# Patient Record
Sex: Female | Born: 1970
Health system: Southern US, Community
[De-identification: ages and names within clinical notes are randomized; demographics above are authoritative.]

## PROBLEM LIST (undated history)

## (undated) DIAGNOSIS — D219 Benign neoplasm of connective and other soft tissue, unspecified: Secondary | ICD-10-CM

## (undated) DIAGNOSIS — Q159 Congenital malformation of eye, unspecified: Secondary | ICD-10-CM

## (undated) DIAGNOSIS — F32A Depression, unspecified: Secondary | ICD-10-CM

## (undated) DIAGNOSIS — D649 Anemia, unspecified: Secondary | ICD-10-CM

## (undated) DIAGNOSIS — I10 Essential (primary) hypertension: Secondary | ICD-10-CM

## (undated) DIAGNOSIS — R519 Headache, unspecified: Secondary | ICD-10-CM

## (undated) DIAGNOSIS — F329 Major depressive disorder, single episode, unspecified: Secondary | ICD-10-CM

## (undated) DIAGNOSIS — R51 Headache: Secondary | ICD-10-CM

## (undated) HISTORY — DX: Headache, unspecified: R51.9

## (undated) HISTORY — DX: Congenital malformation of eye, unspecified: Q15.9

## (undated) HISTORY — DX: Depression, unspecified: F32.A

## (undated) HISTORY — DX: Anemia, unspecified: D64.9

## (undated) HISTORY — DX: Benign neoplasm of connective and other soft tissue, unspecified: D21.9

## (undated) HISTORY — DX: Essential (primary) hypertension: I10

## (undated) HISTORY — DX: Headache: R51

## (undated) HISTORY — PX: KNEE SURGERY: SHX244

## (undated) HISTORY — DX: Major depressive disorder, single episode, unspecified: F32.9

## (undated) HISTORY — PX: BREAST MASS EXCISION: SHX1267

---

## 2012-11-11 ENCOUNTER — Ambulatory Visit (INDEPENDENT_AMBULATORY_CARE_PROVIDER_SITE_OTHER): Payer: 59 | Admitting: Adult Health

## 2012-11-11 ENCOUNTER — Encounter: Payer: Self-pay | Admitting: Adult Health

## 2012-11-11 ENCOUNTER — Telehealth: Payer: Self-pay | Admitting: *Deleted

## 2012-11-11 ENCOUNTER — Other Ambulatory Visit: Payer: Self-pay | Admitting: Adult Health

## 2012-11-11 VITALS — BP 110/58 | HR 64 | Temp 98.1°F | Resp 12 | Ht 61.25 in | Wt 159.0 lb

## 2012-11-11 DIAGNOSIS — R5383 Other fatigue: Secondary | ICD-10-CM

## 2012-11-11 DIAGNOSIS — D649 Anemia, unspecified: Secondary | ICD-10-CM

## 2012-11-11 DIAGNOSIS — Z0000 Encounter for general adult medical examination without abnormal findings: Secondary | ICD-10-CM | POA: Insufficient documentation

## 2012-11-11 DIAGNOSIS — Z23 Encounter for immunization: Secondary | ICD-10-CM

## 2012-11-11 DIAGNOSIS — Z1239 Encounter for other screening for malignant neoplasm of breast: Secondary | ICD-10-CM

## 2012-11-11 DIAGNOSIS — D509 Iron deficiency anemia, unspecified: Secondary | ICD-10-CM

## 2012-11-11 DIAGNOSIS — R1013 Epigastric pain: Secondary | ICD-10-CM

## 2012-11-11 DIAGNOSIS — E611 Iron deficiency: Secondary | ICD-10-CM

## 2012-11-11 DIAGNOSIS — R5381 Other malaise: Secondary | ICD-10-CM

## 2012-11-11 LAB — LIPID PANEL
HDL: 58.3 mg/dL (ref >39.00)
Total CHOL/HDL Ratio: 3
Triglycerides: 71 mg/dL (ref 0.0–149.0)

## 2012-11-11 LAB — CBC WITH DIFFERENTIAL/PLATELET
Hemoglobin: 5.4 g/dL — CL (ref 12.0–15.0)
RBC: 3.29 Mil/uL — ABNORMAL LOW (ref 3.87–5.11)
RDW: 32.2 % — ABNORMAL HIGH (ref 11.5–14.6)
WBC: 3.9 10*3/uL — ABNORMAL LOW (ref 4.5–10.5)

## 2012-11-11 LAB — HEPATIC FUNCTION PANEL
Albumin: 3.5 g/dL (ref 3.5–5.2)
Bilirubin, Direct: 0 mg/dL (ref 0.0–0.3)
Total Protein: 7.3 g/dL (ref 6.0–8.3)

## 2012-11-11 LAB — BASIC METABOLIC PANEL
CO2: 24 mEq/L (ref 19–32)
Calcium: 9.1 mg/dL (ref 8.4–10.5)
Creatinine, Ser: 0.8 mg/dL (ref 0.4–1.2)
Glucose, Bld: 88 mg/dL (ref 70–99)

## 2012-11-11 LAB — TSH: TSH: 1.73 u[IU]/mL (ref 0.35–5.50)

## 2012-11-11 MED ORDER — ESOMEPRAZOLE MAGNESIUM 40 MG PO CPDR: 40.0000 mg | DELAYED_RELEASE_CAPSULE | Freq: Every day | ORAL | Status: DC

## 2012-11-11 MED ORDER — TETANUS-DIPHTH-ACELL PERTUSSIS 5-2.5-18.5 LF-MCG/0.5 IM SUSP: 0.5000 mL | Freq: Once | INTRAMUSCULAR | Status: DC

## 2012-11-11 NOTE — Telephone Encounter (Signed)
Critical Values:  Hemoglobin: 5.4  Hematocrit: 19.5  Platelet: 1264

## 2012-11-11 NOTE — Patient Instructions (Addendum)
   Thank you for choosing [place] at [facility] for your health care needs.  Please have your labs drawn prior to leaving the office.  The results will be available through MyChart for your convenience. Please remember to activate this. The activation code is located at the end of this form.  For your stomach - please start Nexium 40 mg every morning. Take 30 minutes prior to any food or drink.  If symptoms do not improve within 1 month please let me know.

## 2012-11-11 NOTE — Assessment & Plan Note (Signed)
No pain with deep palpation. Negative murphy's sign. Start nexium daily. If no improvement within 1 month will send for ultrasound. May need referral to GI.

## 2012-11-11 NOTE — Addendum Note (Signed)
Addended by: Chandra Batch E on: 11/11/2012 10:43 AM   Modules accepted: Orders

## 2012-11-11 NOTE — Assessment & Plan Note (Addendum)
Normal physical exam excluding breast and pelvic. Patient has appointment with [facility] tomorrow. Check labs: cbc w/diff, tsh, bmet, hepatic panel, lipids, vit d, ferritin. Request medical records from previous pcp.

## 2012-11-11 NOTE — Progress Notes (Signed)
Subjective:    Patient ID: Dana Dunlap, female    DOB: 04/09/1971, 42 y.o.   MRN: [id number]  HPI  Patient is a pleasant 42 y/o female who presents to clinic to establish care. She has recently moved to the [place] area from [place]. Patient reports having epigastric pain. She describes pain as a constant ache. She reports feeling like "the food is stuck". Pain is increased after meals. She reports that she had a barium swallow last year with negative findings. She reports episodes of n/v associated with this. She has not noticed any blood in the vomitus. Her episodes of n/v are occuring around her menstrual cycle. She has a hx of uterine fibroids. Appointment at [facility] scheduled for tomorrow. She reports stools are black. Patient takes iron supplements.    Past Medical History  Diagnosis Date  . Depression     Currently on Cymbalta  . Hypertension   . Frequent headaches     Worse during monthly menstrual cycle  . Fibroids      History reviewed. No pertinent past surgical history.   Family History  Problem Relation Age of Onset  . Hypertension Mother   . Hyperlipidemia Mother   . Cancer Father     Prostate cancer  . Hypertension Father   . Hyperlipidemia Father   . Stroke Maternal Aunt   . Stroke Paternal Aunt   . Diabetes Paternal Aunt      History   Social History  . Marital Status: Single    Spouse Name: N/A    Number of Children: N/A  . Years of Education: 16   Occupational History  . 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 Smoking status: Never Smoker   . Smokeless tobacco: Never Used  . Alcohol Use: Yes     Comment: occasional alcohol use  . Drug Use: No  . Sexually Active: Yes    Birth Control/ Protection: Condom   Other Topics Concern  . Not on file   Social History Narrative   Dana Dunlap is originally from [place]. She attended [employer] in [place], [place] where she obtained her  Scientist, water quality in Biology in 1995. 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 She is in a long term relationship with her boyfried, Dana Dunlap. They have been together for 6 years. Dana Dunlap transferred to [facility] with [employer] in January. She enjoys reading and she enjoys the outdoors. She loves to travel. 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      Review of Systems  Constitutional: Positive for fatigue. Negative for fever.  HENT: Positive for postnasal drip.   Eyes:       Eye dryness. Last eye exam 2 years ago. She is scheduling appointment for this.  Respiratory: Negative.   Cardiovascular: Negative.   Gastrointestinal: Positive for nausea, vomiting, abdominal pain and constipation. Negative for blood in stool.       Epigastric pain. Worse 1-2 hours after meals.  Endocrine: Negative.   Genitourinary: Positive for menstrual problem.       Fibroids - Appointment with [facility] tomorrow. Patient with hx of low iron s/p iron infusions.  Musculoskeletal: Negative.   Skin: Negative.   Neurological: Positive for headaches.  Hematological: Negative.   Psychiatric/Behavioral: Negative for suicidal ideas, behavioral problems, confusion, sleep disturbance, self-injury and agitation.       Takes  cymbalta for depression. Feels depression is well controlled.    BP 110/58  Pulse 64  Temp(Src) 98.1 F (36.7 C) (Oral)  Resp 12  Ht 5' 1.25" (1.556 m)  Wt 159 lb (72.122 kg)  BMI 29.79 kg/m2  SpO2 98%  LMP 11/01/2012    Objective:   Physical Exam  Constitutional: She is oriented to person, place, and time. She appears well-developed and well-nourished. No distress.  HENT:  Head: Normocephalic and atraumatic.  Right Ear: External ear normal.  Left Ear: External ear normal.  Nose: Nose normal.  Mouth/Throat: Oropharynx is clear and moist.  Eyes: Conjunctivae are normal. Pupils are equal, round, and reactive to light.   Neck: Normal range of motion. Neck supple. No tracheal deviation present. No thyromegaly present.  Cardiovascular: Normal rate, regular rhythm, normal heart sounds and intact distal pulses.  Exam reveals no gallop and no friction rub.   No murmur heard. Pulmonary/Chest: Effort normal and breath sounds normal. No respiratory distress. She has no wheezes. She has no rales.  Abdominal: Soft. Bowel sounds are normal. She exhibits no distension. There is no tenderness. There is no rebound and no guarding.  Musculoskeletal: Normal range of motion. She exhibits no edema and no tenderness.  Lymphadenopathy:    She has no cervical adenopathy.  Neurological: She is alert and oriented to person, place, and time. She has normal reflexes. No cranial nerve deficit. Coordination normal.  Skin: Skin is warm and dry.  Psychiatric: She has a normal mood and affect. Her behavior is normal. Judgment and thought content normal.       Assessment & Plan:

## 2012-11-11 NOTE — Telephone Encounter (Signed)
Pt notified of results. States her hemoglobin has been that low in the past, very fatigued. States her last iron infusion was about 3 years ago. Notified of referral to hematologist per Raquel Rey. NP.

## 2012-11-12 ENCOUNTER — Telehealth: Payer: Self-pay | Admitting: *Deleted

## 2012-11-12 ENCOUNTER — Other Ambulatory Visit: Payer: Self-pay | Admitting: Adult Health

## 2012-11-12 DIAGNOSIS — R1013 Epigastric pain: Secondary | ICD-10-CM

## 2012-11-12 LAB — HM PAP SMEAR: HM Pap smear: NEGATIVE

## 2012-11-12 LAB — VITAMIN D 25 HYDROXY (VIT D DEFICIENCY, FRACTURES): Vit D, 25-Hydroxy: 32 ng/mL (ref 30–89)

## 2012-11-12 NOTE — Telephone Encounter (Signed)
Labs faxed to [facility].

## 2012-11-12 NOTE — Telephone Encounter (Signed)
Message copied by Dema Severin on Tue Nov 12, 2012 12:59 PM ------      Message from: Novella Olive      Created: Tue Nov 12, 2012 12:48 PM       [facility] will be calling regarding appt for urgent transfusion for patient. Dr. Rockie Neighbours needs copy of all labs. ------

## 2012-11-14 ENCOUNTER — Ambulatory Visit: Payer: Self-pay | Admitting: Hematology and Oncology

## 2012-11-14 LAB — CBC CANCER CENTER
Basophil #: 0 x10 3/mm (ref 0.0–0.1)
Basophil %: 1.2 %
Eosinophil %: 2.2 %
HCT: 21.8 % — ABNORMAL LOW (ref 35.0–47.0)
HGB: 6 g/dL — ABNORMAL LOW (ref 12.0–16.0)
Lymphocyte #: 1.5 x10 3/mm (ref 1.0–3.6)
Lymphocyte %: 39.9 %
MCV: 58 fL — ABNORMAL LOW (ref 80–100)
Monocyte %: 10.3 %
Neutrophil %: 46.4 %
Platelet: 682 x10 3/mm — ABNORMAL HIGH (ref 150–440)
RBC: 3.78 10*6/uL — ABNORMAL LOW (ref 3.80–5.20)
WBC: 3.8 x10 3/mm (ref 3.6–11.0)

## 2012-11-14 LAB — FERRITIN: Ferritin (ARMC): 14 ng/mL (ref 8–388)

## 2012-11-14 LAB — IRON AND TIBC
Iron Bind.Cap.(Total): 464 ug/dL — ABNORMAL HIGH (ref 250–450)
Unbound Iron-Bind.Cap.: 446 ug/dL

## 2012-11-15 ENCOUNTER — Telehealth: Payer: Self-pay | Admitting: *Deleted

## 2012-11-15 NOTE — Telephone Encounter (Signed)
Patient left a message on Becky voicemail stating she went to the hospital this morning they gave her some IV iron and she is doing better just wanted to let you know this. She has to go back on Monday

## 2012-12-02 ENCOUNTER — Ambulatory Visit: Payer: Self-pay | Admitting: Hematology and Oncology

## 2012-12-10 LAB — CBC CANCER CENTER
Basophil #: 0.1 x10 3/mm (ref 0.0–0.1)
Eosinophil #: 0.1 x10 3/mm (ref 0.0–0.7)
Eosinophil %: 1.5 %
HCT: 32 % — ABNORMAL LOW (ref 35.0–47.0)
HGB: 10 g/dL — ABNORMAL LOW (ref 12.0–16.0)
Lymphocyte #: 1.4 x10 3/mm (ref 1.0–3.6)
MCH: 22.4 pg — ABNORMAL LOW (ref 26.0–34.0)
MCHC: 31.4 g/dL — ABNORMAL LOW (ref 32.0–36.0)
Monocyte %: 7.8 %
Neutrophil %: 49.7 %
Platelet: 628 x10 3/mm — ABNORMAL HIGH (ref 150–440)
WBC: 3.6 x10 3/mm (ref 3.6–11.0)

## 2013-01-01 ENCOUNTER — Ambulatory Visit: Payer: Self-pay | Admitting: Hematology and Oncology

## 2013-03-26 ENCOUNTER — Encounter: Payer: Self-pay | Admitting: Adult Health

## 2013-03-26 ENCOUNTER — Ambulatory Visit (INDEPENDENT_AMBULATORY_CARE_PROVIDER_SITE_OTHER): Payer: 59 | Admitting: Adult Health

## 2013-03-26 VITALS — BP 140/82 | HR 86 | Temp 97.6°F | Resp 14 | Wt 159.0 lb

## 2013-03-26 DIAGNOSIS — R030 Elevated blood-pressure reading, without diagnosis of hypertension: Secondary | ICD-10-CM | POA: Insufficient documentation

## 2013-03-26 DIAGNOSIS — Z79899 Other long term (current) drug therapy: Secondary | ICD-10-CM | POA: Insufficient documentation

## 2013-03-26 DIAGNOSIS — I10 Essential (primary) hypertension: Secondary | ICD-10-CM

## 2013-03-26 DIAGNOSIS — J040 Acute laryngitis: Secondary | ICD-10-CM | POA: Insufficient documentation

## 2013-03-26 MED ORDER — LISINOPRIL 5 MG PO TABS: 5.0000 mg | ORAL_TABLET | Freq: Every day | ORAL | Status: DC

## 2013-03-26 MED ORDER — DULOXETINE HCL 30 MG PO CPEP: 90.0000 mg | ORAL_CAPSULE | Freq: Every day | ORAL | Status: DC

## 2013-03-26 NOTE — Assessment & Plan Note (Signed)
Start Lisinopril 5 mg. Check K and creatinine on Monday or Tuesday. Previous bmet normal

## 2013-03-26 NOTE — Patient Instructions (Signed)
  Medication refill for Cymbalta sent to [facility].  I am starting you on Lisinopril 5 mg daily for your blood pressure. Monitor your blood pressure daily for 1 week and let me know what your readings are. You can send me a message through MyChart. Below is instructions on how to activate your account.  On Monday or Tuesday of next week I want you to have your labs drawn at [facility] to check your potassium and creatinine (kidney function). We do this routinely when starting Lisinopril.  For your voice - you need voice rest, fluids, gargle with salt water solution. You can try tea with honey to soothe your throat.  If no improvement within 1 week please let me know and I will refer you to ENT.

## 2013-03-26 NOTE — Progress Notes (Signed)
Pre visit review using our clinic review tool, if applicable. No additional management support is needed unless otherwise documented below in the visit note. 

## 2013-03-26 NOTE — Assessment & Plan Note (Signed)
Voice rest, fluids, tea with honey. May gargle with salt water solution. If no improvement within 1 week will refer to ENT.

## 2013-03-26 NOTE — Progress Notes (Signed)
   Subjective:    Patient ID: Dana Dunlap, female    DOB: 04/09/1971, 42 y.o.   MRN: [id number]  HPI  Patient is a very pleasant 42 y/o female who presents to clinic with the follow concerns:  1. Laryngitis - She reports having a sore throat last week and not feeling well. These symptoms have all improved; however, she has lost her voice.  2. Elevated B/P - Patient reports that in [place] she was taking b/p medication (bystolic). She stopped medication because she felt she did not need it. She has noticed her b/p slightly elevated.  3. Needs refills on Cymbalta through [facility].   Current Outpatient Prescriptions on File Prior to Visit  Medication Sig Dispense Refill  . esomeprazole (NEXIUM) 40 MG capsule Take 1 capsule (40 mg total) by mouth daily.  30 capsule  3  . Multiple Vitamin (MULTIVITAMIN) tablet Take 1 tablet by mouth daily. Isotonic vitamins       Current Facility-Administered Medications on File Prior to Visit  Medication Dose Route Frequency Provider Last Rate Last Dose  . TDaP (BOOSTRIX) injection 0.5 mL  0.5 mL Intramuscular Once Loriene Taunton, NP        Review of Systems  Constitutional: Negative for fever, chills and fatigue.  HENT: Positive for voice change. Negative for congestion, postnasal drip and rhinorrhea.   Respiratory: Negative.   Cardiovascular: Negative.   Neurological: Negative.   Psychiatric/Behavioral: Negative.        Objective:   Physical Exam  Constitutional: She is oriented to person, place, and time. She appears well-developed and well-nourished. No distress.  HENT:  Head: Normocephalic and atraumatic.  Mouth/Throat: Oropharynx is clear and moist. No oropharyngeal exudate.  Cardiovascular: Normal rate, regular rhythm, normal heart sounds and intact distal pulses.  Exam reveals no gallop and no friction rub.   No murmur heard. Pulmonary/Chest: Effort normal and breath sounds normal. No respiratory distress. She has no wheezes. She has no rales.    Lymphadenopathy:    She has cervical adenopathy.  Neurological: She is alert and oriented to person, place, and time.  Skin: Skin is warm and dry.  Psychiatric: She has a normal mood and affect. Her behavior is normal. Judgment and thought content normal.          Assessment & Plan:

## 2013-03-26 NOTE — Assessment & Plan Note (Signed)
Refill for Cymbalta. Start Lisinopril 5 mg daily. Monitor b/p daily and report readings. Check potassium and creatinine on Monday or Tuesday. Form for [facility] provided.

## 2013-05-19 ENCOUNTER — Telehealth: Payer: Self-pay | Admitting: Emergency Medicine

## 2013-05-19 ENCOUNTER — Other Ambulatory Visit: Payer: Self-pay | Admitting: Adult Health

## 2013-05-19 MED ORDER — NEBIVOLOL HCL 5 MG PO TABS: 5.0000 mg | ORAL_TABLET | Freq: Every day | ORAL | Status: DC

## 2013-05-19 NOTE — Telephone Encounter (Signed)
Patient is wanting a new blood pressure medication. She stated she did not want a "cheap" blood pressure medication. She wants all meds sent to [facility], [facility] location. Pt wants to go back on the bystolic, name brand medication, doesn't care about the costs. If you have any questions, please give her a call. She is out of medication as well.

## 2013-05-19 NOTE — Progress Notes (Signed)
Sent in prescription of bystolic to [facility] with no refills. She will need to be seen prior to refilling medication. Change in medication requiring evaluation for effectiveness.

## 2013-07-24 ENCOUNTER — Ambulatory Visit: Payer: 59 | Admitting: Adult Health

## 2013-07-28 ENCOUNTER — Ambulatory Visit (INDEPENDENT_AMBULATORY_CARE_PROVIDER_SITE_OTHER): Payer: 59 | Admitting: Adult Health

## 2013-07-28 ENCOUNTER — Encounter: Payer: Self-pay | Admitting: Adult Health

## 2013-07-28 ENCOUNTER — Telehealth: Payer: Self-pay | Admitting: *Deleted

## 2013-07-28 VITALS — BP 120/58 | HR 63 | Temp 98.2°F | Resp 14 | Wt 159.0 lb

## 2013-07-28 DIAGNOSIS — D500 Iron deficiency anemia secondary to blood loss (chronic): Secondary | ICD-10-CM | POA: Insufficient documentation

## 2013-07-28 DIAGNOSIS — D259 Leiomyoma of uterus, unspecified: Secondary | ICD-10-CM

## 2013-07-28 DIAGNOSIS — D509 Iron deficiency anemia, unspecified: Secondary | ICD-10-CM

## 2013-07-28 LAB — CBC WITH DIFFERENTIAL/PLATELET
BASOS ABS: 0.1 10*3/uL (ref 0.0–0.1)
Basophils Relative: 0.9 % (ref 0.0–3.0)
EOS PCT: 4.9 % (ref 0.0–5.0)
Eosinophils Absolute: 0.3 10*3/uL (ref 0.0–0.7)
HCT: 19.6 % — CL (ref 36.0–46.0)
Hemoglobin: 5.5 g/dL — CL (ref 12.0–15.0)
Lymphocytes Relative: 26.5 % (ref 12.0–46.0)
Lymphs Abs: 1.4 10*3/uL (ref 0.7–4.0)
MCHC: 28.3 g/dL — ABNORMAL LOW (ref 30.0–36.0)
MONO ABS: 0.2 10*3/uL (ref 0.1–1.0)
MONOS PCT: 3.8 % (ref 3.0–12.0)
NEUTROS PCT: 63.9 % (ref 43.0–77.0)
Neutro Abs: 3.4 10*3/uL (ref 1.4–7.7)
PLATELETS: 739 10*3/uL — AB (ref 150.0–400.0)
RBC: 3.36 Mil/uL — ABNORMAL LOW (ref 3.87–5.11)
RDW: 23.1 % — AB (ref 11.5–14.6)
WBC: 5.4 10*3/uL (ref 4.5–10.5)

## 2013-07-28 LAB — IRON: IRON: 11 ug/dL — AB (ref 42–145)

## 2013-07-28 LAB — FERRITIN: FERRITIN: 3.6 ng/mL — AB (ref 10.0–291.0)

## 2013-07-28 NOTE — Telephone Encounter (Signed)
Agree with POC

## 2013-07-28 NOTE — Progress Notes (Signed)
Patient ID: Dana Dunlap, female   DOB: 04/09/1971, 43 y.o.   MRN: [id number]   Subjective:    Patient ID: Dana Dunlap, female    DOB: 04/09/1971, 43 y.o.   MRN: [id number]  HPI  Pt is a very pleasant 43 y/o female with hx of fibroids who presents to clinic wanting evaluation to have the fibroids removed. She has experienced significant anemia and iron def 2/2 these fibroids. She is s/p iron infusion in the past. She saw an OB/GYN at [facility] but did not appreciate her bedside manner. She does not wish to return to that practice. Pt has seen another OB/GYN in [place], Dr. Linton Rump. She started her on a study but Dovie has been struggling with this for too long and wants to have them removed. She has an appointment with Dr. Linton Rump on Wednesday morning. She wanted me to see her today in the event that Dr. Linton Rump does not want to do the surgery so that I can refer her to another GYN. Her symptoms are worse during her menstrual cycle. She experiences considerable bleeding and nausea. She has also been feeling slightly fatigued lately.   Past Medical History  Diagnosis Date  . Depression     Currently on Cymbalta  . Hypertension   . Frequent headaches     Worse during monthly menstrual cycle  . Fibroids     Current Outpatient Prescriptions on File Prior to Visit  Medication Sig Dispense Refill  . DULoxetine (CYMBALTA) 30 MG capsule Take 3 capsules (90 mg total) by mouth daily.  270 capsule  3  . esomeprazole (NEXIUM) 40 MG capsule Take 1 capsule (40 mg total) by mouth daily.  30 capsule  3  . Multiple Vitamin (MULTIVITAMIN) tablet Take 1 tablet by mouth daily. Isotonic vitamins      . nebivolol (BYSTOLIC) 5 MG tablet Take 1 tablet (5 mg total) by mouth daily.  30 tablet  0   Current Facility-Administered Medications on File Prior to Visit  Medication Dose Route Frequency Provider Last Rate Last Dose  . TDaP (BOOSTRIX) injection 0.5 mL  0.5 mL Intramuscular Once Raquel Rey, NP         Review of  Systems  Constitutional: Positive for fatigue.  HENT: Negative.   Eyes: Negative.   Respiratory: Negative.   Cardiovascular: Negative.   Gastrointestinal: Negative.   Endocrine: Negative.   Genitourinary: Positive for menstrual problem (large fibroids causing menorrhagia).  Musculoskeletal: Negative.   Skin: Negative.   Allergic/Immunologic: Negative.   Neurological: Negative.   Hematological: Negative.   Psychiatric/Behavioral: Negative.        Objective:  BP 120/58  Pulse 63  Temp(Src) 98.2 F (36.8 C) (Oral)  Resp 14  Wt 159 lb (72.122 kg)  SpO2 99%  LMP 07/22/2013   Physical Exam  Constitutional: She is oriented to person, place, and time. No distress.  HENT:  Head: Normocephalic and atraumatic.  Eyes: Conjunctivae and EOM are normal.  Neck: Normal range of motion. Neck supple.  Cardiovascular: Normal rate, regular rhythm, normal heart sounds and intact distal pulses.  Exam reveals no gallop and no friction rub.   No murmur heard. Pulmonary/Chest: Effort normal and breath sounds normal. No respiratory distress. She has no wheezes. She has no rales.  Genitourinary:  Abdomen appears as if she is pregnant. No tenderness to palpate.  Musculoskeletal: Normal range of motion.  Neurological: She is alert and oriented to person, place, and time. She has normal reflexes. Coordination normal.  Skin:  Skin is warm and dry.  Psychiatric: She has a normal mood and affect. Her behavior is normal. Judgment and thought content normal.       Assessment & Plan:   1. Uterine fibroid Appointment with GYN on Wednesday morning. She wants the fibroids removed. Caused significant menstrual bleeding and nausea. She has been dealing with these fibroids for many years. She is ready to have them removed.  2. Anemia, iron deficiency Hx of iron def anemia 2/2 large fibroids. Check labs.  - CBC with Differential - Ferritin - Iron

## 2013-07-28 NOTE — Telephone Encounter (Signed)
  Hemoglobin: 5.5  HCT: 19.6

## 2013-07-28 NOTE — Progress Notes (Signed)
Pre visit review using our clinic review tool, if applicable. No additional management support is needed unless otherwise documented below in the visit note. 

## 2013-07-28 NOTE — Telephone Encounter (Signed)
Notified pt of Hemoglobin level, contacted [facility] and scheduled a blood transfusion for tomorrow morning at 8:15 at the [facility] office.  Notified pt of appt at the [facility], pt verbalized understanding

## 2013-07-29 ENCOUNTER — Ambulatory Visit: Payer: Self-pay | Admitting: Hematology and Oncology

## 2013-07-29 LAB — CBC CANCER CENTER
Basophil #: 0.1 x10 3/mm (ref 0.0–0.1)
Basophil %: 0.9 %
EOS PCT: 5.4 %
Eosinophil #: 0.3 x10 3/mm (ref 0.0–0.7)
HCT: 19.8 % — ABNORMAL LOW (ref 35.0–47.0)
HGB: 5.8 g/dL — ABNORMAL LOW (ref 12.0–16.0)
LYMPHS ABS: 1.4 x10 3/mm (ref 1.0–3.6)
Lymphocyte %: 25.9 %
MCH: 16.5 pg — ABNORMAL LOW (ref 26.0–34.0)
MCHC: 29.5 g/dL — ABNORMAL LOW (ref 32.0–36.0)
MCV: 56 fL — ABNORMAL LOW (ref 80–100)
MONO ABS: 0.2 x10 3/mm (ref 0.2–0.9)
Monocyte %: 4.4 %
NEUTROS PCT: 63.4 %
Neutrophil #: 3.4 x10 3/mm (ref 1.4–6.5)
PLATELETS: 820 x10 3/mm — AB (ref 150–440)
RBC: 3.54 10*6/uL — ABNORMAL LOW (ref 3.80–5.20)
RDW: 22.4 % — ABNORMAL HIGH (ref 11.5–14.5)
WBC: 5.4 x10 3/mm (ref 3.6–11.0)

## 2013-08-01 ENCOUNTER — Ambulatory Visit: Payer: Self-pay | Admitting: Hematology and Oncology

## 2013-08-21 DIAGNOSIS — N920 Excessive and frequent menstruation with regular cycle: Secondary | ICD-10-CM | POA: Insufficient documentation

## 2013-08-22 ENCOUNTER — Telehealth: Payer: Self-pay

## 2013-08-22 ENCOUNTER — Other Ambulatory Visit: Payer: Self-pay | Admitting: Adult Health

## 2013-08-22 DIAGNOSIS — D219 Benign neoplasm of connective and other soft tissue, unspecified: Secondary | ICD-10-CM

## 2013-08-22 NOTE — Telephone Encounter (Signed)
Referring to Dr. Nicholas Lose

## 2013-08-22 NOTE — Telephone Encounter (Signed)
Notified patient a referal has been ordered to see Dr. Maudry Diego. Patient states she is pleased with [place] taking care of her problem in a timely manner.

## 2013-08-22 NOTE — Telephone Encounter (Signed)
Spoke with Patient about a recent office visit she had with a GYN provider. Patient was not happy with her service from Dr. Rosana Berger. We did not refer her to him. Patient would like a 2nd on Fibroid removal with a GYN her PCP recommends. She does not want to be seen by Dr. Rosana Berger again he was very rude to patient at her visit yesterday. Please advise.

## 2013-11-06 ENCOUNTER — Encounter: Payer: Self-pay | Admitting: Adult Health

## 2013-12-15 ENCOUNTER — Ambulatory Visit (INDEPENDENT_AMBULATORY_CARE_PROVIDER_SITE_OTHER): Payer: 59 | Admitting: Adult Health

## 2013-12-15 ENCOUNTER — Encounter: Payer: Self-pay | Admitting: Adult Health

## 2013-12-15 ENCOUNTER — Other Ambulatory Visit: Payer: Self-pay

## 2013-12-15 VITALS — BP 121/82 | HR 83 | Temp 98.4°F | Resp 14 | Ht 61.25 in | Wt 155.8 lb

## 2013-12-15 DIAGNOSIS — J069 Acute upper respiratory infection, unspecified: Secondary | ICD-10-CM

## 2013-12-15 MED ORDER — DULOXETINE HCL 30 MG PO CPEP: 90.0000 mg | ORAL_CAPSULE | Freq: Every day | ORAL | Status: DC

## 2013-12-15 MED ORDER — AMOXICILLIN-POT CLAVULANATE 875-125 MG PO TABS: 1.0000 | ORAL_TABLET | Freq: Two times a day (BID) | ORAL | Status: DC

## 2013-12-15 MED ORDER — GUAIFENESIN-CODEINE 100-10 MG/5ML PO SOLN: 5.0000 mL | Freq: Three times a day (TID) | ORAL | Status: DC | PRN

## 2013-12-15 NOTE — Progress Notes (Signed)
Patient ID: Dana Dunlap, female   DOB: 04/09/1971, 43 y.o.   MRN: [id number]   Subjective:    Patient ID: Dana Dunlap, female    DOB: 04/09/1971, 43 y.o.   MRN: [id number]  HPI  Pt presents to clinic with cough, rhinorrhea, chills, laryngitis. Denies fever. Symptoms started ~ 1 week ago. She has been taking NyQuil without any improvement. Reports symptoms started with sinus drainage, post nasal drip, rhinorrhea. Then progressed to scratchy throat and cough now keeping her up at night.   Past Medical History  Diagnosis Date  . Depression     Currently on Cymbalta  . Hypertension   . Frequent headaches     Worse during monthly menstrual cycle  . Fibroids     Current Outpatient Prescriptions on File Prior to Visit  Medication Sig Dispense Refill  . DULoxetine (CYMBALTA) 30 MG capsule Take 3 capsules (90 mg total) by mouth daily.  270 capsule  3  . esomeprazole (NEXIUM) 40 MG capsule Take 1 capsule (40 mg total) by mouth daily.  30 capsule  3  . Multiple Vitamin (MULTIVITAMIN) tablet Take 1 tablet by mouth daily. Isotonic vitamins      . nebivolol (BYSTOLIC) 5 MG tablet Take 1 tablet (5 mg total) by mouth daily.  30 tablet  0   Current Facility-Administered Medications on File Prior to Visit  Medication Dose Route Frequency Provider Last Rate Last Dose  . TDaP (BOOSTRIX) injection 0.5 mL  0.5 mL Intramuscular Once Raquel Dagoberto Ligas, NP         Review of Systems  Constitutional: Positive for chills. Negative for fever.  HENT: Positive for congestion, postnasal drip, rhinorrhea and voice change. Negative for sore throat.   Respiratory: Positive for cough. Negative for shortness of breath and wheezing.   All other systems reviewed and are negative.      Objective:  BP 121/82  Pulse 83  Temp(Src) 98.4 F (36.9 C) (Oral)  Resp 14  Wt 155 lb 12 oz (70.648 kg)  SpO2 99%   Physical Exam  Constitutional: She is oriented to person, place, and time. She appears well-developed and  well-nourished. No distress.  HENT:  Head: Normocephalic and atraumatic.  Right Ear: External ear normal.  Left Ear: External ear normal.  Mouth/Throat: No oropharyngeal exudate.  Cardiovascular: Normal rate, regular rhythm and normal heart sounds.  Exam reveals no gallop.   No murmur heard. Pulmonary/Chest: Effort normal and breath sounds normal. No respiratory distress. She has no wheezes. She has no rales.  Lymphadenopathy:    She has cervical adenopathy.  Neurological: She is alert and oriented to person, place, and time.  Psychiatric: She has a normal mood and affect. Her behavior is normal. Judgment and thought content normal.      Assessment & Plan:   1. Acute upper respiratory infections of unspecified site Start Augmentin bid x 7 days. Robitussin AC for cough. Letter for work. Out until Thursday. Voice rest. Drink plenty of fluids RTC if no improvement within 4-5 days or sooner if necessary.

## 2013-12-15 NOTE — Patient Instructions (Signed)
  Start Augmentin 1 tablet twice a day for 7 days.  Robitussin AC for cough. This has codeine. Do not drive while taking this medication as it will cause sedation.  Voice rest for 24 hours.  Drink plenty of fluids to stay hydrated.  Call if no improvement within 4-5 days or sooner if necessary.

## 2013-12-15 NOTE — Progress Notes (Signed)
Pre visit review using our clinic review tool, if applicable. No additional management support is needed unless otherwise documented below in the visit note. 

## 2013-12-18 ENCOUNTER — Other Ambulatory Visit: Payer: Self-pay | Admitting: *Deleted

## 2013-12-18 MED ORDER — DULOXETINE HCL 30 MG PO CPEP: 90.0000 mg | ORAL_CAPSULE | Freq: Every day | ORAL | Status: DC

## 2013-12-23 ENCOUNTER — Telehealth: Payer: Self-pay | Admitting: *Deleted

## 2013-12-23 NOTE — Telephone Encounter (Signed)
Pt called in, yelling about her prescription that has not been done to [facility], states she has been out of her medication x 1 month and our office has "dropped the ball" and wanted to talk to Raquel. Unable to calm patient down, continued to yell throughout the whole conversation. Was able to get from her that she is referring to her Cymbalta. In Epic, shows this Rx was sent twice to [facility], confirmed receipt both times. I asked patient if she had contacted [facility], continued to yell that she called them daily in regards to this prescription and they were to contact us and why was our office treating her this way and that she is very upset and wanted to speak to Raquel or another provider. Offered to send the prescription to a local pharmacy until her mail order came in. Unable to communicate with patient with all the yelling. Patient hung up. I then called [facility] to discuss problem. Spoke to pharmacist, states they had the prescription, but her insurance will not cover 3- 30 mg tablets daily as it is written, would need to be changed to 1- 30 mg tablet and 1- 60 mg tablet daily. Notified pharmacist that our office was never contacted, but gave verbal ok to change prescription so that insurance would cover. Pt called back, notified that I spoke with [facility] and that prescription has been fixed, advised of change, pt said ok and hung up.   Sent to Dr. Derrel Nip and office manager as an Juluis Rainier.

## 2014-01-21 ENCOUNTER — Ambulatory Visit (INDEPENDENT_AMBULATORY_CARE_PROVIDER_SITE_OTHER): Payer: 59 | Admitting: Podiatry

## 2014-01-21 ENCOUNTER — Encounter: Payer: Self-pay | Admitting: Podiatry

## 2014-01-21 VITALS — BP 123/69 | HR 71 | Resp 16 | Ht 60.0 in | Wt 155.0 lb

## 2014-01-21 DIAGNOSIS — L603 Nail dystrophy: Secondary | ICD-10-CM

## 2014-01-21 DIAGNOSIS — M722 Plantar fascial fibromatosis: Secondary | ICD-10-CM

## 2014-01-21 DIAGNOSIS — Q6650 Congenital pes planus, unspecified foot: Secondary | ICD-10-CM

## 2014-01-21 NOTE — Progress Notes (Signed)
   Subjective:    Patient ID: Dana Dunlap, female    DOB: 04/09/1971, 43 y.o.   MRN: [id number]  HPI Comments: i have a brown 4th toenail on my left foot. The nail does not hurt. Its been like this for years and its getting worse. i scrub the toe, topical otc stuff that did not work. i have pedicures.     Review of Systems  Skin:       Change in nails  All other systems reviewed and are negative.      Objective:   Physical Exam: I have reviewed her past mental history medications allergies surgeries social history. Pulses are strongly palpable bilateral. Neurologic sensorium is intact per since once the monofilament. Due to reflexes are intact bilateral muscle strength + over 5 dorsiflexors plantar flexors inverters everters all intrinsic musculature is intact. Orthopedic evaluation demonstrates all joints distal to the ankle a full range of motion without crepitation. Mild HAV deformity and hammertoe deformities are bilateral but asymptomatic. Cutaneous evaluation demonstrates is supple well hydrated cutis she does have discoloration of the nail plate fourth digit of the left foot this does not appear to be fungal in nature however this cannot be ruled out. I do believe this is more consistent with Melanonychia moderate to severe pes planus bilateral. Mild tenderness on palpation of the plantar fascia at its calcaneal insertion site.        Assessment & Plan:  Assessment: Nail dystrophy fourth digit left foot. Rule out onychomycosis. Pes planus bilateral. Plantar fasciitis.  Plan: Nail samples taken today and sent for pathologic vacation. She was scanned for orthotics.

## 2014-02-09 ENCOUNTER — Telehealth: Payer: Self-pay | Admitting: *Deleted

## 2014-02-09 NOTE — Telephone Encounter (Signed)
Called to let pt know results are in and negative for fungus. Per dr Milinda Pointer pt will need nuvail. Tried calling pt and could not leave voicemail. Voicemail is full.

## 2014-02-11 ENCOUNTER — Ambulatory Visit: Payer: 59 | Admitting: Podiatry

## 2014-02-16 ENCOUNTER — Encounter: Payer: Self-pay | Admitting: Podiatry

## 2014-03-04 ENCOUNTER — Ambulatory Visit (INDEPENDENT_AMBULATORY_CARE_PROVIDER_SITE_OTHER): Payer: 59 | Admitting: Podiatry

## 2014-03-04 VITALS — BP 99/53 | HR 90 | Resp 16

## 2014-03-04 DIAGNOSIS — L603 Nail dystrophy: Secondary | ICD-10-CM

## 2014-03-04 NOTE — Patient Instructions (Signed)

## 2014-03-05 NOTE — Progress Notes (Signed)
She presents today to pick up orthotics and she would also like the results of her pathology report.  Objective: Vital signs are stable she is alert and oriented 3 pulses remain palpable bilateral. Pathology report was negative for fungus. Nail dystrophy was the diagnosis.  Assessment: Pes planus plantar fasciitis bilateral. Nail dystrophy.  Plan: Dispensed orthotics and will follow up with her in 1 month.

## 2014-04-06 ENCOUNTER — Ambulatory Visit: Payer: 59 | Admitting: Podiatry

## 2014-04-10 ENCOUNTER — Encounter: Payer: Self-pay | Admitting: Nurse Practitioner

## 2014-04-10 ENCOUNTER — Ambulatory Visit (INDEPENDENT_AMBULATORY_CARE_PROVIDER_SITE_OTHER): Payer: 59 | Admitting: Nurse Practitioner

## 2014-04-10 ENCOUNTER — Observation Stay: Payer: Self-pay | Admitting: Obstetrics & Gynecology

## 2014-04-10 ENCOUNTER — Telehealth: Payer: Self-pay | Admitting: *Deleted

## 2014-04-10 VITALS — BP 130/60 | HR 78 | Temp 98.2°F | Resp 12 | Ht 61.0 in | Wt 155.1 lb

## 2014-04-10 DIAGNOSIS — Z87898 Personal history of other specified conditions: Secondary | ICD-10-CM

## 2014-04-10 DIAGNOSIS — D509 Iron deficiency anemia, unspecified: Secondary | ICD-10-CM

## 2014-04-10 DIAGNOSIS — R000 Tachycardia, unspecified: Secondary | ICD-10-CM

## 2014-04-10 DIAGNOSIS — R0600 Dyspnea, unspecified: Secondary | ICD-10-CM

## 2014-04-10 DIAGNOSIS — R0609 Other forms of dyspnea: Secondary | ICD-10-CM

## 2014-04-10 DIAGNOSIS — D259 Leiomyoma of uterus, unspecified: Secondary | ICD-10-CM

## 2014-04-10 LAB — TSH: TSH: 2.46 u[IU]/mL (ref 0.35–4.50)

## 2014-04-10 LAB — CBC WITH DIFFERENTIAL/PLATELET
BASOS ABS: 0 10*3/uL (ref 0.0–0.1)
BASOS ABS: 0 10*3/uL (ref 0.0–0.1)
Basophil %: 1.5 %
Basophils Relative: 0 % (ref 0.0–3.0)
EOS PCT: 3.7 % (ref 0.0–5.0)
Eosinophil #: 0.1 10*3/uL (ref 0.0–0.7)
Eosinophil %: 2.7 %
Eosinophils Absolute: 0.1 10*3/uL (ref 0.0–0.7)
HCT: 18.2 % — CL (ref 36.0–46.0)
HCT: 20.2 % — ABNORMAL LOW (ref 35.0–47.0)
HGB: 5.5 g/dL — ABNORMAL LOW (ref 12.0–16.0)
Hemoglobin: 5.1 g/dL — CL (ref 12.0–15.0)
LYMPHS ABS: 1.1 10*3/uL (ref 0.7–4.0)
LYMPHS PCT: 34 % (ref 12.0–46.0)
LYMPHS PCT: 46.5 %
Lymphocyte #: 1.4 10*3/uL (ref 1.0–3.6)
MCH: 15.8 pg — ABNORMAL LOW (ref 26.0–34.0)
MCHC: 27.2 g/dL — ABNORMAL LOW (ref 32.0–36.0)
MCV: 57.2 fl — ABNORMAL LOW (ref 78.0–100.0)
MCV: 58 fL — ABNORMAL LOW (ref 80–100)
MONOS PCT: 9 %
Monocyte #: 0.3 x10 3/mm (ref 0.2–0.9)
Monocytes Absolute: 0.4 10*3/uL (ref 0.1–1.0)
Monocytes Relative: 12.6 % — ABNORMAL HIGH (ref 3.0–12.0)
NEUTROS ABS: 1.2 10*3/uL — AB (ref 1.4–6.5)
NEUTROS PCT: 40.3 %
Neutro Abs: 1.6 10*3/uL (ref 1.4–7.7)
Neutrophils Relative %: 49.7 % (ref 43.0–77.0)
PLATELETS: 687 10*3/uL — AB (ref 150.0–400.0)
PLATELETS: 569 10*3/uL — AB (ref 150–440)
RBC: 3.19 Mil/uL — ABNORMAL LOW (ref 3.87–5.11)
RBC: 3.48 10*6/uL — ABNORMAL LOW (ref 3.80–5.20)
RDW: 26 % — ABNORMAL HIGH (ref 11.5–15.5)
RDW: 26.8 % — AB (ref 11.5–14.5)
WBC: 3.2 10*3/uL — AB (ref 4.0–10.5)
WBC: 3 10*3/uL — ABNORMAL LOW (ref 3.6–11.0)

## 2014-04-10 LAB — COMPREHENSIVE METABOLIC PANEL
ALK PHOS: 48 U/L
AST: 39 U/L — AB (ref 15–37)
Albumin: 3.9 g/dL (ref 3.4–5.0)
Anion Gap: 9 (ref 7–16)
BILIRUBIN TOTAL: 0.3 mg/dL (ref 0.2–1.0)
BUN: 7 mg/dL (ref 7–18)
CREATININE: 0.87 mg/dL (ref 0.60–1.30)
Calcium, Total: 8.8 mg/dL (ref 8.5–10.1)
Chloride: 108 mmol/L — ABNORMAL HIGH (ref 98–107)
Co2: 22 mmol/L (ref 21–32)
EGFR (African American): >60
EGFR (Non-African Amer.): >60
Glucose: 93 mg/dL (ref 65–99)
Osmolality: 275 (ref 275–301)
POTASSIUM: 3.8 mmol/L (ref 3.5–5.1)
SGPT (ALT): 15 U/L
SODIUM: 139 mmol/L (ref 136–145)
Total Protein: 8.3 g/dL — ABNORMAL HIGH (ref 6.4–8.2)

## 2014-04-10 LAB — EKG 12-LEAD

## 2014-04-10 NOTE — Patient Instructions (Signed)
We will follow up in 4 weeks. Please visit lab before leaving today.  If you sign up for MyChart you will have access to your medical information as well as labs, able to make appointments, and ask questions. This is a great tool to use!    Stress Stress-related medical problems are becoming increasingly common. The body has a built-in physical response to stressful situations. Faced with pressure, challenge or danger, we need to react quickly. Our bodies release hormones such as cortisol and adrenaline to help do this. These hormones are part of the "fight or flight" response and affect the metabolic rate, heart rate and blood pressure, resulting in a heightened, stressed state that prepares the body for optimum performance in dealing with a stressful situation. It is likely that early man required these mechanisms to stay alive, but usually modern stresses do not call for this, and the same hormones released in today's world can damage health and reduce coping ability. CAUSES  Pressure to perform at work, at school or in sports.  Threats of physical violence.  Money worries.  Arguments.  Family conflicts.  Divorce or separation from significant other.  Bereavement.  New job or unemployment.  Changes in location.  Alcohol or drug abuse. SOMETIMES, THERE IS NO PARTICULAR REASON FOR DEVELOPING STRESS. Almost all people are at risk of being stressed at some time in their lives. It is important to know that some stress is temporary and some is long term.  Temporary stress will go away when a situation is resolved. Most people can cope with short periods of stress, and it can often be relieved by relaxing, taking a walk or getting any type of exercise, chatting through issues with friends, or having a good night's sleep.  Chronic (long-term, continuous) stress is much harder to deal with. It can be psychologically and emotionally damaging. It can be harmful both for an individual and  for friends and family. SYMPTOMS Everyone reacts to stress differently. There are some common effects that help [place] recognize it. In times of extreme stress, people may:  Shake uncontrollably.  Breathe faster and deeper than normal (hyperventilate).  Vomit.  For people with asthma, stress can trigger an attack.  For some people, stress may trigger migraine headaches, ulcers, and body pain. PHYSICAL EFFECTS OF STRESS MAY INCLUDE:  Loss of energy.  Skin problems.  Aches and pains resulting from tense muscles, including neck ache, backache and tension headaches.  Increased pain from arthritis and other conditions.  Irregular heart beat (palpitations).  Periods of irritability or anger.  Apathy or depression.  Anxiety (feeling uptight or worrying).  Unusual behavior.  Loss of appetite.  Comfort eating.  Lack of concentration.  Loss of, or decreased, sex-drive.  Increased smoking, drinking, or recreational drug use.  For women, missed periods.  Ulcers, joint pain, and muscle pain. Post-traumatic stress is the stress caused by any serious accident, strong emotional damage, or extremely difficult or violent experience such as rape or war. Post-traumatic stress victims can experience mixtures of emotions such as fear, shame, depression, guilt or anger. It may include recurrent memories or images that may be haunting. These feelings can last for weeks, months or even years after the traumatic event that triggered them. Specialized treatment, possibly with medicines and psychological therapies, is available. If stress is causing physical symptoms, severe distress or making it difficult for you to function as normal, it is worth seeing your caregiver. It is important to remember that although stress is  a usual part of life, extreme or prolonged stress can lead to other illnesses that will need treatment. It is better to visit a doctor sooner rather than later. Stress has been  linked to the development of high blood pressure and heart disease, as well as insomnia and depression. There is no diagnostic test for stress since everyone reacts to it differently. But a caregiver will be able to spot the physical symptoms, such as:  Headaches.  Shingles.  Ulcers. Emotional distress such as intense worry, low mood or irritability should be detected when the doctor asks pertinent questions to identify any underlying problems that might be the cause. In case there are physical reasons for the symptoms, the doctor may also want to do some tests to exclude certain conditions. If you feel that you are suffering from stress, try to identify the aspects of your life that are causing it. Sometimes you may not be able to change or avoid them, but even a small change can have a positive ripple effect. A simple lifestyle change can make all the difference. STRATEGIES THAT CAN HELP DEAL WITH STRESS:  Delegating or sharing responsibilities.  Avoiding confrontations.  Learning to be more assertive.  Regular exercise.  Avoid using alcohol or street drugs to cope.  Eating a healthy, balanced diet, rich in fruit and vegetables and proteins.  Finding humor or absurdity in stressful situations.  Never taking on more than you know you can handle comfortably.  Organizing your time better to get as much done as possible.  Talking to friends or family and sharing your thoughts and fears.  Listening to music or relaxation tapes.  Relaxation techniques like deep breathing, meditation, and yoga.  Tensing and then relaxing your muscles, starting at the toes and working up to the head and neck. If you think that you would benefit from help, either in identifying the things that are causing your stress or in learning techniques to help you relax, see a caregiver who is capable of helping you with this. Rather than relying on medications, it is usually better to try and identify the things  in your life that are causing stress and try to deal with them. There are many techniques of managing stress including counseling, psychotherapy, aromatherapy, yoga, and exercise. Your caregiver can help you determine what is best for you. Document Released: 06/10/2002 Document Revised: 03/25/2013 Document Reviewed: 05/07/2007 [facility] Patient Information 2015 [place], [place]. This information is not intended to replace advice given to you by your health care provider. Make sure you discuss any questions you have with your health care provider.

## 2014-04-10 NOTE — Progress Notes (Signed)
Subjective:    Patient ID: Dana Dunlap, female    DOB: 04/09/1971, 44 y.o.   MRN: [id number]  HPI  Dana Dunlap is a 44 yo female with a CC of stress, DOE, and tachycardia.   1) Tachycardia, feeling of doom, work is stressful, DOE,  Started last year and getting worse  Heart beats fast  A coworker have had sudden death Artist as a Paediatric nurse on the [employer] bench.  Patient has been off work for 2 days and she is now calming down and finding it is normal.   Recommended counselor and they are available through [facility]. She will look into this.   Review of Systems  Constitutional: Negative for fever, chills, diaphoresis, fatigue and unexpected weight change.  HENT: Negative for tinnitus and trouble swallowing.   Eyes: Negative for visual disturbance.  Respiratory: Positive for shortness of breath. Negative for cough, chest tightness and wheezing.   Cardiovascular: Positive for palpitations. Negative for chest pain and leg swelling.  Gastrointestinal: Negative for nausea, vomiting, abdominal pain, diarrhea, constipation and blood in stool.  Musculoskeletal: Negative for myalgias, back pain, arthralgias and gait problem.  Skin: Negative for color change and rash.  Neurological: Negative for dizziness, weakness, numbness and headaches.  Hematological: Does not bruise/bleed easily.  Psychiatric/Behavioral: Negative for suicidal ideas and sleep disturbance. The patient is nervous/anxious.    Past Medical History  Diagnosis Date  . Depression     Currently on Cymbalta  . Hypertension   . Frequent headaches     Worse during monthly menstrual cycle  . Fibroids     History   Social History  . Marital Status: Single    Spouse Name: N/A    Number of Children: N/A  . Years of Education: 16   Occupational History  . 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 Smoking status: Never Smoker   . Smokeless tobacco:  Never Used  . Alcohol Use: Yes     Comment: occasional alcohol use  . Drug Use: No  . Sexual Activity: Yes    Birth Control/ Protection: Condom   Other Topics Concern  . Not on file   Social History Narrative   Dana Dunlap is originally from [place]. She attended [employer] in [place] where she obtained her Therapist, nutritional in Biology in 1995. 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 She is in a long term relationship with her boyfried, [name]. They have been together for 6 years. Dana Dunlap transferred to [employer] with [employer] in January. She enjoys reading and she enjoys the outdoors. She loves to travel. 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    No past surgical history on file.  Family History  Problem Relation Age of Onset  . Hypertension Mother   . Hyperlipidemia Mother   . Cancer Father     Prostate cancer  . Hypertension Father   . Hyperlipidemia Father   . Stroke Maternal Aunt   . Stroke Paternal Aunt   . Diabetes Paternal Aunt     Allergies  Allergen Reactions  . Compazine [Prochlorperazine Edisylate] Other (See Comments)    Seizure     Current Outpatient Prescriptions on File Prior to Visit  Medication Sig Dispense Refill  . DULoxetine (CYMBALTA) 30 MG capsule Take 30 mg by mouth daily. Take with 60 mg for  total 90 mg daily    . DULoxetine (CYMBALTA) 60 MG capsule Take 60 mg by mouth daily. Take with 30 mg for total 90 mg daily    . esomeprazole (NEXIUM) 40 MG capsule Take 1 capsule (40 mg total) by mouth daily. 30 capsule 3  . guaiFENesin-codeine 100-10 MG/5ML syrup Take 5 mLs by mouth 3 (three) times daily as needed. 120 mL 0  . Multiple Vitamin (MULTIVITAMIN) tablet Take 1 tablet by mouth daily. Isotonic vitamins    . nebivolol (BYSTOLIC) 5 MG tablet Take 1 tablet (5 mg total) by mouth daily. 30 tablet 0   Current Facility-Administered Medications on File Prior to Visit   Medication Dose Route Frequency Provider Last Rate Last Dose  . TDaP (BOOSTRIX) injection 0.5 mL  0.5 mL Intramuscular Once Raquel Dagoberto Ligas, NP           Objective:   Physical Exam  Constitutional: She is oriented to person, place, and time. She appears well-developed and well-nourished. No distress.  HENT:  Head: Normocephalic and atraumatic.  Eyes: Conjunctivae and EOM are normal. Pupils are equal, round, and reactive to light. Right eye exhibits no discharge. Left eye exhibits no discharge. No scleral icterus.  Neck: Normal range of motion. Neck supple. No thyromegaly present.  Cardiovascular: Normal rate, regular rhythm, normal heart sounds and intact distal pulses.  Exam reveals no gallop and no friction rub.   No murmur heard. Pulmonary/Chest: Effort normal and breath sounds normal. No respiratory distress. She has no wheezes. She has no rales. She exhibits no tenderness.  Lymphadenopathy:    She has no cervical adenopathy.  Neurological: She is alert and oriented to person, place, and time. No cranial nerve deficit. She exhibits normal muscle tone. Coordination normal.  Skin: Skin is warm and dry. No rash noted. She is not diaphoretic.  Psychiatric: She has a normal mood and affect. Her behavior is normal. Judgment and thought content normal.  Anxious and tearful during interview.    BP 130/60 mmHg  Pulse 78  Temp(Src) 98.2 F (36.8 C) (Oral)  Resp 12  Ht 5\' 1"  (1.549 m)  Wt 155 lb 1.9 oz (70.362 kg)  BMI 29.32 kg/m2  SpO2 99%    Assessment & Plan:

## 2014-04-10 NOTE — Telephone Encounter (Signed)
Please call pt and instruct her that hemoglobin is low at 5.1. This is likely cause of her tachycardia and fatigue. She will need to go to the ED for further evaluation and transfusion. We can call ahead to the ED to let them know she is coming.

## 2014-04-10 NOTE — Telephone Encounter (Signed)
Thank you! Pt will be sent to ER for transfusion.

## 2014-04-10 NOTE — Progress Notes (Signed)
Pre visit review using our clinic review tool, if applicable. No additional management support is needed unless otherwise documented below in the visit note. 

## 2014-04-10 NOTE — Telephone Encounter (Signed)
Critical  Hemoglobin 5.1 Repeated and verified X2.    Hematocrit 18.2 Repeated and verified X2.

## 2014-04-10 NOTE — Telephone Encounter (Signed)
Called patient at 3:23 and notified her to go to the ER as soon as possible due to lab results.  Patient verbalized understanding and agreed to leave immediately.

## 2014-04-10 NOTE — Telephone Encounter (Signed)
Records requested from [facility].

## 2014-04-11 LAB — HEMOGLOBIN: HGB: 8.8 g/dL — AB (ref 12.0–16.0)

## 2014-04-11 LAB — CBC WITH DIFFERENTIAL/PLATELET
BASOS ABS: 0.1 10*3/uL (ref 0.0–0.1)
Basophil %: 1.4 %
EOS ABS: 0.1 10*3/uL (ref 0.0–0.7)
Eosinophil %: 2.7 %
HCT: 25.5 % — ABNORMAL LOW (ref 35.0–47.0)
HGB: 7.8 g/dL — AB (ref 12.0–16.0)
LYMPHS PCT: 36 %
Lymphocyte #: 1.6 10*3/uL (ref 1.0–3.6)
MCH: 19.5 pg — ABNORMAL LOW (ref 26.0–34.0)
MCHC: 30.4 g/dL — ABNORMAL LOW (ref 32.0–36.0)
MCV: 64 fL — ABNORMAL LOW (ref 80–100)
MONO ABS: 0.4 x10 3/mm (ref 0.2–0.9)
Monocyte %: 9.7 %
NEUTROS ABS: 2.3 10*3/uL (ref 1.4–6.5)
NEUTROS PCT: 50.2 %
Platelet: 466 10*3/uL — ABNORMAL HIGH (ref 150–440)
RBC: 3.99 10*6/uL (ref 3.80–5.20)
RDW: 32.7 % — AB (ref 11.5–14.5)
WBC: 4.5 10*3/uL (ref 3.6–11.0)

## 2014-04-11 LAB — D-DIMER, QUANTITATIVE: D-Dimer, Quant: 0.27 ug/mL-FEU (ref 0.00–0.48)

## 2014-04-11 LAB — HEMATOCRIT: HCT: 28.9 % — ABNORMAL LOW (ref 35.0–47.0)

## 2014-04-13 ENCOUNTER — Telehealth: Payer: Self-pay

## 2014-04-13 ENCOUNTER — Ambulatory Visit: Payer: Self-pay | Admitting: Nurse Practitioner

## 2014-04-13 NOTE — Assessment & Plan Note (Addendum)
Worsening. Repeat CBC w/ diff. Also, get d-dimer to rule out blood clots due to symptoms. FU 4 weeks.  Update: Sent to ER by Dr. Gilford Rile who received critical lab. She was notified and went to [facility] for 3 units of blood. She was called 04/13/13 by LPN and stated she felt 90% better.

## 2014-04-14 DIAGNOSIS — R000 Tachycardia, unspecified: Secondary | ICD-10-CM | POA: Insufficient documentation

## 2014-04-14 DIAGNOSIS — R0600 Dyspnea, unspecified: Secondary | ICD-10-CM | POA: Insufficient documentation

## 2014-04-14 DIAGNOSIS — R0609 Other forms of dyspnea: Secondary | ICD-10-CM | POA: Insufficient documentation

## 2014-04-14 NOTE — Assessment & Plan Note (Signed)
Stable. D-dimer was negative for blood clots.

## 2014-04-14 NOTE — Assessment & Plan Note (Signed)
Worsening. Planned for embolization

## 2014-04-14 NOTE — Assessment & Plan Note (Addendum)
EKG and TSH were normal. Stable.

## 2014-04-14 NOTE — Telephone Encounter (Signed)
Called patient to follow up.  Pt says she was feeling 90% better and believes we saved her life!

## 2014-05-25 ENCOUNTER — Telehealth: Payer: Self-pay | Admitting: *Deleted

## 2014-05-25 NOTE — Telephone Encounter (Signed)
Fax from pharmacy requesting Cymbalta 60mg  and Cymbalta 30mg .  Last OV 1.8.16.  Please advise refill

## 2014-05-26 ENCOUNTER — Other Ambulatory Visit: Payer: Self-pay | Admitting: *Deleted

## 2014-05-26 MED ORDER — DULOXETINE HCL 30 MG PO CPEP: 30.0000 mg | ORAL_CAPSULE | Freq: Every day | ORAL | Status: DC

## 2014-05-26 MED ORDER — DULOXETINE HCL 60 MG PO CPEP: 60.0000 mg | ORAL_CAPSULE | Freq: Every day | ORAL | Status: DC

## 2014-05-26 NOTE — Telephone Encounter (Signed)
Okay to refill? 

## 2014-05-26 NOTE — Telephone Encounter (Signed)
Can you clarify how and why she is taking 2 different doses of Cymbalta? Thanks!

## 2014-05-26 NOTE — Telephone Encounter (Signed)
She takes a total of 90 mg.

## 2014-10-22 ENCOUNTER — Other Ambulatory Visit: Payer: Self-pay | Admitting: Nurse Practitioner

## 2014-10-22 NOTE — Telephone Encounter (Signed)
Last OV 1.8.16, last refill 2.23.16.  Please advise refill

## 2015-03-31 ENCOUNTER — Encounter: Payer: Self-pay | Admitting: Nurse Practitioner

## 2015-03-31 ENCOUNTER — Ambulatory Visit (INDEPENDENT_AMBULATORY_CARE_PROVIDER_SITE_OTHER): Payer: 59 | Admitting: Nurse Practitioner

## 2015-03-31 VITALS — BP 122/68 | HR 83 | Temp 98.9°F | Resp 14 | Ht 61.0 in | Wt 152.8 lb

## 2015-03-31 DIAGNOSIS — J0100 Acute maxillary sinusitis, unspecified: Secondary | ICD-10-CM

## 2015-03-31 MED ORDER — FLUTICASONE PROPIONATE 50 MCG/ACT NA SUSP: 2.0000 | Freq: Every day | NASAL | Status: DC

## 2015-03-31 MED ORDER — GUAIFENESIN-CODEINE 100-10 MG/5ML PO SOLN: 5.0000 mL | Freq: Three times a day (TID) | ORAL | Status: DC | PRN

## 2015-03-31 MED ORDER — AMOXICILLIN-POT CLAVULANATE 875-125 MG PO TABS: 1.0000 | ORAL_TABLET | Freq: Two times a day (BID) | ORAL | Status: DC

## 2015-03-31 NOTE — Progress Notes (Signed)
Patient ID: Dana Dunlap, female    DOB: 04/09/1971  Age: 44 y.o. MRN: [id number]  CC: Sinusitis   HPI [name] presents for CC of sinus pressure x 5 days.   1) Sat- Facial pain, throat, nasal drainage  Bumps in left nostril Coughing- mucous yellow or brown  Pressure in head - behind eyes Sneezing  Tmax- 100.0   Treatment to date: Theraflu- helpful  Resting   Sick contacts: Co-workers  History Dana Dunlap has a past medical history of Depression; Hypertension; Frequent headaches; and Fibroids.   She has no past surgical history on file.   Her family history includes Cancer in her father; Diabetes in her paternal aunt; Hyperlipidemia in her father and mother; Hypertension in her father and mother; Stroke in her maternal aunt and paternal aunt.She reports that she has never smoked. She has never used smokeless tobacco. She reports that she drinks alcohol. She reports that she does not use illicit drugs.  Outpatient Prescriptions Prior to Visit  Medication Sig Dispense Refill  . DULoxetine (CYMBALTA) 30 MG capsule Take 1 capsule by mouth  daily , take with 60mg  for  total 90mg  daily 90 capsule 1  . DULoxetine (CYMBALTA) 60 MG capsule Take 1 capsule by mouth  daily , take with 30mg  for  total 90mg  daily 90 capsule 1  . esomeprazole (NEXIUM) 40 MG capsule Take 1 capsule (40 mg total) by mouth daily. 30 capsule 3  . nebivolol (BYSTOLIC) 5 MG tablet Take 1 tablet (5 mg total) by mouth daily. 30 tablet 0  . Multiple Vitamin (MULTIVITAMIN) tablet Take 1 tablet by mouth daily. Reported on 03/31/2015    . guaiFENesin-codeine 100-10 MG/5ML syrup Take 5 mLs by mouth 3 (three) times daily as needed. 120 mL 0   Facility-Administered Medications Prior to Visit  Medication Dose Route Frequency Provider Last Rate Last Dose  . TDaP (BOOSTRIX) injection 0.5 mL  0.5 mL Intramuscular Once Raquel M Rey, NP        ROS Review of Systems  Constitutional: Negative for fever, chills, diaphoresis and  fatigue.  HENT: Positive for congestion, sinus pressure and sneezing.   Respiratory: Positive for cough. Negative for chest tightness, shortness of breath and wheezing.   Cardiovascular: Negative for chest pain, palpitations and leg swelling.  Gastrointestinal: Negative for nausea, vomiting and diarrhea.  Skin: Negative for rash.  Neurological: Negative for dizziness, weakness, numbness and headaches.  Psychiatric/Behavioral: The patient is not nervous/anxious.     Objective:  BP 122/68 mmHg  Pulse 83  Temp(Src) 98.9 F (37.2 C) (Oral)  Resp 14  Ht 5\' 1"  (1.549 m)  Wt 152 lb 12.8 oz (69.31 kg)  BMI 28.89 kg/m2  SpO2 98%  LMP  (Approximate)  Physical Exam  Constitutional: She is oriented to person, place, and time. She appears well-developed and well-nourished. No distress.  HENT:  Head: Normocephalic and atraumatic.  Right Ear: External ear normal.  Left Ear: External ear normal.  Mouth/Throat: No oropharyngeal exudate.  Small flesh colored multiple bumps in left nostril  Eyes: EOM are normal. Pupils are equal, round, and reactive to light. Right eye exhibits no discharge. Left eye exhibits no discharge. No scleral icterus.  Cardiovascular: Normal rate, regular rhythm and normal heart sounds.  Exam reveals no gallop and no friction rub.   No murmur heard. Pulmonary/Chest: Effort normal and breath sounds normal. No respiratory distress. She has no wheezes. She has no rales. She exhibits no tenderness.  Neurological: She is alert and oriented to  person, place, and time. No cranial nerve deficit. She exhibits normal muscle tone. Coordination normal.  Skin: Skin is warm and dry. No rash noted. She is not diaphoretic.  Psychiatric: She has a normal mood and affect. Her behavior is normal. Judgment and thought content normal.    Assessment & Plan:   There are no diagnoses linked to this encounter. I am having Ms. Prichard start on fluticasone and amoxicillin-clavulanate. I am also  having her maintain her multivitamin, esomeprazole, nebivolol, DULoxetine, DULoxetine, and guaiFENesin-codeine. We will continue to administer Tdap.  Meds ordered this encounter  Medications  . guaiFENesin-codeine 100-10 MG/5ML syrup    Sig: Take 5 mLs by mouth 3 (three) times daily as needed.    Dispense:  120 mL    Refill:  0    Order Specific Question:  Supervising Provider    Answer:  Deborra Medina L [[id number]]  . fluticasone (FLONASE) 50 MCG/ACT nasal spray    Sig: Place 2 sprays into both nostrils daily.    Dispense:  16 g    Refill:  6    Order Specific Question:  Supervising Provider    Answer:  Deborra Medina L [[id number]]  . amoxicillin-clavulanate (AUGMENTIN) 875-125 MG tablet    Sig: Take 1 tablet by mouth 2 (two) times daily.    Dispense:  14 tablet    Refill:  0    Order Specific Question:  Supervising Provider    Answer:  Crecencio Mc [[id number]]    Follow-up: Return if symptoms worsen or fail to improve.

## 2015-03-31 NOTE — Progress Notes (Signed)
Pre visit review using our clinic review tool, if applicable. No additional management support is needed unless otherwise documented below in the visit note. 

## 2015-03-31 NOTE — Patient Instructions (Addendum)
Please take the flonase daily.   5 mL (1 teaspoon) of cough syrup. Don't drive or make important decisions while taking this....just sleep and rest.   Fill the antibiotic if you are not improving in 72 hours or fever of 100.5 or greater (This was sent to your pharmacy)

## 2015-04-08 DIAGNOSIS — J019 Acute sinusitis, unspecified: Secondary | ICD-10-CM | POA: Insufficient documentation

## 2015-04-08 NOTE — Assessment & Plan Note (Addendum)
Pt was given Flonase and Robitussin AC (cautions and instructions given verbally and on AVS) to try first, if not helpful start the augmentin. Take a probiotic with this. FU prn worsening/failure to improve.

## 2015-09-02 ENCOUNTER — Other Ambulatory Visit: Payer: Self-pay

## 2015-09-02 NOTE — Telephone Encounter (Signed)
Pt is requesting a refill. Last filled on 10/22/2014 #90 + 1, pt was last on 03/31/2015 for an acute visit. Okay to refill?

## 2015-09-05 MED ORDER — DULOXETINE HCL 30 MG PO CPEP: ORAL_CAPSULE | ORAL | Status: DC

## 2016-03-21 ENCOUNTER — Observation Stay
Admission: EM | Admit: 2016-03-21 | Discharge: 2016-03-22 | Disposition: A | Discharge status: Home / Self Care | Payer: 59 | Attending: Internal Medicine | Admitting: Internal Medicine

## 2016-03-21 ENCOUNTER — Encounter: Payer: Self-pay | Admitting: Family Medicine

## 2016-03-21 ENCOUNTER — Encounter: Payer: Self-pay | Admitting: Emergency Medicine

## 2016-03-21 ENCOUNTER — Ambulatory Visit (INDEPENDENT_AMBULATORY_CARE_PROVIDER_SITE_OTHER): Payer: 59 | Admitting: Family Medicine

## 2016-03-21 VITALS — BP 127/71 | HR 95 | Temp 98.5°F | Resp 12 | Wt 148.1 lb

## 2016-03-21 DIAGNOSIS — Z79899 Other long term (current) drug therapy: Secondary | ICD-10-CM | POA: Insufficient documentation

## 2016-03-21 DIAGNOSIS — F32A Depression, unspecified: Secondary | ICD-10-CM | POA: Diagnosis present

## 2016-03-21 DIAGNOSIS — D500 Iron deficiency anemia secondary to blood loss (chronic): Secondary | ICD-10-CM

## 2016-03-21 DIAGNOSIS — I10 Essential (primary) hypertension: Secondary | ICD-10-CM | POA: Insufficient documentation

## 2016-03-21 DIAGNOSIS — F329 Major depressive disorder, single episode, unspecified: Secondary | ICD-10-CM | POA: Diagnosis not present

## 2016-03-21 DIAGNOSIS — F419 Anxiety disorder, unspecified: Secondary | ICD-10-CM

## 2016-03-21 DIAGNOSIS — D473 Essential (hemorrhagic) thrombocythemia: Secondary | ICD-10-CM | POA: Diagnosis not present

## 2016-03-21 DIAGNOSIS — R739 Hyperglycemia, unspecified: Secondary | ICD-10-CM | POA: Diagnosis not present

## 2016-03-21 DIAGNOSIS — D649 Anemia, unspecified: Secondary | ICD-10-CM | POA: Diagnosis present

## 2016-03-21 DIAGNOSIS — D75839 Thrombocytosis, unspecified: Secondary | ICD-10-CM

## 2016-03-21 DIAGNOSIS — Z13220 Encounter for screening for lipoid disorders: Secondary | ICD-10-CM

## 2016-03-21 DIAGNOSIS — D259 Leiomyoma of uterus, unspecified: Secondary | ICD-10-CM | POA: Diagnosis not present

## 2016-03-21 DIAGNOSIS — R030 Elevated blood-pressure reading, without diagnosis of hypertension: Secondary | ICD-10-CM | POA: Diagnosis present

## 2016-03-21 LAB — BASIC METABOLIC PANEL
ANION GAP: 9 (ref 5–15)
BUN: 8 mg/dL (ref 6–20)
CALCIUM: 9.1 mg/dL (ref 8.9–10.3)
CO2: 22 mmol/L (ref 22–32)
CREATININE: 0.85 mg/dL (ref 0.44–1.00)
Chloride: 107 mmol/L (ref 101–111)
Glucose, Bld: 92 mg/dL (ref 65–99)
Potassium: 4.3 mmol/L (ref 3.5–5.1)
SODIUM: 138 mmol/L (ref 135–145)

## 2016-03-21 LAB — CBC AND DIFFERENTIAL
HCT: 24 % — AB (ref 36–46)
HEMOGLOBIN: 6.2 g/dL — AB (ref 12.0–16.0)
Platelets: 1200 10*3/uL — AB (ref 150–399)
WBC: 4.1 10*3/mL

## 2016-03-21 LAB — CBC
HEMATOCRIT: 23.7 % — AB (ref 35.0–45.0)
HEMOGLOBIN: 6.2 g/dL — AB (ref 11.7–15.5)
MCH: 16.1 pg — ABNORMAL LOW (ref 27.0–33.0)
MCHC: 26.2 g/dL — ABNORMAL LOW (ref 32.0–36.0)
MCV: 61.7 fL — ABNORMAL LOW (ref 80.0–100.0)
MPV: 8.4 fL (ref 7.5–12.5)
Platelets: 1382 10*3/uL — ABNORMAL HIGH (ref 140–400)
RBC: 3.84 MIL/uL (ref 3.80–5.10)
RDW: 27.5 % — ABNORMAL HIGH (ref 11.0–15.0)
WBC: 4.1 10*3/uL (ref 3.8–10.8)

## 2016-03-21 MED ORDER — CLONAZEPAM 0.5 MG PO TABS: 0.5000 mg | ORAL_TABLET | Freq: Two times a day (BID) | ORAL | 0 refills | Status: DC | PRN

## 2016-03-21 MED ORDER — SODIUM CHLORIDE 0.9 % IV SOLN
10.0000 mL/h | Freq: Once | INTRAVENOUS | Status: AC
  Administered 2016-03-22: 10 mL/h via INTRAVENOUS

## 2016-03-21 NOTE — Assessment & Plan Note (Signed)
New problem (to me). Worsening. Treating with Klonopin.

## 2016-03-21 NOTE — Patient Instructions (Signed)
Klonopin twice daily as needed.  Follow up after hospitalization (I suspect this will be the case).  Take care  Dr. Lacinda Axon

## 2016-03-21 NOTE — Progress Notes (Signed)
Subjective:  Patient ID: Dana Dunlap, female    DOB: [birth year]  Age: 45 y.o. MRN: [id number]  CC: Anxiety, palpitations  HPI:  45 year old female with history of profound anemia secondary to numerous fibroids/heavy menstrual bleeding presents with complaints of anxiety. Will discuss anemia as patient has not been seen since 2016 and anemia has not been addressed since that time.  Anxiety  Worsening over the past 6 months.  Patient states that she is "overwhelmed".  There have been some layoffs at work and she is one of a few individuals in her department. She is overwhelmed with the amount of work that needs to be done.  She has brought this up to her superior and she was "yelled at".  She states that as a result she's had worsening anxiety. She's had associated palpitations.  She states that she is also quite anxious and worried about her health given her history of profound anemia.  She is currently taking Cymbalta.  She would like to discuss treatment options regarding anxiety today.  Anemia  Patient has a history of profound anemia secondary to uterine fibroids.  She has not had treatment for this. She has been reluctant to take medication or undergo surgical procedures.  It has been recommended that she have a hysterectomy and she has refused.  She has not been seen regarding her anemia since 2016.  She reports fatigue and shortness of breath with exertion.  She states that she has heavy menstrual bleeding.  She is currently on her cycle.  Social Hx   Social History   Social History  . Marital status: Single    Spouse name: N/A  . Number of children: N/A  . Years of education: 6   Occupational History  . 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 Smoking status: Never Smoker  . Smokeless tobacco: Never Used  . Alcohol use Yes     Comment: occasional alcohol use  . Drug use: No  . Sexual activity: Yes    Birth  control/ protection: Condom   Other Topics Concern  . None   Social History Narrative   [name] is originally from [place]. She attended [employer] in [place] where she obtained her Therapist, nutritional in Biology in 1995. 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 She is in a long term relationship with her boyfried, Dana Dunlap. They have been together for 6 years. [name] transferred to [facility] with [place] in January. She enjoys reading and she enjoys the outdoors. She loves to travel. 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    Review of Systems  Constitutional: Positive for fatigue.  Respiratory: Positive for shortness of breath.   Cardiovascular: Positive for palpitations.  Psychiatric/Behavioral: The patient is nervous/anxious.    Objective:  BP 127/71 (BP Location: Left Arm, Patient Position: Sitting, Cuff Size: Normal)   Pulse 95   Temp 98.5 F (36.9 C) (Oral)   Resp 12   Wt 148 lb 2 oz (67.2 kg)   SpO2 100%   BMI 27.99 kg/m   BP/Weight 03/21/2016 25/36/6440 06/05/7423  Systolic BP 956 387 564  Diastolic BP 71 68 60  Wt. (Lbs) 148.13 152.8 155.12  BMI 27.99 28.89 29.32   Physical Exam  Constitutional: She is oriented to person, place, and time. She appears well-developed.  No apparent distress.  HENT:  Conjunctival  pallor noted (severe).  Cardiovascular: Regular rhythm.   Tachycardic. 2/6 systolic murmur.   Pulmonary/Chest: Effort normal and breath sounds normal.  Neurological: She is alert and oriented to person, place, and time.  Psychiatric:  Anxious.  Vitals reviewed.  Assessment & Plan:   Problem List Items Addressed This Visit    HTN (hypertension)   Relevant Orders   Comp Met (CMET)   Anxiety - Primary    New problem (to me). Worsening. Treating with Klonopin.      Anemia, iron deficiency    Established problem. Discussed need for treatment. Patient wants to  talk to a Gyn she has heard of in [place] (this physician does not recommend hysterectomy per her report). Urged to see hematology and consider Lupron. Patient okay with seeing hematology.  **Labs return after visit and revealed a hemoglobin of 6.2. Made several attempts to call the patient and there was no response. In box was full so could not leave message. I did speak to her emergency contact, her sister and informed her that she needs to go directly to the hospital.      Relevant Orders   CBC (Completed)   Ambulatory referral to Hematology    Other Visit Diagnoses    Screening for hyperlipidemia       Relevant Orders   Lipid panel   Blood glucose elevated       Relevant Orders   Hemoglobin A1c      Meds ordered this encounter  Medications  . clonazePAM (KLONOPIN) 0.5 MG tablet    Sig: Take 1 tablet (0.5 mg total) by mouth 2 (two) times daily as needed for anxiety.    Dispense:  60 tablet    Refill:  0    Follow-up: Following ED visit or likely admission.  [place]

## 2016-03-21 NOTE — ED Notes (Signed)
Upon assessment pt reports having exertional dizziness but denies any cramps.

## 2016-03-21 NOTE — ED Triage Notes (Signed)
Pt arrived to the ED accompanied by her significant other after being referred to the ED by her primary health care provider for a hemoglobin of 6. Pt is AOx4 in no apparent distress.

## 2016-03-21 NOTE — Progress Notes (Signed)
Pre visit review using our clinic review tool, if applicable. No additional management support is needed unless otherwise documented below in the visit note. 

## 2016-03-21 NOTE — ED Notes (Signed)
MD at bedside. 

## 2016-03-21 NOTE — Assessment & Plan Note (Signed)
Established problem. Discussed need for treatment. Patient wants to talk to a Gyn she has heard of in [place] (this physician does not recommend hysterectomy per her report). Urged to see hematology and consider Lupron. Patient okay with seeing hematology.  **Labs return after visit and revealed a hemoglobin of 6.2. Made several attempts to call the patient and there was no response. In box was full so could not leave message. I did speak to her emergency contact, her sister and informed her that she needs to go directly to the hospital.

## 2016-03-22 ENCOUNTER — Telehealth: Payer: Self-pay | Admitting: *Deleted

## 2016-03-22 ENCOUNTER — Telehealth: Payer: Self-pay | Admitting: Internal Medicine

## 2016-03-22 ENCOUNTER — Encounter: Payer: Self-pay | Admitting: Internal Medicine

## 2016-03-22 DIAGNOSIS — D649 Anemia, unspecified: Secondary | ICD-10-CM | POA: Diagnosis present

## 2016-03-22 DIAGNOSIS — D473 Essential (hemorrhagic) thrombocythemia: Secondary | ICD-10-CM

## 2016-03-22 DIAGNOSIS — D75839 Thrombocytosis, unspecified: Secondary | ICD-10-CM

## 2016-03-22 LAB — CBC
HCT: 28.1 % — ABNORMAL LOW (ref 35.0–47.0)
HEMATOCRIT: 23.2 % — AB (ref 35.0–47.0)
HEMOGLOBIN: 6.5 g/dL — AB (ref 12.0–16.0)
HEMOGLOBIN: 8.9 g/dL — AB (ref 12.0–16.0)
MCH: 16.5 pg — AB (ref 26.0–34.0)
MCH: 20 pg — AB (ref 26.0–34.0)
MCHC: 28 g/dL — ABNORMAL LOW (ref 32.0–36.0)
MCHC: 31.5 g/dL — ABNORMAL LOW (ref 32.0–36.0)
MCV: 58.8 fL — ABNORMAL LOW (ref 80.0–100.0)
MCV: 63.7 fL — AB (ref 80.0–100.0)
Platelets: 1248 10*3/uL (ref 150–440)
Platelets: 1200 10*3/uL (ref 150–440)
RBC: 3.94 MIL/uL (ref 3.80–5.20)
RBC: 4.42 MIL/uL (ref 3.80–5.20)
RDW: 29.8 % — ABNORMAL HIGH (ref 11.5–14.5)
RDW: 34.4 % — ABNORMAL HIGH (ref 11.5–14.5)
WBC: 3.8 10*3/uL (ref 3.6–11.0)
WBC: 4.4 10*3/uL (ref 3.6–11.0)

## 2016-03-22 LAB — COMPREHENSIVE METABOLIC PANEL
ALT: 7 U/L (ref 0–35)
AST: 11 U/L (ref 0–37)
Albumin: 4.1 g/dL (ref 3.5–5.2)
Alkaline Phosphatase: 44 U/L (ref 39–117)
BUN: 7 mg/dL (ref 6–23)
CALCIUM: 9.6 mg/dL (ref 8.4–10.5)
CHLORIDE: 107 meq/L (ref 96–112)
CO2: 25 meq/L (ref 19–32)
CREATININE: 0.81 mg/dL (ref 0.40–1.20)
GFR: 97.08 mL/min (ref >60.00)
Glucose, Bld: 96 mg/dL (ref 70–99)
POTASSIUM: 4.3 meq/L (ref 3.5–5.1)
Sodium: 140 mEq/L (ref 135–145)
Total Bilirubin: 0.3 mg/dL (ref 0.2–1.2)
Total Protein: 7.3 g/dL (ref 6.0–8.3)

## 2016-03-22 LAB — LIPID PANEL
CHOL/HDL RATIO: 3
CHOLESTEROL: 171 mg/dL (ref 0–200)
HDL: 57 mg/dL (ref >39.00)
LDL CALC: 103 mg/dL — AB (ref 0–99)
NonHDL: 113.82
TRIGLYCERIDES: 54 mg/dL (ref 0.0–149.0)
VLDL: 10.8 mg/dL (ref 0.0–40.0)

## 2016-03-22 LAB — BASIC METABOLIC PANEL
ANION GAP: 6 (ref 5–15)
BUN: 10 mg/dL (ref 6–20)
CHLORIDE: 111 mmol/L (ref 101–111)
CO2: 24 mmol/L (ref 22–32)
Calcium: 9.1 mg/dL (ref 8.9–10.3)
Creatinine, Ser: 0.8 mg/dL (ref 0.44–1.00)
GFR calc non Af Amer: >60 mL/min (ref >60)
Glucose, Bld: 106 mg/dL — ABNORMAL HIGH (ref 65–99)
POTASSIUM: 3.9 mmol/L (ref 3.5–5.1)
Sodium: 141 mmol/L (ref 135–145)

## 2016-03-22 LAB — APTT: APTT: 30 s (ref 24–36)

## 2016-03-22 LAB — ABO/RH: ABO/RH(D): O POS

## 2016-03-22 LAB — HEMOGLOBIN A1C: Hgb A1c MFr Bld: 5.2 % (ref 4.6–6.5)

## 2016-03-22 LAB — PROTIME-INR
INR: 1.08
Prothrombin Time: 14 seconds (ref 11.4–15.2)

## 2016-03-22 LAB — PREPARE RBC (CROSSMATCH)

## 2016-03-22 LAB — PATHOLOGIST SMEAR REVIEW

## 2016-03-22 MED ORDER — FERROUS SULFATE 325 (65 FE) MG PO TABS: 325.0000 mg | ORAL_TABLET | Freq: Two times a day (BID) | ORAL | 3 refills | Status: DC

## 2016-03-22 MED ORDER — ACETAMINOPHEN 325 MG PO TABS: 650.0000 mg | ORAL_TABLET | Freq: Four times a day (QID) | ORAL | Status: DC | PRN

## 2016-03-22 MED ORDER — TRAMADOL HCL 50 MG PO TABS: 50.0000 mg | ORAL_TABLET | Freq: Once | ORAL | Status: DC

## 2016-03-22 MED ORDER — KETOROLAC TROMETHAMINE 60 MG/2ML IM SOLN: 60.0000 mg | Freq: Once | INTRAMUSCULAR | Status: DC

## 2016-03-22 MED ORDER — ONDANSETRON HCL 4 MG PO TABS: 4.0000 mg | ORAL_TABLET | Freq: Four times a day (QID) | ORAL | Status: DC | PRN

## 2016-03-22 MED ORDER — CLONAZEPAM 0.5 MG PO TABS: 0.5000 mg | ORAL_TABLET | Freq: Two times a day (BID) | ORAL | Status: DC | PRN

## 2016-03-22 MED ORDER — ENOXAPARIN SODIUM 40 MG/0.4ML ~~LOC~~ SOLN
40.0000 mg | Freq: Every day | SUBCUTANEOUS | Status: DC
  Administered 2016-03-22: 40 mg via SUBCUTANEOUS
  Filled 2016-03-22: qty 0.4

## 2016-03-22 MED ORDER — LORAZEPAM 0.5 MG PO TABS
ORAL_TABLET | ORAL | Status: AC
  Administered 2016-03-22: 0.5 mg via ORAL
  Filled 2016-03-22: qty 1

## 2016-03-22 MED ORDER — ACETAMINOPHEN 650 MG RE SUPP
650.0000 mg | Freq: Four times a day (QID) | RECTAL | Status: DC | PRN
Start: 2016-03-22 — End: 2016-03-22

## 2016-03-22 MED ORDER — DULOXETINE HCL 60 MG PO CPEP
90.0000 mg | ORAL_CAPSULE | Freq: Every day | ORAL | Status: DC
  Administered 2016-03-22: 90 mg via ORAL
  Filled 2016-03-22: qty 1

## 2016-03-22 MED ORDER — CLINDAMYCIN HCL 150 MG PO CAPS: 300.0000 mg | ORAL_CAPSULE | Freq: Once | ORAL | Status: DC

## 2016-03-22 MED ORDER — ONDANSETRON HCL 4 MG/2ML IJ SOLN: 4.0000 mg | Freq: Four times a day (QID) | INTRAMUSCULAR | Status: DC | PRN

## 2016-03-22 MED ORDER — LORAZEPAM 0.5 MG PO TABS
0.5000 mg | ORAL_TABLET | Freq: Once | ORAL | Status: AC
  Administered 2016-03-22: 0.5 mg via ORAL

## 2016-03-22 NOTE — Progress Notes (Signed)
   [place], [place] [address]  March 22, 2016  Patient:  Dana Dunlap Date of Birth: 11-24-1970 Date of Visit:  03/21/2016  To Whom it May Concern:  Please excuse Dana Dunlap from work from 03/21/2016 until 03/22/16 as she was admitted to the [facility] for medical treatment and has been receiving appropriate care. She may return to work on 03/23/16, sooner if she feels she is able to return sooner than this date.      Please don't hesitate to contact me with questions or concerns by calling  [phone] and asking them to page me directly.   Regino Schultze, MD

## 2016-03-22 NOTE — ED Notes (Signed)
Admitting doctor at bedside 

## 2016-03-22 NOTE — Telephone Encounter (Signed)
Transition Care Management Follow-up Telephone Call  How have you been since you were released from the hospital? Waiting for a ride.    Do you understand why you were in the hospital? Yes, blood was too low.     Do you understand the discharge instrcutions? Yes.   Items Reviewed:  Medications reviewed: yes, no changes  Allergies reviewed: yes, same no changes  Dietary changes reviewed: yes, no changes  Referrals reviewed: follow up with [facility] at the cancer center January 10th   Functional Questionnaire:   Activities of Daily Living (ADLs):   She states they are independent in the following:  States they require assistance with the following:    Any transportation issues/concerns?: no issues   Any patient concerns? Has a very stressful job, concerned does she need to get FMLA, wants to understand her labs in more detail.     Confirmed importance and date/time of follow-up visits scheduled: 12/27, at 1130am with PCP   Confirmed with patient if condition begins to worsen call PCP or go to the ER.  Patient was given the Call-a-Nurse line [phone]: yes verbalized understanding

## 2016-03-22 NOTE — H&P (Signed)
[place] [facility] [place] NAME: Dana Dunlap    MR#:  [id number]  DATE OF BIRTH:  28-Feb-1971  DATE OF ADMISSION:  03/21/2016  PRIMARY CARE PHYSICIAN: Coral Spikes, DO   REQUESTING/REFERRING PHYSICIAN: Dahlia Client, MD  CHIEF COMPLAINT:   Chief Complaint  Patient presents with  . Abnormal Lab    HISTORY OF PRESENT ILLNESS:  Dana Dunlap  is a 45 y.o. female who presents with symptomatic anemia.  She has a history of significant menstrual bleeding, with a history of uterine fibroids. This is been a long-standing issue for several years. She was seeing a hematologist at some point in the past, but quit seeing her couple years ago when her hematologist moved. Here in the ED her hemoglobin is 6, and 2 units of PRBC were ordered. However, her platelet count is also almost 1400. Chart review reveals that her platelet count has always been significantly elevated. She denies any history of diagnosis of essential thrombocythemia, and does not know if she was being worked up for this by her hematologist in the past or not. She denies any history of vascular thrombosis both personally or within her family that she knows of. She does have 2 cousins that have had aplastic anemia, but she does not know if the cause was ever elicited. Hospitalists were called for observation given her symptomatic anemia and need for transfusion.  PAST MEDICAL HISTORY:   Past Medical History:  Diagnosis Date  . Depression    Currently on Cymbalta  . Fibroids   . Frequent headaches    Worse during monthly menstrual cycle  . Hypertension     PAST SURGICAL HISTORY:   Past Surgical History:  Procedure Laterality Date  . BREAST MASS EXCISION     benign    SOCIAL HISTORY:   Social History  Substance Use Topics  . Smoking status: Never Smoker  . Smokeless tobacco: Never Used  . Alcohol use Yes     Comment: occasional alcohol use    FAMILY HISTORY:   Family  History  Problem Relation Age of Onset  . Hypertension Mother   . Hyperlipidemia Mother   . Cancer Father     Prostate cancer  . Hypertension Father   . Hyperlipidemia Father   . Stroke Maternal Aunt   . Stroke Paternal Aunt   . Diabetes Paternal Aunt     DRUG ALLERGIES:   Allergies  Allergen Reactions  . Compazine [Prochlorperazine Edisylate] Other (See Comments)    Seizure     MEDICATIONS AT HOME:   Prior to Admission medications   Medication Sig Start Date End Date Taking? Authorizing Provider  clonazePAM (KLONOPIN) 0.5 MG tablet Take 1 tablet (0.5 mg total) by mouth 2 (two) times daily as needed for anxiety. 03/21/16   Coral Spikes, DO  DULoxetine (CYMBALTA) 30 MG capsule Take 1 capsule by mouth  daily , take with 60mg  for  total 90mg  daily 09/05/15   Coral Spikes, DO  DULoxetine (CYMBALTA) 60 MG capsule Take 1 capsule by mouth  daily , take with 30mg  for  total 90mg  daily 10/22/14   Rubbie Battiest, NP  Multiple Vitamin (MULTIVITAMIN) tablet Take 1 tablet by mouth daily. Reported on 03/31/2015    Historical Provider, MD    REVIEW OF SYSTEMS:  Review of Systems  Constitutional: Positive for malaise/fatigue. Negative for chills, fever and weight loss.  HENT: Negative for ear pain, hearing loss and tinnitus.  Eyes: Negative for blurred vision, double vision, pain and redness.  Respiratory: Positive for shortness of breath. Negative for cough and hemoptysis.   Cardiovascular: Negative for chest pain, palpitations, orthopnea and leg swelling.       Lightheadedness  Gastrointestinal: Negative for abdominal pain, constipation, diarrhea, nausea and vomiting.  Genitourinary: Negative for dysuria, frequency and hematuria.  Musculoskeletal: Negative for back pain, joint pain and neck pain.  Skin:       No acne, rash, or lesions  Neurological: Negative for dizziness, tremors, focal weakness and weakness.  Endo/Heme/Allergies: Negative for polydipsia. Does not bruise/bleed easily.   Psychiatric/Behavioral: Negative for depression. The patient is not nervous/anxious and does not have insomnia.      VITAL SIGNS:   Vitals:   03/21/16 2234 03/21/16 2307 03/21/16 2351 03/22/16 0016  BP:  122/67 113/60 126/62  Pulse:  84 88 73  Resp:  20 20 19   Temp:    98 F (36.7 C)  TempSrc:    Oral  SpO2: 100% 100% 100% 100%  Weight:      Height:       Wt Readings from Last 3 Encounters:  03/21/16 66.2 kg (146 lb)  03/21/16 67.2 kg (148 lb 2 oz)  03/31/15 69.3 kg (152 lb 12.8 oz)    PHYSICAL EXAMINATION:  Physical Exam  Vitals reviewed. Constitutional: She is oriented to person, place, and time. She appears well-developed and well-nourished. No distress.  HENT:  Head: Normocephalic and atraumatic.  Mouth/Throat: Oropharynx is clear and moist.  Eyes: Conjunctivae and EOM are normal. Pupils are equal, round, and reactive to light. No scleral icterus.  Neck: Normal range of motion. Neck supple. No JVD present. No thyromegaly present.  Cardiovascular: Normal rate, regular rhythm and intact distal pulses.  Exam reveals no gallop and no friction rub.   Murmur (systolic) heard. Respiratory: Effort normal and breath sounds normal. No respiratory distress. She has no wheezes. She has no rales.  GI: Soft. Bowel sounds are normal. She exhibits no distension. There is no tenderness.  Musculoskeletal: Normal range of motion. She exhibits no edema.  No arthritis, no gout  Lymphadenopathy:    She has no cervical adenopathy.  Neurological: She is alert and oriented to person, place, and time. No cranial nerve deficit.  No dysarthria, no aphasia  Skin: Skin is warm and dry. No rash noted. No erythema.  Psychiatric: She has a normal mood and affect. Her behavior is normal. Judgment and thought content normal.    LABORATORY PANEL:   CBC  Recent Labs Lab 03/21/16 2256  WBC 3.8  HGB 6.5*  HCT 23.2*  PLT PENDING    ------------------------------------------------------------------------------------------------------------------  Chemistries   Recent Labs Lab 03/21/16 2256  NA 138  K 4.3  CL 107  CO2 22  GLUCOSE 92  BUN 8  CREATININE 0.85  CALCIUM 9.1   ------------------------------------------------------------------------------------------------------------------  Cardiac Enzymes No results for input(s): TROPONINI in the last 168 hours. ------------------------------------------------------------------------------------------------------------------  RADIOLOGY:  No results found.  EKG:   Orders placed or performed in visit on 04/10/14  . EKG 12-Lead    IMPRESSION AND PLAN:  Principal Problem:   Symptomatic anemia - likely due to significant menstrual bleeding likely caused by her large fibroids. Unclear if there is potentially some relation or risk of increased bleeding from her thrombocytosis. Essential thrombocythemia certainly could put her at high risk of bleeding if she had something like acquired von Willebrand's. We'll transfuse 2 units of blood tonight, and get a hematology consult  Active Problems:   Thrombocytosis ([place]) - we will send some preliminary screening blood work as part of workup for essential thrombocythemia, and we'll get a hematology consult.   Fibroid, uterine - patient has been told that she needs a hysterectomy, but does not want hysterectomy. Fibroids are likely exacerbating her menstrual bleeding. This is possibly in combination with some platelet disorder above.   Anxiety - continue home dose anxiolytics  All the records are reviewed and case discussed with ED provider. Management plans discussed with the patient and/or family.  DVT PROPHYLAXIS: SubQ lovenox  GI PROPHYLAXIS: None  ADMISSION STATUS: Observation  CODE STATUS: Full Code Status History    This patient does not have a recorded code status. Please follow your organizational policy  for patients in this situation.      TOTAL TIME TAKING CARE OF THIS PATIENT: 40 minutes.    Dalyla Chui Marion 03/22/2016, 12:59 AM  [facility]  Office  [phone]  CC: Primary care physician; Coral Spikes, DO

## 2016-03-22 NOTE — Telephone Encounter (Signed)
Pt will discharge from [facility] on 03/22/16. Pt has been scheduled for a HFU

## 2016-03-22 NOTE — Progress Notes (Signed)
Pt discharged home with boyfriend-escorted to car by staff in a wheelchair-no distress noted.

## 2016-03-22 NOTE — ED Provider Notes (Signed)
[facility] Emergency Department Provider Note   ____________________________________________   First MD Initiated Contact with Patient 03/21/16 2332     (approximate)  I have reviewed the triage vital signs and the nursing notes.   HISTORY  Chief Complaint Abnormal Lab    HPI Katelynne Fairbrother is a 45 y.o. female who comes into the hospital today with anemia. The patient reports that she went to her primary care physician's office today. She was told that they were going to check her blood work and if her blood counts below there were going to send her in. The patient has a history of anemia due to fibroids. She reports that her hemoglobin is chronically low. She reports that she has been told that she needs to have a hysterectomy but she does not believe in hysterectomies. The patient reports that she wants to keep her uterus and no one wants to help her do that. She reports that her hemoglobin is usually around 5 and she has a history of aplastic anemia and her family. She has seen a hematologist in the past for this anemia but stopped going about 2 years ago. She reports though that she has been short of breath with exertion, shaky, dizzy with no energy and her heart has been beating fast. The patient reports that when she saw her doctor today he also noticed a murmur. The patient reports that she is currently on her menstrual cycle and she is bleeding. She reports that she was sent in for evaluation and a possible transfusion. The patient reports that her last transfusion was over a year ago. She is here for evaluation.   Past Medical History:  Diagnosis Date  . Depression    Currently on Cymbalta  . Fibroids   . Frequent headaches    Worse during monthly menstrual cycle  . Hypertension     Patient Active Problem List   Diagnosis Date Noted  . Symptomatic anemia 03/22/2016  . Thrombocytosis ([place]) 03/22/2016  . Anxiety 03/21/2016  . Fibroid, uterine  07/28/2013  . Anemia, iron deficiency 07/28/2013  . HTN (hypertension) 03/26/2013    Past Surgical History:  Procedure Laterality Date  . BREAST MASS EXCISION     benign    Prior to Admission medications   Medication Sig Start Date End Date Taking? Authorizing Provider  clonazePAM (KLONOPIN) 0.5 MG tablet Take 1 tablet (0.5 mg total) by mouth 2 (two) times daily as needed for anxiety. 03/21/16  Yes Coral Spikes, DO  DULoxetine (CYMBALTA) 30 MG capsule Take 1 capsule by mouth  daily , take with 60mg  for  total 90mg  daily 09/05/15  Yes Coral Spikes, DO  DULoxetine (CYMBALTA) 60 MG capsule Take 1 capsule by mouth  daily , take with 30mg  for  total 90mg  daily 10/22/14  Yes Rubbie Battiest, NP  Ferrous Fumarate 90 MG TABS Take 90 mg by mouth daily.   Yes Historical Provider, MD    Allergies Compazine [prochlorperazine edisylate]  Family History  Problem Relation Age of Onset  . Hypertension Mother   . Hyperlipidemia Mother   . Cancer Father     Prostate cancer  . Hypertension Father   . Hyperlipidemia Father   . Stroke Maternal Aunt   . Stroke Paternal Aunt   . Diabetes Paternal Aunt     Social History Social History  Substance Use Topics  . Smoking status: Never Smoker  . Smokeless tobacco: Never Used  . Alcohol use Yes  Comment: occasional alcohol use    Review of Systems Constitutional: Fatigue Eyes: No visual changes. ENT: No sore throat. Cardiovascular: Tachycardia Respiratory:  shortness of breath. Gastrointestinal: No abdominal pain.  No nausea, no vomiting.  No diarrhea.  No constipation. Genitourinary: Negative for dysuria. Musculoskeletal: Negative for back pain. Skin: Negative for rash. Neurological: Dizzy and lightheaded  10-point ROS otherwise negative.  ____________________________________________   PHYSICAL EXAM:  VITAL SIGNS: ED Triage Vitals  Enc Vitals Group     BP 03/21/16 2233 137/85     Pulse Rate 03/21/16 2233 86     Resp 03/21/16  2233 20     Temp 03/21/16 2233 98.2 F (36.8 C)     Temp Source 03/21/16 2233 Oral     SpO2 03/21/16 2233 100 %     Weight 03/21/16 2233 146 lb (66.2 kg)     Height 03/21/16 2233 5' (1.524 m)     Head Circumference --      Peak Flow --      Pain Score 03/21/16 2234 8     Pain Loc --      Pain Edu? --      Excl. in [place]? --     Constitutional: Alert and oriented. Well appearing and in Mild distress. Eyes: Conjunctivae are normal. PERRL. EOMI. Head: Atraumatic. Nose: No congestion/rhinnorhea. Mouth/Throat: Mucous membranes are moist.  Oropharynx non-erythematous. Cardiovascular: Normal rate, regular rhythm. Systolic murmur.  Good peripheral circulation. Respiratory: Normal respiratory effort.  No retractions. Lungs CTAB. Gastrointestinal: Soft and nontender. No distention. Positive bowel sounds Musculoskeletal: No lower extremity tenderness nor edema.   Neurologic:  Normal speech and language.  Skin:  Skin is warm, dry and intact.  Psychiatric: Mood and affect are normal.   ____________________________________________   LABS (all labs ordered are listed, but only abnormal results are displayed)  Labs Reviewed  CBC - Abnormal; Notable for the following:       Result Value   Hemoglobin 6.5 (*)    HCT 23.2 (*)    MCV 58.8 (*)    MCH 16.5 (*)    MCHC 28.0 (*)    RDW 29.8 (*)    Platelets 1,248 (*)    All other components within normal limits  BASIC METABOLIC PANEL  VON [place]IL[place]BRAND PANEL  APTT  PROTIME-INR  JAK2 GENOTYPR  TYPE AND SCREEN  PREPARE RBC (CROSSMATCH)  ABO/RH   ____________________________________________  EKG  none ____________________________________________  RADIOLOGY  none ____________________________________________   PROCEDURES  Procedure(s) performed: None  Procedures  Critical Care performed: No  ____________________________________________   INITIAL IMPRESSION / ASSESSMENT AND PLAN / ED COURSE  Pertinent labs & imaging results  that were available during my care of the patient were reviewed by me and considered in my medical decision making (see chart for details).  This is a 45 year old female who comes into the hospital today with some anemia as well as some lightheadedness, dizziness and shortness of breath. It sounds like the patient has some symptomatic anemia. At the doctor's office today the patient's hemoglobin was 6.2. We did repeat it here and it was 6.5 but the patient is still actively bleeding. Given the symptoms I will give the patient a transfusion of one to 2 units. Also given the symptoms I will admit the patient to the hospitalist service. The patient has no other complaints at this time she does feel anxious. I will give her half a milligram of Ativan.  Clinical Course    The patient will be  admitted to the hospitalist service. I discussed this with the patient and Dr. Jannifer Franklin and they both agree with this plan.  ____________________________________________   FINAL CLINICAL IMPRESSION(S) / ED DIAGNOSES  Final diagnoses:  Symptomatic anemia  Thrombocytosis ([place])      NEW MEDICATIONS STARTED DURING THIS VISIT:  New Prescriptions   No medications on file     Note:  This document was prepared using Dragon voice recognition software and may include unintentional dictation errors.    Loney Hering, MD 03/22/16 [phone]

## 2016-03-22 NOTE — Telephone Encounter (Signed)
Pt got discharged even prior to my consultation; reviewed the labs- thrombocytosis likely from [place]; however needs follow up in cancer center with next available provider.   Hassan Rowan- please forward this info to the scheduler. Thanks.

## 2016-03-22 NOTE — Discharge Summary (Addendum)
[facility] [place] NAME: Dana Dunlap    MR#:  [id number]  DATE OF BIRTH:  1970/06/12  DATE OF ADMISSION:  03/21/2016 ADMITTING PHYSICIAN: Lance Coon, MD  DATE OF DISCHARGE: 03/22/16  PRIMARY CARE PHYSICIAN: Coral Spikes, DO    ADMISSION DIAGNOSIS:  Thrombocytosis ([place]) [D47.3] Symptomatic anemia [D64.9]  DISCHARGE DIAGNOSIS:  Principal Problem:   Symptomatic anemia Active Problems:   HTN (hypertension)   Fibroid, uterine   Anxiety   Thrombocytosis ([place])   SECONDARY DIAGNOSIS:   Past Medical History:  Diagnosis Date  . Depression    Currently on Cymbalta  . Fibroids   . Frequent headaches    Worse during monthly menstrual cycle  . Hypertension     HOSPITAL COURSE:  Dana Dunlap  is a 45 y.o. female admitted 03/21/2016 with chief complaint Abnormal Lab . Please see H&P performed by Lance Coon, MD for further information. Patient presented with symptomatic anemia, secondary to fibroids. She has previously been recommended hysterectomy - which she has previously declined. She was transfused 2 unit PRBC and started on iron  DISCHARGE CONDITIONS:   stable  CONSULTS OBTAINED:  Treatment Team:  Cammie Sickle, MD  DRUG ALLERGIES:   Allergies  Allergen Reactions  . Compazine [Prochlorperazine Edisylate] Other (See Comments)    Seizure     DISCHARGE MEDICATIONS:   Current Discharge Medication List    START taking these medications   Details  ferrous sulfate (FEOSOL) 325 (65 FE) MG tablet Take 1 tablet (325 mg total) by mouth 2 (two) times daily with a meal. Qty: 60 tablet, Refills: 3      CONTINUE these medications which have NOT CHANGED   Details  clonazePAM (KLONOPIN) 0.5 MG tablet Take 1 tablet (0.5 mg total) by mouth 2 (two) times daily as needed for anxiety. Qty: 60 tablet, Refills: 0    !! DULoxetine (CYMBALTA) 30 MG capsule Take 1 capsule by mouth  daily , take with 60mg  for  total 90mg   daily Qty: 90 capsule, Refills: 0    !! DULoxetine (CYMBALTA) 60 MG capsule Take 1 capsule by mouth  daily , take with 30mg  for  total 90mg  daily Qty: 90 capsule, Refills: 1     !! - Potential duplicate medications found. Please discuss with provider.    STOP taking these medications     Ferrous Fumarate 90 MG TABS          DISCHARGE INSTRUCTIONS:    DIET:  Regular diet   DISCHARGE CONDITION:  Stable  ACTIVITY:  Activity as tolerated  OXYGEN:  Home Oxygen: No.   Oxygen Delivery: room air  DISCHARGE LOCATION:  home   If you experience worsening of your admission symptoms, develop shortness of breath, life threatening emergency, suicidal or homicidal thoughts you must seek medical attention immediately by calling 911 or calling your MD immediately  if symptoms less severe.  You Must read complete instructions/literature along with all the possible adverse reactions/side effects for all the Medicines you take and that have been prescribed to you. Take any new Medicines after you have completely understood and accpet all the possible adverse reactions/side effects.   Please note  You were cared for by a hospitalist during your hospital stay. If you have any questions about your discharge medications or the care you received while you were in the hospital after you are discharged, you can call the unit and asked to speak with the hospitalist on  call if the hospitalist that took care of you is not available. Once you are discharged, your primary care physician will handle any further medical issues. Please note that NO REFILLS for any discharge medications will be authorized once you are discharged, as it is imperative that you return to your primary care physician (or establish a relationship with a primary care physician if you do not have one) for your aftercare needs so that they can reassess your need for medications and monitor your lab values.    On the day of  Discharge:   VITAL SIGNS:  Blood pressure 135/79, pulse 63, temperature 98.3 F (36.8 C), temperature source Oral, resp. rate 15, height 5' (1.524 m), weight 67.6 kg (149 lb), last menstrual period 03/21/2016, SpO2 100 %.  I/O:   Intake/Output Summary (Last 24 hours) at 03/22/16 1041 Last data filed at 03/22/16 0900  Gross per 24 hour  Intake              840 ml  Output                0 ml  Net              840 ml    PHYSICAL EXAMINATION:  GENERAL:  45 y.o.-year-old patient lying in the bed with no acute distress.  EYES: Pupils equal, round, reactive to light and accommodation. No scleral icterus. Extraocular muscles intact.  HEENT: Head atraumatic, normocephalic. Oropharynx and nasopharynx clear.  NECK:  Supple, no jugular venous distention. No thyroid enlargement, no tenderness.  LUNGS: Normal breath sounds bilaterally, no wheezing, rales,rhonchi or crepitation. No use of accessory muscles of respiration.  CARDIOVASCULAR: S1, S2 normal. No murmurs, rubs, or gallops.  ABDOMEN: Soft, non-tender, non-distended. Bowel sounds present. No organomegaly or mass.  EXTREMITIES: No pedal edema, cyanosis, or clubbing.  NEUROLOGIC: Cranial nerves II through XII are intact. Muscle strength 5/5 in all extremities. Sensation intact. Gait not checked.  PSYCHIATRIC: The patient is alert and oriented x 3.  SKIN: No obvious rash, lesion, or ulcer.   DATA REVIEW:   CBC  Recent Labs Lab 03/21/16 2256  WBC 3.8  HGB 6.5*  HCT 23.2*  PLT 1,248*    Chemistries   Recent Labs Lab 03/21/16 2256  NA 138  K 4.3  CL 107  CO2 22  GLUCOSE 92  BUN 8  CREATININE 0.85  CALCIUM 9.1    Cardiac Enzymes No results for input(s): TROPONINI in the last 168 hours.  Microbiology Results  No results found for this or any previous visit.  RADIOLOGY:  No results found.   Management plans discussed with the patient, family and they are in agreement.  CODE STATUS:     Code Status Orders         Start     Ordered   03/22/16 0209  Full code  Continuous     03/22/16 0208    Code Status History    Date Active Date Inactive Code Status Order ID Comments User Context   This patient has a current code status but no historical code status.      TOTAL TIME TAKING CARE OF THIS PATIENT: 33 minutes.    Hower,  Karenann Cai.D on 03/22/2016 at 10:41 AM  Between 7am to 6pm - Pager - [phone]  After 6pm go to [email or web address] - Proofreader  [facility]  Office  [phone]  CC: Primary care physician; Coral Spikes, DO

## 2016-03-23 LAB — TYPE AND SCREEN
ABO/RH(D): O POS
Antibody Screen: NEGATIVE
Unit division: 0
Unit division: 0

## 2016-03-23 LAB — VON WILLEBRAND PANEL
COAGULATION FACTOR VIII: 176 % — AB (ref 57–163)
RISTOCETIN CO-FACTOR, PLASMA: 59 % (ref 50–200)
Von Willebrand Antigen, Plasma: 97 % (ref 50–200)

## 2016-03-23 LAB — COAG STUDIES INTERP REPORT: PDF IMAGE: 0

## 2016-03-24 ENCOUNTER — Telehealth: Payer: Self-pay | Admitting: Family Medicine

## 2016-03-24 ENCOUNTER — Encounter: Payer: Self-pay | Admitting: Family Medicine

## 2016-03-24 NOTE — Telephone Encounter (Signed)
Patient stated she feels fine , feels strong enough to return to work, patient was afraid because was not made clear to her why the nurses were monitoring her so closely during and after the transfusion , she thought that she was critically ill. Explained to patient that all patient are monitored closely during a transfusion, in case of allergic reaction or rejection , patient also ask why she was given a shot in her abdomen, explained this was to prevent a thrombus (clot ) from forming. Patient was relieved with answer and was advised if she becomes weak or symptomatic again to go directly to ED, by EMS or another responsible driver immediately. FYI

## 2016-03-24 NOTE — Telephone Encounter (Signed)
Pt called and stated that she had a blood transfusion on Tuesday because she was so critical and is scheduled to go back to work today. She wants to know why do they allow you to go back to work so soon after a blood transfusion. She was able to finally sleep last night but she is still feeling weak, and feeling stressed. She is really not sure what is going on. Please advise, thank you  Call pt @ [phone]

## 2016-03-24 NOTE — Telephone Encounter (Signed)
Noted.  Agree with - if any problems to let us know or be reevaluated.

## 2016-03-28 LAB — JAK2 GENOTYPR

## 2016-03-29 ENCOUNTER — Encounter: Payer: Self-pay | Admitting: Family Medicine

## 2016-03-29 ENCOUNTER — Ambulatory Visit (INDEPENDENT_AMBULATORY_CARE_PROVIDER_SITE_OTHER): Payer: 59 | Admitting: Family Medicine

## 2016-03-29 DIAGNOSIS — D259 Leiomyoma of uterus, unspecified: Secondary | ICD-10-CM | POA: Diagnosis not present

## 2016-03-29 DIAGNOSIS — D649 Anemia, unspecified: Secondary | ICD-10-CM | POA: Diagnosis not present

## 2016-03-29 DIAGNOSIS — R5383 Other fatigue: Secondary | ICD-10-CM | POA: Diagnosis not present

## 2016-03-29 MED ORDER — DULOXETINE HCL 30 MG PO CPEP: ORAL_CAPSULE | ORAL | 0 refills | Status: DC

## 2016-03-29 MED ORDER — DULOXETINE HCL 60 MG PO CPEP: ORAL_CAPSULE | ORAL | 1 refills | Status: DC

## 2016-03-29 NOTE — Assessment & Plan Note (Signed)
Improved currently. Hospital course reviewed and summarized as in history of present illness. Meds reconciled today. We had an extensive discussion about treatment options - myomectomy, embolization, hysterectomy, GnRH agonist.  Patient still unsure about how she wants to proceed regarding fibroids. She is still adamant about avoiding hysterectomy. Patient will like to wait until the first of the year to decide. She has an upcoming hematology.

## 2016-03-29 NOTE — Progress Notes (Signed)
Subjective:  Patient ID: Dana Dunlap, female    DOB: 10/20/1970  Age: 45 y.o. MRN: [id number]  CC: Hospital follow up  HPI:  45 year old female with a long-standing history of anemia secondary to fibroids presents for hospital follow-up.  Patient was admitted from 12/19 to 12/20. I saw her on the 19th and she was found to have a hemoglobin of 6.2. Also found to have markedly thrombocytosis with a platelet count of 1382. She was contacted and encouraged to go to the hospital. She was seen and evaluated and was transfused 2 units PRBC's. She had improvement in her symptoms and was discharged in stable condition.  She presents today for follow-up. She states that she's doing much better at this time. Still has fatigue but no palpitations. No current vaginal bleeding. She is back on iron supplementation. She has an upcoming hematology appointment. She is still adamant about not having a hysterectomy. She would like to explore other options. Will discussed today.  Social Hx   Social History   Social History  . Marital status: Single    Spouse name: N/A  . Number of children: N/A  . Years of education: 59   Occupational History  . 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 Smoking status: Never Smoker  . Smokeless tobacco: Never Used  . Alcohol use Yes     Comment: occasional alcohol use  . Drug use: No  . Sexual activity: Yes    Birth control/ protection: Condom   Other Topics Concern  . None   Social History Narrative   Celyna is originally from [place]. She attended [employer] in [place] where she obtained her Therapist, nutritional in Biology in 1995. 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 She is in a long term relationship with her boyfried, [name]. They have been together for 6 years. Dana Dunlap transferred to [facility] with [employer] [facility] in January. She enjoys reading and  she enjoys the outdoors. She loves to travel. 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    Review of Systems  Constitutional: Positive for fatigue.  Cardiovascular: Negative.    Objective:  BP 138/78   Pulse 65   Temp 98.3 F (36.8 C) (Oral)   Resp 14   Wt 154 lb (69.9 kg)   LMP 03/21/2016 (Exact Date) Comment: very heavy  SpO2 97%   BMI 30.08 kg/m   BP/Weight 03/29/2016 03/22/2016 [id number]  Systolic BP 0000000 0000000 -  Diastolic BP 78 74 -  Wt. (Lbs) 154 149 -  BMI 30.08 - 29.1   Physical Exam  Constitutional: She is oriented to person, place, and time. No distress.  Pulmonary/Chest: Effort normal.  Abdominal:  Palpable fibroid uterus extends to the umbilicus.  Neurological: She is alert and oriented to person, place, and time.  Psychiatric: She has a normal mood and affect.  Vitals reviewed.  Lab Results  Component Value Date   WBC 4.4 03/22/2016   HGB 8.9 (L) 03/22/2016   HCT 28.1 (L) 03/22/2016   PLT 1,200 (HH) 03/22/2016   GLUCOSE 106 (H) 03/22/2016   CHOL 171 03/21/2016   TRIG 54.0 03/21/2016   HDL 57.00 03/21/2016   LDLCALC 103 (H) 03/21/2016   ALT 7 03/21/2016   AST 11 03/21/2016   NA 141 03/22/2016   K 3.9 03/22/2016   CL 111 03/22/2016   CREATININE  0.80 03/22/2016   BUN 10 03/22/2016   CO2 24 03/22/2016   TSH 2.46 04/10/2014   INR 1.08 03/22/2016   HGBA1C 5.2 03/21/2016   Assessment & Plan:   Problem List Items Addressed This Visit    Symptomatic anemia    Improved currently. Hospital course reviewed and summarized as in history of present illness. Meds reconciled today. We had an extensive discussion about treatment options - myomectomy, embolization, hysterectomy, GnRH agonist.  Patient still unsure about how she wants to proceed regarding fibroids. She is still adamant about avoiding hysterectomy. Patient will like to wait until the first of the year to decide. She has an upcoming hematology.        Meds ordered this encounter    Medications  . DULoxetine (CYMBALTA) 30 MG capsule    Sig: Take 1 capsule by mouth  daily , take with 60mg  for  total 90mg  daily    Dispense:  90 capsule    Refill:  0  . DULoxetine (CYMBALTA) 60 MG capsule    Sig: Take 1 capsule by mouth  daily , take with 30mg  for  total 90mg  daily    Dispense:  90 capsule    Refill:  1   Follow-up: Return in about 3 months (around 06/27/2016).  [place]

## 2016-03-29 NOTE — Patient Instructions (Signed)
Let me know what you would like to do.  Follow up in 3 months.  Take care   Dr. Lacinda Axon

## 2016-03-29 NOTE — Progress Notes (Signed)
Pre visit review using our clinic review tool, if applicable. No additional management support is needed unless otherwise documented below in the visit note. 

## 2016-03-31 ENCOUNTER — Telehealth: Payer: Self-pay

## 2016-03-31 NOTE — Telephone Encounter (Signed)
Likely secondary to UTI.  Should resolve with treatment.

## 2016-03-31 NOTE — Telephone Encounter (Signed)
[facility] Call 0750 am   Urine , blood in , caller states has blood in urine  , just had blood transufusion on Wednesday.  History of anemia.   Patient went to urgent care today .  She states she is being treated for infection. Placed on antibiotic .   They didn't do hemoglobin.   Patient wants to know why this is happening to her please advise.  I advised patient if any symptoms worsen over the weekend to seek care please advise.

## 2016-04-04 NOTE — Telephone Encounter (Signed)
Patient advised of below and verbalized understanding.  

## 2016-04-10 NOTE — Progress Notes (Signed)
[facility]  Telephone:([phone]) [phone] Fax:(336) [phone]  ID: [name] OB: 03-19-1971  MR#: [id number]  [id number]  Patient Care Team: Coral Spikes, DO as PCP - General (Family Medicine)  CHIEF COMPLAINT: Thrombocytosis, iron deficiency anemia.  INTERVAL HISTORY: Patient is a 46 year old female who was recently admitted to the hospital after severe uterine bleeding secondary to fibroids. She required multiple units of packed red blood cells. Despite recommendations of hysterectomy, patient has adamantly refused. She was also found to have a significantly elevated platelet count. Currently, patient claims of increased cough and congestion but denies fever. She has no neurologic complaints. She has chronic weakness and fatigue. She has no chest pain or shortness of breath. She denies any nausea, vomiting, constipation, or diarrhea. She has no melena or hematochezia. She has no urinary complaints. Patient offers no further specific complaints today.  REVIEW OF SYSTEMS:   Review of Systems  Constitutional: Positive for malaise/fatigue. Negative for fever and weight loss.  HENT: Positive for congestion and sore throat.   Respiratory: Positive for cough. Negative for shortness of breath.   Cardiovascular: Negative.  Negative for chest pain and leg swelling.  Gastrointestinal: Negative.  Negative for abdominal pain and blood in stool.  Genitourinary: Negative.   Musculoskeletal: Negative.   Neurological: Positive for weakness.  Psychiatric/Behavioral: Positive for depression. The patient is nervous/anxious.     As per HPI. Otherwise, a complete review of systems is negative.  PAST MEDICAL HISTORY: Past Medical History:  Diagnosis Date  . Depression    Currently on Cymbalta  . Fibroids   . Frequent headaches    Worse during monthly menstrual cycle  . Hypertension     PAST SURGICAL HISTORY: Past Surgical History:  Procedure Laterality Date  . BREAST MASS  EXCISION     benign    FAMILY HISTORY: Family History  Problem Relation Age of Onset  . Hypertension Mother   . Hyperlipidemia Mother   . Cancer Father     Prostate cancer  . Hypertension Father   . Hyperlipidemia Father   . Stroke Maternal Aunt   . Stroke Paternal Aunt   . Diabetes Paternal Aunt     ADVANCED DIRECTIVES (Y/N):  N  HEALTH MAINTENANCE: Social History  Substance Use Topics  . Smoking status: Never Smoker  . Smokeless tobacco: Never Used  . Alcohol use Yes     Comment: occasional alcohol use     Colonoscopy:  PAP:  Bone density:  Lipid panel:  Allergies  Allergen Reactions  . Compazine [Prochlorperazine Edisylate] Other (See Comments)    Seizure     Current Outpatient Prescriptions  Medication Sig Dispense Refill  . DULoxetine (CYMBALTA) 30 MG capsule Take 1 capsule by mouth  daily , take with 60mg  for  total 90mg  daily 90 capsule 0  . DULoxetine (CYMBALTA) 60 MG capsule Take 1 capsule by mouth  daily , take with 30mg  for  total 90mg  daily 90 capsule 1  . ferrous sulfate (FEOSOL) 325 (65 FE) MG tablet Take 1 tablet (325 mg total) by mouth 2 (two) times daily with a meal. 60 tablet 3  . clonazePAM (KLONOPIN) 0.5 MG tablet Take 1 tablet (0.5 mg total) by mouth 2 (two) times daily as needed for anxiety. (Patient not taking: Reported on 04/12/2016) 60 tablet 0   No current facility-administered medications for this visit.     OBJECTIVE: Vitals:   04/12/16 1531  BP: (!) 148/83  Pulse: 80  Resp: 18  Temp: 99.9  F (37.7 C)     Body mass index is 29.04 kg/m.    ECOG FS:1 - Symptomatic but completely ambulatory  General: Well-developed, well-nourished, no acute distress. Eyes: Pink conjunctiva, anicteric sclera. HEENT: Normocephalic, moist mucous membranes, clear oropharnyx. Lungs: Clear to auscultation bilaterally. Heart: Regular rate and rhythm. No rubs, murmurs, or gallops. Abdomen: Soft, nontender, nondistended. No organomegaly noted,  normoactive bowel sounds. Musculoskeletal: No edema, cyanosis, or clubbing. Neuro: Alert, answering all questions appropriately. Cranial nerves grossly intact. Skin: No rashes or petechiae noted. Psych: Normal affect. Lymphatics: No cervical, calvicular, axillary or inguinal LAD.   LAB RESULTS:  Lab Results  Component Value Date   NA 141 03/22/2016   K 3.9 03/22/2016   CL 111 03/22/2016   CO2 24 03/22/2016   GLUCOSE 106 (H) 03/22/2016   BUN 10 03/22/2016   CREATININE 0.80 03/22/2016   CALCIUM 9.1 03/22/2016   PROT 7.3 03/21/2016   ALBUMIN 4.1 03/21/2016   AST 11 03/21/2016   ALT 7 03/21/2016   ALKPHOS 44 03/21/2016   BILITOT 0.3 03/21/2016   GFRNONAA >60 03/22/2016   GFRAA >60 03/22/2016    Lab Results  Component Value Date   WBC 3.6 04/12/2016   NEUTROABS 2.3 04/11/2014   HGB 8.0 (L) 04/12/2016   HCT 27.0 (L) 04/12/2016   MCV 63.4 (L) 04/12/2016   PLT 490 (H) 04/12/2016   Lab Results  Component Value Date   IRON 10 (L) 04/12/2016   TIBC 408 04/12/2016   IRONPCTSAT 2 (L) 04/12/2016   Lab Results  Component Value Date   FERRITIN 8 (L) 04/12/2016     STUDIES: No results found.  ASSESSMENT: Thrombocytosis, iron deficiency anemia  PLAN:    1. Thrombocytosis: Improved. Likely reactive from active bleeding and severe iron deficiency anemia. JAK-2 mutation was ordered for completeness and is pending at time of dictation. No further intervention is needed.  2. Iron deficiency anemia: Secondary to heavy fibroid bleeding. Despite recommendations, patient has refused hysterectomy. Patient's hemoglobin is significantly improved from admission, but still decreased. Her iron stores are also significantly decreased. The remainder her laboratory work including hemoglobinopathy profile is either negative or within normal limits. Return to clinic in 2 weeks for further evaluation and initiation of Feraheme. 3. Cough/congestion: Encouraged patient to continue OTC  treatments.  Patient expressed understanding and was in agreement with this plan. She also understands that She can call clinic at any time with any questions, concerns, or complaints.    Lloyd Huger, MD   04/14/2016 3:28 PM

## 2016-04-12 ENCOUNTER — Inpatient Hospital Stay: Payer: 59

## 2016-04-12 ENCOUNTER — Inpatient Hospital Stay: Payer: 59 | Attending: Oncology | Admitting: Oncology

## 2016-04-12 VITALS — BP 148/83 | HR 80 | Temp 99.9°F | Resp 18 | Wt 148.7 lb

## 2016-04-12 DIAGNOSIS — R0989 Other specified symptoms and signs involving the circulatory and respiratory systems: Secondary | ICD-10-CM | POA: Diagnosis not present

## 2016-04-12 DIAGNOSIS — R531 Weakness: Secondary | ICD-10-CM | POA: Diagnosis not present

## 2016-04-12 DIAGNOSIS — Z8042 Family history of malignant neoplasm of prostate: Secondary | ICD-10-CM | POA: Insufficient documentation

## 2016-04-12 DIAGNOSIS — F418 Other specified anxiety disorders: Secondary | ICD-10-CM

## 2016-04-12 DIAGNOSIS — R5382 Chronic fatigue, unspecified: Secondary | ICD-10-CM | POA: Insufficient documentation

## 2016-04-12 DIAGNOSIS — R05 Cough: Secondary | ICD-10-CM | POA: Diagnosis not present

## 2016-04-12 DIAGNOSIS — D75839 Thrombocytosis, unspecified: Secondary | ICD-10-CM

## 2016-04-12 DIAGNOSIS — R5381 Other malaise: Secondary | ICD-10-CM | POA: Diagnosis not present

## 2016-04-12 DIAGNOSIS — D473 Essential (hemorrhagic) thrombocythemia: Secondary | ICD-10-CM

## 2016-04-12 DIAGNOSIS — D500 Iron deficiency anemia secondary to blood loss (chronic): Secondary | ICD-10-CM

## 2016-04-12 DIAGNOSIS — D259 Leiomyoma of uterus, unspecified: Secondary | ICD-10-CM | POA: Diagnosis not present

## 2016-04-12 DIAGNOSIS — Z79899 Other long term (current) drug therapy: Secondary | ICD-10-CM | POA: Diagnosis not present

## 2016-04-12 DIAGNOSIS — N938 Other specified abnormal uterine and vaginal bleeding: Secondary | ICD-10-CM | POA: Diagnosis not present

## 2016-04-12 DIAGNOSIS — I10 Essential (primary) hypertension: Secondary | ICD-10-CM | POA: Insufficient documentation

## 2016-04-12 DIAGNOSIS — D509 Iron deficiency anemia, unspecified: Secondary | ICD-10-CM

## 2016-04-12 LAB — CBC
HEMATOCRIT: 27 % — AB (ref 35.0–47.0)
Hemoglobin: 8 g/dL — ABNORMAL LOW (ref 12.0–16.0)
MCH: 18.9 pg — ABNORMAL LOW (ref 26.0–34.0)
MCHC: 29.8 g/dL — ABNORMAL LOW (ref 32.0–36.0)
MCV: 63.4 fL — ABNORMAL LOW (ref 80.0–100.0)
Platelets: 490 10*3/uL — ABNORMAL HIGH (ref 150–440)
RBC: 4.25 MIL/uL (ref 3.80–5.20)
RDW: 32.2 % — ABNORMAL HIGH (ref 11.5–14.5)
WBC: 3.6 10*3/uL (ref 3.6–11.0)

## 2016-04-12 LAB — LACTATE DEHYDROGENASE: LDH: 241 U/L — ABNORMAL HIGH (ref 98–192)

## 2016-04-12 LAB — DAT, POLYSPECIFIC AHG (ARMC ONLY): POLYSPECIFIC AHG TEST: NEGATIVE

## 2016-04-12 LAB — VITAMIN B12: Vitamin B-12: 484 pg/mL (ref 180–914)

## 2016-04-12 LAB — IRON AND TIBC
Iron: 10 ug/dL — ABNORMAL LOW (ref 28–170)
SATURATION RATIOS: 2 % — AB (ref 10.4–31.8)
TIBC: 408 ug/dL (ref 250–450)
UIBC: 398 ug/dL

## 2016-04-12 LAB — FOLATE: FOLATE: 25 ng/mL (ref >5.9)

## 2016-04-12 LAB — RETICULOCYTES
RBC.: 4.25 MIL/uL (ref 3.80–5.20)
RETIC COUNT ABSOLUTE: 63.8 10*3/uL (ref 19.0–183.0)
Retic Ct Pct: 1.5 % (ref 0.4–3.1)

## 2016-04-12 LAB — FERRITIN: FERRITIN: 8 ng/mL — AB (ref 11–307)

## 2016-04-12 NOTE — Progress Notes (Signed)
Patient is here as a new patient  She has FMLA papers to be filled out for her job, she has a very stressful job. She has URI symptoms

## 2016-04-13 LAB — HEMOGLOBINOPATHY EVALUATION
HGB A: 98 % (ref 96.4–98.8)
HGB C: 0 %
HGB VARIANT: 0 %
Hgb A2 Quant: 2 % (ref 1.8–3.2)
Hgb F Quant: 0 % (ref 0.0–2.0)
Hgb S Quant: 0 %

## 2016-04-13 LAB — HAPTOGLOBIN: Haptoglobin: 106 mg/dL (ref 34–200)

## 2016-04-13 LAB — ERYTHROPOIETIN: Erythropoietin: 122.3 m[IU]/mL — ABNORMAL HIGH (ref 2.6–18.5)

## 2016-04-28 LAB — JAK2 EXONS 12-15

## 2016-04-28 LAB — JAK2  V617F QUAL. WITH REFLEX TO EXON 12

## 2016-04-30 NOTE — Progress Notes (Signed)
[place]  [phone] [phone]  ID: Dana Dunlap OB: [birth year]  MR#: [id number]  [id number]  Patient Care Team: Coral Spikes, DO as PCP - General (Family Medicine)  CHIEF COMPLAINT: Thrombocytosis, iron deficiency anemia.  INTERVAL HISTORY: Patient returns to clinic today for repeat laboratory work, further evaluation, and consideration of IV Feraheme. She currently feels well. She has no neurologic complaints. She has chronic weakness and fatigue. She has no chest pain or shortness of breath. She denies any nausea, vomiting, constipation, or diarrhea. She has no melena or hematochezia. She has no urinary complaints. Patient offers no further specific complaints today.  REVIEW OF SYSTEMS:   Review of Systems  Constitutional: Positive for malaise/fatigue. Negative for fever and weight loss.  HENT: Negative.  Negative for congestion and sore throat.   Respiratory: Negative.  Negative for cough and shortness of breath.   Cardiovascular: Negative.  Negative for chest pain and leg swelling.  Gastrointestinal: Negative.  Negative for abdominal pain and blood in stool.  Genitourinary: Negative.   Musculoskeletal: Negative.   Neurological: Positive for weakness.  Psychiatric/Behavioral: Negative for depression. The patient is nervous/anxious.     As per HPI. Otherwise, a complete review of systems is negative.  PAST MEDICAL HISTORY: Past Medical History:  Diagnosis Date  . Depression    Currently on Cymbalta  . Fibroids   . Frequent headaches    Worse during monthly menstrual cycle  . Hypertension     PAST SURGICAL HISTORY: Past Surgical History:  Procedure Laterality Date  . BREAST MASS EXCISION     benign    FAMILY HISTORY: Family History  Problem Relation Age of Onset  . Hypertension Mother   . Hyperlipidemia Mother   . Cancer Father     Prostate cancer  . Hypertension Father   . Hyperlipidemia Father   . Stroke  Maternal Aunt   . Stroke Paternal Aunt   . Diabetes Paternal Aunt     ADVANCED DIRECTIVES (Y/N):  N  HEALTH MAINTENANCE: Social History  Substance Use Topics  . Smoking status: Never Smoker  . Smokeless tobacco: Never Used  . Alcohol use Yes     Comment: occasional alcohol use     Colonoscopy:  PAP:  Bone density:  Lipid panel:  Allergies  Allergen Reactions  . Compazine [Prochlorperazine Edisylate] Other (See Comments)    Seizure     Current Outpatient Prescriptions  Medication Sig Dispense Refill  . clonazePAM (KLONOPIN) 0.5 MG tablet Take 1 tablet (0.5 mg total) by mouth 2 (two) times daily as needed for anxiety. 60 tablet 0  . DULoxetine (CYMBALTA) 30 MG capsule Take 1 capsule by mouth  daily , take with 60mg  for  total 90mg  daily 90 capsule 0  . DULoxetine (CYMBALTA) 60 MG capsule Take 1 capsule by mouth  daily , take with 30mg  for  total 90mg  daily 90 capsule 1  . ferrous sulfate (FEOSOL) 325 (65 FE) MG tablet Take 1 tablet (325 mg total) by mouth 2 (two) times daily with a meal. 60 tablet 3   No current facility-administered medications for this visit.     OBJECTIVE: Vitals:   05/01/16 1344  BP: 132/83  Pulse: 98  Temp: 98 F (36.7 C)     Body mass index is 29.58 kg/m.    ECOG FS:1 - Symptomatic but completely ambulatory  General: Well-developed, well-nourished, no acute distress. Eyes: Pink conjunctiva, anicteric sclera. HEENT: Normocephalic, moist mucous membranes, clear oropharnyx. Lungs: Clear to  auscultation bilaterally. Heart: Regular rate and rhythm. No rubs, murmurs, or gallops. Abdomen: Soft, nontender, nondistended. No organomegaly noted, normoactive bowel sounds. Musculoskeletal: No edema, cyanosis, or clubbing. Neuro: Alert, answering all questions appropriately. Cranial nerves grossly intact. Skin: No rashes or petechiae noted. Psych: Normal affect. Lymphatics: No cervical, calvicular, axillary or inguinal LAD.   LAB RESULTS:  Lab  Results  Component Value Date   NA 141 03/22/2016   K 3.9 03/22/2016   CL 111 03/22/2016   CO2 24 03/22/2016   GLUCOSE 106 (H) 03/22/2016   BUN 10 03/22/2016   CREATININE 0.80 03/22/2016   CALCIUM 9.1 03/22/2016   PROT 7.3 03/21/2016   ALBUMIN 4.1 03/21/2016   AST 11 03/21/2016   ALT 7 03/21/2016   ALKPHOS 44 03/21/2016   BILITOT 0.3 03/21/2016   GFRNONAA >60 03/22/2016   GFRAA >60 03/22/2016    Lab Results  Component Value Date   WBC 3.6 04/12/2016   NEUTROABS 2.3 04/11/2014   HGB 8.0 (L) 04/12/2016   HCT 27.0 (L) 04/12/2016   MCV 63.4 (L) 04/12/2016   PLT 490 (H) 04/12/2016   Lab Results  Component Value Date   IRON 10 (L) 04/12/2016   TIBC 408 04/12/2016   IRONPCTSAT 2 (L) 04/12/2016   Lab Results  Component Value Date   FERRITIN 8 (L) 04/12/2016     STUDIES: No results found.  ASSESSMENT: Thrombocytosis, iron deficiency anemia  PLAN:    1. Thrombocytosis: Improving. Likely reactive from active bleeding and severe iron deficiency anemia. JAK-2 mutation is negative. No further intervention is needed.  2. Iron deficiency anemia: Secondary to heavy fibroid bleeding. Despite recommendations, patient has refused hysterectomy. Patient's hemoglobin is significantly improved from admission, but still decreased. Her iron stores are also significantly decreased. The remainder her laboratory work including hemoglobinopathy profile is either negative or within normal limits. Proceed with 510 mg IV Feraheme today. Return to clinic in 1 week for a second infusion. Patient will then return to clinic in 2 months with repeat laboratory work and further evaluation.  Approximately 30 minutes was spent in discussion of which greater than 50% was consultation.  Patient expressed understanding and was in agreement with this plan. She also understands that She can call clinic at any time with any questions, concerns, or complaints.    Lloyd Huger, MD   05/04/2016 5:54  PM

## 2016-05-01 ENCOUNTER — Inpatient Hospital Stay: Payer: 59

## 2016-05-01 ENCOUNTER — Inpatient Hospital Stay (HOSPITAL_BASED_OUTPATIENT_CLINIC_OR_DEPARTMENT_OTHER): Payer: 59 | Admitting: Oncology

## 2016-05-01 VITALS — BP 132/83 | HR 98 | Temp 98.0°F | Wt 151.5 lb

## 2016-05-01 DIAGNOSIS — D508 Other iron deficiency anemias: Secondary | ICD-10-CM

## 2016-05-01 DIAGNOSIS — D259 Leiomyoma of uterus, unspecified: Secondary | ICD-10-CM

## 2016-05-01 DIAGNOSIS — R5381 Other malaise: Secondary | ICD-10-CM

## 2016-05-01 DIAGNOSIS — D500 Iron deficiency anemia secondary to blood loss (chronic): Secondary | ICD-10-CM | POA: Diagnosis not present

## 2016-05-01 DIAGNOSIS — R531 Weakness: Secondary | ICD-10-CM

## 2016-05-01 DIAGNOSIS — N938 Other specified abnormal uterine and vaginal bleeding: Secondary | ICD-10-CM | POA: Diagnosis not present

## 2016-05-01 DIAGNOSIS — D473 Essential (hemorrhagic) thrombocythemia: Secondary | ICD-10-CM

## 2016-05-01 DIAGNOSIS — Z79899 Other long term (current) drug therapy: Secondary | ICD-10-CM

## 2016-05-01 DIAGNOSIS — D75839 Thrombocytosis, unspecified: Secondary | ICD-10-CM

## 2016-05-01 DIAGNOSIS — F418 Other specified anxiety disorders: Secondary | ICD-10-CM

## 2016-05-01 DIAGNOSIS — R0989 Other specified symptoms and signs involving the circulatory and respiratory systems: Secondary | ICD-10-CM

## 2016-05-01 DIAGNOSIS — I10 Essential (primary) hypertension: Secondary | ICD-10-CM

## 2016-05-01 DIAGNOSIS — R5382 Chronic fatigue, unspecified: Secondary | ICD-10-CM

## 2016-05-01 DIAGNOSIS — Z8042 Family history of malignant neoplasm of prostate: Secondary | ICD-10-CM

## 2016-05-01 DIAGNOSIS — R05 Cough: Secondary | ICD-10-CM

## 2016-05-01 MED ORDER — SODIUM CHLORIDE 0.9 % IV SOLN
Freq: Once | INTRAVENOUS | Status: AC
  Administered 2016-05-01: 15:00:00 via INTRAVENOUS
  Filled 2016-05-01: qty 1000

## 2016-05-01 MED ORDER — FERUMOXYTOL INJECTION 510 MG/17 ML
510.0000 mg | Freq: Once | INTRAVENOUS | Status: AC
  Administered 2016-05-01: 510 mg via INTRAVENOUS
  Filled 2016-05-01: qty 17

## 2016-05-01 NOTE — Progress Notes (Signed)
Patient states she is having a lot of stress at work.  She is currently on intermittent FMLA and states her supervisor keeps telling her she needs to be giving 150% to her job.  She states she is having abdominal cramps today 9/10 due to being on her monthly period.

## 2016-05-08 ENCOUNTER — Inpatient Hospital Stay: Payer: 59 | Attending: Oncology

## 2016-05-08 VITALS — BP 120/82 | HR 60 | Temp 97.8°F | Resp 16

## 2016-05-08 DIAGNOSIS — D500 Iron deficiency anemia secondary to blood loss (chronic): Secondary | ICD-10-CM | POA: Insufficient documentation

## 2016-05-08 DIAGNOSIS — D473 Essential (hemorrhagic) thrombocythemia: Secondary | ICD-10-CM | POA: Insufficient documentation

## 2016-05-08 DIAGNOSIS — D508 Other iron deficiency anemias: Secondary | ICD-10-CM

## 2016-05-08 MED ORDER — SODIUM CHLORIDE 0.9 % IV SOLN
510.0000 mg | Freq: Once | INTRAVENOUS | Status: AC
  Administered 2016-05-08: 510 mg via INTRAVENOUS
  Filled 2016-05-08: qty 17

## 2016-05-08 MED ORDER — SODIUM CHLORIDE 0.9 % IV SOLN
Freq: Once | INTRAVENOUS | Status: AC
  Administered 2016-05-08: 14:00:00 via INTRAVENOUS
  Filled 2016-05-08: qty 1000

## 2016-05-11 ENCOUNTER — Telehealth: Payer: Self-pay | Admitting: Family Medicine

## 2016-05-11 NOTE — Telephone Encounter (Signed)
Pt dropped off FMLA papers to be completed by Dr. Lacinda Axon. Papers are up front in Dr. Jonathon Jordan color folder.

## 2016-05-12 NOTE — Telephone Encounter (Signed)
Placed in Dr.cooks red folder.

## 2016-05-12 NOTE — Telephone Encounter (Signed)
Pt was called and told paperwork was faxed. She is going to pick hard copy up at the front desk.

## 2016-05-12 NOTE — Telephone Encounter (Signed)
Form filled out. Given to [employer] to fax.

## 2016-06-26 ENCOUNTER — Inpatient Hospital Stay: Payer: 59 | Attending: Internal Medicine

## 2016-06-26 DIAGNOSIS — D473 Essential (hemorrhagic) thrombocythemia: Secondary | ICD-10-CM | POA: Diagnosis not present

## 2016-06-26 DIAGNOSIS — F418 Other specified anxiety disorders: Secondary | ICD-10-CM | POA: Insufficient documentation

## 2016-06-26 DIAGNOSIS — D500 Iron deficiency anemia secondary to blood loss (chronic): Secondary | ICD-10-CM | POA: Diagnosis not present

## 2016-06-26 DIAGNOSIS — I10 Essential (primary) hypertension: Secondary | ICD-10-CM | POA: Insufficient documentation

## 2016-06-26 DIAGNOSIS — R5383 Other fatigue: Secondary | ICD-10-CM | POA: Diagnosis not present

## 2016-06-26 DIAGNOSIS — R531 Weakness: Secondary | ICD-10-CM | POA: Insufficient documentation

## 2016-06-26 DIAGNOSIS — Z79899 Other long term (current) drug therapy: Secondary | ICD-10-CM | POA: Diagnosis not present

## 2016-06-26 DIAGNOSIS — D75839 Thrombocytosis, unspecified: Secondary | ICD-10-CM

## 2016-06-26 DIAGNOSIS — R5381 Other malaise: Secondary | ICD-10-CM | POA: Diagnosis not present

## 2016-06-26 DIAGNOSIS — N920 Excessive and frequent menstruation with regular cycle: Secondary | ICD-10-CM | POA: Insufficient documentation

## 2016-06-26 DIAGNOSIS — D259 Leiomyoma of uterus, unspecified: Secondary | ICD-10-CM | POA: Insufficient documentation

## 2016-06-26 DIAGNOSIS — Z8042 Family history of malignant neoplasm of prostate: Secondary | ICD-10-CM | POA: Diagnosis not present

## 2016-06-26 DIAGNOSIS — D508 Other iron deficiency anemias: Secondary | ICD-10-CM

## 2016-06-26 LAB — CBC WITH DIFFERENTIAL/PLATELET
BASOS ABS: 0 10*3/uL (ref 0–0.1)
BASOS PCT: 1 %
EOS ABS: 0.2 10*3/uL (ref 0–0.7)
Eosinophils Relative: 6 %
HEMATOCRIT: 25.2 % — AB (ref 35.0–47.0)
HEMOGLOBIN: 8.1 g/dL — AB (ref 12.0–16.0)
Lymphocytes Relative: 35 %
Lymphs Abs: 1 10*3/uL (ref 1.0–3.6)
MCH: 22.3 pg — ABNORMAL LOW (ref 26.0–34.0)
MCHC: 32.2 g/dL (ref 32.0–36.0)
MCV: 69.2 fL — ABNORMAL LOW (ref 80.0–100.0)
MONO ABS: 0.2 10*3/uL (ref 0.2–0.9)
Monocytes Relative: 9 %
NEUTROS ABS: 1.4 10*3/uL (ref 1.4–6.5)
NEUTROS PCT: 49 %
Platelets: 365 10*3/uL (ref 150–440)
RBC: 3.64 MIL/uL — ABNORMAL LOW (ref 3.80–5.20)
RDW: 24.5 % — AB (ref 11.5–14.5)
WBC: 2.8 10*3/uL — ABNORMAL LOW (ref 3.6–11.0)

## 2016-06-26 LAB — IRON AND TIBC
Iron: 15 ug/dL — ABNORMAL LOW (ref 28–170)
SATURATION RATIOS: 5 % — AB (ref 10.4–31.8)
TIBC: 281 ug/dL (ref 250–450)
UIBC: 266 ug/dL

## 2016-06-26 LAB — FERRITIN: Ferritin: 15 ng/mL (ref 11–307)

## 2016-06-27 ENCOUNTER — Ambulatory Visit (INDEPENDENT_AMBULATORY_CARE_PROVIDER_SITE_OTHER): Payer: 59 | Admitting: Family Medicine

## 2016-06-27 ENCOUNTER — Encounter: Payer: Self-pay | Admitting: Family Medicine

## 2016-06-27 ENCOUNTER — Other Ambulatory Visit: Payer: Self-pay | Admitting: Oncology

## 2016-06-27 DIAGNOSIS — E785 Hyperlipidemia, unspecified: Secondary | ICD-10-CM | POA: Insufficient documentation

## 2016-06-27 DIAGNOSIS — D259 Leiomyoma of uterus, unspecified: Secondary | ICD-10-CM

## 2016-06-27 DIAGNOSIS — D508 Other iron deficiency anemias: Secondary | ICD-10-CM | POA: Diagnosis not present

## 2016-06-27 DIAGNOSIS — F419 Anxiety disorder, unspecified: Secondary | ICD-10-CM

## 2016-06-27 DIAGNOSIS — D473 Essential (hemorrhagic) thrombocythemia: Secondary | ICD-10-CM | POA: Diagnosis not present

## 2016-06-27 DIAGNOSIS — D75839 Thrombocytosis, unspecified: Secondary | ICD-10-CM

## 2016-06-27 NOTE — Progress Notes (Signed)
Subjective:  Patient ID: Dana Dunlap, female    DOB: 1970/06/28  Age: 46 y.o. MRN: [id number]  CC: Follow up  HPI:  46 year old female with hypertension, fibroid/menorrhagia, iron deficiency anemia, anxiety, thrombocytosis presents for follow-up.  Anxiety  Stable at this time on Cymbalta and Klonopin.  Iron deficiency anemia  Secondary to uterine fibroids/menorrhagia.  Hemoglobin currently stable.  She is following closely with hematology for IV iron infusions.  She continues to be adamant against surgery.  We will discuss this today.  Thrombocytosis  Now resolved following improvement in hemoglobin.  Social Hx   Social History   Social History  . Marital status: Single    Spouse name: N/A  . Number of children: N/A  . Years of education: 23   Occupational History  . 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 Smoking status: Never Smoker  . Smokeless tobacco: Never Used  . Alcohol use Yes     Comment: occasional alcohol use  . Drug use: No  . Sexual activity: Yes    Birth control/ protection: Condom   Other Topics Concern  . None   Social History Narrative   Brittnie is originally from [place]. She attended [employer] in [place] where she obtained her Therapist, nutritional in Biology in 1995. 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 She is in a long term relationship with her boyfried, [name]. They have been together for 6 years. [name] transferred to [employer] with [employer] in January. She enjoys reading and she enjoys the outdoors. She loves to travel. 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    Review of Systems  Constitutional: Positive for fatigue.  Respiratory: Positive for shortness of breath.    Objective:  BP 134/78 (BP Location: Left Arm, Patient Position: Sitting, Cuff Size: Normal)   Pulse 62   Temp 98 F (36.7 C)  (Oral)   Resp 16   Wt 156 lb 8 oz (71 kg)   LMP 03/21/2016 (Exact Date) Comment: very heavy  SpO2 99%   BMI 30.56 kg/m   BP/Weight 06/27/2016 05/08/2016 5/80/9983  Systolic BP 382 505 397  Diastolic BP 78 82 83  Wt. (Lbs) 156.5 - 151.46  BMI 30.56 - 29.58   Physical Exam  Constitutional: She is oriented to person, place, and time. She appears well-developed. No distress.  Cardiovascular: Normal rate and regular rhythm.   Murmur heard. Pulmonary/Chest: Effort normal and breath sounds normal.  Neurological: She is alert and oriented to person, place, and time.  Psychiatric:  Flat affect.  Vitals reviewed.   Lab Results  Component Value Date   WBC 2.8 (L) 06/26/2016   HGB 8.1 (L) 06/26/2016   HCT 25.2 (L) 06/26/2016   PLT 365 06/26/2016   GLUCOSE 106 (H) 03/22/2016   CHOL 171 03/21/2016   TRIG 54.0 03/21/2016   HDL 57.00 03/21/2016   LDLCALC 103 (H) 03/21/2016   ALT 7 03/21/2016   AST 11 03/21/2016   NA 141 03/22/2016   K 3.9 03/22/2016   CL 111 03/22/2016   CREATININE 0.80 03/22/2016   BUN 10 03/22/2016   CO2 24 03/22/2016   TSH 2.46 04/10/2014   INR 1.08 03/22/2016   HGBA1C 5.2 03/21/2016    Assessment & Plan:   Problem List Items Addressed This Visit    Fibroid, uterine    This is  the culprit of her problem. I advised her to consider some sort of intervention whether it be medication, embolization, myomectomy, hysterectomy. Patient is still very reluctant to proceed with any intervention especially surgery. We'll refer to GYN for further conversation and hopeful intervention in the near future.      Relevant Orders   Ambulatory referral to Gynecology   Anemia, iron deficiency    Stable at this time. Most recent hemoglobin as of yesterday was 8.1. Continues to have significant iron deficiency. Advised her to continue to follow with hematology for IV iron infusions. Strongly encouraged her to consider surgery. Patient still very reluctant.      Anxiety      Stable. Continue Cymbalta and Klonopin.      Thrombocytosis ([place])    Now resolved following improvement in hemoglobin.         Follow-up: Return in about 6 months (around 12/28/2016).  [place]

## 2016-06-27 NOTE — Progress Notes (Signed)
Pre visit review using our clinic review tool, if applicable. No additional management support is needed unless otherwise documented below in the visit note. 

## 2016-06-27 NOTE — Assessment & Plan Note (Signed)
Now resolved following improvement in hemoglobin.

## 2016-06-27 NOTE — Assessment & Plan Note (Signed)
Stable at this time. Most recent hemoglobin as of yesterday was 8.1. Continues to have significant iron deficiency. Advised her to continue to follow with hematology for IV iron infusions. Strongly encouraged her to consider surgery. Patient still very reluctant.

## 2016-06-27 NOTE — Patient Instructions (Signed)
Continue your meds.  We will call with your referral.  Follow up in 6 months.  Take care  Dr. Lacinda Axon

## 2016-06-27 NOTE — Assessment & Plan Note (Signed)
This is the culprit of her problem. I advised her to consider some sort of intervention whether it be medication, embolization, myomectomy, hysterectomy. Patient is still very reluctant to proceed with any intervention especially surgery. We'll refer to GYN for further conversation and hopeful intervention in the near future.

## 2016-06-27 NOTE — Assessment & Plan Note (Signed)
Stable. Continue Cymbalta and Klonopin.

## 2016-06-27 NOTE — Progress Notes (Signed)
[place]  [phone] [phone]) [phone]  ID: Dana Dunlap OB: 17-Apr-1970  MR#: [id number]  AOZ#:[id number]  Patient Care Team: Coral Spikes, DO as PCP - General (Family Medicine)  CHIEF COMPLAINT: Thrombocytosis, iron deficiency anemia.  INTERVAL HISTORY: Patient returns to clinic today for repeat laboratory work, further evaluation, and consideration of IV Feraheme. She is having increased pain secondary to actively having heavy and sees. She feels increased weakness and fatigue. She has no neurologic complaints. She has no chest pain or shortness of breath. She denies any nausea, vomiting, constipation, or diarrhea. She has no melena or hematochezia. She has no urinary complaints. Patient offers no further specific complaints today.  REVIEW OF SYSTEMS:   Review of Systems  Constitutional: Positive for malaise/fatigue. Negative for fever and weight loss.  HENT: Negative.  Negative for congestion and sore throat.   Respiratory: Negative.  Negative for cough and shortness of breath.   Cardiovascular: Negative.  Negative for chest pain and leg swelling.  Gastrointestinal: Negative.  Negative for abdominal pain and blood in stool.  Genitourinary: Negative.   Musculoskeletal: Negative.   Neurological: Positive for weakness.  Psychiatric/Behavioral: Negative for depression. The patient is nervous/anxious.     As per HPI. Otherwise, a complete review of systems is negative.  PAST MEDICAL HISTORY: Past Medical History:  Diagnosis Date  . Depression    Currently on Cymbalta  . Fibroids   . Frequent headaches    Worse during monthly menstrual cycle  . Hypertension     PAST SURGICAL HISTORY: Past Surgical History:  Procedure Laterality Date  . BREAST MASS EXCISION     benign    FAMILY HISTORY: Family History  Problem Relation Age of Onset  . Hypertension Mother   . Hyperlipidemia Mother   . Cancer Father     Prostate cancer  . Hypertension  Father   . Hyperlipidemia Father   . Stroke Maternal Aunt   . Stroke Paternal Aunt   . Diabetes Paternal Aunt     ADVANCED DIRECTIVES (Y/N):  N  HEALTH MAINTENANCE: Social History  Substance Use Topics  . Smoking status: Never Smoker  . Smokeless tobacco: Never Used  . Alcohol use Yes     Comment: occasional alcohol use     Colonoscopy:  PAP:  Bone density:  Lipid panel:  Allergies  Allergen Reactions  . Compazine [Prochlorperazine Edisylate] Other (See Comments)    Seizure     Current Outpatient Prescriptions  Medication Sig Dispense Refill  . clonazePAM (KLONOPIN) 0.5 MG tablet Take 1 tablet (0.5 mg total) by mouth 2 (two) times daily as needed for anxiety. 60 tablet 0  . DULoxetine (CYMBALTA) 30 MG capsule Take 1 capsule by mouth  daily , take with 60mg  for  total 90mg  daily 90 capsule 0  . DULoxetine (CYMBALTA) 60 MG capsule Take 1 capsule by mouth  daily , take with 30mg  for  total 90mg  daily 90 capsule 1  . ferrous sulfate (FEOSOL) 325 (65 FE) MG tablet Take 1 tablet (325 mg total) by mouth 2 (two) times daily with a meal. 60 tablet 3   No current facility-administered medications for this visit.     OBJECTIVE: Vitals:   06/28/16 1430  BP: (!) 158/78  Pulse: 76  Resp: 18  Temp: 97.6 F (36.4 C)     Body mass index is 29.55 kg/m.    ECOG FS:1 - Symptomatic but completely ambulatory  General: Well-developed, well-nourished, no acute distress. Eyes: Pink conjunctiva,  anicteric sclera. Lungs: Clear to auscultation bilaterally. Heart: Regular rate and rhythm. No rubs, murmurs, or gallops. Abdomen: Soft, nontender, nondistended. No organomegaly noted, normoactive bowel sounds. Musculoskeletal: No edema, cyanosis, or clubbing. Neuro: Alert, answering all questions appropriately. Cranial nerves grossly intact. Skin: No rashes or petechiae noted. Psych: Normal affect.   LAB RESULTS:  Lab Results  Component Value Date   NA 141 03/22/2016   K 3.9  03/22/2016   CL 111 03/22/2016   CO2 24 03/22/2016   GLUCOSE 106 (H) 03/22/2016   BUN 10 03/22/2016   CREATININE 0.80 03/22/2016   CALCIUM 9.1 03/22/2016   PROT 7.3 03/21/2016   ALBUMIN 4.1 03/21/2016   AST 11 03/21/2016   ALT 7 03/21/2016   ALKPHOS 44 03/21/2016   BILITOT 0.3 03/21/2016   GFRNONAA >60 03/22/2016   GFRAA >60 03/22/2016    Lab Results  Component Value Date   WBC 2.8 (L) 06/26/2016   NEUTROABS 1.4 06/26/2016   HGB 8.1 (L) 06/26/2016   HCT 25.2 (L) 06/26/2016   MCV 69.2 (L) 06/26/2016   PLT 365 06/26/2016   Lab Results  Component Value Date   IRON 15 (L) 06/26/2016   TIBC 281 06/26/2016   IRONPCTSAT 5 (L) 06/26/2016   Lab Results  Component Value Date   FERRITIN 15 06/26/2016     STUDIES: No results found.  ASSESSMENT: Thrombocytosis, iron deficiency anemia  PLAN:    1. Thrombocytosis: Resolved. Likely reactive from active bleeding and severe iron deficiency anemia. JAK-2 mutation is negative. No further intervention is needed.  2. Iron deficiency anemia: Secondary to heavy fibroid bleeding. Despite recommendations, patient has refused hysterectomy. Patient's hemoglobin is significantly improved from admission, but still decreased. Her iron stores are also significantly decreased. The remainder her laboratory work including hemoglobinopathy profile is either negative or within normal limits. Proceed with 510 mg IV Feraheme today. Return to clinic in 1 week for a second infusion. Patient will then return to clinic in 6 weeks with repeat laboratory work and further evaluation. 3. Fibroids: Patient declining hysterectomy at this time. Continue follow-up per gynecology.   Patient expressed understanding and was in agreement with this plan. She also understands that She can call clinic at any time with any questions, concerns, or complaints.    Lloyd Huger, MD   07/04/2016 5:27 PM

## 2016-06-28 ENCOUNTER — Inpatient Hospital Stay: Payer: 59

## 2016-06-28 ENCOUNTER — Inpatient Hospital Stay (HOSPITAL_BASED_OUTPATIENT_CLINIC_OR_DEPARTMENT_OTHER): Payer: 59 | Admitting: Oncology

## 2016-06-28 ENCOUNTER — Encounter: Payer: Self-pay | Admitting: Oncology

## 2016-06-28 VITALS — BP 158/78 | HR 76 | Temp 97.6°F | Resp 18 | Ht 61.0 in | Wt 156.4 lb

## 2016-06-28 VITALS — BP 127/81 | HR 59

## 2016-06-28 DIAGNOSIS — D473 Essential (hemorrhagic) thrombocythemia: Secondary | ICD-10-CM

## 2016-06-28 DIAGNOSIS — R5383 Other fatigue: Secondary | ICD-10-CM | POA: Diagnosis not present

## 2016-06-28 DIAGNOSIS — D75839 Thrombocytosis, unspecified: Secondary | ICD-10-CM

## 2016-06-28 DIAGNOSIS — D508 Other iron deficiency anemias: Secondary | ICD-10-CM

## 2016-06-28 DIAGNOSIS — I10 Essential (primary) hypertension: Secondary | ICD-10-CM | POA: Diagnosis not present

## 2016-06-28 DIAGNOSIS — R5381 Other malaise: Secondary | ICD-10-CM

## 2016-06-28 DIAGNOSIS — F418 Other specified anxiety disorders: Secondary | ICD-10-CM

## 2016-06-28 DIAGNOSIS — N920 Excessive and frequent menstruation with regular cycle: Secondary | ICD-10-CM | POA: Diagnosis not present

## 2016-06-28 DIAGNOSIS — Z8042 Family history of malignant neoplasm of prostate: Secondary | ICD-10-CM | POA: Diagnosis not present

## 2016-06-28 DIAGNOSIS — R531 Weakness: Secondary | ICD-10-CM

## 2016-06-28 DIAGNOSIS — Z79899 Other long term (current) drug therapy: Secondary | ICD-10-CM

## 2016-06-28 DIAGNOSIS — D259 Leiomyoma of uterus, unspecified: Secondary | ICD-10-CM | POA: Diagnosis not present

## 2016-06-28 DIAGNOSIS — D500 Iron deficiency anemia secondary to blood loss (chronic): Secondary | ICD-10-CM

## 2016-06-28 MED ORDER — SODIUM CHLORIDE 0.9 % IV SOLN
Freq: Once | INTRAVENOUS | Status: AC
  Administered 2016-06-28: 15:00:00 via INTRAVENOUS
  Filled 2016-06-28: qty 1000

## 2016-06-28 MED ORDER — SODIUM CHLORIDE 0.9 % IV SOLN
510.0000 mg | Freq: Once | INTRAVENOUS | Status: AC
  Administered 2016-06-28: 510 mg via INTRAVENOUS
  Filled 2016-06-28: qty 17

## 2016-06-28 NOTE — Progress Notes (Signed)
She is hurting today because of her heavy period and take ibuprofen for it during the first 3 days of it. More tired because of her menses.

## 2016-06-29 ENCOUNTER — Encounter: Payer: Self-pay | Admitting: Family Medicine

## 2016-07-10 ENCOUNTER — Inpatient Hospital Stay: Payer: 59

## 2016-07-13 ENCOUNTER — Inpatient Hospital Stay: Payer: 59

## 2016-07-31 ENCOUNTER — Inpatient Hospital Stay: Payer: 59 | Attending: Oncology

## 2016-07-31 DIAGNOSIS — D500 Iron deficiency anemia secondary to blood loss (chronic): Secondary | ICD-10-CM | POA: Insufficient documentation

## 2016-07-31 DIAGNOSIS — D508 Other iron deficiency anemias: Secondary | ICD-10-CM

## 2016-07-31 DIAGNOSIS — D473 Essential (hemorrhagic) thrombocythemia: Secondary | ICD-10-CM | POA: Insufficient documentation

## 2016-07-31 DIAGNOSIS — D259 Leiomyoma of uterus, unspecified: Secondary | ICD-10-CM | POA: Diagnosis not present

## 2016-07-31 DIAGNOSIS — N920 Excessive and frequent menstruation with regular cycle: Secondary | ICD-10-CM | POA: Diagnosis not present

## 2016-07-31 MED ORDER — SODIUM CHLORIDE 0.9 % IV SOLN
510.0000 mg | Freq: Once | INTRAVENOUS | Status: AC
  Administered 2016-07-31: 510 mg via INTRAVENOUS
  Filled 2016-07-31: qty 17

## 2016-07-31 MED ORDER — SODIUM CHLORIDE 0.9 % IV SOLN
Freq: Once | INTRAVENOUS | Status: AC
  Administered 2016-07-31: 16:00:00 via INTRAVENOUS
  Filled 2016-07-31: qty 1000

## 2016-08-07 ENCOUNTER — Telehealth: Payer: Self-pay | Admitting: *Deleted

## 2016-08-07 NOTE — Telephone Encounter (Signed)
[facility] requested any imaging in reference to referral  be faxed to office.   Contact [phone] Fax [phone]

## 2016-08-08 NOTE — Telephone Encounter (Signed)
Only imaging was done in 2016, I didn't think they would want that. I will fax it.

## 2016-08-11 ENCOUNTER — Other Ambulatory Visit: Payer: Self-pay

## 2016-08-11 DIAGNOSIS — D508 Other iron deficiency anemias: Secondary | ICD-10-CM

## 2016-08-13 NOTE — Progress Notes (Signed)
[place]  [phone]) [phone] [phone]) [phone]  ID: Dana Dunlap OB: January 31, 1971  MR#: [id number]  JJH#:[id number]  Patient Care Team: Coral Spikes, DO as PCP - General (Family Medicine)  CHIEF COMPLAINT: Thrombocytosis, iron deficiency anemia.  INTERVAL HISTORY: Patient returns to clinic today for repeat laboratory work, further evaluation, and consideration of IV Feraheme. She continues to have heavy menses, but states her weakness and fatigue have improved. She has no neurologic complaints. She has no chest pain or shortness of breath. She denies any nausea, vomiting, constipation, or diarrhea. She has no melena or hematochezia. She has no urinary complaints. Patient offers no further specific complaints today.  REVIEW OF SYSTEMS:   Review of Systems  Constitutional: Positive for malaise/fatigue. Negative for fever and weight loss.  HENT: Negative.  Negative for congestion and sore throat.   Respiratory: Negative.  Negative for cough and shortness of breath.   Cardiovascular: Negative.  Negative for chest pain and leg swelling.  Gastrointestinal: Negative.  Negative for abdominal pain and blood in stool.  Genitourinary: Negative.   Musculoskeletal: Negative.   Skin: Negative.  Negative for rash.  Neurological: Positive for weakness.  Psychiatric/Behavioral: Negative for depression. The patient is not nervous/anxious.     As per HPI. Otherwise, a complete review of systems is negative.  PAST MEDICAL HISTORY: Past Medical History:  Diagnosis Date  . Depression    Currently on Cymbalta  . Fibroids   . Frequent headaches    Worse during monthly menstrual cycle  . Hypertension     PAST SURGICAL HISTORY: Past Surgical History:  Procedure Laterality Date  . BREAST MASS EXCISION     benign    FAMILY HISTORY: Family History  Problem Relation Age of Onset  . Hypertension Mother   . Hyperlipidemia Mother   . Cancer Father        Prostate cancer   . Hypertension Father   . Hyperlipidemia Father   . Stroke Maternal Aunt   . Stroke Paternal Aunt   . Diabetes Paternal Aunt     ADVANCED DIRECTIVES (Y/N):  N  HEALTH MAINTENANCE: Social History  Substance Use Topics  . Smoking status: Never Smoker  . Smokeless tobacco: Never Used  . Alcohol use Yes     Comment: occasional alcohol use     Colonoscopy:  PAP:  Bone density:  Lipid panel:  Allergies  Allergen Reactions  . Compazine [Prochlorperazine Edisylate] Other (See Comments)    Seizure     Current Outpatient Prescriptions  Medication Sig Dispense Refill  . clonazePAM (KLONOPIN) 0.5 MG tablet Take 1 tablet (0.5 mg total) by mouth 2 (two) times daily as needed for anxiety. 60 tablet 0  . DULoxetine (CYMBALTA) 30 MG capsule Take 1 capsule by mouth  daily , take with 60mg  for  total 90mg  daily 90 capsule 0  . DULoxetine (CYMBALTA) 60 MG capsule Take 1 capsule by mouth  daily , take with 30mg  for  total 90mg  daily 90 capsule 1  . ferrous sulfate (FEOSOL) 325 (65 FE) MG tablet Take 1 tablet (325 mg total) by mouth 2 (two) times daily with a meal. 60 tablet 3   No current facility-administered medications for this visit.     OBJECTIVE: Vitals:   08/14/16 1358  BP: 134/84  Pulse: 69  Resp: 18  Temp: 98 F (36.7 C)     Body mass index is 28.78 kg/m.    ECOG FS:0 - Asymptomatic  General: Well-developed, well-nourished, no acute distress.  Eyes: Pink conjunctiva, anicteric sclera. Lungs: Clear to auscultation bilaterally. Heart: Regular rate and rhythm. No rubs, murmurs, or gallops. Abdomen: Soft, nontender, nondistended. No organomegaly noted, normoactive bowel sounds. Musculoskeletal: No edema, cyanosis, or clubbing. Neuro: Alert, answering all questions appropriately. Cranial nerves grossly intact. Skin: No rashes or petechiae noted. Psych: Normal affect.   LAB RESULTS:  Lab Results  Component Value Date   NA 141 03/22/2016   K 3.9 03/22/2016   CL 111  03/22/2016   CO2 24 03/22/2016   GLUCOSE 106 (H) 03/22/2016   BUN 10 03/22/2016   CREATININE 0.80 03/22/2016   CALCIUM 9.1 03/22/2016   PROT 7.3 03/21/2016   ALBUMIN 4.1 03/21/2016   AST 11 03/21/2016   ALT 7 03/21/2016   ALKPHOS 44 03/21/2016   BILITOT 0.3 03/21/2016   GFRNONAA >60 03/22/2016   GFRAA >60 03/22/2016    Lab Results  Component Value Date   WBC 2.5 (L) 08/14/2016   NEUTROABS 1.3 (L) 08/14/2016   HGB 10.5 (L) 08/14/2016   HCT 32.8 (L) 08/14/2016   MCV 72.1 (L) 08/14/2016   PLT 318 08/14/2016   Lab Results  Component Value Date   IRON 15 (L) 06/26/2016   TIBC 281 06/26/2016   IRONPCTSAT 5 (L) 06/26/2016   Lab Results  Component Value Date   FERRITIN 15 06/26/2016     STUDIES: No results found.  ASSESSMENT: Thrombocytosis, iron deficiency anemia  PLAN:    1. Thrombocytosis: Resolved. Likely reactive from active bleeding and severe iron deficiency anemia. JAK-2 mutation is negative. No further intervention is needed.  2. Iron deficiency anemia: Secondary to heavy fibroid bleeding. Despite recommendations, patient has refused hysterectomy. She reports she is seeing her OB/GYN in [place]. Possibly may be able to do a procedure to remove the fibroid without a total hysterectomy. Patient's hemoglobin is significantly improved from previous, but still decreased. Her iron stores are pending at time of dictation. The remainder her laboratory work including hemoglobinopathy profile is either negative or within normal limits. Because patient is still symptomatic and still actively bleeding, will proceed with 510 mg IV Feraheme today. Patient will then return to clinic in 2 months with repeat laboratory work and further evaluation. 3. Fibroids: Continue follow-up per gynecology as above.  Patient expressed understanding and was in agreement with this plan. She also understands that She can call clinic at any time with any questions, concerns, or complaints.     Lloyd Huger, MD   08/14/2016 2:14 PM

## 2016-08-14 ENCOUNTER — Inpatient Hospital Stay: Payer: 59

## 2016-08-14 ENCOUNTER — Inpatient Hospital Stay: Payer: 59 | Attending: Oncology

## 2016-08-14 ENCOUNTER — Inpatient Hospital Stay (HOSPITAL_BASED_OUTPATIENT_CLINIC_OR_DEPARTMENT_OTHER): Payer: 59 | Admitting: Oncology

## 2016-08-14 VITALS — BP 134/84 | HR 69 | Temp 98.0°F | Resp 18 | Wt 152.3 lb

## 2016-08-14 VITALS — BP 125/70 | HR 72 | Resp 20

## 2016-08-14 DIAGNOSIS — R5381 Other malaise: Secondary | ICD-10-CM | POA: Insufficient documentation

## 2016-08-14 DIAGNOSIS — Z79899 Other long term (current) drug therapy: Secondary | ICD-10-CM | POA: Diagnosis not present

## 2016-08-14 DIAGNOSIS — D500 Iron deficiency anemia secondary to blood loss (chronic): Secondary | ICD-10-CM | POA: Diagnosis not present

## 2016-08-14 DIAGNOSIS — N920 Excessive and frequent menstruation with regular cycle: Secondary | ICD-10-CM

## 2016-08-14 DIAGNOSIS — R531 Weakness: Secondary | ICD-10-CM | POA: Diagnosis not present

## 2016-08-14 DIAGNOSIS — Z8042 Family history of malignant neoplasm of prostate: Secondary | ICD-10-CM | POA: Insufficient documentation

## 2016-08-14 DIAGNOSIS — D473 Essential (hemorrhagic) thrombocythemia: Secondary | ICD-10-CM

## 2016-08-14 DIAGNOSIS — R5383 Other fatigue: Secondary | ICD-10-CM | POA: Diagnosis not present

## 2016-08-14 DIAGNOSIS — D508 Other iron deficiency anemias: Secondary | ICD-10-CM

## 2016-08-14 DIAGNOSIS — D259 Leiomyoma of uterus, unspecified: Secondary | ICD-10-CM | POA: Diagnosis not present

## 2016-08-14 DIAGNOSIS — I10 Essential (primary) hypertension: Secondary | ICD-10-CM | POA: Diagnosis not present

## 2016-08-14 LAB — CBC WITH DIFFERENTIAL/PLATELET
BASOS ABS: 0 10*3/uL (ref 0–0.1)
BASOS PCT: 2 %
Eosinophils Absolute: 0.1 10*3/uL (ref 0–0.7)
Eosinophils Relative: 3 %
HEMATOCRIT: 32.8 % — AB (ref 35.0–47.0)
HEMOGLOBIN: 10.5 g/dL — AB (ref 12.0–16.0)
LYMPHS PCT: 38 %
Lymphs Abs: 0.9 10*3/uL — ABNORMAL LOW (ref 1.0–3.6)
MCH: 23.1 pg — ABNORMAL LOW (ref 26.0–34.0)
MCHC: 32 g/dL (ref 32.0–36.0)
MCV: 72.1 fL — AB (ref 80.0–100.0)
Monocytes Absolute: 0.2 10*3/uL (ref 0.2–0.9)
Monocytes Relative: 7 %
NEUTROS ABS: 1.3 10*3/uL — AB (ref 1.4–6.5)
NEUTROS PCT: 50 %
Platelets: 318 10*3/uL (ref 150–440)
RBC: 4.55 MIL/uL (ref 3.80–5.20)
RDW: 23.7 % — ABNORMAL HIGH (ref 11.5–14.5)
WBC: 2.5 10*3/uL — ABNORMAL LOW (ref 3.6–11.0)

## 2016-08-14 LAB — IRON AND TIBC
IRON: 25 ug/dL — AB (ref 28–170)
SATURATION RATIOS: 8 % — AB (ref 10.4–31.8)
TIBC: 299 ug/dL (ref 250–450)
UIBC: 274 ug/dL

## 2016-08-14 LAB — FERRITIN: Ferritin: 97 ng/mL (ref 11–307)

## 2016-08-14 MED ORDER — SODIUM CHLORIDE 0.9 % IV SOLN
INTRAVENOUS | Status: DC
  Administered 2016-08-14: 15:00:00 via INTRAVENOUS
  Filled 2016-08-14: qty 1000

## 2016-08-14 MED ORDER — SODIUM CHLORIDE 0.9 % IV SOLN
510.0000 mg | Freq: Once | INTRAVENOUS | Status: AC
  Administered 2016-08-14: 510 mg via INTRAVENOUS
  Filled 2016-08-14: qty 17

## 2016-08-14 NOTE — Progress Notes (Signed)
Offers no complaints  

## 2016-10-15 NOTE — Progress Notes (Signed)
[place]  [phone] [phone]) [phone]  ID: Dana Dunlap OB: 11/23/1970  MR#: [id number]  KZS#:[id number]  Patient Care Team: Coral Spikes, DO as PCP - General (Family Medicine)  CHIEF COMPLAINT: Thrombocytosis, iron deficiency anemia.  INTERVAL HISTORY: Patient returns to clinic today for repeat laboratory work, further evaluation, and consideration of IV Feraheme. She continues to have heavy menses, but states her weakness and fatigue have improved. She has a endometrial ablation scheduled in the near future. She has no neurologic complaints. She has no chest pain or shortness of breath. She denies any nausea, vomiting, constipation, or diarrhea. She has no melena or hematochezia. She has no urinary complaints. Patient offers no further specific complaints today.  REVIEW OF SYSTEMS:   Review of Systems  Constitutional: Positive for malaise/fatigue. Negative for fever and weight loss.  HENT: Negative.  Negative for congestion and sore throat.   Respiratory: Negative.  Negative for cough and shortness of breath.   Cardiovascular: Negative.  Negative for chest pain and leg swelling.  Gastrointestinal: Negative.  Negative for abdominal pain and blood in stool.  Genitourinary: Negative.   Musculoskeletal: Negative.   Skin: Negative.  Negative for rash.  Neurological: Positive for weakness.  Psychiatric/Behavioral: Negative for depression. The patient is not nervous/anxious.     As per HPI. Otherwise, a complete review of systems is negative.  PAST MEDICAL HISTORY: Past Medical History:  Diagnosis Date  . Depression    Currently on Cymbalta  . Fibroids   . Frequent headaches    Worse during monthly menstrual cycle  . Hypertension     PAST SURGICAL HISTORY: Past Surgical History:  Procedure Laterality Date  . BREAST MASS EXCISION     benign    FAMILY HISTORY: Family History  Problem Relation Age of Onset  . Hypertension Mother   .  Hyperlipidemia Mother   . Cancer Father        Prostate cancer  . Hypertension Father   . Hyperlipidemia Father   . Stroke Maternal Aunt   . Stroke Paternal Aunt   . Diabetes Paternal Aunt     ADVANCED DIRECTIVES (Y/N):  N  HEALTH MAINTENANCE: Social History  Substance Use Topics  . Smoking status: Never Smoker  . Smokeless tobacco: Never Used  . Alcohol use Yes     Comment: occasional alcohol use     Colonoscopy:  PAP:  Bone density:  Lipid panel:  Allergies  Allergen Reactions  . Compazine [Prochlorperazine Edisylate] Other (See Comments)    Seizure     Current Outpatient Prescriptions  Medication Sig Dispense Refill  . clonazePAM (KLONOPIN) 0.5 MG tablet Take 1 tablet (0.5 mg total) by mouth 2 (two) times daily as needed for anxiety. 60 tablet 0  . DULoxetine (CYMBALTA) 30 MG capsule Take 1 capsule by mouth  daily , take with 60mg  for  total 90mg  daily 90 capsule 0  . DULoxetine (CYMBALTA) 60 MG capsule Take 1 capsule by mouth  daily , take with 30mg  for  total 90mg  daily 90 capsule 1  . ferrous sulfate (FEOSOL) 325 (65 FE) MG tablet Take 1 tablet (325 mg total) by mouth 2 (two) times daily with a meal. 60 tablet 3   No current facility-administered medications for this visit.     OBJECTIVE: Vitals:   10/16/16 1122  BP: 129/80  Pulse: 67  Resp: 18  Temp: (!) 97.5 F (36.4 C)     Body mass index is 28.49 kg/m.  ECOG FS:0 - Asymptomatic  General: Well-developed, well-nourished, no acute distress. Eyes: Pink conjunctiva, anicteric sclera. Lungs: Clear to auscultation bilaterally. Heart: Regular rate and rhythm. No rubs, murmurs, or gallops. Abdomen: Soft, nontender, nondistended. No organomegaly noted, normoactive bowel sounds. Musculoskeletal: No edema, cyanosis, or clubbing. Neuro: Alert, answering all questions appropriately. Cranial nerves grossly intact. Skin: No rashes or petechiae noted. Psych: Normal affect.   LAB RESULTS:  Lab Results    Component Value Date   NA 141 03/22/2016   K 3.9 03/22/2016   CL 111 03/22/2016   CO2 24 03/22/2016   GLUCOSE 106 (H) 03/22/2016   BUN 10 03/22/2016   CREATININE 0.80 03/22/2016   CALCIUM 9.1 03/22/2016   PROT 7.3 03/21/2016   ALBUMIN 4.1 03/21/2016   AST 11 03/21/2016   ALT 7 03/21/2016   ALKPHOS 44 03/21/2016   BILITOT 0.3 03/21/2016   GFRNONAA >60 03/22/2016   GFRAA >60 03/22/2016    Lab Results  Component Value Date   WBC 2.9 (L) 10/16/2016   NEUTROABS 1.7 10/16/2016   HGB 10.0 (L) 10/16/2016   HCT 31.1 (L) 10/16/2016   MCV 71.4 (L) 10/16/2016   PLT 374 10/16/2016   Lab Results  Component Value Date   IRON 17 (L) 10/16/2016   TIBC 297 10/16/2016   IRONPCTSAT 6 (L) 10/16/2016   Lab Results  Component Value Date   FERRITIN 20 10/16/2016     STUDIES: No results found.  ASSESSMENT: Thrombocytosis, iron deficiency anemia  PLAN:    1. Thrombocytosis: Resolved. Likely reactive from active bleeding and severe iron deficiency anemia. JAK-2 mutation is negative. No further intervention is needed.  2. Iron deficiency anemia: Secondary to heavy fibroid bleeding. Despite recommendations, patient has refused hysterectomy, but now appears to be scheduled for an endometrial ablation. She reports she is seeing her OB/GYN in [place]. Patient's hemoglobin is significantly improved from previous, but still decreased. Her iron stores also continue to be decreased. The remainder her laboratory work including hemoglobinopathy profile is either negative or within normal limits. Because patient is still symptomatic and still actively bleeding, will proceed with 510 mg IV Feraheme today. Patient will then return to clinic in 2 months with repeat laboratory work and further evaluation. 3. Fibroids: Continue follow-up per gynecology as above.  Patient expressed understanding and was in agreement with this plan. She also understands that She can call clinic at any time with any  questions, concerns, or complaints.    Lloyd Huger, MD   10/20/2016 8:42 AM

## 2016-10-16 ENCOUNTER — Inpatient Hospital Stay: Payer: 59

## 2016-10-16 ENCOUNTER — Inpatient Hospital Stay: Payer: 59 | Attending: Oncology | Admitting: Oncology

## 2016-10-16 VITALS — BP 129/80 | HR 67 | Temp 97.5°F | Resp 18 | Wt 150.8 lb

## 2016-10-16 DIAGNOSIS — Z79899 Other long term (current) drug therapy: Secondary | ICD-10-CM | POA: Diagnosis not present

## 2016-10-16 DIAGNOSIS — D500 Iron deficiency anemia secondary to blood loss (chronic): Secondary | ICD-10-CM | POA: Diagnosis not present

## 2016-10-16 DIAGNOSIS — D508 Other iron deficiency anemias: Secondary | ICD-10-CM

## 2016-10-16 DIAGNOSIS — R5383 Other fatigue: Secondary | ICD-10-CM | POA: Insufficient documentation

## 2016-10-16 DIAGNOSIS — I10 Essential (primary) hypertension: Secondary | ICD-10-CM | POA: Insufficient documentation

## 2016-10-16 DIAGNOSIS — R5381 Other malaise: Secondary | ICD-10-CM | POA: Insufficient documentation

## 2016-10-16 DIAGNOSIS — D473 Essential (hemorrhagic) thrombocythemia: Secondary | ICD-10-CM | POA: Insufficient documentation

## 2016-10-16 DIAGNOSIS — N920 Excessive and frequent menstruation with regular cycle: Secondary | ICD-10-CM | POA: Insufficient documentation

## 2016-10-16 DIAGNOSIS — D259 Leiomyoma of uterus, unspecified: Secondary | ICD-10-CM | POA: Insufficient documentation

## 2016-10-16 DIAGNOSIS — F329 Major depressive disorder, single episode, unspecified: Secondary | ICD-10-CM | POA: Diagnosis not present

## 2016-10-16 DIAGNOSIS — R531 Weakness: Secondary | ICD-10-CM | POA: Insufficient documentation

## 2016-10-16 LAB — CBC WITH DIFFERENTIAL/PLATELET
Basophils Absolute: 0 10*3/uL (ref 0–0.1)
Basophils Relative: 1 %
EOS ABS: 0.1 10*3/uL (ref 0–0.7)
EOS PCT: 3 %
HCT: 31.1 % — ABNORMAL LOW (ref 35.0–47.0)
Hemoglobin: 10 g/dL — ABNORMAL LOW (ref 12.0–16.0)
Lymphocytes Relative: 29 %
Lymphs Abs: 0.8 10*3/uL — ABNORMAL LOW (ref 1.0–3.6)
MCH: 22.9 pg — AB (ref 26.0–34.0)
MCHC: 32.1 g/dL (ref 32.0–36.0)
MCV: 71.4 fL — ABNORMAL LOW (ref 80.0–100.0)
MONO ABS: 0.2 10*3/uL (ref 0.2–0.9)
Monocytes Relative: 8 %
Neutro Abs: 1.7 10*3/uL (ref 1.4–6.5)
Neutrophils Relative %: 59 %
PLATELETS: 374 10*3/uL (ref 150–440)
RBC: 4.35 MIL/uL (ref 3.80–5.20)
RDW: 20 % — AB (ref 11.5–14.5)
WBC: 2.9 10*3/uL — AB (ref 3.6–11.0)

## 2016-10-16 LAB — IRON AND TIBC
IRON: 17 ug/dL — AB (ref 28–170)
Saturation Ratios: 6 % — ABNORMAL LOW (ref 10.4–31.8)
TIBC: 297 ug/dL (ref 250–450)
UIBC: 280 ug/dL

## 2016-10-16 LAB — FERRITIN: Ferritin: 20 ng/mL (ref 11–307)

## 2016-10-16 MED ORDER — CYANOCOBALAMIN 1000 MCG/ML IJ SOLN
INTRAMUSCULAR | Status: AC
Start: 2016-10-16 — End: 2016-10-16
  Filled 2016-10-16: qty 1

## 2016-10-16 MED ORDER — SODIUM CHLORIDE 0.9 % IV SOLN
510.0000 mg | Freq: Once | INTRAVENOUS | Status: AC
  Administered 2016-10-16: 510 mg via INTRAVENOUS
  Filled 2016-10-16: qty 17

## 2016-10-16 MED ORDER — SODIUM CHLORIDE 0.9 % IV SOLN
INTRAVENOUS | Status: DC
  Administered 2016-10-16: 12:00:00 via INTRAVENOUS
  Filled 2016-10-16: qty 1000

## 2016-10-16 NOTE — Progress Notes (Signed)
Patient is here for follow up, she is doing ok. Dr. Lynnette Caffey is prepping patient for surgery. Have you been in touch with this provider.

## 2016-12-16 NOTE — Progress Notes (Signed)
[place]  [phone] Fax:(336) [phone]  ID: Dana Dunlap OB: Jan 07, 1971  MR#: [id number]  ATF#:[id number]  Patient Care Team: Leone Haven, MD as PCP - General (Family Medicine)  CHIEF COMPLAINT: Thrombocytosis, iron deficiency anemia.  INTERVAL HISTORY: Patient returns to clinic today for repeat laboratory work, further evaluation, and consideration of IV Feraheme. She continues to have heavy menses, but states her weakness and fatigue have improved. She was scheduled to have an endometrial ablation, but this procedure was canceled for unknown reasons. She has no neurologic complaints. She has no chest pain or shortness of breath. She denies any nausea, vomiting, constipation, or diarrhea. She has no melena or hematochezia. She has no urinary complaints. Patient offers no further specific complaints today.  REVIEW OF SYSTEMS:   Review of Systems  Constitutional: Positive for malaise/fatigue. Negative for fever and weight loss.  HENT: Negative.  Negative for congestion and sore throat.   Respiratory: Negative.  Negative for cough and shortness of breath.   Cardiovascular: Negative.  Negative for chest pain and leg swelling.  Gastrointestinal: Negative.  Negative for abdominal pain and blood in stool.  Genitourinary: Negative.   Musculoskeletal: Negative.   Skin: Negative.  Negative for rash.  Neurological: Positive for weakness.  Psychiatric/Behavioral: Negative for depression. The patient is not nervous/anxious.     As per HPI. Otherwise, a complete review of systems is negative.  PAST MEDICAL HISTORY: Past Medical History:  Diagnosis Date  . Depression    Currently on Cymbalta  . Fibroids   . Frequent headaches    Worse during monthly menstrual cycle  . Hypertension     PAST SURGICAL HISTORY: Past Surgical History:  Procedure Laterality Date  . BREAST MASS EXCISION     benign    FAMILY HISTORY: Family History  Problem  Relation Age of Onset  . Hypertension Mother   . Hyperlipidemia Mother   . Cancer Father        Prostate cancer  . Hypertension Father   . Hyperlipidemia Father   . Stroke Maternal Aunt   . Stroke Paternal Aunt   . Diabetes Paternal Aunt     ADVANCED DIRECTIVES (Y/N):  N  HEALTH MAINTENANCE: Social History  Substance Use Topics  . Smoking status: Never Smoker  . Smokeless tobacco: Never Used  . Alcohol use Yes     Comment: occasional alcohol use     Colonoscopy:  PAP:  Bone density:  Lipid panel:  Allergies  Allergen Reactions  . Compazine [Prochlorperazine Edisylate] Other (See Comments)    Seizure     Current Outpatient Prescriptions  Medication Sig Dispense Refill  . clonazePAM (KLONOPIN) 0.5 MG tablet Take 1 tablet (0.5 mg total) by mouth 2 (two) times daily as needed for anxiety. 60 tablet 0  . DULoxetine (CYMBALTA) 30 MG capsule Take 1 capsule by mouth  daily , take with 60mg  for  total 90mg  daily 90 capsule 0  . DULoxetine (CYMBALTA) 60 MG capsule Take 1 capsule by mouth  daily , take with 30mg  for  total 90mg  daily 90 capsule 1  . ferrous sulfate (FEOSOL) 325 (65 FE) MG tablet Take 1 tablet (325 mg total) by mouth 2 (two) times daily with a meal. 60 tablet 3   No current facility-administered medications for this visit.     OBJECTIVE: Vitals:   12/18/16 1354  BP: 133/82  Pulse: 67  Resp: 20  Temp: (!) 96.2 F (35.7 C)     Body mass index  is 29.17 kg/m.    ECOG FS:0 - Asymptomatic  General: Well-developed, well-nourished, no acute distress. Eyes: Pink conjunctiva, anicteric sclera. Lungs: Clear to auscultation bilaterally. Heart: Regular rate and rhythm. No rubs, murmurs, or gallops. Abdomen: Soft, nontender, nondistended. No organomegaly noted, normoactive bowel sounds. Musculoskeletal: No edema, cyanosis, or clubbing. Neuro: Alert, answering all questions appropriately. Cranial nerves grossly intact. Skin: No rashes or petechiae noted. Psych:  Normal affect.   LAB RESULTS:  Lab Results  Component Value Date   NA 141 03/22/2016   K 3.9 03/22/2016   CL 111 03/22/2016   CO2 24 03/22/2016   GLUCOSE 106 (H) 03/22/2016   BUN 10 03/22/2016   CREATININE 0.80 03/22/2016   CALCIUM 9.1 03/22/2016   PROT 7.3 03/21/2016   ALBUMIN 4.1 03/21/2016   AST 11 03/21/2016   ALT 7 03/21/2016   ALKPHOS 44 03/21/2016   BILITOT 0.3 03/21/2016   GFRNONAA >60 03/22/2016   GFRAA >60 03/22/2016    Lab Results  Component Value Date   WBC 3.6 12/18/2016   NEUTROABS 1.9 12/18/2016   HGB 8.4 (L) 12/18/2016   HCT 25.6 (L) 12/18/2016   MCV 70.0 (L) 12/18/2016   PLT 499 (H) 12/18/2016   Lab Results  Component Value Date   IRON 11 (L) 12/18/2016   TIBC 298 12/18/2016   IRONPCTSAT 4 (L) 12/18/2016   Lab Results  Component Value Date   FERRITIN 9 (L) 12/18/2016     STUDIES: No results found.  ASSESSMENT: Thrombocytosis, iron deficiency anemia  PLAN:    1. Thrombocytosis: Mild. Likely reactive from active bleeding and severe iron deficiency anemia. JAK-2 mutation is negative. No further intervention is needed.  2. Iron deficiency anemia: Secondary to heavy fibroid bleeding. Despite recommendations, patient has refused hysterectomy. She was scheduled to have an endometrial ablation, but this procedure was canceled for unknown reasons. She continues to follow-up with her OB/GYN in [place]. Patient's hemoglobin and iron stores have trended down, therefore will proceed with 510 mg IV Feraheme today. Return to clinic in 1 week for a second infusion. Previously, the remainder her laboratory work including hemoglobinopathy profile was either negative or within normal limits. Patient will then return to clinic in 2 months with repeat laboratory work and further evaluation. 3. Fibroids: Continue follow-up per gynecology as above.  Patient expressed understanding and was in agreement with this plan. She also understands that She can call clinic  at any time with any questions, concerns, or complaints.    Lloyd Huger, MD   12/18/2016 3:55 PM

## 2016-12-18 ENCOUNTER — Inpatient Hospital Stay: Payer: 59

## 2016-12-18 ENCOUNTER — Inpatient Hospital Stay: Payer: 59 | Attending: Oncology | Admitting: Oncology

## 2016-12-18 VITALS — BP 133/82 | HR 67 | Temp 96.2°F | Resp 20 | Wt 154.4 lb

## 2016-12-18 DIAGNOSIS — D500 Iron deficiency anemia secondary to blood loss (chronic): Secondary | ICD-10-CM | POA: Insufficient documentation

## 2016-12-18 DIAGNOSIS — D259 Leiomyoma of uterus, unspecified: Secondary | ICD-10-CM | POA: Insufficient documentation

## 2016-12-18 DIAGNOSIS — Z79899 Other long term (current) drug therapy: Secondary | ICD-10-CM | POA: Diagnosis not present

## 2016-12-18 DIAGNOSIS — R5383 Other fatigue: Secondary | ICD-10-CM | POA: Insufficient documentation

## 2016-12-18 DIAGNOSIS — D473 Essential (hemorrhagic) thrombocythemia: Secondary | ICD-10-CM | POA: Insufficient documentation

## 2016-12-18 DIAGNOSIS — Z8042 Family history of malignant neoplasm of prostate: Secondary | ICD-10-CM | POA: Insufficient documentation

## 2016-12-18 DIAGNOSIS — D508 Other iron deficiency anemias: Secondary | ICD-10-CM

## 2016-12-18 DIAGNOSIS — R531 Weakness: Secondary | ICD-10-CM | POA: Insufficient documentation

## 2016-12-18 DIAGNOSIS — R5381 Other malaise: Secondary | ICD-10-CM | POA: Diagnosis not present

## 2016-12-18 DIAGNOSIS — N920 Excessive and frequent menstruation with regular cycle: Secondary | ICD-10-CM | POA: Insufficient documentation

## 2016-12-18 DIAGNOSIS — I10 Essential (primary) hypertension: Secondary | ICD-10-CM | POA: Diagnosis not present

## 2016-12-18 LAB — CBC WITH DIFFERENTIAL/PLATELET
BASOS ABS: 0 10*3/uL (ref 0–0.1)
Basophils Relative: 1 %
Eosinophils Absolute: 0.1 10*3/uL (ref 0–0.7)
Eosinophils Relative: 2 %
HEMATOCRIT: 25.6 % — AB (ref 35.0–47.0)
Hemoglobin: 8.4 g/dL — ABNORMAL LOW (ref 12.0–16.0)
LYMPHS ABS: 1.2 10*3/uL (ref 1.0–3.6)
LYMPHS PCT: 33 %
MCH: 22.8 pg — ABNORMAL LOW (ref 26.0–34.0)
MCHC: 32.6 g/dL (ref 32.0–36.0)
MCV: 70 fL — AB (ref 80.0–100.0)
MONO ABS: 0.4 10*3/uL (ref 0.2–0.9)
Monocytes Relative: 10 %
NEUTROS ABS: 1.9 10*3/uL (ref 1.4–6.5)
Neutrophils Relative %: 54 %
Platelets: 499 10*3/uL — ABNORMAL HIGH (ref 150–440)
RBC: 3.66 MIL/uL — AB (ref 3.80–5.20)
RDW: 18.7 % — ABNORMAL HIGH (ref 11.5–14.5)
WBC: 3.6 10*3/uL (ref 3.6–11.0)

## 2016-12-18 LAB — FERRITIN: Ferritin: 9 ng/mL — ABNORMAL LOW (ref 11–307)

## 2016-12-18 LAB — IRON AND TIBC
Iron: 11 ug/dL — ABNORMAL LOW (ref 28–170)
SATURATION RATIOS: 4 % — AB (ref 10.4–31.8)
TIBC: 298 ug/dL (ref 250–450)
UIBC: 287 ug/dL

## 2016-12-18 MED ORDER — SODIUM CHLORIDE 0.9 % IV SOLN
510.0000 mg | Freq: Once | INTRAVENOUS | Status: AC
  Administered 2016-12-18: 510 mg via INTRAVENOUS
  Filled 2016-12-18: qty 17

## 2016-12-18 MED ORDER — SODIUM CHLORIDE 0.9 % IV SOLN
Freq: Once | INTRAVENOUS | Status: AC
  Administered 2016-12-18: 15:00:00 via INTRAVENOUS
  Filled 2016-12-18: qty 1000

## 2016-12-18 NOTE — Progress Notes (Signed)
Patient here today for follow up regarding anemia. Patient reports she continues to have heavy bleeding, LMP 9/11. Patient reports her surgery has been cancelled at this time, is awaiting communication from OB/GYN office regarding plan for surgery.

## 2016-12-25 ENCOUNTER — Inpatient Hospital Stay: Payer: 59

## 2016-12-25 VITALS — BP 126/79 | HR 56 | Temp 97.7°F | Resp 16

## 2016-12-25 DIAGNOSIS — D508 Other iron deficiency anemias: Secondary | ICD-10-CM

## 2016-12-25 DIAGNOSIS — D500 Iron deficiency anemia secondary to blood loss (chronic): Secondary | ICD-10-CM | POA: Diagnosis not present

## 2016-12-25 MED ORDER — SODIUM CHLORIDE 0.9 % IV SOLN
510.0000 mg | Freq: Once | INTRAVENOUS | Status: AC
  Administered 2016-12-25: 510 mg via INTRAVENOUS
  Filled 2016-12-25: qty 17

## 2016-12-25 MED ORDER — SODIUM CHLORIDE 0.9 % IV SOLN
Freq: Once | INTRAVENOUS | Status: AC
  Administered 2016-12-25: 14:00:00 via INTRAVENOUS
  Filled 2016-12-25: qty 1000

## 2017-01-01 ENCOUNTER — Ambulatory Visit: Payer: 59 | Admitting: Family Medicine

## 2017-01-08 ENCOUNTER — Ambulatory Visit (INDEPENDENT_AMBULATORY_CARE_PROVIDER_SITE_OTHER): Payer: 59 | Admitting: Family Medicine

## 2017-01-08 ENCOUNTER — Ambulatory Visit (INDEPENDENT_AMBULATORY_CARE_PROVIDER_SITE_OTHER): Payer: 59

## 2017-01-08 ENCOUNTER — Encounter: Payer: Self-pay | Admitting: Family Medicine

## 2017-01-08 VITALS — BP 140/76 | HR 67 | Temp 98.1°F | Wt 153.6 lb

## 2017-01-08 DIAGNOSIS — D259 Leiomyoma of uterus, unspecified: Secondary | ICD-10-CM | POA: Diagnosis not present

## 2017-01-08 DIAGNOSIS — D500 Iron deficiency anemia secondary to blood loss (chronic): Secondary | ICD-10-CM

## 2017-01-08 DIAGNOSIS — M25561 Pain in right knee: Secondary | ICD-10-CM

## 2017-01-08 DIAGNOSIS — F419 Anxiety disorder, unspecified: Secondary | ICD-10-CM

## 2017-01-08 DIAGNOSIS — G8929 Other chronic pain: Secondary | ICD-10-CM

## 2017-01-08 MED ORDER — DULOXETINE HCL 60 MG PO CPEP: ORAL_CAPSULE | ORAL | 1 refills | Status: DC

## 2017-01-08 MED ORDER — CLONAZEPAM 0.5 MG PO TABS: 0.5000 mg | ORAL_TABLET | Freq: Two times a day (BID) | ORAL | 0 refills | Status: DC | PRN

## 2017-01-08 MED ORDER — DULOXETINE HCL 30 MG PO CPEP: ORAL_CAPSULE | ORAL | 1 refills | Status: DC

## 2017-01-08 NOTE — Progress Notes (Signed)
Tommi Rumps, MD Phone: [phone]  Dana Dunlap is a 46 y.o. female who presents today for follow-up.  Anemia: Related to heavy menstrual cycles due to fibroids. She has been recommended to have a hysterectomy multiple times by multiple gynecologists though she declines. She feels uncomfortable having organs removed from her body. They tried to refer her to a center in [place] for myomectomy though they reported she's not a candidate. She is very frustrated as no one seems to be listening to her. She notes she's been getting iron infusions which have been helpful. Notes her fatigue and dyspnea have improved since her most recent set of iron infusions. No chest pain. She has one menstrual cycle monthly last about 10 days.  Anxiety: This is related mostly to the bleeding. She does have some depression and hopelessness. She's been crying lots. She wonders what's wrong with her and her family as her siblings have similar issues. She wonders if they are dying as a group. She is on Cymbalta and Klonopin. No SI or HI.  Right knee pain: This is been going on for several weeks though longer chronically. Hurts with walking. No injury. She's not done anything for this.  PMH: nonsmoker.   ROS see history of present illness  Objective  Physical Exam Vitals:   01/08/17 1024  BP: 140/76  Pulse: 67  Temp: 98.1 F (36.7 C)  SpO2: 100%    BP Readings from Last 3 Encounters:  01/08/17 140/76  12/25/16 126/79  12/18/16 133/82   Wt Readings from Last 3 Encounters:  01/08/17 153 lb 9.6 oz (69.7 kg)  12/18/16 154 lb 6.4 oz (70 kg)  10/16/16 150 lb 12.8 oz (68.4 kg)    Physical Exam  Constitutional: No distress.  Cardiovascular: Normal rate, regular rhythm and normal heart sounds.   Pulmonary/Chest: Effort normal and breath sounds normal.  Musculoskeletal: She exhibits no edema.  Bilateral knees with no warmth, swelling, erythema, or tenderness, no ligamentous laxity, negative  McMurray's  Neurological: She is alert. Gait normal.  Skin: Skin is warm and dry. She is not diaphoretic.  Psychiatric:  Mood anxious and depressed, affect flat, intermittently tearful     Assessment/Plan: Please see individual problem list.  Fibroid, uterine Discussed with the fibroids are the cause of her issue. Discussed that she needs some type of intervention for this. She has not been happy with the answer she has gotten so far. We will refer to gynecology at [facility] for a second opinion.  Anemia, iron deficiency Currently undergoing iron infusions. Feels quite a bit better since getting her most recent round of infusions. She'll follow with hematology for this and for lab work. Discussed return precautions.  Anxiety Patient with significant anxiety now with some depression. Currently on Cymbalta and Klonopin. Try to provide reassurance. Discussed seeing a psychologist.  Right knee pain Benign exam. X-ray today. Consider referral to orthopedics or sports medicine.   Orders Placed This Encounter  Procedures  . DG Knee Complete 4 Views Right    Standing Status:   Future    Number of Occurrences:   1    Standing Expiration Date:   03/10/2018    Order Specific Question:   Reason for Exam (SYMPTOM  OR DIAGNOSIS REQUIRED)    Answer:   right knee pain    Order Specific Question:   Is patient pregnant?    Answer:   No    Order Specific Question:   Preferred imaging location?    Answer:  [facility] Specific Question:   Radiology Contrast Protocol - do NOT remove file path    Answer:   [email or web address]  . Ambulatory referral to Gynecology    Referral Priority:   Routine    Referral Type:   Consultation    Referral Reason:   Specialty Services Required    Requested Specialty:   Gynecology    Number of Visits Requested:   1    Meds ordered this encounter  Medications  . clonazePAM (KLONOPIN) 0.5 MG tablet     Sig: Take 1 tablet (0.5 mg total) by mouth 2 (two) times daily as needed for anxiety.    Dispense:  60 tablet    Refill:  0  . DULoxetine (CYMBALTA) 30 MG capsule    Sig: Take 1 capsule by mouth  daily , take with 60mg  for  total 90mg  daily    Dispense:  90 capsule    Refill:  1  . DULoxetine (CYMBALTA) 60 MG capsule    Sig: Take 1 capsule by mouth  daily , take with 30mg  for  total 90mg  daily    Dispense:  90 capsule    Refill:  [place], MD [place]

## 2017-01-08 NOTE — Patient Instructions (Signed)
Nice to see you. We'll try to get you set up with a new gynecologist. Please contact one of the therapists as listed. If you develop worsening fatigue, shortness of breath, or you about chest pain, or excessive bleeding please be evaluated immediately.

## 2017-01-09 ENCOUNTER — Other Ambulatory Visit: Payer: Self-pay | Admitting: Family Medicine

## 2017-01-09 DIAGNOSIS — M25561 Pain in right knee: Secondary | ICD-10-CM

## 2017-01-09 NOTE — Assessment & Plan Note (Signed)
Currently undergoing iron infusions. Feels quite a bit better since getting her most recent round of infusions. She'll follow with hematology for this and for lab work. Discussed return precautions.

## 2017-01-09 NOTE — Assessment & Plan Note (Signed)
Benign exam. X-ray today. Consider referral to orthopedics or sports medicine.

## 2017-01-09 NOTE — Assessment & Plan Note (Signed)
Discussed with the fibroids are the cause of her issue. Discussed that she needs some type of intervention for this. She has not been happy with the answer she has gotten so far. We will refer to gynecology at [facility] for a second opinion.

## 2017-01-09 NOTE — Assessment & Plan Note (Signed)
Patient with significant anxiety now with some depression. Currently on Cymbalta and Klonopin. Try to provide reassurance. Discussed seeing a psychologist.

## 2017-01-10 NOTE — Progress Notes (Signed)
Tried calling, no vm 

## 2017-01-11 NOTE — Progress Notes (Signed)
Mailed list to patient per patient request

## 2017-01-30 DIAGNOSIS — M222X1 Patellofemoral disorders, right knee: Secondary | ICD-10-CM | POA: Diagnosis not present

## 2017-02-08 DIAGNOSIS — M222X1 Patellofemoral disorders, right knee: Secondary | ICD-10-CM | POA: Diagnosis not present

## 2017-02-12 ENCOUNTER — Ambulatory Visit: Payer: Self-pay | Admitting: *Deleted

## 2017-02-12 NOTE — Telephone Encounter (Signed)
Yes, Only her PCP or her therapist can  Do the FMLA  . Since Dr [name] is out of town for 2 .5 weeks, she will have to ask her therapist to do it

## 2017-02-12 NOTE — Telephone Encounter (Signed)
Patient called explaining numerous medical conditions that she needs additional time off from work to deal with. She currently has FMLA with 1 yr of Health Accommodations and FMLA intermittent.  She is asking for a 2-3 week of continueous time of FMLA  note from Dr. [name] in order to make this happen.

## 2017-02-12 NOTE — Telephone Encounter (Signed)
Called and spoke with patient she is having swelling and pain still in right knee, but she really feels she is greatly overwhelmed with her sister in the hospital and her appointments to ortho and GYN patient breaking down on phone crying, Patient stating I am just so Overwhelmed by it all I cannot function. Patient was referred to Dr. Nelva Bush from Dr. [name] for therapy and some one to help with this issue, since patient is wanting FMLA for emotional status and anxiety and PCP out of office recommended patient should call her therapist and advised her how she is feeling, patient agreed and stated she would contact therapist and if she couldn't reach therapist would return call  to office. Her therapist should be the one to write FMLA for emotional and anxiety issues?

## 2017-02-16 ENCOUNTER — Other Ambulatory Visit: Payer: Self-pay | Admitting: *Deleted

## 2017-02-16 DIAGNOSIS — D509 Iron deficiency anemia, unspecified: Secondary | ICD-10-CM

## 2017-02-16 DIAGNOSIS — M25561 Pain in right knee: Secondary | ICD-10-CM | POA: Diagnosis not present

## 2017-02-16 DIAGNOSIS — M222X1 Patellofemoral disorders, right knee: Secondary | ICD-10-CM | POA: Diagnosis not present

## 2017-02-16 DIAGNOSIS — G8929 Other chronic pain: Secondary | ICD-10-CM | POA: Diagnosis not present

## 2017-02-17 NOTE — Progress Notes (Signed)
[place]  [phone] [phone]  ID: Dana Dunlap OB: [birth year]  MR#: [id number]  IRW#:[id number]  Patient Care Team: Leone Haven, MD as PCP - General (Family Medicine)  CHIEF COMPLAINT: Thrombocytosis, iron deficiency anemia.  INTERVAL HISTORY: Patient returns to clinic today for repeat laboratory work, further evaluation, and consideration of IV Feraheme. She continues to have heavy menses and now states she has an appointment at [facility] for evaluation.  She continues to have persistent weakness and fatigue.  She now has persistent right knee pain that is being evaluated by orthopedics. She has no neurologic complaints. She has no chest pain or shortness of breath. She denies any nausea, vomiting, constipation, or diarrhea. She has no melena or hematochezia. She has no urinary complaints. Patient offers no further specific complaints today.  REVIEW OF SYSTEMS:   Review of Systems  Constitutional: Positive for malaise/fatigue. Negative for fever and weight loss.  HENT: Negative.  Negative for congestion and sore throat.   Respiratory: Negative.  Negative for cough and shortness of breath.   Cardiovascular: Negative.  Negative for chest pain and leg swelling.  Gastrointestinal: Negative.  Negative for abdominal pain and blood in stool.  Genitourinary: Negative.   Musculoskeletal: Positive for joint pain.  Skin: Negative.  Negative for rash.  Neurological: Positive for weakness.  Psychiatric/Behavioral: Negative for depression. The patient is not nervous/anxious.     As per HPI. Otherwise, a complete review of systems is negative.  PAST MEDICAL HISTORY: Past Medical History:  Diagnosis Date  . Depression    Currently on Cymbalta  . Fibroids   . Frequent headaches    Worse during monthly menstrual cycle  . Hypertension     PAST SURGICAL HISTORY: Past Surgical History:  Procedure Laterality Date  . BREAST MASS EXCISION      benign    FAMILY HISTORY: Family History  Problem Relation Age of Onset  . Hypertension Mother   . Hyperlipidemia Mother   . Cancer Father        Prostate cancer  . Hypertension Father   . Hyperlipidemia Father   . Stroke Maternal Aunt   . Stroke Paternal Aunt   . Diabetes Paternal Aunt     ADVANCED DIRECTIVES (Y/N):  N  HEALTH MAINTENANCE: Social History   Tobacco Use  . Smoking status: Never Smoker  . Smokeless tobacco: Never Used  Substance Use Topics  . Alcohol use: Yes    Comment: occasional alcohol use  . Drug use: No     Colonoscopy:  PAP:  Bone density:  Lipid panel:  Allergies  Allergen Reactions  . Compazine [Prochlorperazine Edisylate] Other (See Comments)    Seizure     Current Outpatient Medications  Medication Sig Dispense Refill  . clonazePAM (KLONOPIN) 0.5 MG tablet Take 1 tablet (0.5 mg total) by mouth 2 (two) times daily as needed for anxiety. 60 tablet 0  . DULoxetine (CYMBALTA) 30 MG capsule Take 1 capsule by mouth  daily , take with 60mg  for  total 90mg  daily 90 capsule 1  . DULoxetine (CYMBALTA) 60 MG capsule Take 1 capsule by mouth  daily , take with 30mg  for  total 90mg  daily 90 capsule 1  . ferrous sulfate (FEOSOL) 325 (65 FE) MG tablet Take 1 tablet (325 mg total) by mouth 2 (two) times daily with a meal. 60 tablet 3   No current facility-administered medications for this visit.    Facility-Administered Medications Ordered in Other Visits  Medication Dose  Route Frequency Provider Last Rate Last Dose  . 0.9 %  sodium chloride infusion   Intravenous Continuous Lloyd Huger, MD   Stopped at 02/19/17 1555    OBJECTIVE: Vitals:   02/19/17 1403  BP: (!) 168/83  Pulse: 76  Temp: 98.2 F (36.8 C)     Body mass index is 29.15 kg/m.    ECOG FS:0 - Asymptomatic  General: Well-developed, well-nourished, no acute distress. Eyes: Pink conjunctiva, anicteric sclera. Lungs: Clear to auscultation bilaterally. Heart: Regular  rate and rhythm. No rubs, murmurs, or gallops. Abdomen: Soft, nontender, nondistended. No organomegaly noted, normoactive bowel sounds. Musculoskeletal: No edema, cyanosis, or clubbing. Neuro: Alert, answering all questions appropriately. Cranial nerves grossly intact. Skin: No rashes or petechiae noted. Psych: Normal affect.   LAB RESULTS:  Lab Results  Component Value Date   NA 141 03/22/2016   K 3.9 03/22/2016   CL 111 03/22/2016   CO2 24 03/22/2016   GLUCOSE 106 (H) 03/22/2016   BUN 10 03/22/2016   CREATININE 0.80 03/22/2016   CALCIUM 9.1 03/22/2016   PROT 7.3 03/21/2016   ALBUMIN 4.1 03/21/2016   AST 11 03/21/2016   ALT 7 03/21/2016   ALKPHOS 44 03/21/2016   BILITOT 0.3 03/21/2016   GFRNONAA >60 03/22/2016   GFRAA >60 03/22/2016    Lab Results  Component Value Date   WBC 2.9 (L) 02/19/2017   NEUTROABS 1.7 02/19/2017   HGB 8.6 (L) 02/19/2017   HCT 27.8 (L) 02/19/2017   MCV 73.5 (L) 02/19/2017   PLT 541 (H) 02/19/2017   Lab Results  Component Value Date   IRON 11 (L) 12/18/2016   TIBC 298 12/18/2016   IRONPCTSAT 4 (L) 12/18/2016   Lab Results  Component Value Date   FERRITIN 9 (L) 12/18/2016     STUDIES: No results found.  ASSESSMENT: Thrombocytosis, iron deficiency anemia  PLAN:    1. Thrombocytosis: Mild. Likely reactive from active bleeding and severe iron deficiency anemia. JAK-2 mutation is negative. No further intervention is needed.  2. Iron deficiency anemia: Secondary to heavy fibroid bleeding. Despite recommendations, patient has refused hysterectomy. She was scheduled to have an endometrial ablation, but this procedure was canceled for unknown reasons.  She now states she has follow-up with OB/GYN at [facility]. Patient's hemoglobin has trended down.  Iron stores are pending at time of dictation.  Previously, the remainder her laboratory work including hemoglobinopathy profile was either negative or within normal limits. Proceed with 510  mg IV Feraheme today. Return to clinic in 2 months with repeat laboratory work and further evaluation. 3. Fibroids: Continue follow-up per gynecology as above. 4.  Leukopenia: Mild, monitor.  Patient expressed understanding and was in agreement with this plan. She also understands that She can call clinic at any time with any questions, concerns, or complaints.    Lloyd Huger, MD   02/19/2017 4:24 PM

## 2017-02-19 ENCOUNTER — Inpatient Hospital Stay (HOSPITAL_BASED_OUTPATIENT_CLINIC_OR_DEPARTMENT_OTHER): Payer: 59 | Admitting: Oncology

## 2017-02-19 ENCOUNTER — Inpatient Hospital Stay: Payer: 59 | Attending: Oncology

## 2017-02-19 ENCOUNTER — Other Ambulatory Visit: Payer: Self-pay | Admitting: Sports Medicine

## 2017-02-19 ENCOUNTER — Inpatient Hospital Stay: Payer: 59

## 2017-02-19 VITALS — BP 168/83 | HR 76 | Temp 98.2°F | Wt 154.3 lb

## 2017-02-19 VITALS — BP 138/79 | HR 61 | Temp 97.5°F | Resp 18

## 2017-02-19 DIAGNOSIS — R531 Weakness: Secondary | ICD-10-CM

## 2017-02-19 DIAGNOSIS — Z8042 Family history of malignant neoplasm of prostate: Secondary | ICD-10-CM | POA: Insufficient documentation

## 2017-02-19 DIAGNOSIS — R5383 Other fatigue: Secondary | ICD-10-CM | POA: Insufficient documentation

## 2017-02-19 DIAGNOSIS — D473 Essential (hemorrhagic) thrombocythemia: Secondary | ICD-10-CM | POA: Insufficient documentation

## 2017-02-19 DIAGNOSIS — Z79899 Other long term (current) drug therapy: Secondary | ICD-10-CM

## 2017-02-19 DIAGNOSIS — G8929 Other chronic pain: Secondary | ICD-10-CM

## 2017-02-19 DIAGNOSIS — M25561 Pain in right knee: Secondary | ICD-10-CM | POA: Diagnosis not present

## 2017-02-19 DIAGNOSIS — I10 Essential (primary) hypertension: Secondary | ICD-10-CM | POA: Insufficient documentation

## 2017-02-19 DIAGNOSIS — R5381 Other malaise: Secondary | ICD-10-CM | POA: Diagnosis not present

## 2017-02-19 DIAGNOSIS — D509 Iron deficiency anemia, unspecified: Secondary | ICD-10-CM | POA: Diagnosis not present

## 2017-02-19 DIAGNOSIS — D72819 Decreased white blood cell count, unspecified: Secondary | ICD-10-CM | POA: Diagnosis not present

## 2017-02-19 DIAGNOSIS — D508 Other iron deficiency anemias: Secondary | ICD-10-CM

## 2017-02-19 DIAGNOSIS — M222X1 Patellofemoral disorders, right knee: Secondary | ICD-10-CM

## 2017-02-19 DIAGNOSIS — D500 Iron deficiency anemia secondary to blood loss (chronic): Secondary | ICD-10-CM

## 2017-02-19 LAB — CBC WITH DIFFERENTIAL/PLATELET
BASOS ABS: 0 10*3/uL (ref 0–0.1)
Basophils Relative: 1 %
Eosinophils Absolute: 0 10*3/uL (ref 0–0.7)
Eosinophils Relative: 2 %
HEMATOCRIT: 27.8 % — AB (ref 35.0–47.0)
Hemoglobin: 8.6 g/dL — ABNORMAL LOW (ref 12.0–16.0)
LYMPHS ABS: 0.8 10*3/uL — AB (ref 1.0–3.6)
LYMPHS PCT: 28 %
MCH: 22.8 pg — AB (ref 26.0–34.0)
MCHC: 31 g/dL — ABNORMAL LOW (ref 32.0–36.0)
MCV: 73.5 fL — AB (ref 80.0–100.0)
Monocytes Absolute: 0.3 10*3/uL (ref 0.2–0.9)
Monocytes Relative: 10 %
NEUTROS ABS: 1.7 10*3/uL (ref 1.4–6.5)
Neutrophils Relative %: 59 %
PLATELETS: 541 10*3/uL — AB (ref 150–440)
RBC: 3.78 MIL/uL — AB (ref 3.80–5.20)
RDW: 20.4 % — ABNORMAL HIGH (ref 11.5–14.5)
WBC: 2.9 10*3/uL — AB (ref 3.6–11.0)

## 2017-02-19 LAB — FERRITIN: Ferritin: 14 ng/mL (ref 11–307)

## 2017-02-19 LAB — IRON AND TIBC
Iron: 14 ug/dL — ABNORMAL LOW (ref 28–170)
SATURATION RATIOS: 5 % — AB (ref 10.4–31.8)
TIBC: 291 ug/dL (ref 250–450)
UIBC: 277 ug/dL

## 2017-02-19 MED ORDER — SODIUM CHLORIDE 0.9 % IV SOLN
510.0000 mg | Freq: Once | INTRAVENOUS | Status: AC
  Administered 2017-02-19: 510 mg via INTRAVENOUS
  Filled 2017-02-19: qty 17

## 2017-02-19 MED ORDER — SODIUM CHLORIDE 0.9 % IV SOLN
INTRAVENOUS | Status: DC
  Administered 2017-02-19: 15:00:00 via INTRAVENOUS
  Filled 2017-02-19: qty 1000

## 2017-02-20 ENCOUNTER — Other Ambulatory Visit: Payer: Self-pay | Admitting: Family Medicine

## 2017-02-20 NOTE — Telephone Encounter (Signed)
MyChart message sent as requested.

## 2017-02-20 NOTE — Telephone Encounter (Signed)
Patient needs help with medication order

## 2017-02-20 NOTE — Telephone Encounter (Signed)
Copied from [place] [id number]. Topic: Inquiry >> Feb 20, 2017  3:01 PM Neva Seat wrote: Cymbalta has to be sent through [facility] - out of medication- having dizziness from not taking the medication  Pt going to  work, will not be able to answer phone tonight - please leave message in MyChart  Pt wants to know how to get the ball rolling for medication.

## 2017-02-26 ENCOUNTER — Other Ambulatory Visit: Payer: Self-pay

## 2017-02-26 MED ORDER — DULOXETINE HCL 30 MG PO CPEP: ORAL_CAPSULE | ORAL | 0 refills | Status: DC

## 2017-02-26 MED ORDER — DULOXETINE HCL 60 MG PO CPEP: ORAL_CAPSULE | ORAL | 0 refills | Status: DC

## 2017-02-26 NOTE — Telephone Encounter (Signed)
See attached

## 2017-02-26 NOTE — Telephone Encounter (Signed)
Copied from [place] [id number]. Topic: Inquiry >> Feb 26, 2017  2:38 PM Anjannette Gauger, [employer], [place] wrote: Reason for BWI:OMBTDHR still needs her refill on her Cymbalta and would like it be sent to her through the [employer]. If someone could please give her a call back. Patient said that she is getting dizzy do to not being on her medication.

## 2017-02-26 NOTE — Telephone Encounter (Signed)
Seen in October. Refilled until next visit.

## 2017-02-27 NOTE — Telephone Encounter (Signed)
Noted  

## 2017-03-05 ENCOUNTER — Ambulatory Visit
Admission: RE | Admit: 2017-03-05 | Discharge: 2017-03-05 | Disposition: A | Discharge status: Home / Self Care | Payer: 59 | Source: Ambulatory Visit | Attending: Sports Medicine | Admitting: Sports Medicine

## 2017-03-05 DIAGNOSIS — M25561 Pain in right knee: Secondary | ICD-10-CM | POA: Insufficient documentation

## 2017-03-05 DIAGNOSIS — D473 Essential (hemorrhagic) thrombocythemia: Secondary | ICD-10-CM | POA: Diagnosis not present

## 2017-03-05 DIAGNOSIS — M222X1 Patellofemoral disorders, right knee: Secondary | ICD-10-CM

## 2017-03-05 DIAGNOSIS — M1711 Unilateral primary osteoarthritis, right knee: Secondary | ICD-10-CM | POA: Insufficient documentation

## 2017-03-05 DIAGNOSIS — M67461 Ganglion, right knee: Secondary | ICD-10-CM | POA: Diagnosis not present

## 2017-03-05 DIAGNOSIS — D649 Anemia, unspecified: Secondary | ICD-10-CM | POA: Diagnosis not present

## 2017-03-05 DIAGNOSIS — G8929 Other chronic pain: Secondary | ICD-10-CM

## 2017-03-08 DIAGNOSIS — M1711 Unilateral primary osteoarthritis, right knee: Secondary | ICD-10-CM | POA: Diagnosis not present

## 2017-03-16 ENCOUNTER — Ambulatory Visit: Payer: 59 | Admitting: Family Medicine

## 2017-03-16 ENCOUNTER — Encounter: Payer: Self-pay | Admitting: Family Medicine

## 2017-03-16 ENCOUNTER — Other Ambulatory Visit: Payer: Self-pay

## 2017-03-16 VITALS — BP 130/70 | HR 75 | Temp 97.5°F | Wt 154.6 lb

## 2017-03-16 DIAGNOSIS — D259 Leiomyoma of uterus, unspecified: Secondary | ICD-10-CM | POA: Diagnosis not present

## 2017-03-16 DIAGNOSIS — F419 Anxiety disorder, unspecified: Secondary | ICD-10-CM | POA: Diagnosis not present

## 2017-03-16 DIAGNOSIS — D500 Iron deficiency anemia secondary to blood loss (chronic): Secondary | ICD-10-CM | POA: Diagnosis not present

## 2017-03-16 DIAGNOSIS — F32A Depression, unspecified: Secondary | ICD-10-CM

## 2017-03-16 DIAGNOSIS — F329 Major depressive disorder, single episode, unspecified: Secondary | ICD-10-CM | POA: Diagnosis not present

## 2017-03-16 MED ORDER — DULOXETINE HCL 30 MG PO CPEP: ORAL_CAPSULE | ORAL | 1 refills | Status: DC

## 2017-03-16 MED ORDER — DULOXETINE HCL 60 MG PO CPEP: ORAL_CAPSULE | ORAL | 1 refills | Status: DC

## 2017-03-16 NOTE — Progress Notes (Signed)
Tommi Rumps, MD Phone: [phone]  Dana Dunlap is a 46 y.o. female who presents today for follow-up.  Anxiety/depression: Patient notes this has gotten worse.  She did not have her Cymbalta for several weeks and it worsened at that point as well.  She has also had significant stress at work where they asked her to work overtime even though she is on medical limits.  She has been having panic attacks.  She has been seeing a therapist which has been helpful.  She just feels hopeless with her job.  She thinks about death though has no thoughts of killing herself.  She wonders when she is going to die at times.  Has never been on anything other than Cymbalta.  Notes that the symptoms have improved somewhat since going back on the Cymbalta.  Patient has been anemic related to heavy fibroid bleeding.  She has been following with hematology for iron transfusions.  She notes she feels about the same though her numbers have been stable.  She has been unwilling to undergo hysterectomy.  She has been in contact with gynecology at [facility] though has not set up an appointment.  I encouraged her to do this today.  Continues to have heavy fibroid bleeding.  Social History   Tobacco Use  Smoking Status Never Smoker  Smokeless Tobacco Never Used     ROS see history of present illness  Objective  Physical Exam Vitals:   03/16/17 1505  BP: 130/70  Pulse: 75  Temp: (!) 97.5 F (36.4 C)  SpO2: 98%    BP Readings from Last 3 Encounters:  03/16/17 130/70  02/19/17 138/79  02/19/17 (!) 168/83   Wt Readings from Last 3 Encounters:  03/16/17 154 lb 9.6 oz (70.1 kg)  02/19/17 154 lb 4.8 oz (70 kg)  01/08/17 153 lb 9.6 oz (69.7 kg)    Physical Exam  Constitutional: No distress.  Cardiovascular: Normal rate, regular rhythm and normal heart sounds.  Pulmonary/Chest: Effort normal and breath sounds normal.  Musculoskeletal: She exhibits no edema.  Neurological: She is alert. Gait normal.    Skin: Skin is warm and dry. She is not diaphoretic.  Psychiatric:  Mood depressed, affect flat     Assessment/Plan: Please see individual problem list.  Fibroid, uterine This is the cause of her bleeding.  She needs some type of intervention for this.  I advised her that she needs to call gynecology at [facility] to get an appointment set up as it may take some time to get into see them.  Anemia, iron deficiency Patient with anemia related to uterine fibroid bleeding.  Hemoglobin has been relatively stable and she is following with hematology.  Patient does report some concern regarding abnormal marrow signal seen on recent MRI of her knee.  We will send a message to the patient's hematologist regarding this.  Anxiety and depression Patient with fairly significant anxiety and depression.  Has been having panic attacks.  The Klonopin has been beneficial.  She feels a little bit better since going back on the Cymbalta.  She will continue to follow with the therapist.  We will refer to psychiatry.  She is asking for a leave of absence from work and I think this would be reasonable.  We will fill out the paperwork when we receive it.   Dana Dunlap was seen today for follow-up.  Diagnoses and all orders for this visit:  Anxiety and depression -     Ambulatory referral to Psychiatry  Uterine leiomyoma,  unspecified location  Iron deficiency anemia due to chronic blood loss  Other orders -     DULoxetine (CYMBALTA) 30 MG capsule; Take 1 capsule by mouth  daily , take with 60mg  for  total 90mg  daily -     DULoxetine (CYMBALTA) 60 MG capsule; Take 1 capsule by mouth  daily , take with 30mg  for  total 90mg  daily    Orders Placed This Encounter  Procedures  . Ambulatory referral to Psychiatry    Referral Priority:   Routine    Referral Type:   Psychiatric    Referral Reason:   Specialty Services Required    Requested Specialty:   Psychiatry    Number of Visits Requested:   1    Meds ordered  this encounter  Medications  . DULoxetine (CYMBALTA) 30 MG capsule    Sig: Take 1 capsule by mouth  daily , take with 60mg  for  total 90mg  daily    Dispense:  90 capsule    Refill:  1  . DULoxetine (CYMBALTA) 60 MG capsule    Sig: Take 1 capsule by mouth  daily , take with 30mg  for  total 90mg  daily    Dispense:  90 capsule    Refill:  [place], MD [place]

## 2017-03-16 NOTE — Assessment & Plan Note (Addendum)
Patient with anemia related to uterine fibroid bleeding.  Hemoglobin has been relatively stable and she is following with hematology.  Patient does report some concern regarding abnormal marrow signal seen on recent MRI of her knee.  We will send a message to the patient's hematologist regarding this.

## 2017-03-16 NOTE — Assessment & Plan Note (Signed)
This is the cause of her bleeding.  She needs some type of intervention for this.  I advised her that she needs to call gynecology at [facility] to get an appointment set up as it may take some time to get into see them.

## 2017-03-16 NOTE — Assessment & Plan Note (Signed)
Patient with fairly significant anxiety and depression.  Has been having panic attacks.  The Klonopin has been beneficial.  She feels a little bit better since going back on the Cymbalta.  She will continue to follow with the therapist.  We will refer to psychiatry.  She is asking for a leave of absence from work and I think this would be reasonable.  We will fill out the paperwork when we receive it.

## 2017-03-16 NOTE — Patient Instructions (Addendum)
Nice to see you. Please continue to see the therapist.  We will get you to see psychiatry.  Please continue your Cymbalta. Please get into see gynecology at [facility] as soon as you are able to. If you develop thoughts of harming your self please be evaluated immediately.

## 2017-03-18 ENCOUNTER — Telehealth: Payer: Self-pay | Admitting: Family Medicine

## 2017-03-18 NOTE — Telephone Encounter (Signed)
-----   Message from Lloyd Huger, MD sent at 03/17/2017  7:23 AM EST ----- Thanks Randall Hiss.  My guess is you are correct and she has a hyperactive marrow secondary to her persistent anemia.  I do not think I've ever discussed a BMBx with her, but it would be reasonable to have that conversation.  I can do that at her next clinic visit.  Thanks!  -Tim   ----- Message ----- From: Leone Haven, MD Sent: 03/16/2017   5:09 PM To: Lloyd Huger, MD  Hi Dr Grayland Ormond,   I saw Mrs Kellett in follow up today and she reported concern regarding a finding on a recent MRI of her right knee. I have included the part of the report she found concerning.   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 I interpreted this as being due to her chronic anemia from her uterine bleeding leading to her bone marrow being hyperactive. Please let me know what you think. Thanks for your help.  Randall Hiss

## 2017-03-18 NOTE — Telephone Encounter (Signed)
Please let the patient know that I heard back from her hematologist. He agreed that the MRI finding in her bone marrow is likely related to her anemia causing a hyperactive marrow response. He advised that he would discuss further with her at her next visit. Thanks.

## 2017-03-19 ENCOUNTER — Telehealth: Payer: Self-pay | Admitting: Family Medicine

## 2017-03-19 NOTE — Telephone Encounter (Unsigned)
Copied from [place] [phone]. Topic: Inquiry >> Mar 19, 2017 10:19 AM Neva Seat wrote: [facility] - [phone]  ext. [phone]  Faxed an Attending Providers Statement on Fri 14.   For Dr. [name].  She needs to know if the office received the form.  Please call her back at the above number.

## 2017-03-19 NOTE — Telephone Encounter (Signed)
Attempted to call patient- no answer- voice mail full

## 2017-03-19 NOTE — Telephone Encounter (Signed)
Left message to return call, ok for pec to speak with patient 

## 2017-03-19 NOTE — Telephone Encounter (Signed)
Please advise 

## 2017-03-19 NOTE — Telephone Encounter (Signed)
Received place on Dr.Sonnenbergs desk

## 2017-03-19 NOTE — Telephone Encounter (Signed)
Pt. Given results and instructions. Verbalizes understanding. 

## 2017-03-20 NOTE — Telephone Encounter (Signed)
Dana Dunlap  Nurse with [facility] called back and aware fax is on the dr's desk.

## 2017-04-06 ENCOUNTER — Telehealth: Payer: Self-pay | Admitting: *Deleted

## 2017-04-06 NOTE — Telephone Encounter (Signed)
Placed in your folder.

## 2017-04-06 NOTE — Telephone Encounter (Signed)
Copied from [place] [phone]. Topic: Inquiry >> Apr 05, 2017  5:58 PM Oliver Pila B wrote: Reason for CRM: Gerald Stabs from the [facility] called to state, that they havent received the objective for the medical report for how her condition keeps her from working, its due by Monday the 7th of January, fax to [phone] and call  [phone]

## 2017-04-11 ENCOUNTER — Encounter: Payer: Self-pay | Admitting: Family Medicine

## 2017-04-11 ENCOUNTER — Telehealth: Payer: Self-pay | Admitting: Family Medicine

## 2017-04-11 NOTE — Telephone Encounter (Signed)
This encounter was created in error - please disregard.

## 2017-04-11 NOTE — Telephone Encounter (Signed)
Please fax form. It has been completed. Please print note and fax. Thanks.

## 2017-04-11 NOTE — Telephone Encounter (Signed)
Copied from [place] [phone]. Topic: Quick Communication - See Telephone Encounter >> Apr 11, 2017 12:23 PM Robina Ade, Helene Kelp D wrote: CRM for notification. See Telephone encounter for: 04/11/17. Patient called and she would like to talk to Dr. [name] CMA about her fmla paperwork. Patient is very upset because she feels like she is getting a run around. Please call patient back, thanks.

## 2017-04-11 NOTE — Telephone Encounter (Signed)
Form has been faxed, ok for pec to inform patient that the form has been faxed

## 2017-04-11 NOTE — Telephone Encounter (Signed)
Pt called and made aware forms have been faxed.

## 2017-04-11 NOTE — Telephone Encounter (Signed)
Called patient, no vm.will try again at later time

## 2017-04-12 NOTE — Telephone Encounter (Signed)
Patient states she just needs a note to verify that it is ok for her to return to work.Patient states its stressful to go back but she does feel better since she was able to rest.

## 2017-04-12 NOTE — Telephone Encounter (Signed)
Letter created.  Please print for her to pick up.

## 2017-04-12 NOTE — Telephone Encounter (Signed)
Copied from [place] [phone]. Topic: General - Other >> Apr 12, 2017 10:55 AM Valla Leaver wrote: Reason for CRM: Patient needs a Fit for Duty completed by tomorrow 04/13/2017 at 2pm. Please let her know when this can be completed. She has to  have it before you can return to work.

## 2017-04-13 NOTE — Telephone Encounter (Signed)
Patient notified letter is at front desk ready to be picked up

## 2017-04-16 ENCOUNTER — Ambulatory Visit: Payer: 59 | Admitting: Family Medicine

## 2017-04-24 ENCOUNTER — Ambulatory Visit: Payer: 59 | Admitting: Oncology

## 2017-04-24 ENCOUNTER — Telehealth: Payer: Self-pay | Admitting: Family Medicine

## 2017-04-24 ENCOUNTER — Other Ambulatory Visit: Payer: 59

## 2017-04-24 ENCOUNTER — Ambulatory Visit: Payer: 59

## 2017-04-24 NOTE — Telephone Encounter (Signed)
Please advise 

## 2017-04-24 NOTE — Telephone Encounter (Signed)
Left message to return call, I have called [facility], they were unable to confirm if they have received the fax until tomorrow. They stated as soon as they receive the fax they will work on the getting everything corrected.they informed me that it should show in the system tomorrow and patient can call for update tomorrow. [place] for pec to speak to patient to notify

## 2017-04-24 NOTE — Telephone Encounter (Signed)
Copied from [place]. Topic: Quick Communication - See Telephone Encounter >> Apr 24, 2017  2:04 PM Burnis Medin, NT wrote: CRM for notification. See Telephone encounter for: Patient is calling to see if the nurse could call her back regarding her medical information not being faxed to the [place].Patient would like a call back.   04/24/17.

## 2017-04-25 ENCOUNTER — Telehealth: Payer: Self-pay

## 2017-04-25 NOTE — Telephone Encounter (Signed)
Copied from [place] [phone]. Topic: Compliment - Staff >> Apr 25, 2017  8:38 AM Aurelio Brash B wrote: Date of encounter:1/22/ Details of compliment: Pt has been frustrated over the [facility] process  when relaying the crm message to her from [place], [place]-  she wanted to thank Janett Billow for working on this for her.  Who would the patient like to thank/see rewarded?  On a scale of 1-10, how was your experience?   Route to Engineer, building services.

## 2017-04-25 NOTE — Telephone Encounter (Signed)
Message given by pec

## 2017-04-30 ENCOUNTER — Ambulatory Visit: Payer: 59

## 2017-04-30 ENCOUNTER — Other Ambulatory Visit: Payer: 59

## 2017-04-30 ENCOUNTER — Ambulatory Visit: Payer: 59 | Admitting: Oncology

## 2017-05-04 ENCOUNTER — Other Ambulatory Visit: Payer: Self-pay | Admitting: *Deleted

## 2017-05-04 DIAGNOSIS — D649 Anemia, unspecified: Secondary | ICD-10-CM

## 2017-05-05 NOTE — Progress Notes (Signed)
[place]  [phone] [phone]  ID: Dana Dunlap OB: 11-Feb-1971  MR#: [id number]  FYB#:[id number]  Patient Care Team: Leone Haven, MD as PCP - General (Family Medicine)  CHIEF COMPLAINT: Thrombocytosis, iron deficiency anemia.  INTERVAL HISTORY: Patient returns to clinic today for repeat laboratory work, further evaluation, and consideration of IV Feraheme. She continues to have heavy menses, but was unable to make her gynecology appointment secondary to personal issues. She continues to have persistent weakness and fatigue. She has no neurologic complaints. She has no chest pain or shortness of breath. She denies any nausea, vomiting, constipation, or diarrhea. She has no melena or hematochezia. She has no urinary complaints. Patient offers no further specific complaints today.  REVIEW OF SYSTEMS:   Review of Systems  Constitutional: Positive for malaise/fatigue. Negative for fever and weight loss.  HENT: Negative.  Negative for congestion and sore throat.   Respiratory: Negative.  Negative for cough and shortness of breath.   Cardiovascular: Negative.  Negative for chest pain and leg swelling.  Gastrointestinal: Negative.  Negative for abdominal pain and blood in stool.  Genitourinary: Negative.   Musculoskeletal: Positive for joint pain.  Skin: Negative.  Negative for rash.  Neurological: Positive for weakness.  Psychiatric/Behavioral: Negative for depression. The patient is not nervous/anxious.     As per HPI. Otherwise, a complete review of systems is negative.  PAST MEDICAL HISTORY: Past Medical History:  Diagnosis Date  . Depression    Currently on Cymbalta  . Fibroids   . Frequent headaches    Worse during monthly menstrual cycle  . Hypertension     PAST SURGICAL HISTORY: Past Surgical History:  Procedure Laterality Date  . BREAST MASS EXCISION     benign    FAMILY HISTORY: Family History  Problem Relation Age  of Onset  . Hypertension Mother   . Hyperlipidemia Mother   . Cancer Father        Prostate cancer  . Hypertension Father   . Hyperlipidemia Father   . Stroke Maternal Aunt   . Stroke Paternal Aunt   . Diabetes Paternal Aunt     ADVANCED DIRECTIVES (Y/N):  N  HEALTH MAINTENANCE: Social History   Tobacco Use  . Smoking status: Never Smoker  . Smokeless tobacco: Never Used  Substance Use Topics  . Alcohol use: Yes    Comment: occasional alcohol use  . Drug use: No     Colonoscopy:  PAP:  Bone density:  Lipid panel:  Allergies  Allergen Reactions  . Compazine [Prochlorperazine Edisylate] Other (See Comments)    Seizure     Current Outpatient Medications  Medication Sig Dispense Refill  . clonazePAM (KLONOPIN) 0.5 MG tablet Take 1 tablet (0.5 mg total) by mouth 2 (two) times daily as needed for anxiety. 60 tablet 0  . DULoxetine (CYMBALTA) 30 MG capsule Take 1 capsule by mouth  daily , take with 60mg  for  total 90mg  daily 90 capsule 1  . DULoxetine (CYMBALTA) 60 MG capsule Take 1 capsule by mouth  daily , take with 30mg  for  total 90mg  daily 90 capsule 1  . ferrous sulfate (FEOSOL) 325 (65 FE) MG tablet Take 1 tablet (325 mg total) by mouth 2 (two) times daily with a meal. 60 tablet 3   No current facility-administered medications for this visit.     OBJECTIVE: Vitals:   05/08/17 1030  BP: (!) 147/64  Pulse: 74  Temp: 97.8 F (36.6 C)  Body mass index is 28.7 kg/m.    ECOG FS:0 - Asymptomatic  General: Well-developed, well-nourished, no acute distress. Eyes: Pink conjunctiva, anicteric sclera. Lungs: Clear to auscultation bilaterally. Heart: Regular rate and rhythm. No rubs, murmurs, or gallops. Abdomen: Soft, nontender, nondistended. No organomegaly noted, normoactive bowel sounds. Musculoskeletal: No edema, cyanosis, or clubbing. Neuro: Alert, answering all questions appropriately. Cranial nerves grossly intact. Skin: No rashes or petechiae  noted. Psych: Normal affect.   LAB RESULTS:  Lab Results  Component Value Date   NA 141 03/22/2016   K 3.9 03/22/2016   CL 111 03/22/2016   CO2 24 03/22/2016   GLUCOSE 106 (H) 03/22/2016   BUN 10 03/22/2016   CREATININE 0.80 03/22/2016   CALCIUM 9.1 03/22/2016   PROT 7.3 03/21/2016   ALBUMIN 4.1 03/21/2016   AST 11 03/21/2016   ALT 7 03/21/2016   ALKPHOS 44 03/21/2016   BILITOT 0.3 03/21/2016   GFRNONAA >60 03/22/2016   GFRAA >60 03/22/2016    Lab Results  Component Value Date   WBC 3.0 (L) 05/08/2017   NEUTROABS 1.7 05/08/2017   HGB 9.2 (L) 05/08/2017   HCT 30.1 (L) 05/08/2017   MCV 66.5 (L) 05/08/2017   PLT 470 (H) 05/08/2017   Lab Results  Component Value Date   IRON 14 (L) 05/08/2017   TIBC 322 05/08/2017   IRONPCTSAT 4 (L) 05/08/2017   Lab Results  Component Value Date   FERRITIN 10 (L) 05/08/2017     STUDIES: No results found.  ASSESSMENT: Thrombocytosis, iron deficiency anemia  PLAN:    1. Thrombocytosis: Mild. Likely reactive from active bleeding and severe iron deficiency anemia. JAK-2 mutation is negative. No further intervention is needed.  2. Iron deficiency anemia: Secondary to heavy fibroid bleeding. Despite recommendations, patient has refused hysterectomy. She has had multiple appointments with gynecology for evaluation, but has missed or canceled much of these appointments.  Patient has agreed to reschedule in the near future.  Patient's hemoglobin and iron stores continue to be decreased. Previously, the remainder her laboratory work including hemoglobinopathy profile was either negative or within normal limits. Proceed with 510 mg IV Feraheme today. Return to clinic in 1 week for second infusion and then in 3 months with repeat laboratory work and further evaluation. 3. Fibroids: Continue follow-up per gynecology as above. 4.  Leukopenia: Mild, monitor.  Approximately 30 minutes was spent in discussion of which greater than 50% was  consultation.  Patient expressed understanding and was in agreement with this plan. She also understands that She can call clinic at any time with any questions, concerns, or complaints.    Lloyd Huger, MD   05/11/2017 3:56 PM

## 2017-05-08 ENCOUNTER — Encounter: Payer: Self-pay | Admitting: Oncology

## 2017-05-08 ENCOUNTER — Inpatient Hospital Stay: Payer: 59 | Attending: Oncology

## 2017-05-08 ENCOUNTER — Inpatient Hospital Stay: Payer: 59

## 2017-05-08 ENCOUNTER — Inpatient Hospital Stay (HOSPITAL_BASED_OUTPATIENT_CLINIC_OR_DEPARTMENT_OTHER): Payer: 59 | Admitting: Oncology

## 2017-05-08 ENCOUNTER — Other Ambulatory Visit: Payer: Self-pay

## 2017-05-08 VITALS — BP 147/64 | HR 74 | Temp 97.8°F | Wt 151.9 lb

## 2017-05-08 DIAGNOSIS — D259 Leiomyoma of uterus, unspecified: Secondary | ICD-10-CM | POA: Insufficient documentation

## 2017-05-08 DIAGNOSIS — D75839 Thrombocytosis, unspecified: Secondary | ICD-10-CM

## 2017-05-08 DIAGNOSIS — D500 Iron deficiency anemia secondary to blood loss (chronic): Secondary | ICD-10-CM

## 2017-05-08 DIAGNOSIS — D473 Essential (hemorrhagic) thrombocythemia: Secondary | ICD-10-CM | POA: Diagnosis not present

## 2017-05-08 DIAGNOSIS — D649 Anemia, unspecified: Secondary | ICD-10-CM

## 2017-05-08 DIAGNOSIS — D72819 Decreased white blood cell count, unspecified: Secondary | ICD-10-CM

## 2017-05-08 DIAGNOSIS — D508 Other iron deficiency anemias: Secondary | ICD-10-CM

## 2017-05-08 LAB — CBC WITH DIFFERENTIAL/PLATELET
BASOS PCT: 1 %
Basophils Absolute: 0 10*3/uL (ref 0–0.1)
Eosinophils Absolute: 0.1 10*3/uL (ref 0–0.7)
Eosinophils Relative: 3 %
HEMATOCRIT: 30.1 % — AB (ref 35.0–47.0)
Hemoglobin: 9.2 g/dL — ABNORMAL LOW (ref 12.0–16.0)
Lymphocytes Relative: 29 %
Lymphs Abs: 0.9 10*3/uL — ABNORMAL LOW (ref 1.0–3.6)
MCH: 20.4 pg — AB (ref 26.0–34.0)
MCHC: 30.7 g/dL — AB (ref 32.0–36.0)
MCV: 66.5 fL — ABNORMAL LOW (ref 80.0–100.0)
MONO ABS: 0.3 10*3/uL (ref 0.2–0.9)
MONOS PCT: 10 %
Neutro Abs: 1.7 10*3/uL (ref 1.4–6.5)
Neutrophils Relative %: 57 %
Platelets: 470 10*3/uL — ABNORMAL HIGH (ref 150–440)
RBC: 4.54 MIL/uL (ref 3.80–5.20)
RDW: 18.9 % — ABNORMAL HIGH (ref 11.5–14.5)
WBC: 3 10*3/uL — ABNORMAL LOW (ref 3.6–11.0)

## 2017-05-08 LAB — IRON AND TIBC
Iron: 14 ug/dL — ABNORMAL LOW (ref 28–170)
SATURATION RATIOS: 4 % — AB (ref 10.4–31.8)
TIBC: 322 ug/dL (ref 250–450)
UIBC: 308 ug/dL

## 2017-05-08 LAB — FERRITIN: Ferritin: 10 ng/mL — ABNORMAL LOW (ref 11–307)

## 2017-05-08 MED ORDER — SODIUM CHLORIDE 0.9 % IV SOLN
Freq: Once | INTRAVENOUS | Status: AC
  Administered 2017-05-08: 11:00:00 via INTRAVENOUS
  Filled 2017-05-08: qty 1000

## 2017-05-08 MED ORDER — FERUMOXYTOL INJECTION 510 MG/17 ML
510.0000 mg | Freq: Once | INTRAVENOUS | Status: AC
  Administered 2017-05-08: 510 mg via INTRAVENOUS
  Filled 2017-05-08 (×3): qty 17

## 2017-05-14 ENCOUNTER — Ambulatory Visit: Payer: 59

## 2017-05-21 ENCOUNTER — Inpatient Hospital Stay: Payer: 59

## 2017-05-21 VITALS — BP 140/64 | HR 72 | Temp 98.1°F | Resp 18

## 2017-05-21 DIAGNOSIS — D500 Iron deficiency anemia secondary to blood loss (chronic): Secondary | ICD-10-CM | POA: Diagnosis not present

## 2017-05-21 DIAGNOSIS — D508 Other iron deficiency anemias: Secondary | ICD-10-CM

## 2017-05-21 MED ORDER — SODIUM CHLORIDE 0.9 % IV SOLN
510.0000 mg | Freq: Once | INTRAVENOUS | Status: AC
  Administered 2017-05-21: 510 mg via INTRAVENOUS
  Filled 2017-05-21: qty 17

## 2017-05-21 MED ORDER — SODIUM CHLORIDE 0.9 % IV SOLN
INTRAVENOUS | Status: DC
  Administered 2017-05-21: 14:00:00 via INTRAVENOUS
  Filled 2017-05-21: qty 1000

## 2017-05-28 ENCOUNTER — Ambulatory Visit: Payer: 59 | Admitting: Psychiatry

## 2017-05-28 ENCOUNTER — Other Ambulatory Visit: Payer: Self-pay

## 2017-05-28 ENCOUNTER — Encounter: Payer: Self-pay | Admitting: Psychiatry

## 2017-05-28 VITALS — BP 134/80 | HR 66 | Temp 98.9°F | Wt 152.6 lb

## 2017-05-28 DIAGNOSIS — F331 Major depressive disorder, recurrent, moderate: Secondary | ICD-10-CM | POA: Diagnosis not present

## 2017-05-28 DIAGNOSIS — F411 Generalized anxiety disorder: Secondary | ICD-10-CM | POA: Diagnosis not present

## 2017-05-28 MED ORDER — HYDROXYZINE PAMOATE 25 MG PO CAPS: 25.0000 mg | ORAL_CAPSULE | Freq: Three times a day (TID) | ORAL | 1 refills | Status: DC | PRN

## 2017-05-28 MED ORDER — BUPROPION HCL 75 MG PO TABS: 75.0000 mg | ORAL_TABLET | Freq: Every morning | ORAL | 1 refills | Status: DC

## 2017-05-28 MED ORDER — DULOXETINE HCL 60 MG PO CPEP: 60.0000 mg | ORAL_CAPSULE | Freq: Every day | ORAL | 1 refills | Status: DC

## 2017-05-28 NOTE — Patient Instructions (Signed)
       Bupropion tablets (Depression/Mood Disorders) What is this medicine? BUPROPION (byoo PROE pee on) is used to treat depression. This medicine may be used for other purposes; ask your health care provider or pharmacist if you have questions. COMMON BRAND NAME(S): Wellbutrin What should I tell my health care provider before I take this medicine? They need to know if you have any of these conditions: -an eating disorder, such as anorexia or bulimia -bipolar disorder or psychosis -diabetes or high blood sugar, treated with medication -glaucoma -heart disease, previous heart attack, or irregular heart beat -head injury or brain tumor -high blood pressure -kidney or liver disease -seizures -suicidal thoughts or a previous suicide attempt -Tourette's syndrome -weight loss -an unusual or allergic reaction to bupropion, other medicines, foods, dyes, or preservatives -breast-feeding -pregnant or trying to become pregnant How should I use this medicine? Take this medicine by mouth with a glass of water. Follow the directions on the prescription label. You can take it with or without food. If it upsets your stomach, take it with food. Take your medicine at regular intervals. Do not take your medicine more often than directed. Do not stop taking this medicine suddenly except upon the advice of your doctor. Stopping this medicine too quickly may cause serious side effects or your condition may worsen. A special MedGuide will be given to you by the pharmacist with each prescription and refill. Be sure to read this information carefully each time. Talk to your pediatrician regarding the use of this medicine in children. Special care may be needed. Overdosage: If you think you have taken too much of this medicine contact a poison control center or emergency room at once. NOTE: This medicine is only for you. Do not share this medicine with others. What if I miss a dose? If you miss a dose,  take it as soon as you can. If it is less than four hours to your next dose, take only that dose and skip the missed dose. Do not take double or extra doses. What may interact with this medicine? Do not take this medicine with any of the following medications: -linezolid -MAOIs like Azilect, Carbex, Eldepryl, Marplan, Nardil, and Parnate -methylene blue (injected into a vein) -other medicines that contain bupropion like Zyban This medicine may also interact with the following medications: -alcohol -certain medicines for anxiety or sleep -certain medicines for blood pressure like metoprolol, propranolol -certain medicines for depression or psychotic disturbances -certain medicines for HIV or AIDS like efavirenz, lopinavir, nelfinavir, ritonavir -certain medicines for irregular heart beat like propafenone, flecainide -certain medicines for Parkinson's disease like amantadine, levodopa -certain medicines for seizures like carbamazepine, phenytoin, phenobarbital -cimetidine -clopidogrel -cyclophosphamide -digoxin -furazolidone -isoniazid -nicotine -orphenadrine -procarbazine -steroid medicines like prednisone or cortisone -stimulant medicines for attention disorders, weight loss, or to stay awake -tamoxifen -theophylline -thiotepa -ticlopidine -tramadol -warfarin This list may not describe all possible interactions. Give your health care provider a list of all the medicines, herbs, non-prescription drugs, or dietary supplements you use. Also tell them if you smoke, drink alcohol, or use illegal drugs. Some items may interact with your medicine. What should I watch for while using this medicine? Tell your doctor if your symptoms do not get better or if they get worse. Visit your doctor or health care professional for regular checks on your progress. Because it may take several weeks to see the full effects of this medicine, it is important to continue your treatment as prescribed by    your doctor. Patients and their families should watch out for new or worsening thoughts of suicide or depression. Also watch out for sudden changes in feelings such as feeling anxious, agitated, panicky, irritable, hostile, aggressive, impulsive, severely restless, overly excited and hyperactive, or not being able to sleep. If this happens, especially at the beginning of treatment or after a change in dose, call your health care professional. Avoid alcoholic drinks while taking this medicine. Drinking excessive alcoholic beverages, using sleeping or anxiety medicines, or quickly stopping the use of these agents while taking this medicine may increase your risk for a seizure. Do not drive or use heavy machinery until you know how this medicine affects you. This medicine can impair your ability to perform these tasks. Do not take this medicine close to bedtime. It may prevent you from sleeping. Your mouth may get dry. Chewing sugarless gum or sucking hard candy, and drinking plenty of water may help. Contact your doctor if the problem does not go away or is severe. What side effects may I notice from receiving this medicine? Side effects that you should report to your doctor or health care professional as soon as possible: -allergic reactions like skin rash, itching or hives, swelling of the face, lips, or tongue -breathing problems -changes in vision -confusion -elevated mood, decreased need for sleep, racing thoughts, impulsive behavior -fast or irregular heartbeat -hallucinations, loss of contact with reality -increased blood pressure -redness, blistering, peeling or loosening of the skin, including inside the mouth -seizures -suicidal thoughts or other mood changes -unusually weak or tired -vomiting Side effects that usually do not require medical attention (report to your doctor or health care professional if they continue or are bothersome): -constipation -headache -loss of  appetite -nausea -tremors -weight loss This list may not describe all possible side effects. Call your doctor for medical advice about side effects. You may report side effects to FDA at 1-800-FDA-1088. Where should I keep my medicine? Keep out of the reach of children. Store at room temperature between 20 and 25 degrees C (68 and 77 degrees F), away from direct sunlight and moisture. Keep tightly closed. Throw away any unused medicine after the expiration date. NOTE: This sheet is a summary. It may not cover all possible information. If you have questions about this medicine, talk to your doctor, pharmacist, or health care provider.  2018 [employer] Standard (2015-09-10 13:44:21)  
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
## 2017-06-11 ENCOUNTER — Encounter: Payer: Self-pay | Admitting: Family Medicine

## 2017-06-11 ENCOUNTER — Other Ambulatory Visit: Payer: Self-pay

## 2017-06-11 ENCOUNTER — Ambulatory Visit: Payer: 59 | Admitting: Family Medicine

## 2017-06-11 VITALS — BP 130/78 | HR 70 | Temp 97.8°F | Ht 61.0 in | Wt 152.8 lb

## 2017-06-11 DIAGNOSIS — Z1231 Encounter for screening mammogram for malignant neoplasm of breast: Secondary | ICD-10-CM | POA: Diagnosis not present

## 2017-06-11 DIAGNOSIS — D500 Iron deficiency anemia secondary to blood loss (chronic): Secondary | ICD-10-CM | POA: Diagnosis not present

## 2017-06-11 DIAGNOSIS — F329 Major depressive disorder, single episode, unspecified: Secondary | ICD-10-CM | POA: Diagnosis not present

## 2017-06-11 DIAGNOSIS — Z1239 Encounter for other screening for malignant neoplasm of breast: Secondary | ICD-10-CM

## 2017-06-11 DIAGNOSIS — N920 Excessive and frequent menstruation with regular cycle: Secondary | ICD-10-CM | POA: Diagnosis not present

## 2017-06-11 DIAGNOSIS — F419 Anxiety disorder, unspecified: Secondary | ICD-10-CM | POA: Diagnosis not present

## 2017-06-11 DIAGNOSIS — F32A Depression, unspecified: Secondary | ICD-10-CM

## 2017-06-11 LAB — COMPREHENSIVE METABOLIC PANEL
ALBUMIN: 3.7 g/dL (ref 3.5–5.2)
ALT: 9 U/L (ref 0–35)
AST: 13 U/L (ref 0–37)
Alkaline Phosphatase: 36 U/L — ABNORMAL LOW (ref 39–117)
BUN: 6 mg/dL (ref 6–23)
CALCIUM: 9.2 mg/dL (ref 8.4–10.5)
CHLORIDE: 107 meq/L (ref 96–112)
CO2: 26 mEq/L (ref 19–32)
CREATININE: 0.85 mg/dL (ref 0.40–1.20)
GFR: 91.35 mL/min (ref >60.00)
Glucose, Bld: 88 mg/dL (ref 70–99)
Potassium: 3.8 mEq/L (ref 3.5–5.1)
Sodium: 138 mEq/L (ref 135–145)
Total Bilirubin: 0.3 mg/dL (ref 0.2–1.2)
Total Protein: 6.8 g/dL (ref 6.0–8.3)

## 2017-06-11 NOTE — Progress Notes (Signed)
Tommi Rumps, MD Phone: [phone]  Dana Dunlap is a 47 y.o. female who presents today for f/u.  Anxiety/depression: Patient notes this is somewhat better.  She saw psychiatry and they placed her on Wellbutrin.  They decreased her Cymbalta dose.  They also started her on hydroxyzine which does make her groggy.  She is getting set up with therapy.  She notes no SI.  She does appear to be improved as she was not tearful today in the office and was smiling.  She continues to have heavy vaginal bleeding 10 days a month.  This has been going on for a long time.  She has yet to see gynecology at [facility] and she just needs to call them to set up the appointment as she got a letter asking her to do this.  She has been getting infusions through hematology for her anemia.  She notes no chest pain, shortness of breath, or lightheadedness.  She reports Pap smear about 2 years ago. We will request records.  Her psychiatrist recommended having a TSH drawn.  She does have some tiredness and fatigue though that is likely related to anxiety/depression or her anemia.  She notes some dry skin.  No hair loss.  Social History   Tobacco Use  Smoking Status Never Smoker  Smokeless Tobacco Never Used     ROS see history of present illness  Objective  Physical Exam Vitals:   06/11/17 1034  BP: 130/78  Pulse: 70  Temp: 97.8 F (36.6 C)  SpO2: 100%    BP Readings from Last 3 Encounters:  06/11/17 130/78  05/28/17 134/80  05/21/17 140/64   Wt Readings from Last 3 Encounters:  06/11/17 152 lb 12.8 oz (69.3 kg)  05/28/17 152 lb 9.6 oz (69.2 kg)  05/08/17 151 lb 14.4 oz (68.9 kg)    Physical Exam  Constitutional: No distress.  Cardiovascular: Normal rate, regular rhythm and normal heart sounds.  Pulmonary/Chest: Effort normal and breath sounds normal.  Musculoskeletal: She exhibits no edema.  Neurological: She is alert. Gait normal.  Skin: Skin is warm and dry. She is not diaphoretic.      Assessment/Plan: Please see individual problem list.  Excessive and frequent menstruation Related to uterine fibroids.  She has been advised to have a hysterectomy on several occasions though continues to not want to do this.  She has been referred to gynecology at [facility] to consider alternative treatments.  She needs to contact them to set up the appointment.  We will check a TSH to ensure this is not contributing to frequent menstruation as well as her tiredness.  CMP to be checked as well.  Anxiety and depression Seems to have improved.  She will continue to follow with psychiatry.  She will contact them regarding her hydroxyzine.  Continue other medications.  Anemia, iron deficiency Hemoglobin slightly improved on last check.  She will continue to follow with hematology.  She will be evaluated if she develops symptoms.   Orders Placed This Encounter  Procedures  . MM SCREENING BREAST TOMO BILATERAL    Standing Status:   Future    Standing Expiration Date:   08/12/2018    Order Specific Question:   Reason for Exam (SYMPTOM  OR DIAGNOSIS REQUIRED)    Answer:   breast cancer screening    Order Specific Question:   Is the patient pregnant?    Answer:   No    Order Specific Question:   Preferred imaging location?    Answer:  [facility]  . TSH  . Comp Met (CMET)    No orders of the defined types were placed in this encounter.    Tommi Rumps, MD [place]

## 2017-06-11 NOTE — Patient Instructions (Signed)
Nice to see you. Please contact the psychiatrists office and let them know about the hydroxyzine making you drowsy. Please try to see gynecology for your uterine bleeding. We will check a TSH today.

## 2017-06-11 NOTE — Assessment & Plan Note (Signed)
Seems to have improved.  She will continue to follow with psychiatry.  She will contact them regarding her hydroxyzine.  Continue other medications.

## 2017-06-11 NOTE — Assessment & Plan Note (Signed)
Hemoglobin slightly improved on last check.  She will continue to follow with hematology.  She will be evaluated if she develops symptoms.

## 2017-06-11 NOTE — Assessment & Plan Note (Addendum)
Related to uterine fibroids.  She has been advised to have a hysterectomy on several occasions though continues to not want to do this.  She has been referred to gynecology at [facility] to consider alternative treatments.  She needs to contact them to set up the appointment.  We will check a TSH to ensure this is not contributing to frequent menstruation as well as her tiredness.  CMP to be checked as well.

## 2017-06-12 LAB — TSH: TSH: 3.4 u[IU]/mL (ref 0.35–4.50)

## 2017-06-14 ENCOUNTER — Encounter: Payer: Self-pay | Admitting: Family Medicine

## 2017-06-25 ENCOUNTER — Ambulatory Visit: Payer: 59 | Admitting: Psychiatry

## 2017-07-18 ENCOUNTER — Telehealth: Payer: Self-pay | Admitting: Family Medicine

## 2017-07-18 NOTE — Telephone Encounter (Signed)
Pt dropped off disability papers that need to be completed by Dr. [name]. Papers are up front Dr. Ellen Henri folder. Please call pt when ready to pick up, DO not mail or fax.

## 2017-07-23 NOTE — Telephone Encounter (Signed)
I did not see these papers. Please see if they are in your pile today.

## 2017-07-23 NOTE — Telephone Encounter (Signed)
In red folder. 

## 2017-07-29 NOTE — Telephone Encounter (Signed)
I have completed the additional form.  Please make this available for the patient to pick up.  We need to include her office visit note from 03/16/2017.  Thanks.

## 2017-07-30 NOTE — Telephone Encounter (Signed)
Tried calling, no voicemail. [place] for pec to inform patient forms are ready and placed at front desk

## 2017-08-10 ENCOUNTER — Other Ambulatory Visit: Payer: Self-pay

## 2017-08-10 DIAGNOSIS — D500 Iron deficiency anemia secondary to blood loss (chronic): Secondary | ICD-10-CM

## 2017-08-12 NOTE — Progress Notes (Deleted)
Dana Dunlap  [phone] [phone]  ID: Dana Dunlap OB: April 05, 1970  MR#: [id number]  AOZ#:[id number]  Patient Care Team: Leone Haven, MD as PCP - General (Family Medicine)  CHIEF COMPLAINT: Thrombocytosis, iron deficiency anemia.  INTERVAL HISTORY: Patient returns to clinic today for repeat laboratory work, further evaluation, and consideration of IV Feraheme. She continues to have heavy menses, but was unable to make her gynecology appointment secondary to personal issues. She continues to have persistent weakness and fatigue. She has no neurologic complaints. She has no chest pain or shortness of breath. She denies any nausea, vomiting, constipation, or diarrhea. She has no melena or hematochezia. She has no urinary complaints. Patient offers no further specific complaints today.  REVIEW OF SYSTEMS:   Review of Systems  Constitutional: Positive for malaise/fatigue. Negative for fever and weight loss.  HENT: Negative.  Negative for congestion and sore throat.   Respiratory: Negative.  Negative for cough and shortness of breath.   Cardiovascular: Negative.  Negative for chest pain and leg swelling.  Gastrointestinal: Negative.  Negative for abdominal pain and blood in stool.  Genitourinary: Negative.   Musculoskeletal: Positive for joint pain.  Skin: Negative.  Negative for rash.  Neurological: Positive for weakness.  Psychiatric/Behavioral: Negative for depression. The patient is not nervous/anxious.     As per HPI. Otherwise, a complete review of systems is negative.  PAST MEDICAL HISTORY: Past Medical History:  Diagnosis Date  . Depression    Currently on Cymbalta  . Fibroids   . Frequent headaches    Worse during monthly menstrual cycle  . Hypertension     PAST SURGICAL HISTORY: Past Surgical History:  Procedure Laterality Date  . BREAST MASS EXCISION     benign    FAMILY HISTORY: Family History  Problem Relation Age  of Onset  . Hypertension Mother   . Hyperlipidemia Mother   . Cancer Father        Prostate cancer  . Hypertension Father   . Hyperlipidemia Father   . Stroke Maternal Aunt   . Stroke Paternal Aunt   . Diabetes Paternal Aunt     ADVANCED DIRECTIVES (Y/N):  N  HEALTH MAINTENANCE: Social History   Tobacco Use  . Smoking status: Never Smoker  . Smokeless tobacco: Never Used  Substance Use Topics  . Alcohol use: Yes    Alcohol/week: 0.6 oz    Types: 1 Glasses of wine per week    Comment: occasional alcohol use  . Drug use: No     Colonoscopy:  PAP:  Bone density:  Lipid panel:  Allergies  Allergen Reactions  . Compazine [Prochlorperazine Edisylate] Other (See Comments)    Seizure     Current Outpatient Medications  Medication Sig Dispense Refill  . buPROPion (WELLBUTRIN) 75 MG tablet Take 1 tablet (75 mg total) by mouth every morning. 30 tablet 1  . clonazePAM (KLONOPIN) 0.5 MG tablet Take 1 tablet (0.5 mg total) by mouth 2 (two) times daily as needed for anxiety. 60 tablet 0  . DULoxetine (CYMBALTA) 60 MG capsule Take 1 capsule (60 mg total) by mouth daily. 90 capsule 1  . ferrous sulfate (FEOSOL) 325 (65 FE) MG tablet Take 1 tablet (325 mg total) by mouth 2 (two) times daily with a meal. 60 tablet 3  . hydrOXYzine (VISTARIL) 25 MG capsule Take 1 capsule (25 mg total) by mouth 3 (three) times daily as needed for anxiety (and for sleep at bedtime). 90 capsule 1  No current facility-administered medications for this visit.     OBJECTIVE: There were no vitals filed for this visit.   There is no height or weight on file to calculate BMI.    ECOG FS:0 - Asymptomatic  General: Well-developed, well-nourished, no acute distress. Eyes: Pink conjunctiva, anicteric sclera. Lungs: Clear to auscultation bilaterally. Heart: Regular rate and rhythm. No rubs, murmurs, or gallops. Abdomen: Soft, nontender, nondistended. No organomegaly noted, normoactive bowel  sounds. Musculoskeletal: No edema, cyanosis, or clubbing. Neuro: Alert, answering all questions appropriately. Cranial nerves grossly intact. Skin: No rashes or petechiae noted. Psych: Normal affect.   LAB RESULTS:  Lab Results  Component Value Date   NA 138 06/11/2017   K 3.8 06/11/2017   CL 107 06/11/2017   CO2 26 06/11/2017   GLUCOSE 88 06/11/2017   BUN 6 06/11/2017   CREATININE 0.85 06/11/2017   CALCIUM 9.2 06/11/2017   PROT 6.8 06/11/2017   ALBUMIN 3.7 06/11/2017   AST 13 06/11/2017   ALT 9 06/11/2017   ALKPHOS 36 (L) 06/11/2017   BILITOT 0.3 06/11/2017   GFRNONAA >60 03/22/2016   GFRAA >60 03/22/2016    Lab Results  Component Value Date   WBC 3.0 (L) 05/08/2017   NEUTROABS 1.7 05/08/2017   HGB 9.2 (L) 05/08/2017   HCT 30.1 (L) 05/08/2017   MCV 66.5 (L) 05/08/2017   PLT 470 (H) 05/08/2017   Lab Results  Component Value Date   IRON 14 (L) 05/08/2017   TIBC 322 05/08/2017   IRONPCTSAT 4 (L) 05/08/2017   Lab Results  Component Value Date   FERRITIN 10 (L) 05/08/2017     STUDIES: No results found.  ASSESSMENT: Thrombocytosis, iron deficiency anemia  PLAN:    1. Thrombocytosis: Mild. Likely reactive from active bleeding and severe iron deficiency anemia. JAK-2 mutation is negative. No further intervention is needed.  2. Iron deficiency anemia: Secondary to heavy fibroid bleeding. Despite recommendations, patient has refused hysterectomy. She has had multiple appointments with gynecology for evaluation, but has missed or canceled much of these appointments.  Patient has agreed to reschedule in the near future.  Patient's hemoglobin and iron stores continue to be decreased. Previously, the remainder her laboratory work including hemoglobinopathy profile was either negative or within normal limits. Proceed with 510 mg IV Feraheme today. Return to clinic in 1 week for second infusion and then in 3 months with repeat laboratory work and further evaluation. 3.  Fibroids: Continue follow-up per gynecology as above. 4.  Leukopenia: Mild, monitor.  Approximately 30 minutes was spent in discussion of which greater than 50% was consultation.  Patient expressed understanding and was in agreement with this plan. She also understands that She can call clinic at any time with any questions, concerns, or complaints.    Lloyd Huger, MD   08/12/2017 10:31 PM

## 2017-08-13 ENCOUNTER — Inpatient Hospital Stay: Payer: 59

## 2017-08-13 ENCOUNTER — Inpatient Hospital Stay: Payer: 59 | Admitting: Oncology

## 2017-08-29 ENCOUNTER — Other Ambulatory Visit: Payer: Self-pay | Admitting: Psychiatry

## 2017-08-29 DIAGNOSIS — F331 Major depressive disorder, recurrent, moderate: Secondary | ICD-10-CM

## 2017-09-24 ENCOUNTER — Other Ambulatory Visit: Payer: Self-pay | Admitting: Psychiatry

## 2017-09-24 DIAGNOSIS — F411 Generalized anxiety disorder: Secondary | ICD-10-CM

## 2017-09-24 DIAGNOSIS — F331 Major depressive disorder, recurrent, moderate: Secondary | ICD-10-CM

## 2017-11-04 ENCOUNTER — Other Ambulatory Visit: Payer: Self-pay | Admitting: Psychiatry

## 2017-11-04 DIAGNOSIS — F331 Major depressive disorder, recurrent, moderate: Secondary | ICD-10-CM

## 2018-01-14 ENCOUNTER — Other Ambulatory Visit: Payer: Self-pay | Admitting: Psychiatry

## 2018-01-14 ENCOUNTER — Other Ambulatory Visit: Payer: Self-pay | Admitting: Family Medicine

## 2018-01-14 DIAGNOSIS — F331 Major depressive disorder, recurrent, moderate: Secondary | ICD-10-CM

## 2018-01-16 NOTE — Telephone Encounter (Signed)
Last OV 06/11/2017   Last refilled 05/28/2017 disp 90 with 1 refill   Sent to PCP for approval

## 2018-01-17 NOTE — Telephone Encounter (Signed)
Please contact the patient and confirm her Cymbalta dose.  It appears she is supposed to be on 60 mg though they requested 30 mg.  She also needs to schedule follow-up.  Thanks.

## 2018-01-22 ENCOUNTER — Encounter: Payer: Self-pay | Admitting: Family Medicine

## 2018-01-22 ENCOUNTER — Ambulatory Visit: Payer: 59 | Admitting: Family Medicine

## 2018-01-22 VITALS — BP 132/74 | HR 69 | Temp 98.7°F | Ht 60.0 in | Wt 159.2 lb

## 2018-01-22 DIAGNOSIS — F331 Major depressive disorder, recurrent, moderate: Secondary | ICD-10-CM | POA: Diagnosis not present

## 2018-01-22 DIAGNOSIS — J069 Acute upper respiratory infection, unspecified: Secondary | ICD-10-CM | POA: Diagnosis not present

## 2018-01-22 DIAGNOSIS — R05 Cough: Secondary | ICD-10-CM

## 2018-01-22 DIAGNOSIS — D500 Iron deficiency anemia secondary to blood loss (chronic): Secondary | ICD-10-CM | POA: Diagnosis not present

## 2018-01-22 DIAGNOSIS — R059 Cough, unspecified: Secondary | ICD-10-CM

## 2018-01-22 MED ORDER — BENZONATATE 100 MG PO CAPS: 100.0000 mg | ORAL_CAPSULE | Freq: Three times a day (TID) | ORAL | 0 refills | Status: DC | PRN

## 2018-01-22 MED ORDER — FERROUS SULFATE 325 (65 FE) MG PO TABS: 325.0000 mg | ORAL_TABLET | Freq: Two times a day (BID) | ORAL | 3 refills | Status: DC

## 2018-01-22 MED ORDER — ALBUTEROL SULFATE HFA 108 (90 BASE) MCG/ACT IN AERS: 2.0000 | INHALATION_SPRAY | Freq: Four times a day (QID) | RESPIRATORY_TRACT | 2 refills | Status: DC | PRN

## 2018-01-22 MED ORDER — DULOXETINE HCL 60 MG PO CPEP: 60.0000 mg | ORAL_CAPSULE | Freq: Every day | ORAL | 1 refills | Status: DC

## 2018-01-22 NOTE — Progress Notes (Signed)
Subjective:    Patient ID: Dana Dunlap, female    DOB: 03/12/1971, 47 y.o.   MRN: [id number]  HPI   Patient presents to clinic from reasons.  First reason is she has had a cough, congestion and sore throat off and on for the last week.  States today she is finally starting to feel better.  Cough is slowly improving, she was coughing up a little bit of yellow phlegm at first, but now it is a dry cough.  The beginning of the illness, she did have some hot and cold sweats, but no longer has been having any fever or chills.  Denies feeling short of breath, does note some wheezing at times especially when in bed.  Patient also requesting refill of her Cymbalta, patient states her mood feels very good on this medication and it needs refill sent back to her mail order service.  Patient denies any suicidal or homicidal ideation.  Patient also has long-standing history of iron deficiency anemia related to bleeding uterine fibroids.  Patient had been getting iron infusions via hematology back in spring 2019, but has stopped going for her infusions and has stopped seeing hematology over the past 5 months.  Patient has not had lab work checked since May 2019 so she is unsure what her levels are.  Patient states she hates going to the hospital, hates going to get infusions and would prefer not to do this.  Patient states even if her hemoglobin is at 5, she will not go to the hospital but she would agree to see a hematologist again.  Patient Active Problem List   Diagnosis Date Noted  . Right knee pain 01/09/2017  . Hyperlipidemia 06/27/2016  . Thrombocytosis ([place]) 03/22/2016  . Anxiety and depression 03/21/2016  . Excessive and frequent menstruation 08/21/2013  . Fibroid, uterine 07/28/2013  . Anemia, iron deficiency 07/28/2013  . HTN (hypertension) 03/26/2013   Social History   Tobacco Use  . Smoking status: Never Smoker  . Smokeless tobacco: Never Used  Substance Use Topics  . Alcohol use: Yes      Alcohol/week: 1.0 standard drinks    Types: 1 Glasses of wine per week    Comment: occasional alcohol use   Review of Systems   Constitutional: +fever/chills (seem better), +fatigue HENT: Negative for congestion, ear pain, sinus pain and sore throat.   Eyes: Negative.   Respiratory: +cough. Negative for shortness of breath and wheezing.   Cardiovascular: Negative for chest pain, palpitations and leg swelling.  Gastrointestinal: Negative for abdominal pain, diarrhea, nausea and vomiting.  Genitourinary: Negative for dysuria, frequency and urgency.  Musculoskeletal: Negative for arthralgias and myalgias.  Skin: Negative for color change, pallor and rash.  Neurological: Negative for syncope, light-headedness and headaches.  Psychiatric/Behavioral: The patient is not nervous/anxious.       Objective:   Physical Exam  Constitutional: She is oriented to person, place, and time. No distress.  HENT:  Head: Normocephalic and atraumatic.  Eyes: Conjunctivae and EOM are normal. No scleral icterus.  Neck: Neck supple. No tracheal deviation present.  Cardiovascular: Normal rate, regular rhythm and normal heart sounds.  Pulmonary/Chest: Effort normal and breath sounds normal. No stridor. No respiratory distress. She has no wheezes. She has no rales.  Abdominal: Soft. Bowel sounds are normal. There is no tenderness.  Musculoskeletal: Normal range of motion. She exhibits no edema.  Neurological: She is alert and oriented to person, place, and time.  Skin: Skin is warm and dry. She  is not diaphoretic. No pallor.  Psychiatric: She has a normal mood and affect. Her behavior is normal.  Nursing note and vitals reviewed.     Vitals:   01/22/18 1633  BP: 132/74  Pulse: 69  Temp: 98.7 F (37.1 C)  SpO2: 100%   Assessment & Plan:   Iron deficiency anemia due to chronic blood loss - we will get new CBC, and iron studies.  I made patient aware that if her hemoglobin is less than 8 then I we  will recommend she go to emergency room for transfusion, patient states she understands that I have to recommend this, but she will not go.  Patient refuses to have to go to hospital for infusions, states she is sick of doing this.  Patient states she would be agreeable to see a hematologist again through [facility] and also would like a new referral to see a hematologist through [facility] for second opinion.  I discussed with patient that not getting a blood transfusion and iron transfusions as needed could result in severe illness, multiple organ failure and even death, patient states she understands these risks. Iron tablets refilled.  Major depressive disorder - Cymbalta refilled for patient.  Patient feels stable as medication.  She also follows with psychiatry.  Viral upper respiratory infection/cough - patient's cough, and cold symptoms have started to slowly resolve on their own.  Patient given Ladona Ridgel to use as needed for cough and also inhaler to use as needed for any feelings of shortness of breath or wheezing.  Results of patient's lab work will determine next step in our plan of care.  Patient given new referral for hematologist at [facility], also encouraged her to reconsider her stance on getting blood transfusion and iron infusions.

## 2018-01-23 ENCOUNTER — Other Ambulatory Visit: Payer: Self-pay

## 2018-01-23 ENCOUNTER — Emergency Department: Payer: 59

## 2018-01-23 ENCOUNTER — Observation Stay
Admission: EM | Admit: 2018-01-23 | Discharge: 2018-01-24 | Disposition: A | Discharge status: Home / Self Care | Payer: 59 | Attending: Obstetrics & Gynecology | Admitting: Obstetrics & Gynecology

## 2018-01-23 ENCOUNTER — Encounter: Payer: Self-pay | Admitting: Emergency Medicine

## 2018-01-23 ENCOUNTER — Observation Stay: Payer: 59

## 2018-01-23 ENCOUNTER — Encounter: Payer: Self-pay | Admitting: Family Medicine

## 2018-01-23 ENCOUNTER — Telehealth: Payer: Self-pay | Admitting: Internal Medicine

## 2018-01-23 DIAGNOSIS — I10 Essential (primary) hypertension: Secondary | ICD-10-CM | POA: Insufficient documentation

## 2018-01-23 DIAGNOSIS — D259 Leiomyoma of uterus, unspecified: Secondary | ICD-10-CM | POA: Diagnosis present

## 2018-01-23 DIAGNOSIS — F329 Major depressive disorder, single episode, unspecified: Secondary | ICD-10-CM | POA: Diagnosis not present

## 2018-01-23 DIAGNOSIS — N920 Excessive and frequent menstruation with regular cycle: Secondary | ICD-10-CM | POA: Diagnosis not present

## 2018-01-23 DIAGNOSIS — Z79899 Other long term (current) drug therapy: Secondary | ICD-10-CM | POA: Diagnosis not present

## 2018-01-23 DIAGNOSIS — D500 Iron deficiency anemia secondary to blood loss (chronic): Principal | ICD-10-CM | POA: Insufficient documentation

## 2018-01-23 DIAGNOSIS — D509 Iron deficiency anemia, unspecified: Secondary | ICD-10-CM

## 2018-01-23 DIAGNOSIS — D649 Anemia, unspecified: Secondary | ICD-10-CM | POA: Diagnosis present

## 2018-01-23 DIAGNOSIS — R42 Dizziness and giddiness: Secondary | ICD-10-CM | POA: Diagnosis not present

## 2018-01-23 DIAGNOSIS — N939 Abnormal uterine and vaginal bleeding, unspecified: Secondary | ICD-10-CM | POA: Diagnosis not present

## 2018-01-23 LAB — CBC WITH DIFFERENTIAL/PLATELET
Abs Immature Granulocytes: 0.01 10*3/uL (ref 0.00–0.07)
BASOS ABS: 0 10*3/uL (ref 0.0–0.2)
BASOS: 1 %
Basophils Absolute: 0 10*3/uL (ref 0.0–0.1)
Basophils Relative: 1 %
EOS (ABSOLUTE): 0.1 10*3/uL (ref 0.0–0.4)
Eos: 2 %
Eosinophils Absolute: 0.1 10*3/uL (ref 0.0–0.5)
Eosinophils Relative: 3 %
HCT: 25.1 % — ABNORMAL LOW (ref 36.0–46.0)
HEMATOCRIT: 23.7 % — AB (ref 34.0–46.6)
HEMOGLOBIN: 6.3 g/dL — AB (ref 12.0–15.0)
Hemoglobin: 6.1 g/dL — CL (ref 11.1–15.9)
IMMATURE GRANS (ABS): 0 10*3/uL (ref 0.0–0.1)
IMMATURE GRANULOCYTES: 0 %
Immature Granulocytes: 0 %
LYMPHS ABS: 0.9 10*3/uL (ref 0.7–4.0)
LYMPHS: 25 %
Lymphocytes Absolute: 1.2 10*3/uL (ref 0.7–3.1)
Lymphocytes Relative: 25 %
MCH: 16.3 pg — ABNORMAL LOW (ref 26.6–33.0)
MCH: 16.4 pg — AB (ref 26.0–34.0)
MCHC: 25.7 g/dL — AB (ref 31.5–35.7)
MCHC: 25.1 g/dL — ABNORMAL LOW (ref 30.0–36.0)
MCV: 63 fL — ABNORMAL LOW (ref 79–97)
MCV: 65.2 fL — AB (ref 80.0–100.0)
MONOS ABS: 0.6 10*3/uL (ref 0.1–0.9)
Monocytes Absolute: 0.4 10*3/uL (ref 0.1–1.0)
Monocytes Relative: 12 %
Monocytes: 13 %
NEUTROS PCT: 59 %
NRBC: 0 % (ref 0.0–0.2)
Neutro Abs: 2.1 10*3/uL (ref 1.7–7.7)
Neutrophils Absolute: 2.9 10*3/uL (ref 1.4–7.0)
Neutrophils Relative %: 59 %
PLATELETS: 883 10*3/uL — AB (ref 150–450)
PLATELETS: 848 10*3/uL — AB (ref 150–400)
RBC: 3.75 x10E6/uL — AB (ref 3.77–5.28)
RBC: 3.85 MIL/uL — AB (ref 3.87–5.11)
RDW: 27.5 % — AB (ref 12.3–15.4)
RDW: 28.8 % — ABNORMAL HIGH (ref 11.5–15.5)
Smear Review: NORMAL
WBC: 4.9 10*3/uL (ref 3.4–10.8)
WBC: 3.5 10*3/uL — AB (ref 4.0–10.5)

## 2018-01-23 LAB — INFLUENZA PANEL BY PCR (TYPE A & B)
Influenza A By PCR: NEGATIVE
Influenza B By PCR: NEGATIVE

## 2018-01-23 LAB — COMPREHENSIVE METABOLIC PANEL
ALK PHOS: 44 U/L (ref 38–126)
ALT: 13 U/L (ref 0–44)
AST: 16 U/L (ref 15–41)
Albumin: 3.8 g/dL (ref 3.5–5.0)
Anion gap: 8 (ref 5–15)
BUN: 10 mg/dL (ref 6–20)
CO2: 21 mmol/L — ABNORMAL LOW (ref 22–32)
CREATININE: 0.87 mg/dL (ref 0.44–1.00)
Calcium: 8.9 mg/dL (ref 8.9–10.3)
Chloride: 111 mmol/L (ref 98–111)
Glucose, Bld: 97 mg/dL (ref 70–99)
Potassium: 3.8 mmol/L (ref 3.5–5.1)
Sodium: 140 mmol/L (ref 135–145)
Total Bilirubin: 0.5 mg/dL (ref 0.3–1.2)
Total Protein: 7.4 g/dL (ref 6.5–8.1)

## 2018-01-23 LAB — HEMOGLOBIN AND HEMATOCRIT, BLOOD
HCT: 33.1 % — ABNORMAL LOW (ref 36.0–46.0)
Hemoglobin: 9.3 g/dL — ABNORMAL LOW (ref 12.0–15.0)

## 2018-01-23 LAB — IRON,TIBC AND FERRITIN PANEL
Ferritin: 9 ng/mL — ABNORMAL LOW (ref 15–150)
IRON: 7 ug/dL — AB (ref 27–159)
Iron Saturation: 2 % — CL (ref 15–55)
TIBC: 365 ug/dL (ref 250–450)
UIBC: 358 ug/dL (ref 131–425)

## 2018-01-23 LAB — PREGNANCY, URINE: Preg Test, Ur: NEGATIVE

## 2018-01-23 LAB — PREPARE RBC (CROSSMATCH)

## 2018-01-23 MED ORDER — DULOXETINE HCL 60 MG PO CPEP
60.0000 mg | ORAL_CAPSULE | Freq: Every day | ORAL | Status: DC
  Administered 2018-01-23: 60 mg via ORAL
  Filled 2018-01-23 (×2): qty 1

## 2018-01-23 MED ORDER — SENNOSIDES-DOCUSATE SODIUM 8.6-50 MG PO TABS: 1.0000 | ORAL_TABLET | Freq: Every evening | ORAL | Status: DC | PRN

## 2018-01-23 MED ORDER — BUPROPION HCL 75 MG PO TABS
75.0000 mg | ORAL_TABLET | Freq: Every morning | ORAL | Status: DC
Start: 2018-01-23 — End: 2018-01-24
  Administered 2018-01-23: 75 mg via ORAL
  Filled 2018-01-23 (×2): qty 1

## 2018-01-23 MED ORDER — FERROUS SULFATE 325 (65 FE) MG PO TABS
325.0000 mg | ORAL_TABLET | Freq: Two times a day (BID) | ORAL | Status: DC
  Administered 2018-01-23 – 2018-01-24 (×2): 325 mg via ORAL
  Filled 2018-01-23 (×2): qty 1

## 2018-01-23 MED ORDER — CLONAZEPAM 0.5 MG PO TABS: 0.5000 mg | ORAL_TABLET | Freq: Two times a day (BID) | ORAL | Status: DC | PRN

## 2018-01-23 MED ORDER — FUROSEMIDE 10 MG/ML IJ SOLN
20.0000 mg | Freq: Once | INTRAMUSCULAR | Status: AC
  Administered 2018-01-23: 20 mg via INTRAVENOUS
  Filled 2018-01-23: qty 2

## 2018-01-23 MED ORDER — HYDROXYZINE HCL 25 MG PO TABS
25.0000 mg | ORAL_TABLET | Freq: Every evening | ORAL | Status: DC | PRN
  Administered 2018-01-23: 25 mg via ORAL
  Filled 2018-01-23: qty 1

## 2018-01-23 MED ORDER — ACETAMINOPHEN 325 MG PO TABS: 650.0000 mg | ORAL_TABLET | ORAL | Status: DC | PRN

## 2018-01-23 MED ORDER — LACTATED RINGERS IV SOLN
INTRAVENOUS | Status: DC
  Administered 2018-01-23: 22:00:00 via INTRAVENOUS

## 2018-01-23 MED ORDER — SODIUM CHLORIDE 0.9% IV SOLUTION
Freq: Once | INTRAVENOUS | Status: AC
  Administered 2018-01-23: 15:00:00 via INTRAVENOUS
  Filled 2018-01-23: qty 250

## 2018-01-23 MED ORDER — ACETAMINOPHEN 325 MG PO TABS
650.0000 mg | ORAL_TABLET | Freq: Once | ORAL | Status: AC
  Administered 2018-01-23: 650 mg via ORAL
  Filled 2018-01-23: qty 2

## 2018-01-23 MED ORDER — ONDANSETRON HCL 4 MG/2ML IJ SOLN: 4.0000 mg | Freq: Four times a day (QID) | INTRAMUSCULAR | Status: DC | PRN

## 2018-01-23 MED ORDER — DIPHENHYDRAMINE HCL 25 MG PO CAPS
25.0000 mg | ORAL_CAPSULE | Freq: Once | ORAL | Status: AC
  Administered 2018-01-23: 25 mg via ORAL
  Filled 2018-01-23: qty 1

## 2018-01-23 MED ORDER — SIMETHICONE 80 MG PO CHEW: 80.0000 mg | CHEWABLE_TABLET | Freq: Four times a day (QID) | ORAL | Status: DC | PRN

## 2018-01-23 MED ORDER — ALBUTEROL SULFATE (2.5 MG/3ML) 0.083% IN NEBU
2.5000 mg | INHALATION_SOLUTION | Freq: Four times a day (QID) | RESPIRATORY_TRACT | Status: DC | PRN
  Administered 2018-01-23: 2.5 mg via RESPIRATORY_TRACT
  Filled 2018-01-23: qty 3

## 2018-01-23 MED ORDER — ONDANSETRON HCL 4 MG PO TABS: 4.0000 mg | ORAL_TABLET | Freq: Four times a day (QID) | ORAL | Status: DC | PRN

## 2018-01-23 MED ORDER — BENZONATATE 100 MG PO CAPS
100.0000 mg | ORAL_CAPSULE | Freq: Three times a day (TID) | ORAL | Status: DC | PRN
  Administered 2018-01-23: 100 mg via ORAL
  Filled 2018-01-23 (×2): qty 1

## 2018-01-23 NOTE — ED Provider Notes (Addendum)
[facility] Emergency Department Provider Note   ____________________________________________   First MD Initiated Contact with Patient 01/23/18 1122     (approximate)  I have reviewed the triage vital signs and the nursing notes.   HISTORY  Chief Complaint Anemia    HPI Dana Dunlap is a 47 y.o. female who reports she has been having heavy menstrual periods and has thrombocytopenia.  She went to see her doctor yesterday because she had a sore throat last week and that developed into a cough productive of small amounts of yellow phlegm aching all over and some fever.  Dr. did a blood test which showed she was anemic.  Her platelet count is higher than usual at 883,000 yesterday.  Patient had been to see a gynecologist in [place] and wanted a myomectomy and had an appointment to follow-up with a surgical center in [place] to get a robotic myomectomy but that fell through some how.  Patient reports because of this she has no faith in medicine.   Past Medical History:  Diagnosis Date  . Depression    Currently on Cymbalta  . Fibroids   . Frequent headaches    Worse during monthly menstrual cycle  . Hypertension     Patient Active Problem List   Diagnosis Date Noted  . Right knee pain 01/09/2017  . Hyperlipidemia 06/27/2016  . Thrombocytosis ([place]) 03/22/2016  . Anxiety and depression 03/21/2016  . Excessive and frequent menstruation 08/21/2013  . Fibroid, uterine 07/28/2013  . Anemia, iron deficiency 07/28/2013  . HTN (hypertension) 03/26/2013    Past Surgical History:  Procedure Laterality Date  . BREAST MASS EXCISION     benign    Prior to Admission medications   Medication Sig Start Date End Date Taking? Authorizing Provider  albuterol (PROVENTIL HFA;VENTOLIN HFA) 108 (90 Base) MCG/ACT inhaler Inhale 2 puffs into the lungs every 6 (six) hours as needed for wheezing or shortness of breath. 01/22/18  Yes Guse, Jacquelynn Cree, FNP  benzonatate  (TESSALON) 100 MG capsule Take 1 capsule (100 mg total) by mouth 3 (three) times daily as needed. 01/22/18  Yes Guse, Jacquelynn Cree, FNP  buPROPion (WELLBUTRIN) 75 MG tablet TAKE ONE TABLET BY MOUTH EVERY MORNING 01/14/18  Yes Eappen, Ria Clock, MD  clonazePAM (KLONOPIN) 0.5 MG tablet Take 1 tablet (0.5 mg total) by mouth 2 (two) times daily as needed for anxiety. 01/08/17  Yes Leone Haven, MD  DULoxetine (CYMBALTA) 60 MG capsule Take 1 capsule (60 mg total) by mouth daily. 01/22/18  Yes Guse, Jacquelynn Cree, FNP  ferrous sulfate (FEOSOL) 325 (65 FE) MG tablet Take 1 tablet (325 mg total) by mouth 2 (two) times daily with a meal. 01/22/18  Yes Guse, Jacquelynn Cree, FNP  hydrOXYzine (VISTARIL) 25 MG capsule Take 25 mg by mouth at bedtime as needed (sleep).   Yes [provider]    Allergies Compazine [prochlorperazine edisylate]  Family History  Problem Relation Age of Onset  . Hypertension Mother   . Hyperlipidemia Mother   . Cancer Father        Prostate cancer  . Hypertension Father   . Hyperlipidemia Father   . Stroke Maternal Aunt   . Stroke Paternal Aunt   . Diabetes Paternal Aunt     Social History Social History   Tobacco Use  . Smoking status: Never Smoker  . Smokeless tobacco: Never Used  Substance Use Topics  . Alcohol use: Yes    Alcohol/week: 1.0 standard drinks  Types: 1 Glasses of wine per week    Comment: occasional alcohol use  . Drug use: No    Review of Systems  Constitutional: No fever/chills at present Eyes: No visual changes. ENT: No sore throat. Cardiovascular: Denies chest pain. Respiratory: Denies shortness of breath. Gastrointestinal: No abdominal pain.  No nausea, no vomiting.  No diarrhea.  No constipation. Genitourinary: Negative for dysuria. Musculoskeletal: Negative for back pain. Skin: Negative for rash. Neurological: Negative for headaches, focal weakness or   ____________________________________________   PHYSICAL EXAM:  VITAL  SIGNS: ED Triage Vitals [01/23/18 1123]  Enc Vitals Group     BP (!) 113/93     Pulse Rate 81     Resp 16     Temp 98.4 F (36.9 C)     Temp Source Oral     SpO2 100 %     Weight      Height      Head Circumference      Peak Flow      Pain Score 0     Pain Loc      Pain Edu?      Excl. in [place]?     Constitutional: Alert and oriented. Well appearing and in no acute distress. Eyes: Conjunctivae are normal somewhat pale. PERRL. EOMI. Head: Atraumatic. Nose: No congestion/rhinnorhea. Mouth/Throat: Mucous membranes are moist.  Oropharynx non-erythematous. Neck: No stridor.   Cardiovascular: Normal rate, regular rhythm. Grossly normal heart sounds.  Good peripheral circulation. Respiratory: Normal respiratory effort.  No retractions. Lungs CTAB. Gastrointestinal: Soft and nontender. No distention. No abdominal bruits. No CVA tenderness. Musculoskeletal: No lower extremity tenderness nor edema.   Neurologic:  Normal speech and language. No gross focal neurologic deficits are appreciated. . Skin:  Skin is warm, dry and intact. No rash noted. Psychiatric: Mood and affect are normal. Speech and behavior are normal.  ____________________________________________   LABS (all labs ordered are listed, but only abnormal results are displayed)  Labs Reviewed  COMPREHENSIVE METABOLIC PANEL - Abnormal; Notable for the following components:      Result Value   CO2 21 (*)    All other components within normal limits  INFLUENZA PANEL BY PCR (TYPE A & B)  CBC WITH DIFFERENTIAL/PLATELET  TYPE AND SCREEN   ____________________________________________  EKG  EKG read interpreted by me shows normal sinus rhythm rate of 70 normal axis essentially normal EKG ____________________________________________  RADIOLOGY  ED MD interpretation: X-ray read by radiology reviewed by me is normal  Official radiology report(s): Dg Chest 2 View  Result Date: 01/23/2018 CLINICAL DATA:  Anemia with  dizziness EXAM: CHEST - 2 VIEW COMPARISON:  None. FINDINGS: Lungs are clear. Heart size and pulmonary vascularity are normal. No adenopathy. There is lower thoracic dextroscoliosis. IMPRESSION: No edema or consolidation.  Heart size within normal limits. Electronically Signed   By: Lowella Grip III M.D.   On: 01/23/2018 12:30    ____________________________________________   PROCEDURES  Procedure(s) performed:   Procedures  Critical Care performed: Medical care time 45 minutes this includes discussing the myomectomy that was to have been done in [place] with the patient and options for decreasing the patient's menstrual bleeding and then discussing the transfusion with the patient and getting consent and discussing the patient with the hospitalist and then the gynecologist.  I also discussed the patient with Dr. Grayland Ormond in the cancer center.  He did not think that the transfusing the patient would cause any problems. ____________________________________________   INITIAL IMPRESSION / ASSESSMENT  AND PLAN / ED COURSE  Hospitalist declined admission.  They want me to call OB/GYN to admit the patient.  Discussed the patient with Dr. Kenton Kingfisher he will admit.  Old records seem to indicate that the thrombocytosis is due to iron deficiency anemia and not a problem but I will confirm this with Dr. Grayland Ormond.         ____________________________________________   FINAL CLINICAL IMPRESSION(S) / ED DIAGNOSES  Final diagnoses:  Symptomatic anemia     ED Discharge Orders    None       Note:  This document was prepared using Dragon voice recognition software and may include unintentional dictation errors.    Nena Polio, MD 01/23/18 1236    Nena Polio, MD 01/23/18 1255    Nena Polio, MD 01/29/18 1640

## 2018-01-23 NOTE — ED Notes (Signed)
Patient transported to X-ray 

## 2018-01-23 NOTE — ED Triage Notes (Signed)
Pt to ED via PCP with low hg of 6. Pt hx of anemia. PT states increased weakness and dizziness. Pt A&OX4, VSS

## 2018-01-23 NOTE — H&P (Signed)
Obstetrics & Gynecology History and Physical Note  Date of Consultation: 01/23/2018   Requesting Provider: [facility] ER  Primary OBGYN: None Primary Care Provider: Leone Haven  Reason for Consultation: Anemia, symptomatic, Known fibroids  History of Present Illness: Dana Dunlap is a 47 y.o. G1P0 (Patient's last menstrual period was 01/10/2018 (approximate).), with the above CC. She has reg cycles monthly with 7 days heavy and painful periods.  Oftne has low blood because of it with dizziness and weakness and fatigue.  Seen recently at PCP and again today with these symptoms andd noted anemia.  Has h/o anemia and thrombocytosis, sees Dr Sande Rives.  Has known fibroids for years, no treatment.  Pt does not want hysterectomy, nor [place] (sister had complications), and has consider myomectomy but has not had done as of yet.  ROS: A review of systems was performed and was complete and comprehensive, except as stated in the above HPI.  OBGYN History: As per HPI. OB History    Gravida  1   Para      Term      Preterm      AB      Living        SAB      TAB      Ectopic      Multiple      Live Births               Past Medical History: Past Medical History:  Diagnosis Date  . Depression    Currently on Cymbalta  . Fibroids   . Frequent headaches    Worse during monthly menstrual cycle  . Hypertension     Past Surgical History: Past Surgical History:  Procedure Laterality Date  . BREAST MASS EXCISION     benign    Family History:  Family History  Problem Relation Age of Onset  . Hypertension Mother   . Hyperlipidemia Mother   . Cancer Father        Prostate cancer  . Hypertension Father   . Hyperlipidemia Father   . Stroke Maternal Aunt   . Stroke Paternal Aunt   . Diabetes Paternal Aunt    She denies any female cancers, bleeding or blood clotting disorders. POS FIBROIDS  Social History:  Social History   Socioeconomic History  . Marital status:  Single    Spouse name: Not on file  . Number of children: 0  . Years of education: 16  . Highest education level: Bachelor's degree (e.g., BA, AB, BS)  Occupational History  . Occupation: Psychologist, clinical: [employer]    Comment: Engineer, building services  Social Needs  . Financial resource strain: Not hard at all  . Food insecurity:    Worry: Never true    Inability: Never true  . Transportation needs:    Medical: No    Non-medical: No  Tobacco Use  . Smoking status: Never Smoker  . Smokeless tobacco: Never Used  Substance and Sexual Activity  . Alcohol use: Yes    Alcohol/week: 1.0 standard drinks    Types: 1 Glasses of wine per week    Comment: occasional alcohol use  . Drug use: No  . Sexual activity: Yes    Birth control/protection: Condom  Lifestyle  . Physical activity:    Days per week: 0 days    Minutes per session: 0 min  . Stress: Very much  Relationships  . Social connections:    Talks on phone: Once  a week    Gets together: Never    Attends religious service: Never    Active member of club or organization: No    Attends meetings of clubs or organizations: Never    Relationship status: Never married  . Intimate partner violence:    Fear of current or ex partner: No    Emotionally abused: No    Physically abused: No    Forced sexual activity: No  Other Topics Concern  . Not on file  Social History Narrative   Eron is originally from [place]. She attended [employer] in [place] where she obtained her Therapist, nutritional in Biology in 1995. 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 She is in a long term relationship with her boyfried, [name]. They have been together for 6 years. [name] transferred to [facility] with [place] in January. She enjoys reading and she enjoys the outdoors. She loves to travel. 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    Allergy: Allergies   Allergen Reactions  . Compazine [Prochlorperazine Edisylate] Other (See Comments)    Seizure     Current Outpatient Medications:  (Not in a hospital admission)  Hospital Medications: Current Facility-Administered Medications  Medication Dose Route Frequency Provider Last Rate Last Dose  . 0.9 %  sodium chloride infusion (Manually program via Guardrails IV Fluids)   Intravenous Once Gae Dry, MD      . acetaminophen (TYLENOL) tablet 650 mg  650 mg Oral Q4H PRN Gae Dry, MD      . acetaminophen (TYLENOL) tablet 650 mg  650 mg Oral Once Gae Dry, MD      . albuterol (PROVENTIL HFA;VENTOLIN HFA) 108 (90 Base) MCG/ACT inhaler 2 puff  2 puff Inhalation Q6H PRN Gae Dry, MD      . benzonatate (TESSALON) capsule 100 mg  100 mg Oral TID PRN Gae Dry, MD      . buPROPion [facility]) tablet 75 mg  75 mg Oral q morning - 10a Gae Dry, MD      . clonazePAM [facility]) tablet 0.5 mg  0.5 mg Oral BID PRN Gae Dry, MD      . diphenhydrAMINE (BENADRYL) capsule 25 mg  25 mg Oral Once Gae Dry, MD      . DULoxetine (CYMBALTA) DR capsule 60 mg  60 mg Oral Daily Gae Dry, MD      . ferrous sulfate tablet 325 mg  325 mg Oral BID WC Gae Dry, MD      . furosemide (LASIX) injection 20 mg  20 mg Intravenous Once Gae Dry, MD      . hydrOXYzine (VISTARIL) capsule 25 mg  25 mg Oral QHS PRN Gae Dry, MD      . lactated ringers infusion   Intravenous Continuous Gae Dry, MD      . ondansetron [facility]) tablet 4 mg  4 mg Oral Q6H PRN Gae Dry, MD       Or  . ondansetron [facility]) injection 4 mg  4 mg Intravenous Q6H PRN Gae Dry, MD      . senna-docusate (Senokot-S) tablet 1 tablet  1 tablet Oral QHS PRN Gae Dry, MD      . simethicone [facility]) chewable tablet 80 mg  80 mg Oral QID PRN Gae Dry, MD  Current Outpatient Medications  Medication Sig Dispense Refill  .  albuterol (PROVENTIL HFA;VENTOLIN HFA) 108 (90 Base) MCG/ACT inhaler Inhale 2 puffs into the lungs every 6 (six) hours as needed for wheezing or shortness of breath. 1 Inhaler 2  . benzonatate (TESSALON) 100 MG capsule Take 1 capsule (100 mg total) by mouth 3 (three) times daily as needed. 30 capsule 0  . buPROPion (WELLBUTRIN) 75 MG tablet TAKE ONE TABLET BY MOUTH EVERY MORNING 30 tablet 0  . clonazePAM (KLONOPIN) 0.5 MG tablet Take 1 tablet (0.5 mg total) by mouth 2 (two) times daily as needed for anxiety. 60 tablet 0  . DULoxetine (CYMBALTA) 60 MG capsule Take 1 capsule (60 mg total) by mouth daily. 90 capsule 1  . ferrous sulfate (FEOSOL) 325 (65 FE) MG tablet Take 1 tablet (325 mg total) by mouth 2 (two) times daily with a meal. 180 tablet 3  . hydrOXYzine (VISTARIL) 25 MG capsule Take 25 mg by mouth at bedtime as needed (sleep).      Physical Exam: Vitals:   01/23/18 1123  BP: (!) 113/93  Pulse: 81  Resp: 16  Temp: 98.4 F (36.9 C)  TempSrc: Oral  SpO2: 100%    Temp:  [98.4 F (36.9 C)-98.7 F (37.1 C)] 98.4 F (36.9 C) (10/23 1123) Pulse Rate:  [69-81] 81 (10/23 1123) Resp:  [16] 16 (10/23 1123) BP: (113-132)/(74-93) 113/93 (10/23 1123) SpO2:  [100 %] 100 % (10/23 1123) Weight:  [72.2 kg] 72.2 kg (10/22 1633) No intake/output data recorded. No intake/output data recorded. No intake or output data in the 24 hours ending 01/23/18 1342  There is no height or weight on file to calculate BMI. Constitutional: Well nourished, well developed female in no acute distress.  HEENT: normal Neck:  Supple, normal appearance, and no thyromegaly  Cardiovascular:Regular rate and rhythm.  No murmurs, rubs or gallops. Respiratory:  Clear to auscultation bilateral. Normal respiratory effort Abdomen: positive bowel sounds and no masses, hernias; diffusely non tender to palpation, non distended; FIBROIDS MASSES palpated to level of umbilicus Neuro: grossly intact Psych:  Normal mood and  affect.  Skin:  Warm and dry.  MS: normal gait and normal bilateral lower extremity strength/ROM/symmetry Lymphatic:  No inguinal lymphadenopathy.   Recent Labs  Lab 01/22/18 1702 01/23/18 1136  WBC 4.9 3.5*  HGB 6.1* 6.3*  HCT 23.7* 25.1*  PLT 883* 848*   Recent Labs  Lab 01/23/18 1136  NA 140  K 3.8  CL 111  CO2 21*  BUN 10  CREATININE 0.87  CALCIUM 8.9  PROT 7.4  BILITOT 0.5  ALKPHOS 44  ALT 13  AST 16  GLUCOSE 97   No results for input(s): APTT, INR, PTT in the last 168 hours.  Invalid input(s): DRHAPTT Recent Labs  Lab 01/23/18 1136  [place] POS   Assessment: Ms. Groner is a 47 y.o. G1P0 (Patient's last menstrual period was 01/10/2018 (approximate).) who presented to the ED with complaints of symptomatic anemia; findings are consistent with fibroid uterus that may contribute to her anemia. 1. Symptomatic anemia from blood loss 2. Fibroid Uterus  Plan: Transfusion today, relieve anemia Consult Hematology for thrombocytosis Maintain meds, diet Follow up after discharge for long term planning of treatment of fibroids    Fibroid treatment such as [place], Lupron, Myomectomy, and Hysterectomy discussed in detail, with the pros and cons of each choice counseled.  No treatment as an option also discussed, as well as control of symptoms alone with hormone therapy.  Information provided to the patient.  Pt desires referral to [facility] or [facility] for myomectomy options.   Barnett Applebaum, MD, [facility], [facility] 01/23/2018  1:42 PM Pager [phone]

## 2018-01-23 NOTE — ED Notes (Signed)
Blood consent signed electronically. ?

## 2018-01-23 NOTE — ED Notes (Signed)
First Nurse Note:  Patient sent from [facility] for hemoglobin of 6.1 and hematocrit of 7.3.  Patient is tearful due to not wanting to come to the hospital.

## 2018-01-23 NOTE — Telephone Encounter (Signed)
Reviewed the chart; patient's anemia likely from heavy menstrual periods/iron deficiency.  Agree with blood transfusion.  We will plan IV iron tomorrow.  Thrombocytosis likely from reactive/iron deficiency.

## 2018-01-24 ENCOUNTER — Telehealth: Payer: Self-pay | Admitting: Internal Medicine

## 2018-01-24 DIAGNOSIS — D500 Iron deficiency anemia secondary to blood loss (chronic): Secondary | ICD-10-CM

## 2018-01-24 DIAGNOSIS — N920 Excessive and frequent menstruation with regular cycle: Secondary | ICD-10-CM | POA: Diagnosis not present

## 2018-01-24 DIAGNOSIS — D259 Leiomyoma of uterus, unspecified: Secondary | ICD-10-CM | POA: Diagnosis not present

## 2018-01-24 DIAGNOSIS — D696 Thrombocytopenia, unspecified: Secondary | ICD-10-CM

## 2018-01-24 DIAGNOSIS — D509 Iron deficiency anemia, unspecified: Secondary | ICD-10-CM | POA: Diagnosis not present

## 2018-01-24 LAB — TYPE AND SCREEN
ABO/RH(D): O POS
Antibody Screen: NEGATIVE
UNIT DIVISION: 0
Unit division: 0
Unit division: 0

## 2018-01-24 LAB — BPAM RBC
BLOOD PRODUCT EXPIRATION DATE: 201911182359
BLOOD PRODUCT EXPIRATION DATE: 201911182359
Blood Product Expiration Date: 201910242359
ISSUE DATE / TIME: 201910231511
ISSUE DATE / TIME: 201910231516
ISSUE DATE / TIME: 201910231818
UNIT TYPE AND RH: 9500
Unit Type and Rh: 5100
Unit Type and Rh: 5100

## 2018-01-24 NOTE — Discharge Summary (Signed)
Physician Discharge Summary  Patient ID: Dana Dunlap MRN: [id number] DOB/AGE: 07/23/1970 47 y.o.  Admit date: 01/23/2018 Discharge date: 01/24/2018  Admission Diagnoses: Fibroid, Menorrhagia, Anemia  Discharge Diagnoses:  Active Problems:   Fibroid, uterine   Anemia, iron deficiency   Excessive and frequent menstruation   Symptomatic anemia  Discharged Condition: good  Hospital Course: Pt seen with low anemia and symptoms related to this. She has known h/o fibroids and is planning intervention at some point in the future for these; desires [facility] referral.  She was admitted for ultrasound and transfusion of 2 U PRBCs, tolerated well with improvement in Hemoglobin.  Pt feels well on morning of discharge.  Consults: hematology/oncology  Significant Diagnostic Studies: labs:  Results for orders placed or performed during the hospital encounter of 01/23/18  Influenza panel by PCR (type A & B)  Result Value Ref Range   Influenza A By PCR NEGATIVE NEGATIVE   Influenza B By PCR NEGATIVE NEGATIVE  Comprehensive metabolic panel  Result Value Ref Range   Sodium 140 135 - 145 mmol/L   Potassium 3.8 3.5 - 5.1 mmol/L   Chloride 111 98 - 111 mmol/L   CO2 21 (L) 22 - 32 mmol/L   Glucose, Bld 97 70 - 99 mg/dL   BUN 10 6 - 20 mg/dL   Creatinine, Ser 0.87 0.44 - 1.00 mg/dL   Calcium 8.9 8.9 - 10.3 mg/dL   Total Protein 7.4 6.5 - 8.1 g/dL   Albumin 3.8 3.5 - 5.0 g/dL   AST 16 15 - 41 U/L   ALT 13 0 - 44 U/L   Alkaline Phosphatase 44 38 - 126 U/L   Total Bilirubin 0.5 0.3 - 1.2 mg/dL   GFR calc non Af Amer >60 >60 mL/min   GFR calc Af Amer >60 >60 mL/min   Anion gap 8 5 - 15  CBC with Differential  Result Value Ref Range   WBC 3.5 (L) 4.0 - 10.5 K/uL   RBC 3.85 (L) 3.87 - 5.11 MIL/uL   Hemoglobin 6.3 (L) 12.0 - 15.0 g/dL   HCT 25.1 (L) 36.0 - 46.0 %   MCV 65.2 (L) 80.0 - 100.0 fL   MCH 16.4 (L) 26.0 - 34.0 pg   MCHC 25.1 (L) 30.0 - 36.0 g/dL   RDW 28.8 (H) 11.5 - 15.5 %    Platelets 848 (H) 150 - 400 K/uL   nRBC 0.0 0.0 - 0.2 %   Neutrophils Relative % 59 %   Neutro Abs 2.1 1.7 - 7.7 K/uL   Lymphocytes Relative 25 %   Lymphs Abs 0.9 0.7 - 4.0 K/uL   Monocytes Relative 12 %   Monocytes Absolute 0.4 0.1 - 1.0 K/uL   Eosinophils Relative 3 %   Eosinophils Absolute 0.1 0.0 - 0.5 K/uL   Basophils Relative 1 %   Basophils Absolute 0.0 0.0 - 0.1 K/uL   RBC Morphology MICROCYTOSIS NOTED ON SMEAR    Smear Review Normal platelet morphology    Immature Granulocytes 0 %   Abs Immature Granulocytes 0.01 0.00 - 0.07 K/uL   Polychromasia PRESENT   Pregnancy, urine  Result Value Ref Range   Preg Test, Ur NEGATIVE NEGATIVE  Hemoglobin and hematocrit, blood  Result Value Ref Range   Hemoglobin 9.3 (L) 12.0 - 15.0 g/dL   HCT 33.1 (L) 36.0 - 46.0 %  Type and screen [facility]  Result Value Ref Range   ABO/RH(D) O POS    Antibody  Screen NEG    Sample Expiration 01/26/2018    Unit Number [id number]    Blood Component Type RED CELLS,LR    Unit division 00    Status of Unit ISSUED    Transfusion Status OK TO TRANSFUSE    Crossmatch Result      Compatible Performed at [facility], [address], [place], [place] [address]    Unit Number [id number]    Blood Component Type RBC, LR IRR    Unit division 00    Status of Unit REL FROM [facility]    Transfusion Status OK TO TRANSFUSE    Crossmatch Result Compatible    Unit Number [id number]    Blood Component Type RED CELLS,LR    Unit division 00    Status of Unit ISSUED    Transfusion Status OK TO TRANSFUSE    Crossmatch Result Compatible   Prepare RBC  Result Value Ref Range   Order Confirmation      ORDER PROCESSED BY BLOOD BANK Performed at [facility], [address] [place], [place] [address]   BPAM [facility]  Result Value Ref Range   ISSUE DATE / TIME [id number]    Blood Product Unit Number [id number]    PRODUCT CODE Y1017P10    Unit Type and Rh 5100     Blood Product Expiration Date 258527782423    ISSUE DATE / TIME [id number]    Blood Product Unit Number [id number]    PRODUCT CODE [id number]    Unit Type and Rh 9500    Blood Product Expiration Date 833825053976    ISSUE DATE / TIME [id number]    Blood Product Unit Number [id number]    PRODUCT CODE [id number]    Unit Type and Rh 5100    Blood Product Expiration Date 979892119417     and radiology: Ultrasound:  multiple fibroids EXAM: TRANSABDOMINAL AND TRANSVAGINAL ULTRASOUND OF PELVIS  TECHNIQUE: Both transabdominal and transvaginal ultrasound examinations of the pelvis were performed. Transabdominal technique was performed for global imaging of the pelvis including uterus, ovaries, adnexal regions, and pelvic cul-de-sac. It was necessary to proceed with endovaginal exam following the transabdominal exam to visualize the endometrium and ovaries. Endovaginal imaging limited by enlarged multinodular uterus.  COMPARISON:  None  FINDINGS: Uterus  Measurements: 17.2 x 10.1 x 13.5 cm. Significantly enlarged. Multinodular appearance compatible with multiple uterine leiomyomata. Measured lesions include a 5.9 x 7.5 x 6.6 cm diameter fundal leiomyoma, a 2.9 x 3.5 x 4.0 cm posterior leiomyoma, and a 3.9 x 3.0 x 3.2 cm anterior leiomyoma. Multiple nodular areas abut and distort the endometrial complex, which is poorly visualized.  Endometrium  Thickness: Inadequately visualized due to distortion by multiple uterine leiomyomata. No obvious endometrial fluid.  Right ovary  Measurements: 3.4 x 2.1 x 1.9 cm.  Normal morphology without mass.  Left ovary  Measurements: 3.5 x 1.8 x 2.0 cm.  Normal morphology without mass  Other findings  No abnormal free fluid.  IMPRESSION: Significantly enlarged multinodular uterus compatible with multiple uterine leiomyomata.  Nonvisualization of endometrial complex, though numerous uterine leiomyomata likely abut  and obscure the endometrial complex.   Electronically Signed   By: Lavonia Dana M.D.   On: 01/23/2018 16:55  Treatments: transfusion  Discharge Exam: Blood pressure 116/68, pulse 64, temperature 98.2 F (36.8 C), temperature source Oral, resp. rate 16, height 5' (1.524 m), weight 72.1 kg, last menstrual period 01/10/2018, SpO2 100 %. General appearance: alert, cooperative and no distress GI: normal findings: palpable fibroids  to umbilicus Skin: Skin color, texture, turgor normal. No rashes or lesions Neurologic: Grossly normal  Disposition: Discharge disposition: 01-Home or Self Care       Allergies as of 01/24/2018      Reactions   Compazine [prochlorperazine Edisylate] Other (See Comments)   Seizure      Medication List    TAKE these medications   albuterol 108 (90 Base) MCG/ACT inhaler Commonly known as:  PROVENTIL HFA;VENTOLIN HFA Inhale 2 puffs into the lungs every 6 (six) hours as needed for wheezing or shortness of breath.   benzonatate 100 MG capsule Commonly known as:  TESSALON Take 1 capsule (100 mg total) by mouth 3 (three) times daily as needed.   buPROPion 75 MG tablet Commonly known as:  WELLBUTRIN TAKE ONE TABLET BY MOUTH EVERY MORNING   clonazePAM 0.5 MG tablet Commonly known as:  KLONOPIN Take 1 tablet (0.5 mg total) by mouth 2 (two) times daily as needed for anxiety.   DULoxetine 60 MG capsule Commonly known as:  CYMBALTA Take 1 capsule (60 mg total) by mouth daily.   ferrous sulfate 325 (65 FE) MG tablet Take 1 tablet (325 mg total) by mouth 2 (two) times daily with a meal.   hydrOXYzine 25 MG capsule Commonly known as:  VISTARIL Take 25 mg by mouth at bedtime as needed (sleep).      Follow-up Information    Gae Dry, MD. Go in 1 week(s).   Specialty:  Obstetrics and Gynecology Contact information: [address] [place] [address] [phone]           Signed: Hoyt Koch 01/24/2018, 7:41 AM

## 2018-01-24 NOTE — Discharge Instructions (Signed)
Anemia Anemia is a condition in which you do not have enough red blood cells or hemoglobin. Hemoglobin is a substance in red blood cells that carries oxygen. When you do not have enough red blood cells or hemoglobin (are anemic), your body cannot get enough oxygen and your organs may not work properly. As a result, you may feel very tired or have other problems. What are the causes? Common causes of anemia include:  Excessive bleeding. Anemia can be caused by excessive bleeding inside or outside the body, including bleeding from the intestine or from periods in women.  Poor nutrition.  Long-lasting (chronic) kidney, thyroid, and liver disease.  Bone marrow disorders.  Cancer and treatments for cancer.  HIV (human immunodeficiency virus) and AIDS (acquired immunodeficiency syndrome).  Treatments for HIV and AIDS.  Spleen problems.  Blood disorders.  Infections, medicines, and autoimmune disorders that destroy red blood cells.  What are the signs or symptoms? Symptoms of this condition include:  Minor weakness.  Dizziness.  Headache.  Feeling heartbeats that are irregular or faster than normal (palpitations).  Shortness of breath, especially with exercise.  Paleness.  Cold sensitivity.  Indigestion.  Nausea.  Difficulty sleeping.  Difficulty concentrating.  Symptoms may occur suddenly or develop slowly. If your anemia is mild, you may not have symptoms. How is this diagnosed? This condition is diagnosed based on:  Blood tests.  Your medical history.  A physical exam.  Bone marrow biopsy.  Your health care provider may also check your stool (feces) for blood and may do additional testing to look for the cause of your bleeding. You may also have other tests, including:  Imaging tests, such as a CT scan or MRI.  Endoscopy.  Colonoscopy.  How is this treated? Treatment for this condition depends on the cause. If you continue to lose a lot of blood,  you may need to be treated at a hospital. Treatment may include:  Taking supplements of iron, vitamin T02, or folic acid.  Taking a hormone medicine (erythropoietin) that can help to stimulate red blood cell growth.  Having a blood transfusion. This may be needed if you lose a lot of blood.  Making changes to your diet.  Having surgery to remove your spleen.  Follow these instructions at home:  Take over-the-counter and prescription medicines only as told by your health care provider.  Take supplements only as told by your health care provider.  Follow any diet instructions that you were given.  Keep all follow-up visits as told by your health care provider. This is important. Contact a health care provider if:  You develop new bleeding anywhere in the body. Get help right away if:  You are very weak.  You are short of breath.  You have pain in your abdomen or chest.  You are dizzy or feel faint.  You have trouble concentrating.  You have bloody or black, tarry stools.  You vomit repeatedly or you vomit up blood. Summary  Anemia is a condition in which you do not have enough red blood cells or enough of a substance in your red blood cells that carries oxygen (hemoglobin).  Symptoms may occur suddenly or develop slowly.  If your anemia is mild, you may not have symptoms.  This condition is diagnosed with blood tests as well as a medical history and physical exam. Other tests may be needed.  Treatment for this condition depends on the cause of the anemia. This information is not intended to replace advice  given to you by your health care provider. Make sure you discuss any questions you have with your health care provider. Document Released: 04/27/2004 Document Revised: 04/21/2016 Document Reviewed: 04/21/2016 Elsevier Interactive Patient Education  2018 [employer].   Uterine Fibroids Uterine fibroids are tissue masses (tumors) that can develop in the womb  (uterus). They are also called leiomyomas. This type of tumor is not cancerous (benign) and does not spread to other parts of the body outside of the pelvic area, which is between the hip bones. Occasionally, fibroids may develop in the fallopian tubes, in the cervix, or on the support structures (ligaments) that surround the uterus. You can have one or many fibroids. Fibroids can vary in size, weight, and where they grow in the uterus. Some can become quite large. Most fibroids do not require medical treatment. What are the causes? A fibroid can develop when a single uterine cell keeps growing (replicating). Most cells in the human body have a control mechanism that keeps them from replicating without control. What are the signs or symptoms? Symptoms may include:  Heavy bleeding during your period.  Bleeding or spotting between periods.  Pelvic pain and pressure.  Bladder problems, such as needing to urinate more often (urinary frequency) or urgently.  Inability to reproduce offspring (infertility).  Miscarriages.  How is this diagnosed? Uterine fibroids are diagnosed through a physical exam. Your health care provider may feel the lumpy tumors during a pelvic exam. Ultrasonography and an MRI may be done to determine the size, location, and number of fibroids. How is this treated? Treatment may include:  Watchful waiting. This involves getting the fibroid checked by your health care provider to see if it grows or shrinks. Follow your health care provider's recommendations for how often to have this checked.  Hormone medicines. These can be taken by mouth or given through an intrauterine device (IUD).  Surgery. ? Removing the fibroids (myomectomy) or the uterus (hysterectomy). ? Removing blood supply to the fibroids (uterine artery embolization).  If fibroids interfere with your fertility and you want to become pregnant, your health care provider may recommend having the fibroids  removed. Follow these instructions at home:  Keep all follow-up visits as directed by your health care provider. This is important.  Take over-the-counter and prescription medicines only as told by your health care provider. ? If you were prescribed a hormone treatment, take the hormone medicines exactly as directed.  Ask your health care provider about taking iron pills and increasing the amount of dark green, leafy vegetables in your diet. These actions can help to boost your blood iron levels, which may be affected by heavy menstrual bleeding.  Pay close attention to your period and tell your health care provider about any changes, such as: ? Increased blood flow that requires you to use more pads or tampons than usual per month. ? A change in the number of days that your period lasts per month. ? A change in symptoms that are associated with your period, such as abdominal cramping or back pain. Contact a health care provider if:  You have pelvic pain, back pain, or abdominal cramps that cannot be controlled with medicines.  You have an increase in bleeding between and during periods.  You soak tampons or pads in a half hour or less.  You feel lightheaded, extra tired, or weak. Get help right away if:  You faint.  You have a sudden increase in pelvic pain. This information is not intended to  replace advice given to you by your health care provider. Make sure you discuss any questions you have with your health care provider. Document Released: 03/17/2000 Document Revised: 11/18/2015 Document Reviewed: 09/16/2013 Elsevier Interactive Patient Education  [employer].

## 2018-01-24 NOTE — Telephone Encounter (Signed)
Do you need to see patient or just get her scheduled for iron?

## 2018-01-24 NOTE — Telephone Encounter (Signed)
Admitted for severe IDA sec to menorraghia; Pt needs to be set up for IV iron infusions- starting next week.  Thanks GB

## 2018-01-24 NOTE — Consult Note (Signed)
[facility] CONSULT NOTE  Patient Care Team: Leone Haven, MD as PCP - General (Family Medicine)  CHIEF COMPLAINTS/PURPOSE OF CONSULTATION:    HISTORY OF PRESENTING ILLNESS:  Dana Dunlap 47 y.o.  female with long-standing history of severe anemia secondary menorrhagia/uterine fibroids is currently admitted to hospital for worsening shortness of breath severe anemia hemoglobin of 6.3 at admission.  Patient is currently status post 1 unit of PRBC-heme globin this morning is 9.3.  Patient feels symptomatically improved.  Patient had received IV iron infusions intermittently for the last many years.  However she has not had infusion the last 8 to 10 months. Patient states that she is recommended to have a hysterectomy however she wants holistic approach.  She is considering myomectomy at this time.   Patient has poor tolerance to p.o. Iron.  Otherwise no blood in stools black or stools.  No nausea vomiting.  Complains of pica.  ROS: A complete 10 point review of system is done which is negative except mentioned above in history of present illness  MEDICAL HISTORY:  Past Medical History:  Diagnosis Date  . Depression    Currently on Cymbalta  . Fibroids   . Frequent headaches    Worse during monthly menstrual cycle  . Hypertension     SURGICAL HISTORY: Past Surgical History:  Procedure Laterality Date  . BREAST MASS EXCISION     benign    SOCIAL HISTORY: Social History   Socioeconomic History  . Marital status: Single    Spouse name: Not on file  . Number of children: 0  . Years of education: 16  . Highest education level: Bachelor's degree (e.g., BA, AB, BS)  Occupational History  . Occupation: Psychologist, clinical: [employer]    Comment: Engineer, building services  Social Needs  . Financial resource strain: Not hard at all  . Food insecurity:    Worry: Never true    Inability: Never true  . Transportation needs:    Medical: No    Non-medical: No   Tobacco Use  . Smoking status: Never Smoker  . Smokeless tobacco: Never Used  Substance and Sexual Activity  . Alcohol use: Yes    Alcohol/week: 1.0 standard drinks    Types: 1 Glasses of wine per week    Comment: occasional alcohol use  . Drug use: No  . Sexual activity: Yes    Birth control/protection: Condom  Lifestyle  . Physical activity:    Days per week: 0 days    Minutes per session: 0 min  . Stress: Very much  Relationships  . Social connections:    Talks on phone: Once a week    Gets together: Never    Attends religious service: Never    Active member of club or organization: No    Attends meetings of clubs or organizations: Never    Relationship status: Never married  . Intimate partner violence:    Fear of current or ex partner: No    Emotionally abused: No    Physically abused: No    Forced sexual activity: No  Other Topics Concern  . Not on file  Social History Narrative   Leili is originally from [place]. She attended [employer] in [place] where she obtained her Therapist, nutritional in Biology in 1995. 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 She is in a long term relationship with her boyfried, Renelda Loma  Cunningham. They have been together for 6 years. [name] transferred to [facility] with [place] in January. She enjoys reading and she enjoys the outdoors. She loves to travel. 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    FAMILY HISTORY: Family History  Problem Relation Age of Onset  . Hypertension Mother   . Hyperlipidemia Mother   . Cancer Father        Prostate cancer  . Hypertension Father   . Hyperlipidemia Father   . Stroke Maternal Aunt   . Stroke Paternal Aunt   . Diabetes Paternal Aunt     ALLERGIES:  is allergic to compazine [prochlorperazine edisylate].  MEDICATIONS:  No current facility-administered medications for this encounter.    Current Outpatient  Medications  Medication Sig Dispense Refill  . albuterol (PROVENTIL HFA;VENTOLIN HFA) 108 (90 Base) MCG/ACT inhaler Inhale 2 puffs into the lungs every 6 (six) hours as needed for wheezing or shortness of breath. 1 Inhaler 2  . benzonatate (TESSALON) 100 MG capsule Take 1 capsule (100 mg total) by mouth 3 (three) times daily as needed. 30 capsule 0  . buPROPion (WELLBUTRIN) 75 MG tablet TAKE ONE TABLET BY MOUTH EVERY MORNING 30 tablet 0  . clonazePAM (KLONOPIN) 0.5 MG tablet Take 1 tablet (0.5 mg total) by mouth 2 (two) times daily as needed for anxiety. 60 tablet 0  . DULoxetine (CYMBALTA) 60 MG capsule Take 1 capsule (60 mg total) by mouth daily. 90 capsule 1  . ferrous sulfate (FEOSOL) 325 (65 FE) MG tablet Take 1 tablet (325 mg total) by mouth 2 (two) times daily with a meal. 180 tablet 3  . hydrOXYzine (VISTARIL) 25 MG capsule Take 25 mg by mouth at bedtime as needed (sleep).        .  PHYSICAL EXAMINATION:  Vitals:   01/24/18 0514 01/24/18 0821  BP: 116/68 128/80  Pulse: 64 68  Resp: 16 18  Temp: 98.2 F (36.8 C) 98.1 F (36.7 C)  SpO2: 100% 99%   Filed Weights   01/23/18 1508  Weight: 159 lb (72.1 kg)    GENERAL: Well-nourished well-developed; Alert, no distress and comfortable.   Alone. EYES: Positive for pallor OROPHARYNX: no thrush or ulceration. NECK: supple, no masses felt LYMPH:  no palpable lymphadenopathy in the cervical, axillary or inguinal regions LUNGS: decreased breath sounds to auscultation at bases and  No wheeze or crackles HEART/CVS: regular rate & rhythm and no murmurs; No lower extremity edema ABDOMEN: abdomen soft, non-tender and normal bowel sounds Musculoskeletal:no cyanosis of digits and no clubbing  PSYCH: alert & oriented x 3 with fluent speech NEURO: no focal motor/sensory deficits SKIN:  no rashes or significant lesions  LABORATORY DATA:  I have reviewed the data as listed Lab Results  Component Value Date   WBC 3.5 (L) 01/23/2018    HGB 9.3 (L) 01/23/2018   HCT 33.1 (L) 01/23/2018   MCV 65.2 (L) 01/23/2018   PLT 848 (H) 01/23/2018   Recent Labs    06/11/17 1055 01/23/18 1136  NA 138 140  K 3.8 3.8  CL 107 111  CO2 26 21*  GLUCOSE 88 97  BUN 6 10  CREATININE 0.85 0.87  CALCIUM 9.2 8.9  GFRNONAA  --  >60  GFRAA  --  >60  PROT 6.8 7.4  ALBUMIN 3.7 3.8  AST 13 16  ALT 9 13  ALKPHOS 36* 44  BILITOT 0.3 0.5    RADIOGRAPHIC STUDIES: I have personally reviewed the radiological images  as listed and agreed with the findings in the report. Dg Chest 2 View  Result Date: 01/23/2018 CLINICAL DATA:  Anemia with dizziness EXAM: CHEST - 2 VIEW COMPARISON:  None. FINDINGS: Lungs are clear. Heart size and pulmonary vascularity are normal. No adenopathy. There is lower thoracic dextroscoliosis. IMPRESSION: No edema or consolidation.  Heart size within normal limits. Electronically Signed   By: Lowella Grip III M.D.   On: 01/23/2018 12:30   US Pelvic Complete With Transvaginal  Result Date: 01/23/2018 CLINICAL DATA:  Anemia, heavy vaginal bleeding EXAM: TRANSABDOMINAL AND TRANSVAGINAL ULTRASOUND OF PELVIS TECHNIQUE: Both transabdominal and transvaginal ultrasound examinations of the pelvis were performed. Transabdominal technique was performed for global imaging of the pelvis including uterus, ovaries, adnexal regions, and pelvic cul-de-sac. It was necessary to proceed with endovaginal exam following the transabdominal exam to visualize the endometrium and ovaries. Endovaginal imaging limited by enlarged multinodular uterus. COMPARISON:  None FINDINGS: Uterus Measurements: 17.2 x 10.1 x 13.5 cm. Significantly enlarged. Multinodular appearance compatible with multiple uterine leiomyomata. Measured lesions include a 5.9 x 7.5 x 6.6 cm diameter fundal leiomyoma, a 2.9 x 3.5 x 4.0 cm posterior leiomyoma, and a 3.9 x 3.0 x 3.2 cm anterior leiomyoma. Multiple nodular areas abut and distort the endometrial complex, which is poorly  visualized. Endometrium Thickness: Inadequately visualized due to distortion by multiple uterine leiomyomata. No obvious endometrial fluid. Right ovary Measurements: 3.4 x 2.1 x 1.9 cm.  Normal morphology without mass. Left ovary Measurements: 3.5 x 1.8 x 2.0 cm.  Normal morphology without mass Other findings No abnormal free fluid. IMPRESSION: Significantly enlarged multinodular uterus compatible with multiple uterine leiomyomata. Nonvisualization of endometrial complex, though numerous uterine leiomyomata likely abut and obscure the endometrial complex. Electronically Signed   By: Lavonia Dana M.D.   On: 01/23/2018 16:55    ASSESSMENT & PLAN:   #47 year old female patient long-standing history of menorrhagia/fibroid uterus with severe iron deficiency anemia is currently admitted to hospital for worsening vaginal bleeding/severe anemia  #Severe anemia with severe iron deficiency-secondary to menorrhagia fibroid uterus.  Patient hemoglobin status post transfusion is around 9.  Clinically stable.  Recommend restarting IV iron outpatient.  #Thrombocytosis-platelet count on 800,000; reactive secondary to iron deficiency/previously Jak 2- negative.  Reassured the patient that thrombocytosis is likely secondary.  This should not be a contraindication to her having any kind of surgical procedures/and should not put patient at high risk  #Menorrhagia/fibroid uterus-patient reluctant to proceed with total hysterectomy.  She is interested in myomectomy/consideration of the uterus.  Defer to gynecology.  Thank you Dr. Kenton Kingfisher for allowing me to participate in the care of your pleasant patient. Please do not hesitate to contact me with questions or concerns in the interim.  Discussed with nursing staff.  Cancer center will contact the patient regarding outpatient IV on infusions.  All questions were answered. The patient knows to call the clinic with any problems, questions or concerns.    Cammie Sickle,  MD 01/24/2018 9:10 PM

## 2018-01-24 NOTE — Progress Notes (Signed)
[facility]   [id number] [place] [address] Phone: [phone] Fax: [phone]  January 24, 2018  Patient: Dana Dunlap  Date of Birth: 27-Jun-1970  Date of Visit: 01/23/2018    To Whom It May Concern:  [place] Doig was seen and treated in our Hospital overnight on 01/23/2018 to 01/24/2018. Corina Stacy  may return to work on 01/25/18.  Sincerely,  Barnett Applebaum, MD [facility]

## 2018-01-24 NOTE — Plan of Care (Signed)
Vs stable; has cough and can have neb treatments q4hrs as needed; has tesslon capsule also for cough; received 2 units packed red blood cells on 01-23-18; tolerating regular diet and PO fluids; will have IV converted to SL by end of shift

## 2018-01-24 NOTE — Progress Notes (Signed)
Provided and reviewed discharge paperwork. Verified understanding by use of teach back method. Pt verbalized understanding as well. Follow up appointment with [facility] provided. Cancer center to call patient to set up iron infusion follow up. Discharged via wheelchair by hospital volunteer. Significant other to transport home.

## 2018-01-25 ENCOUNTER — Other Ambulatory Visit: Payer: Self-pay

## 2018-01-25 ENCOUNTER — Telehealth: Payer: Self-pay | Admitting: Family Medicine

## 2018-01-25 DIAGNOSIS — F331 Major depressive disorder, recurrent, moderate: Secondary | ICD-10-CM

## 2018-01-25 MED ORDER — DULOXETINE HCL 60 MG PO CPEP: 60.0000 mg | ORAL_CAPSULE | Freq: Every day | ORAL | 1 refills | Status: DC

## 2018-01-25 NOTE — Telephone Encounter (Signed)
Tried to call patient for TCM patient voicemail is full could not leave message. Please try on Monday.

## 2018-01-25 NOTE — Telephone Encounter (Signed)
Called and spoke with pt. Pt stated that she is on the 60 MG dose and would like Rx sent to [facility] and NOT [facility].   Rx has been sent to [facility].

## 2018-01-28 ENCOUNTER — Ambulatory Visit: Payer: Self-pay | Admitting: Obstetrics & Gynecology

## 2018-01-28 NOTE — Telephone Encounter (Signed)
Attempt to call for transitional care management.  Unable to reach. Not able to leave a voice mail.  She does have an appointment scheduled today with OB/GYN as directed post discharge.  Will follow as appropriate.

## 2018-01-29 ENCOUNTER — Telehealth: Payer: Self-pay | Admitting: Family Medicine

## 2018-01-29 ENCOUNTER — Telehealth: Payer: Self-pay

## 2018-01-29 NOTE — Telephone Encounter (Signed)
Copied from [place] [id number]. Topic: Quick Communication - See Telephone Encounter >> Jan 29, 2018 11:35 AM Ahmed Prima L wrote: CRM for notification. See Telephone encounter for: 01/29/18.  [facility] states that albuterol (PROVENTIL HFA;VENTOLIN HFA) 108 (90 Base) MCG/ACT inhaler is not covered but Pro Air HFA & Ventolin HFA are covered. Please advise. Call back @ [phone] reference [id number]

## 2018-01-29 NOTE — Telephone Encounter (Signed)
Please contact the patient and let her know I received a disability benefits form from [employer] for an absence starting on 01/23/2018.  Please find out if she has returned to work.  Please find out if this was just related to her recent hospitalization or is she having trouble completing work and if so for what reason.  I need to have these answers prior to filling out her form.  Thanks.

## 2018-01-29 NOTE — Telephone Encounter (Signed)
Unable to reach patient for transitional care management. Will follow as appropriate.  

## 2018-01-30 MED ORDER — ALBUTEROL SULFATE HFA 108 (90 BASE) MCG/ACT IN AERS: 1.0000 | INHALATION_SPRAY | Freq: Four times a day (QID) | RESPIRATORY_TRACT | 0 refills | Status: DC | PRN

## 2018-01-30 NOTE — Telephone Encounter (Signed)
Replacement sent to pharmacy.

## 2018-01-30 NOTE — Telephone Encounter (Signed)
Sent to PCP for approval. Please advise.   Thanks

## 2018-01-30 NOTE — Telephone Encounter (Signed)
Called pt and was unable to leave a VM due to mailbox being full at this time.  

## 2018-01-30 NOTE — Telephone Encounter (Signed)
Unable to reach for TCM.

## 2018-01-31 ENCOUNTER — Telehealth: Payer: Self-pay | Admitting: Family Medicine

## 2018-01-31 NOTE — Telephone Encounter (Signed)
Opened in error

## 2018-01-31 NOTE — Telephone Encounter (Signed)
Called and spoke with pt. Pt stated Dana Dunlap does NOT need to fill out this paper work as it is not needed. Pt stated that this was something that her job asked her to do incase she did have to miss a lot of work but, she returned back to work after her blood transfusion as the doctor told her she was fine to do so.   Pt stated that she does plan to f/u with the hematologist doctor.  Pt stated that she is still not feeling that well unsure if she was given a blood thinner to take before or after her blood infusion. Pt stated that she knows she was given a fluid pill to take and benadryl and was hooked up to a saline  bag after the infusion. Pt does NOT know if they gave her a blood thinner as well. However, she has noticed that her urine is very dark and she is concern of hematuria. Pt stated that this has happened before when she told a blood thinner she had blood in her urine. Pt stated that she knows that she needs to schedule an appt with you soon.   Pt does plan to go to urgent care her urine color doesn't get any better.

## 2018-01-31 NOTE — Telephone Encounter (Signed)
Noted. I agree that the patient should be evaluated given her urine color. Even if it resolves she should be seen in clinic or urgent care for evaluation.

## 2018-02-04 ENCOUNTER — Other Ambulatory Visit: Payer: Self-pay | Admitting: *Deleted

## 2018-02-04 DIAGNOSIS — D649 Anemia, unspecified: Secondary | ICD-10-CM

## 2018-02-05 ENCOUNTER — Inpatient Hospital Stay: Payer: 59 | Attending: Oncology | Admitting: Oncology

## 2018-02-05 ENCOUNTER — Inpatient Hospital Stay: Payer: 59

## 2018-02-05 ENCOUNTER — Other Ambulatory Visit: Payer: Self-pay

## 2018-02-05 VITALS — BP 143/89 | HR 94 | Temp 96.8°F | Resp 18 | Wt 157.0 lb

## 2018-02-05 DIAGNOSIS — D500 Iron deficiency anemia secondary to blood loss (chronic): Secondary | ICD-10-CM | POA: Diagnosis not present

## 2018-02-05 DIAGNOSIS — D473 Essential (hemorrhagic) thrombocythemia: Secondary | ICD-10-CM

## 2018-02-05 DIAGNOSIS — D259 Leiomyoma of uterus, unspecified: Secondary | ICD-10-CM | POA: Insufficient documentation

## 2018-02-05 DIAGNOSIS — D649 Anemia, unspecified: Secondary | ICD-10-CM

## 2018-02-05 DIAGNOSIS — D508 Other iron deficiency anemias: Secondary | ICD-10-CM

## 2018-02-05 LAB — CBC WITH DIFFERENTIAL/PLATELET
Abs Immature Granulocytes: 0.01 10*3/uL (ref 0.00–0.07)
Basophils Absolute: 0 10*3/uL (ref 0.0–0.1)
Basophils Relative: 1 %
Eosinophils Absolute: 0 10*3/uL (ref 0.0–0.5)
Eosinophils Relative: 1 %
HEMATOCRIT: 29.4 % — AB (ref 36.0–46.0)
Hemoglobin: 7.7 g/dL — ABNORMAL LOW (ref 12.0–15.0)
Immature Granulocytes: 0 %
LYMPHS ABS: 1.4 10*3/uL (ref 0.7–4.0)
LYMPHS PCT: 26 %
MCH: 17.5 pg — ABNORMAL LOW (ref 26.0–34.0)
MCHC: 26.2 g/dL — ABNORMAL LOW (ref 30.0–36.0)
MCV: 66.8 fL — ABNORMAL LOW (ref 80.0–100.0)
MONO ABS: 0.6 10*3/uL (ref 0.1–1.0)
MONOS PCT: 12 %
NEUTROS PCT: 60 %
Neutro Abs: 3.2 10*3/uL (ref 1.7–7.7)
Platelets: 319 10*3/uL (ref 150–400)
RBC: 4.4 MIL/uL (ref 3.87–5.11)
RDW: 29.2 % — AB (ref 11.5–15.5)
SMEAR REVIEW: ADEQUATE
WBC: 5.3 10*3/uL (ref 4.0–10.5)
nRBC: 0 % (ref 0.0–0.2)

## 2018-02-05 LAB — IRON AND TIBC
IRON: 10 ug/dL — AB (ref 28–170)
Saturation Ratios: 3 % — ABNORMAL LOW (ref 10.4–31.8)
TIBC: 332 ug/dL (ref 250–450)
UIBC: 322 ug/dL

## 2018-02-05 LAB — SAMPLE TO BLOOD BANK

## 2018-02-05 LAB — FERRITIN: FERRITIN: 51 ng/mL (ref 11–307)

## 2018-02-05 MED ORDER — SODIUM CHLORIDE 0.9 % IV SOLN
Freq: Once | INTRAVENOUS | Status: AC
  Administered 2018-02-05: 13:00:00 via INTRAVENOUS
  Filled 2018-02-05: qty 250

## 2018-02-05 MED ORDER — SODIUM CHLORIDE 0.9 % IV SOLN
510.0000 mg | Freq: Once | INTRAVENOUS | Status: AC
  Administered 2018-02-05: 510 mg via INTRAVENOUS
  Filled 2018-02-05: qty 17

## 2018-02-05 NOTE — Progress Notes (Signed)
Here for follow up. Per pt "   I wonder if they put something in my iv bag when I got my transfusion 01/22/18  Had blood in urine 2 days later -lasted 2 days. None now she stated. " per pt energy level low feeling weak "

## 2018-02-05 NOTE — Progress Notes (Signed)
[facility]  [phone] [phone]  ID: Dana Dunlap OB: [birth year]  MR#: [id number]  TGG#:[id number]  Patient Care Team: Leone Haven, MD as PCP - General (Family Medicine)  CHIEF COMPLAINT: Thrombocytosis, iron deficiency anemia.  INTERVAL HISTORY: Patient returns to clinic today for repeat laboratory work and hospital follow-up.  She was recently admitted to the hospital with significant anemia down to 6.3 requiring 2 units of packed red blood cells.  She continues to have heavy menses and now states she is pursuing treatment options at [facility]. She continues to have persistent weakness and fatigue. She has no neurologic complaints. She has no chest pain or shortness of breath. She denies any nausea, vomiting, constipation, or diarrhea. She has no melena or hematochezia.  Patient states she has some hematuria, but then agreed it may be contaminant from heavy menses.  She offers no further specific complaints.  REVIEW OF SYSTEMS:   Review of Systems  Constitutional: Positive for malaise/fatigue. Negative for fever and weight loss.  HENT: Negative.  Negative for congestion and sore throat.   Respiratory: Negative.  Negative for cough and shortness of breath.   Cardiovascular: Negative.  Negative for chest pain and leg swelling.  Gastrointestinal: Negative.  Negative for abdominal pain and blood in stool.  Genitourinary: Positive for hematuria.  Musculoskeletal: Negative.  Negative for joint pain.  Skin: Negative.  Negative for rash.  Neurological: Positive for weakness.  Psychiatric/Behavioral: Negative for depression. The patient is not nervous/anxious.     As per HPI. Otherwise, a complete review of systems is negative.  PAST MEDICAL HISTORY: Past Medical History:  Diagnosis Date  . Depression    Currently on Cymbalta  . Fibroids   . Frequent headaches    Worse during monthly menstrual cycle  . Hypertension     PAST SURGICAL  HISTORY: Past Surgical History:  Procedure Laterality Date  . BREAST MASS EXCISION     benign    FAMILY HISTORY: Family History  Problem Relation Age of Onset  . Hypertension Mother   . Hyperlipidemia Mother   . Cancer Father        Prostate cancer  . Hypertension Father   . Hyperlipidemia Father   . Stroke Maternal Aunt   . Stroke Paternal Aunt   . Diabetes Paternal Aunt     ADVANCED DIRECTIVES (Y/N):  N  HEALTH MAINTENANCE: Social History   Tobacco Use  . Smoking status: Never Smoker  . Smokeless tobacco: Never Used  Substance Use Topics  . Alcohol use: Yes    Alcohol/week: 1.0 standard drinks    Types: 1 Glasses of wine per week    Comment: occasional alcohol use  . Drug use: No     Colonoscopy:  PAP:  Bone density:  Lipid panel:  Allergies  Allergen Reactions  . Compazine [Prochlorperazine Edisylate] Other (See Comments)    Seizure     Current Outpatient Medications  Medication Sig Dispense Refill  . buPROPion (WELLBUTRIN) 75 MG tablet TAKE ONE TABLET BY MOUTH EVERY MORNING 30 tablet 0  . DULoxetine (CYMBALTA) 60 MG capsule Take 1 capsule (60 mg total) by mouth daily. 90 capsule 1  . ferrous sulfate (FEOSOL) 325 (65 FE) MG tablet Take 1 tablet (325 mg total) by mouth 2 (two) times daily with a meal. 180 tablet 3  . hydrOXYzine (VISTARIL) 25 MG capsule Take 25 mg by mouth at bedtime as needed (sleep).    Marland Kitchen albuterol (VENTOLIN HFA) 108 (90 Base) MCG/ACT inhaler  Inhale 1-2 puffs into the lungs every 6 (six) hours as needed for wheezing or shortness of breath. (Patient not taking: Reported on 02/05/2018) 1 Inhaler 0   No current facility-administered medications for this visit.     OBJECTIVE: Vitals:   02/05/18 1135  BP: (!) 143/89  Pulse: 94  Resp: 18  Temp: (!) 96.8 F (36 C)     Body mass index is 30.66 kg/m.    ECOG FS:0 - Asymptomatic  General: Well-developed, well-nourished, no acute distress. Eyes: Pink conjunctiva, anicteric  sclera. HEENT: Normocephalic, moist mucous membranes. Lungs: Clear to auscultation bilaterally. Heart: Regular rate and rhythm. No rubs, murmurs, or gallops. Abdomen: Soft, nontender, nondistended. No organomegaly noted, normoactive bowel sounds. Musculoskeletal: No edema, cyanosis, or clubbing. Neuro: Alert, answering all questions appropriately. Cranial nerves grossly intact. Skin: No rashes or petechiae noted. Psych: Normal affect.  LAB RESULTS:  Lab Results  Component Value Date   NA 140 01/23/2018   K 3.8 01/23/2018   CL 111 01/23/2018   CO2 21 (L) 01/23/2018   GLUCOSE 97 01/23/2018   BUN 10 01/23/2018   CREATININE 0.87 01/23/2018   CALCIUM 8.9 01/23/2018   PROT 7.4 01/23/2018   ALBUMIN 3.8 01/23/2018   AST 16 01/23/2018   ALT 13 01/23/2018   ALKPHOS 44 01/23/2018   BILITOT 0.5 01/23/2018   GFRNONAA >60 01/23/2018   GFRAA >60 01/23/2018    Lab Results  Component Value Date   WBC 5.3 02/05/2018   NEUTROABS 3.2 02/05/2018   HGB 7.7 (L) 02/05/2018   HCT 29.4 (L) 02/05/2018   MCV 66.8 (L) 02/05/2018   PLT 319 02/05/2018   Lab Results  Component Value Date   IRON 7 (LL) 01/22/2018   TIBC 365 01/22/2018   IRONPCTSAT 2 (LL) 01/22/2018   Lab Results  Component Value Date   FERRITIN 9 (L) 01/22/2018     STUDIES: Dg Chest 2 View  Result Date: 01/23/2018 CLINICAL DATA:  Anemia with dizziness EXAM: CHEST - 2 VIEW COMPARISON:  None. FINDINGS: Lungs are clear. Heart size and pulmonary vascularity are normal. No adenopathy. There is lower thoracic dextroscoliosis. IMPRESSION: No edema or consolidation.  Heart size within normal limits. Electronically Signed   By: Lowella Grip III M.D.   On: 01/23/2018 12:30   US Pelvic Complete With Transvaginal  Result Date: 01/23/2018 CLINICAL DATA:  Anemia, heavy vaginal bleeding EXAM: TRANSABDOMINAL AND TRANSVAGINAL ULTRASOUND OF PELVIS TECHNIQUE: Both transabdominal and transvaginal ultrasound examinations of the pelvis  were performed. Transabdominal technique was performed for global imaging of the pelvis including uterus, ovaries, adnexal regions, and pelvic cul-de-sac. It was necessary to proceed with endovaginal exam following the transabdominal exam to visualize the endometrium and ovaries. Endovaginal imaging limited by enlarged multinodular uterus. COMPARISON:  None FINDINGS: Uterus Measurements: 17.2 x 10.1 x 13.5 cm. Significantly enlarged. Multinodular appearance compatible with multiple uterine leiomyomata. Measured lesions include a 5.9 x 7.5 x 6.6 cm diameter fundal leiomyoma, a 2.9 x 3.5 x 4.0 cm posterior leiomyoma, and a 3.9 x 3.0 x 3.2 cm anterior leiomyoma. Multiple nodular areas abut and distort the endometrial complex, which is poorly visualized. Endometrium Thickness: Inadequately visualized due to distortion by multiple uterine leiomyomata. No obvious endometrial fluid. Right ovary Measurements: 3.4 x 2.1 x 1.9 cm.  Normal morphology without mass. Left ovary Measurements: 3.5 x 1.8 x 2.0 cm.  Normal morphology without mass Other findings No abnormal free fluid. IMPRESSION: Significantly enlarged multinodular uterus compatible with multiple uterine leiomyomata. Nonvisualization of endometrial  complex, though numerous uterine leiomyomata likely abut and obscure the endometrial complex. Electronically Signed   By: Lavonia Dana M.D.   On: 01/23/2018 16:55    ASSESSMENT: Thrombocytosis, iron deficiency anemia  PLAN:    1. Thrombocytosis: Patient's platelet count is within normal limits today.  Previous elevation would likely reactive secondary to severe iron deficiency anemia.  JAK-2 mutation is negative. No further intervention is needed.  2. Iron deficiency anemia: Secondary to heavy fibroid bleeding. Despite recommendations, patient has refused hysterectomy. She has had multiple appointments with gynecology for evaluation, but has missed or canceled much of these appointments.  Patient now states she is  going to [facility] for further evaluation.  Patient's hemoglobin continues to be significantly decreased, although improved from her hospital admission last.  She does not require transfusion today, but will receive 510 mg IV Feraheme.  Return to clinic in 1 week for second infusion.  Patient will then return to clinic in 6 weeks for repeat laboratory work and further evaluation.   3. Fibroids: Chronic.  Patient states she is now going to [facility] for further evaluation.   4.  Leukopenia: Resolved.  Patient expressed understanding and was in agreement with this plan. She also understands that She can call clinic at any time with any questions, concerns, or complaints.    Lloyd Huger, MD   02/05/2018 1:00 PM

## 2018-02-12 ENCOUNTER — Inpatient Hospital Stay: Payer: 59

## 2018-02-12 ENCOUNTER — Other Ambulatory Visit: Payer: Self-pay | Admitting: Oncology

## 2018-02-12 VITALS — BP 127/74 | HR 81 | Temp 97.3°F | Resp 19

## 2018-02-12 DIAGNOSIS — D500 Iron deficiency anemia secondary to blood loss (chronic): Secondary | ICD-10-CM | POA: Diagnosis not present

## 2018-02-12 DIAGNOSIS — D509 Iron deficiency anemia, unspecified: Secondary | ICD-10-CM

## 2018-02-12 DIAGNOSIS — D508 Other iron deficiency anemias: Secondary | ICD-10-CM

## 2018-02-12 MED ORDER — SODIUM CHLORIDE 0.9 % IV SOLN
INTRAVENOUS | Status: DC
  Administered 2018-02-12: 14:00:00 via INTRAVENOUS
  Filled 2018-02-12: qty 250

## 2018-02-12 MED ORDER — SODIUM CHLORIDE 0.9 % IV SOLN
510.0000 mg | Freq: Once | INTRAVENOUS | Status: AC
  Administered 2018-02-12: 510 mg via INTRAVENOUS
  Filled 2018-02-12: qty 17

## 2018-02-21 ENCOUNTER — Telehealth: Payer: Self-pay

## 2018-02-21 NOTE — Telephone Encounter (Signed)
Noted  

## 2018-02-21 NOTE — Telephone Encounter (Signed)
Sent  To PCP as an FYI    Copied from [place] [id number]. Topic: General - Other >> Feb 15, 2018 12:09 PM Leward Quan A wrote: Reason for CRM: Patient called to inform Dr [name] that she have found a doctor here in [place] that she will allow to perform her procedure concerning her fibroids. Patient will be coming in to office to give consent for records to be sent to Dr Danne Harbor [facility] GYN Surgeon. Dr Landry Mellow is the one that patient will work with to have procedure performed. Any questions please call patient at, Ph# [phone]

## 2018-02-21 NOTE — Telephone Encounter (Signed)
Error

## 2018-03-17 NOTE — Progress Notes (Signed)
[place]  [phone] [phone]) [phone]  ID: Dana Dunlap OB: 28-May-1970  MR#: [id number]  EZM#:[id number]  Patient Care Team: Leone Haven, MD as PCP - General (Family Medicine)  CHIEF COMPLAINT: Thrombocytosis, iron deficiency anemia.  INTERVAL HISTORY: Patient returns to clinic today for repeat laboratory work, further evaluation, and consideration of additional IV Feraheme.  She continues to have chronic weakness and fatigue. She continues to have heavy menses and continues to pursue treatment options at [facility]. She has no neurologic complaints. She has no chest pain or shortness of breath. She denies any nausea, vomiting, constipation, or diarrhea. She has no melena or hematochezia.  She has no urinary complaints.  Patient offers no further specific complaints today.  REVIEW OF SYSTEMS:   Review of Systems  Constitutional: Positive for malaise/fatigue. Negative for fever and weight loss.  HENT: Negative.  Negative for congestion and sore throat.   Respiratory: Negative.  Negative for cough and shortness of breath.   Cardiovascular: Negative.  Negative for chest pain and leg swelling.  Gastrointestinal: Negative.  Negative for abdominal pain and blood in stool.  Genitourinary: Negative.  Negative for hematuria.  Musculoskeletal: Negative.  Negative for joint pain.  Skin: Negative.  Negative for rash.  Neurological: Positive for weakness. Negative for focal weakness and headaches.  Psychiatric/Behavioral: Negative for depression. The patient is not nervous/anxious.     As per HPI. Otherwise, a complete review of systems is negative.  PAST MEDICAL HISTORY: Past Medical History:  Diagnosis Date  . Depression    Currently on Cymbalta  . Fibroids   . Frequent headaches    Worse during monthly menstrual cycle  . Hypertension     PAST SURGICAL HISTORY: Past Surgical History:  Procedure Laterality Date  . BREAST MASS EXCISION     benign      FAMILY HISTORY: Family History  Problem Relation Age of Onset  . Hypertension Mother   . Hyperlipidemia Mother   . Cancer Father        Prostate cancer  . Hypertension Father   . Hyperlipidemia Father   . Stroke Maternal Aunt   . Stroke Paternal Aunt   . Diabetes Paternal Aunt     ADVANCED DIRECTIVES (Y/N):  N  HEALTH MAINTENANCE: Social History   Tobacco Use  . Smoking status: Never Smoker  . Smokeless tobacco: Never Used  Substance Use Topics  . Alcohol use: Yes    Alcohol/week: 1.0 standard drinks    Types: 1 Glasses of wine per week    Comment: occasional alcohol use  . Drug use: No     Colonoscopy:  PAP:  Bone density:  Lipid panel:  Allergies  Allergen Reactions  . Compazine [Prochlorperazine Edisylate] Other (See Comments)    Seizure     Current Outpatient Medications  Medication Sig Dispense Refill  . albuterol (VENTOLIN HFA) 108 (90 Base) MCG/ACT inhaler Inhale 1-2 puffs into the lungs every 6 (six) hours as needed for wheezing or shortness of breath. 1 Inhaler 0  . buPROPion (WELLBUTRIN) 75 MG tablet TAKE ONE TABLET BY MOUTH EVERY MORNING 30 tablet 0  . DULoxetine (CYMBALTA) 60 MG capsule Take 1 capsule (60 mg total) by mouth daily. 90 capsule 1  . ferrous sulfate (FEOSOL) 325 (65 FE) MG tablet Take 1 tablet (325 mg total) by mouth 2 (two) times daily with a meal. 180 tablet 3  . hydrOXYzine (VISTARIL) 25 MG capsule Take 25 mg by mouth at bedtime as needed (sleep).  No current facility-administered medications for this visit.     OBJECTIVE: Vitals:   03/19/18 1334  BP: 130/89  Pulse: 80  Temp: (!) 97.3 F (36.3 C)     Body mass index is 31.62 kg/m.    ECOG FS:0 - Asymptomatic  General: Well-developed, well-nourished, no acute distress. Eyes: Pink conjunctiva, anicteric sclera. HEENT: Normocephalic, moist mucous membranes. Lungs: Clear to auscultation bilaterally. Heart: Regular rate and rhythm. No rubs, murmurs, or  gallops. Abdomen: Soft, nontender, nondistended. No organomegaly noted, normoactive bowel sounds. Musculoskeletal: No edema, cyanosis, or clubbing. Neuro: Alert, answering all questions appropriately. Cranial nerves grossly intact. Skin: No rashes or petechiae noted. Psych: Normal affect.  LAB RESULTS:  Lab Results  Component Value Date   NA 140 01/23/2018   K 3.8 01/23/2018   CL 111 01/23/2018   CO2 21 (L) 01/23/2018   GLUCOSE 97 01/23/2018   BUN 10 01/23/2018   CREATININE 0.87 01/23/2018   CALCIUM 8.9 01/23/2018   PROT 7.4 01/23/2018   ALBUMIN 3.8 01/23/2018   AST 16 01/23/2018   ALT 13 01/23/2018   ALKPHOS 44 01/23/2018   BILITOT 0.5 01/23/2018   GFRNONAA >60 01/23/2018   GFRAA >60 01/23/2018    Lab Results  Component Value Date   WBC 2.5 (L) 03/19/2018   NEUTROABS 1.3 (L) 03/19/2018   HGB 9.7 (L) 03/19/2018   HCT 33.5 (L) 03/19/2018   MCV 76.1 (L) 03/19/2018   PLT 362 03/19/2018   Lab Results  Component Value Date   IRON 20 (L) 03/19/2018   TIBC 278 03/19/2018   IRONPCTSAT 7 (L) 03/19/2018   Lab Results  Component Value Date   FERRITIN 59 03/19/2018     STUDIES: No results found.  ASSESSMENT: Thrombocytosis, iron deficiency anemia  PLAN:    1. Thrombocytosis: Platelet count continues to be within normal limits.  JAK-2 mutation is negative. No further intervention is needed.  2. Iron deficiency anemia: Secondary to heavy fibroid bleeding. Despite recommendations, patient has refused hysterectomy. She has had multiple appointments with gynecology for evaluation, but has missed or canceled much of these appointments.  Patient now states she is going to [facility] for further evaluation.  Patient's hemoglobin continues to be decreased, although it has improved.  Her iron stores continue to be decreased, therefore proceed with 510 mg IV Feraheme today.  Return to clinic in 2 months with repeat laboratory work and further evaluation.  3. Fibroids:  Chronic.  Patient states she is now going to [facility] for further evaluation.   4.  Leukopenia: Mild, monitor.  Patient expressed understanding and was in agreement with this plan. She also understands that She can call clinic at any time with any questions, concerns, or complaints.    Lloyd Huger, MD   03/21/2018 2:13 PM

## 2018-03-19 ENCOUNTER — Inpatient Hospital Stay: Payer: 59

## 2018-03-19 ENCOUNTER — Inpatient Hospital Stay (HOSPITAL_BASED_OUTPATIENT_CLINIC_OR_DEPARTMENT_OTHER): Payer: 59 | Admitting: Oncology

## 2018-03-19 ENCOUNTER — Other Ambulatory Visit: Payer: Self-pay

## 2018-03-19 ENCOUNTER — Inpatient Hospital Stay: Payer: 59 | Attending: Oncology

## 2018-03-19 VITALS — BP 130/89 | HR 80 | Temp 97.3°F | Ht 60.0 in | Wt 161.9 lb

## 2018-03-19 DIAGNOSIS — D259 Leiomyoma of uterus, unspecified: Secondary | ICD-10-CM | POA: Diagnosis not present

## 2018-03-19 DIAGNOSIS — D649 Anemia, unspecified: Secondary | ICD-10-CM

## 2018-03-19 DIAGNOSIS — D72819 Decreased white blood cell count, unspecified: Secondary | ICD-10-CM

## 2018-03-19 DIAGNOSIS — N939 Abnormal uterine and vaginal bleeding, unspecified: Secondary | ICD-10-CM | POA: Diagnosis not present

## 2018-03-19 DIAGNOSIS — D508 Other iron deficiency anemias: Secondary | ICD-10-CM | POA: Insufficient documentation

## 2018-03-19 DIAGNOSIS — R7989 Other specified abnormal findings of blood chemistry: Secondary | ICD-10-CM

## 2018-03-19 DIAGNOSIS — D500 Iron deficiency anemia secondary to blood loss (chronic): Secondary | ICD-10-CM

## 2018-03-19 LAB — CBC WITH DIFFERENTIAL/PLATELET
Abs Immature Granulocytes: 0 10*3/uL (ref 0.00–0.07)
Basophils Absolute: 0 10*3/uL (ref 0.0–0.1)
Basophils Relative: 1 %
Eosinophils Absolute: 0 10*3/uL (ref 0.0–0.5)
Eosinophils Relative: 2 %
HCT: 33.5 % — ABNORMAL LOW (ref 36.0–46.0)
Hemoglobin: 9.7 g/dL — ABNORMAL LOW (ref 12.0–15.0)
Immature Granulocytes: 0 %
Lymphocytes Relative: 37 %
Lymphs Abs: 0.9 10*3/uL (ref 0.7–4.0)
MCH: 22 pg — ABNORMAL LOW (ref 26.0–34.0)
MCHC: 29 g/dL — AB (ref 30.0–36.0)
MCV: 76.1 fL — ABNORMAL LOW (ref 80.0–100.0)
Monocytes Absolute: 0.2 10*3/uL (ref 0.1–1.0)
Monocytes Relative: 9 %
NRBC: 0 % (ref 0.0–0.2)
Neutro Abs: 1.3 10*3/uL — ABNORMAL LOW (ref 1.7–7.7)
Neutrophils Relative %: 51 %
Platelets: 362 10*3/uL (ref 150–400)
RBC: 4.4 MIL/uL (ref 3.87–5.11)
RDW: 22.8 % — ABNORMAL HIGH (ref 11.5–15.5)
WBC: 2.5 10*3/uL — ABNORMAL LOW (ref 4.0–10.5)

## 2018-03-19 LAB — IRON AND TIBC
Iron: 20 ug/dL — ABNORMAL LOW (ref 28–170)
Saturation Ratios: 7 % — ABNORMAL LOW (ref 10.4–31.8)
TIBC: 278 ug/dL (ref 250–450)
UIBC: 258 ug/dL

## 2018-03-19 LAB — SAMPLE TO BLOOD BANK

## 2018-03-19 LAB — FERRITIN: Ferritin: 59 ng/mL (ref 11–307)

## 2018-03-19 MED ORDER — SODIUM CHLORIDE 0.9 % IV SOLN
Freq: Once | INTRAVENOUS | Status: AC
  Administered 2018-03-19: 14:00:00 via INTRAVENOUS
  Filled 2018-03-19: qty 250

## 2018-03-19 MED ORDER — SODIUM CHLORIDE 0.9 % IV SOLN
510.0000 mg | Freq: Once | INTRAVENOUS | Status: AC
  Administered 2018-03-19: 510 mg via INTRAVENOUS
  Filled 2018-03-19: qty 17

## 2018-03-19 NOTE — Progress Notes (Signed)
Patient is here today to follow up on her iron deficiency, anemia. Patient stated that she stays tired and sleeping all the times. Patient denied fever, chills, nausea, vomiting, constipation, diarrhea and SOB. Patient also wanted to sign a release of information so we could transfer her medical records to Dr. Jene Every ([facility]) for cervical fibroids.

## 2018-03-29 DIAGNOSIS — M3501 Sicca syndrome with keratoconjunctivitis: Secondary | ICD-10-CM | POA: Diagnosis not present

## 2018-05-01 ENCOUNTER — Ambulatory Visit: Payer: Self-pay

## 2018-05-01 NOTE — Telephone Encounter (Signed)
Pt called very concerned about pain to her left knee.  She states she had this pain a year ago and Dr [name] injected it. This pain and swelling started on Thursday last week and she has treated with heat and rest.  She has taken tylenol for the pain. She rates the pain at 8. She can walk but it is very painful.  She has a job which requires her to be on her feet all day. Appointment scheduled per protocol. Care advice read to patient. Patient verbalized understanding of all instructions.  Reason for Disposition . [1] Swollen joint AND [2] no fever or redness  Answer Assessment - Initial Assessment Questions 1. LOCATION and RADIATION: "Where is the pain located?"      Left knee 2. QUALITY: "What does the pain feel like?"  (e.g., sharp, dull, aching, burning)     sharp 3. SEVERITY: "How bad is the pain?" "What does it keep you from doing?"   (Scale 1-10; or mild, moderate, severe)   -  MILD (1-3): doesn't interfere with normal activities    -  MODERATE (4-7): interferes with normal activities (e.g., work or school) or awakens from sleep, limping    -  SEVERE (8-10): excruciating pain, unable to do any normal activities, unable to walk     8 4. ONSET: "When did the pain start?" "Does it come and go, or is it there all the time?"     thursday 5. RECURRENT: "Have you had this pain before?" If so, ask: "When, and what happened then?"     Yes last year 6. SETTING: "Has there been any recent work, exercise or other activity that involved that part of the body?"      Always on her feet 7. AGGRAVATING FACTORS: "What makes the knee pain worse?" (e.g., walking, climbing stairs, running)     Walking and climbing stair is worse 8. ASSOCIATED SYMPTOMS: "Is there any swelling or redness of the knee?"   Swelling 9. OTHER SYMPTOMS: "Do you have any other symptoms?" (e.g., chest pain, difficulty breathing, fever, calf pain)     no 10. PREGNANCY: "Is there any chance you are pregnant?" "When was your  last menstrual period?"       No last week  Protocols used: KNEE PAIN-A-AH

## 2018-05-03 ENCOUNTER — Ambulatory Visit: Payer: 59 | Admitting: Family Medicine

## 2018-05-03 ENCOUNTER — Encounter: Payer: Self-pay | Admitting: Family Medicine

## 2018-05-03 ENCOUNTER — Ambulatory Visit (INDEPENDENT_AMBULATORY_CARE_PROVIDER_SITE_OTHER): Payer: 59

## 2018-05-03 VITALS — BP 116/70 | HR 70 | Temp 97.8°F | Ht 60.0 in | Wt 164.8 lb

## 2018-05-03 DIAGNOSIS — M1712 Unilateral primary osteoarthritis, left knee: Secondary | ICD-10-CM | POA: Diagnosis not present

## 2018-05-03 DIAGNOSIS — M25562 Pain in left knee: Secondary | ICD-10-CM

## 2018-05-03 MED ORDER — METHYLPREDNISOLONE 4 MG PO TBPK: ORAL_TABLET | ORAL | 0 refills | Status: DC

## 2018-05-03 NOTE — Progress Notes (Signed)
Subjective:    Patient ID: Dana Dunlap, female    DOB: 1970-08-28, 48 y.o.   MRN: [id number]  HPI   Presents to clinic c/o left knee pain for past week.  Denies any known injury to the left knee.  States she does have a history of osteoarthritis in her right knee, states the pain in her left groin similar to when her arthritis in the right knee acted up.  Patient states she did end up getting joint injection in the right knee last year, this was quite helpful to her.  Denies fever or chills.  Denies redness or swelling in left knee.  Patient Active Problem List   Diagnosis Date Noted  . Symptomatic anemia 01/23/2018  . Right knee pain 01/09/2017  . Hyperlipidemia 06/27/2016  . Thrombocytosis ([place]) 03/22/2016  . Anxiety and depression 03/21/2016  . Excessive and frequent menstruation 08/21/2013  . Fibroid, uterine 07/28/2013  . Anemia, iron deficiency 07/28/2013  . HTN (hypertension) 03/26/2013   Social History   Tobacco Use  . Smoking status: Never Smoker  . Smokeless tobacco: Never Used  Substance Use Topics  . Alcohol use: Yes    Alcohol/week: 1.0 standard drinks    Types: 1 Glasses of wine per week    Comment: occasional alcohol use   Review of Systems  Constitutional: Negative for chills, fatigue and fever.  HENT: Negative for congestion, ear pain, sinus pain and sore throat.   Eyes: Negative.   Respiratory: Negative for cough, shortness of breath and wheezing.   Cardiovascular: Negative for chest pain, palpitations and leg swelling.  Gastrointestinal: Negative for abdominal pain, diarrhea, nausea and vomiting.  Genitourinary: Negative for dysuria, frequency and urgency.  Musculoskeletal: +left knee pain  Skin: Negative for color change, pallor and rash.  Neurological: Negative for syncope, light-headedness and headaches.  Psychiatric/Behavioral: The patient is not nervous/anxious.       Objective:   Physical Exam Vitals signs and nursing note reviewed.    Constitutional:      General: She is not in acute distress.    Appearance: She is not toxic-appearing.  HENT:     Head: Normocephalic and atraumatic.  Eyes:     General: No scleral icterus. Cardiovascular:     Rate and Rhythm: Normal rate and regular rhythm.     Pulses: Normal pulses.  Pulmonary:     Effort: Pulmonary effort is normal. No respiratory distress.     Breath sounds: Normal breath sounds.  Musculoskeletal:     Left knee: She exhibits normal range of motion, no swelling, no effusion, no deformity and no erythema. Tenderness found.     Right lower leg: No edema.     Left lower leg: No edema.       Legs:     Comments: Location of tenderness indication by red line on diagram. Pain in knee with full extension and full flexion.  Negative anterior posterior drawer test.  Skin:    General: Skin is warm and dry.     Coloration: Skin is not pale.  Neurological:     Mental Status: She is alert and oriented to person, place, and time.  Psychiatric:        Mood and Affect: Mood normal.        Behavior: Behavior normal.    Vitals:   05/03/18 0912  BP: 116/70  Pulse: 70  Temp: 97.8 F (36.6 C)  SpO2: 100%      Assessment & Plan:    Acute  pain of left knee - suspect this is related to osteoarthritis as she has had that similar symptoms occur in her right knee.  We will get x-ray of left knee to further investigate.  Depending on the knee x-ray results, we discussed options of referral back to orthopedic for evaluation and possible joint injection.  Patient will take low-dose steroid taper to help reduce inflammation and pain.  Advised while taking steroid taper she may use Tylenol as needed.  Also suggested she do topical rub such as a Biofreeze or BenGay to see if this helps knee pain in addition to wearing a soft knee brace for added joint support.  Once we have knee x-ray results, we will determine next step in plan of care.  Patient aware she will be contacted with x-ray  results when available.

## 2018-05-06 ENCOUNTER — Encounter: Payer: Self-pay | Admitting: Family Medicine

## 2018-05-06 DIAGNOSIS — M25562 Pain in left knee: Secondary | ICD-10-CM

## 2018-05-14 ENCOUNTER — Other Ambulatory Visit: Payer: Self-pay | Admitting: *Deleted

## 2018-05-14 DIAGNOSIS — F331 Major depressive disorder, recurrent, moderate: Secondary | ICD-10-CM

## 2018-05-14 MED ORDER — DULOXETINE HCL 60 MG PO CPEP: 60.0000 mg | ORAL_CAPSULE | Freq: Every day | ORAL | 1 refills | Status: DC

## 2018-05-14 NOTE — Telephone Encounter (Signed)
Received a refill request from [facility] for Cymbalta 30mg . This dose is not listed as a current dose in chart. Please advise if okay to refill.

## 2018-05-14 NOTE — Telephone Encounter (Signed)
patient is only supposed to be taking 60 mg of Cymbalta daily.  I have sent this refill to her pharmacy.

## 2018-05-16 ENCOUNTER — Telehealth: Payer: Self-pay

## 2018-05-16 DIAGNOSIS — M25569 Pain in unspecified knee: Secondary | ICD-10-CM

## 2018-05-16 NOTE — Telephone Encounter (Signed)
Copied from [place] [id number]. Topic: Referral - Request for Referral >> May 16, 2018  4:15 PM Virl Axe D wrote: Has patient seen PCP for this complaint? Yes *If NO, is insurance requiring patient see PCP for this issue before PCP can refer them? Referral for which specialty: Rheumatology Preferred provider/office: N/A Reason for referral: Knee pain  Pt is very upset that no one is not really looking at the problem. She stated she is severe pain and there is no follow up with what is happening to her.

## 2018-05-16 NOTE — Telephone Encounter (Signed)
Sent to PCP to place referral  

## 2018-05-16 NOTE — Telephone Encounter (Signed)
It would be better for her to see an orthopedist to determine the next step in management. I can place a referral for orthopedics if she wants. It does look like Lauren sent her a mychart message on 05/06/18 regarding placing a referral.

## 2018-05-17 NOTE — Telephone Encounter (Signed)
calle dpt and was unable to leave a VM due to mail box being full.

## 2018-05-19 NOTE — Progress Notes (Signed)
Dana Dunlap  Telephone:(336) [phone] Fax:(336) [phone]  ID: Dana Dunlap OB: [birth year]  MR#: [id number]  WCH#:[id number]  Patient Care Team: Leone Haven, MD as PCP - General (Family Medicine)  CHIEF COMPLAINT: Thrombocytosis, iron deficiency anemia.  INTERVAL HISTORY: Patient returns to clinic today for repeat laboratory work, further evaluation, and consideration of continuation of Feraheme.  She has significant left knee pain, but otherwise feels well.  She continues to have chronic weakness and fatigue. She continues to have heavy menses, but reports her only option at this point is a total hysterectomy which she is refusing. She has no neurologic complaints. She has no chest pain or shortness of breath. She denies any nausea, vomiting, constipation, or diarrhea. She has no melena or hematochezia.  She has no urinary complaints.  Patient offers no further specific complaints today.  REVIEW OF SYSTEMS:   Review of Systems  Constitutional: Positive for malaise/fatigue. Negative for fever and weight loss.  HENT: Negative.  Negative for congestion and sore throat.   Respiratory: Negative.  Negative for cough and shortness of breath.   Cardiovascular: Negative.  Negative for chest pain and leg swelling.  Gastrointestinal: Negative.  Negative for abdominal pain and blood in stool.  Genitourinary: Negative.  Negative for hematuria.  Musculoskeletal: Positive for joint pain.  Skin: Negative.  Negative for rash.  Neurological: Positive for weakness. Negative for focal weakness and headaches.  Psychiatric/Behavioral: Negative for depression. The patient is not nervous/anxious.     As per HPI. Otherwise, a complete review of systems is negative.  PAST MEDICAL HISTORY: Past Medical History:  Diagnosis Date  . Depression    Currently on Cymbalta  . Fibroids   . Frequent headaches    Worse during monthly menstrual cycle  . Hypertension     PAST SURGICAL  HISTORY: Past Surgical History:  Procedure Laterality Date  . BREAST MASS EXCISION     benign    FAMILY HISTORY: Family History  Problem Relation Age of Onset  . Hypertension Mother   . Hyperlipidemia Mother   . Cancer Father        Prostate cancer  . Hypertension Father   . Hyperlipidemia Father   . Stroke Maternal Aunt   . Stroke Paternal Aunt   . Diabetes Paternal Aunt     ADVANCED DIRECTIVES (Y/N):  N  HEALTH MAINTENANCE: Social History   Tobacco Use  . Smoking status: Never Smoker  . Smokeless tobacco: Never Used  Substance Use Topics  . Alcohol use: Yes    Alcohol/week: 1.0 standard drinks    Types: 1 Glasses of wine per week    Comment: occasional alcohol use  . Drug use: No     Colonoscopy:  PAP:  Bone density:  Lipid panel:  Allergies  Allergen Reactions  . Compazine [Prochlorperazine Edisylate] Other (See Comments)    Seizure     Current Outpatient Medications  Medication Sig Dispense Refill  . albuterol (VENTOLIN HFA) 108 (90 Base) MCG/ACT inhaler Inhale 1-2 puffs into the lungs every 6 (six) hours as needed for wheezing or shortness of breath. 1 Inhaler 0  . buPROPion (WELLBUTRIN) 75 MG tablet TAKE ONE TABLET BY MOUTH EVERY MORNING 30 tablet 0  . DULoxetine (CYMBALTA) 60 MG capsule Take 1 capsule (60 mg total) by mouth daily. 90 capsule 1  . ferrous sulfate (FEOSOL) 325 (65 FE) MG tablet Take 1 tablet (325 mg total) by mouth 2 (two) times daily with a meal. 180 tablet 3  .  hydrOXYzine (VISTARIL) 25 MG capsule Take 25 mg by mouth at bedtime as needed (sleep).    . methylPREDNISolone (MEDROL DOSEPAK) 4 MG TBPK tablet Take according to pack instructions 21 tablet 0   No current facility-administered medications for this visit.     OBJECTIVE: Vitals:   05/21/18 1336  BP: (!) 141/76  Pulse: 76  Temp: (!) 97.1 F (36.2 C)     Body mass index is 31.25 kg/m.    ECOG FS:0 - Asymptomatic  General: Well-developed, well-nourished, no acute  distress. Eyes: Pink conjunctiva, anicteric sclera. HEENT: Normocephalic, moist mucous membranes. Lungs: Clear to auscultation bilaterally. Heart: Regular rate and rhythm. No rubs, murmurs, or gallops. Abdomen: Soft, nontender, nondistended. No organomegaly noted, normoactive bowel sounds. Musculoskeletal: No edema, cyanosis, or clubbing. Neuro: Alert, answering all questions appropriately. Cranial nerves grossly intact. Skin: No rashes or petechiae noted. Psych: Normal affect.  LAB RESULTS:  Lab Results  Component Value Date   NA 140 01/23/2018   K 3.8 01/23/2018   CL 111 01/23/2018   CO2 21 (L) 01/23/2018   GLUCOSE 97 01/23/2018   BUN 10 01/23/2018   CREATININE 0.87 01/23/2018   CALCIUM 8.9 01/23/2018   PROT 7.4 01/23/2018   ALBUMIN 3.8 01/23/2018   AST 16 01/23/2018   ALT 13 01/23/2018   ALKPHOS 44 01/23/2018   BILITOT 0.5 01/23/2018   GFRNONAA >60 01/23/2018   GFRAA >60 01/23/2018    Lab Results  Component Value Date   WBC 2.9 (L) 05/21/2018   NEUTROABS 1.3 (L) 05/21/2018   HGB 7.8 (L) 05/21/2018   HCT 26.0 (L) 05/21/2018   MCV 72.0 (L) 05/21/2018   PLT 522 (H) 05/21/2018   Lab Results  Component Value Date   IRON 11 (L) 05/21/2018   TIBC 277 05/21/2018   IRONPCTSAT 4 (L) 05/21/2018   Lab Results  Component Value Date   FERRITIN 10 (L) 05/21/2018     STUDIES: Dg Knee Complete 4 Views Left  Result Date: 05/03/2018 CLINICAL DATA:  Acute on chronic left knee pain without known injury. EXAM: LEFT KNEE - COMPLETE 4+ VIEW COMPARISON:  None. FINDINGS: No evidence of fracture, dislocation, or joint effusion. Moderate narrowing of medial joint space is noted with osteophyte formation. Soft tissues are unremarkable. IMPRESSION: Moderate degenerative joint disease is noted medially. No acute abnormality seen in the left knee. Electronically Signed   By: Marijo Conception, M.D.   On: 05/03/2018 14:34    ASSESSMENT: Thrombocytosis, iron deficiency anemia  PLAN:     1. Thrombocytosis: Patient's platelet count is mildly elevated today.  Likely secondary to iron deficiency.  JAK-2 mutation is negative.  Proceed with IV Feraheme. 2. Iron deficiency anemia: Secondary to heavy fibroid bleeding.  She reports that her only option is a total hysterectomy which she continues to refuse.  Patient's hemoglobin and iron stores continue to be significantly decreased, therefore proceed with 510 mg IV Feraheme today.  Return to clinic in 1 week for second infusion.  Patient will then return to clinic in 2 months with repeat laboratory work and further evaluation.   3. Fibroids: Chronic.  Patient states she is now going to [facility] for further evaluation.   4.  Leukopenia: Chronic and unchanged.  Monitor.  Patient expressed understanding and was in agreement with this plan. She also understands that She can call clinic at any time with any questions, concerns, or complaints.    Lloyd Huger, MD   05/22/2018 6:57 AM

## 2018-05-20 NOTE — Telephone Encounter (Signed)
It is often hard to get into see rheumatology in general.  We can do some other lab work to see if anything for gout or other rheumatological disorders would warrant referral  I can try to refer now, but I am not sure if they will accept referral  I will put in labs for uric acid, ANA, RF

## 2018-05-21 ENCOUNTER — Inpatient Hospital Stay: Payer: 59

## 2018-05-21 ENCOUNTER — Inpatient Hospital Stay (HOSPITAL_BASED_OUTPATIENT_CLINIC_OR_DEPARTMENT_OTHER): Payer: 59 | Admitting: Oncology

## 2018-05-21 ENCOUNTER — Encounter: Payer: Self-pay | Admitting: Oncology

## 2018-05-21 ENCOUNTER — Inpatient Hospital Stay: Payer: 59 | Attending: Oncology

## 2018-05-21 ENCOUNTER — Other Ambulatory Visit: Payer: Self-pay

## 2018-05-21 VITALS — BP 141/76 | HR 76 | Temp 97.1°F | Wt 160.0 lb

## 2018-05-21 DIAGNOSIS — D72819 Decreased white blood cell count, unspecified: Secondary | ICD-10-CM | POA: Diagnosis not present

## 2018-05-21 DIAGNOSIS — M25562 Pain in left knee: Secondary | ICD-10-CM

## 2018-05-21 DIAGNOSIS — D473 Essential (hemorrhagic) thrombocythemia: Secondary | ICD-10-CM | POA: Insufficient documentation

## 2018-05-21 DIAGNOSIS — D508 Other iron deficiency anemias: Secondary | ICD-10-CM

## 2018-05-21 DIAGNOSIS — D500 Iron deficiency anemia secondary to blood loss (chronic): Secondary | ICD-10-CM

## 2018-05-21 DIAGNOSIS — D259 Leiomyoma of uterus, unspecified: Secondary | ICD-10-CM | POA: Insufficient documentation

## 2018-05-21 DIAGNOSIS — D649 Anemia, unspecified: Secondary | ICD-10-CM

## 2018-05-21 LAB — CBC WITH DIFFERENTIAL/PLATELET
Abs Immature Granulocytes: 0 10*3/uL (ref 0.00–0.07)
Basophils Absolute: 0 10*3/uL (ref 0.0–0.1)
Basophils Relative: 1 %
EOS PCT: 5 %
Eosinophils Absolute: 0.2 10*3/uL (ref 0.0–0.5)
HCT: 26 % — ABNORMAL LOW (ref 36.0–46.0)
Hemoglobin: 7.8 g/dL — ABNORMAL LOW (ref 12.0–15.0)
Immature Granulocytes: 0 %
Lymphocytes Relative: 41 %
Lymphs Abs: 1.2 10*3/uL (ref 0.7–4.0)
MCH: 21.6 pg — ABNORMAL LOW (ref 26.0–34.0)
MCHC: 30 g/dL (ref 30.0–36.0)
MCV: 72 fL — ABNORMAL LOW (ref 80.0–100.0)
Monocytes Absolute: 0.3 10*3/uL (ref 0.1–1.0)
Monocytes Relative: 9 %
Neutro Abs: 1.3 10*3/uL — ABNORMAL LOW (ref 1.7–7.7)
Neutrophils Relative %: 44 %
Platelets: 522 10*3/uL — ABNORMAL HIGH (ref 150–400)
RBC: 3.61 MIL/uL — ABNORMAL LOW (ref 3.87–5.11)
RDW: 17.8 % — ABNORMAL HIGH (ref 11.5–15.5)
WBC: 2.9 10*3/uL — ABNORMAL LOW (ref 4.0–10.5)
nRBC: 0 % (ref 0.0–0.2)

## 2018-05-21 LAB — IRON AND TIBC
IRON: 11 ug/dL — AB (ref 28–170)
SATURATION RATIOS: 4 % — AB (ref 10.4–31.8)
TIBC: 277 ug/dL (ref 250–450)
UIBC: 266 ug/dL

## 2018-05-21 LAB — SAMPLE TO BLOOD BANK

## 2018-05-21 LAB — FERRITIN: Ferritin: 10 ng/mL — ABNORMAL LOW (ref 11–307)

## 2018-05-21 MED ORDER — SODIUM CHLORIDE 0.9 % IV SOLN
510.0000 mg | Freq: Once | INTRAVENOUS | Status: AC
  Administered 2018-05-21: 510 mg via INTRAVENOUS
  Filled 2018-05-21: qty 17

## 2018-05-21 MED ORDER — SODIUM CHLORIDE 0.9 % IV SOLN
Freq: Once | INTRAVENOUS | Status: AC
  Administered 2018-05-21: 15:00:00 via INTRAVENOUS
  Filled 2018-05-21: qty 250

## 2018-05-21 NOTE — Progress Notes (Signed)
Patient here today for follow up regarding anemia. Patient reports she continues to have pain in her left knee.

## 2018-05-21 NOTE — Telephone Encounter (Signed)
Called and spoke with patient. Pt would like referral placed still in a lot of pain and having to hobble around.   Place referral for orthopedist

## 2018-05-21 NOTE — Addendum Note (Signed)
Addended by: Leone Haven on: 05/21/2018 02:33 PM   Modules accepted: Orders

## 2018-05-21 NOTE — Telephone Encounter (Signed)
Referral placed.

## 2018-05-27 ENCOUNTER — Inpatient Hospital Stay: Payer: 59

## 2018-05-27 VITALS — BP 130/69 | HR 61 | Temp 97.6°F | Resp 18

## 2018-05-27 DIAGNOSIS — D500 Iron deficiency anemia secondary to blood loss (chronic): Secondary | ICD-10-CM | POA: Diagnosis not present

## 2018-05-27 DIAGNOSIS — D508 Other iron deficiency anemias: Secondary | ICD-10-CM

## 2018-05-27 MED ORDER — SODIUM CHLORIDE 0.9 % IV SOLN
Freq: Once | INTRAVENOUS | Status: AC
  Administered 2018-05-27: 14:00:00 via INTRAVENOUS
  Filled 2018-05-27: qty 250

## 2018-05-27 MED ORDER — SODIUM CHLORIDE 0.9 % IV SOLN
510.0000 mg | Freq: Once | INTRAVENOUS | Status: AC
  Administered 2018-05-27: 510 mg via INTRAVENOUS
  Filled 2018-05-27: qty 17

## 2018-06-16 IMAGING — DX DG KNEE COMPLETE 4+V*R*
5 series · 5 of 5 positions shown · non-contrast
Comparison: None.

CLINICAL DATA: Chronic right knee pain.

EXAM:
RIGHT KNEE - COMPLETE 4+ VIEW

[knee standing ap]
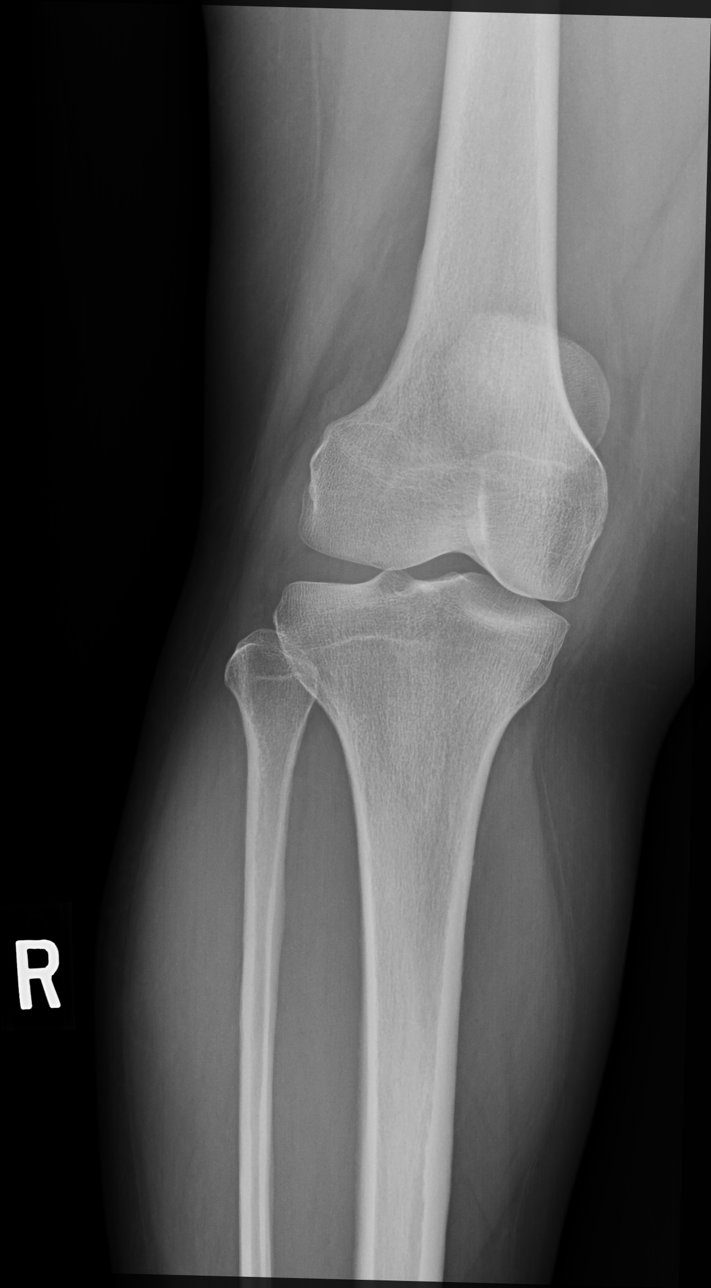

[knee standing external ap]
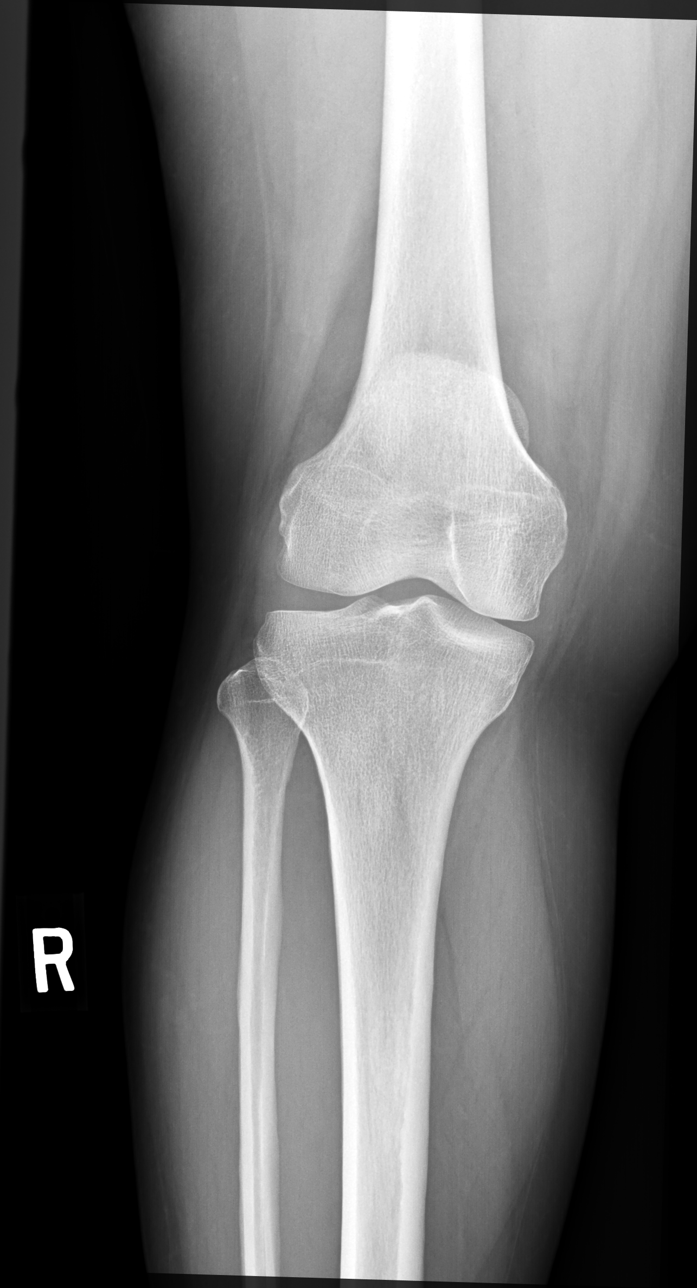

[knee standing internal ap]
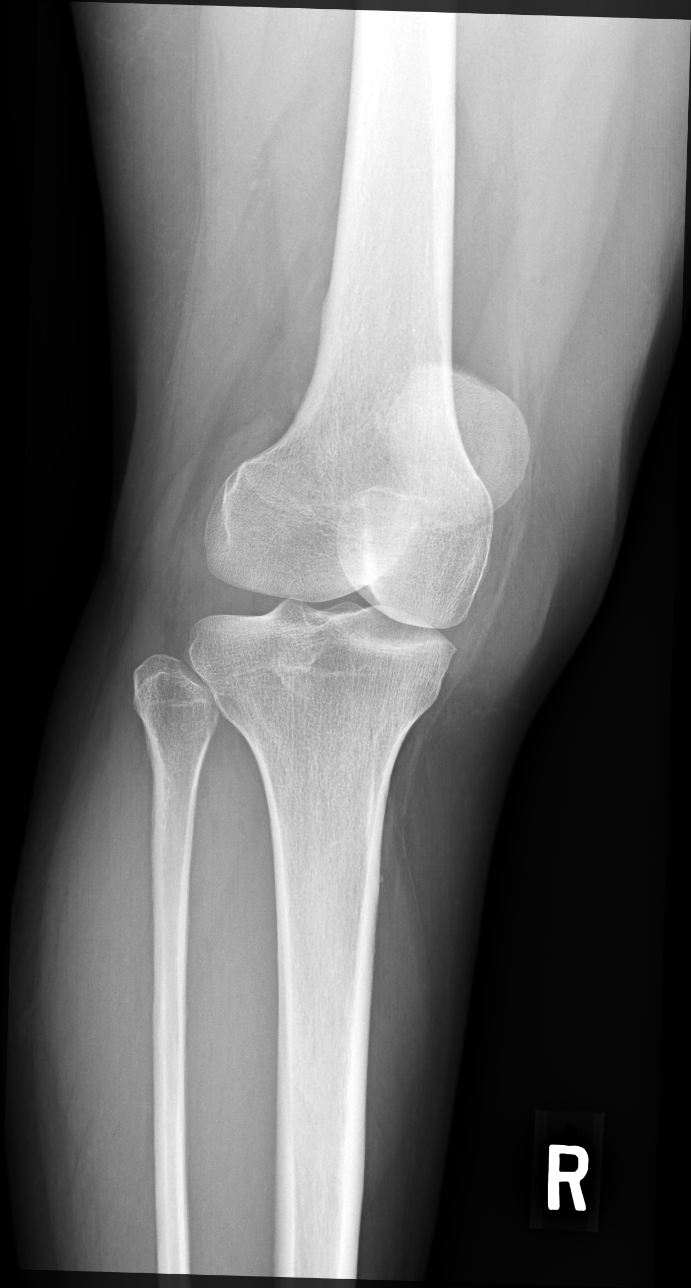

[knee standing lat]
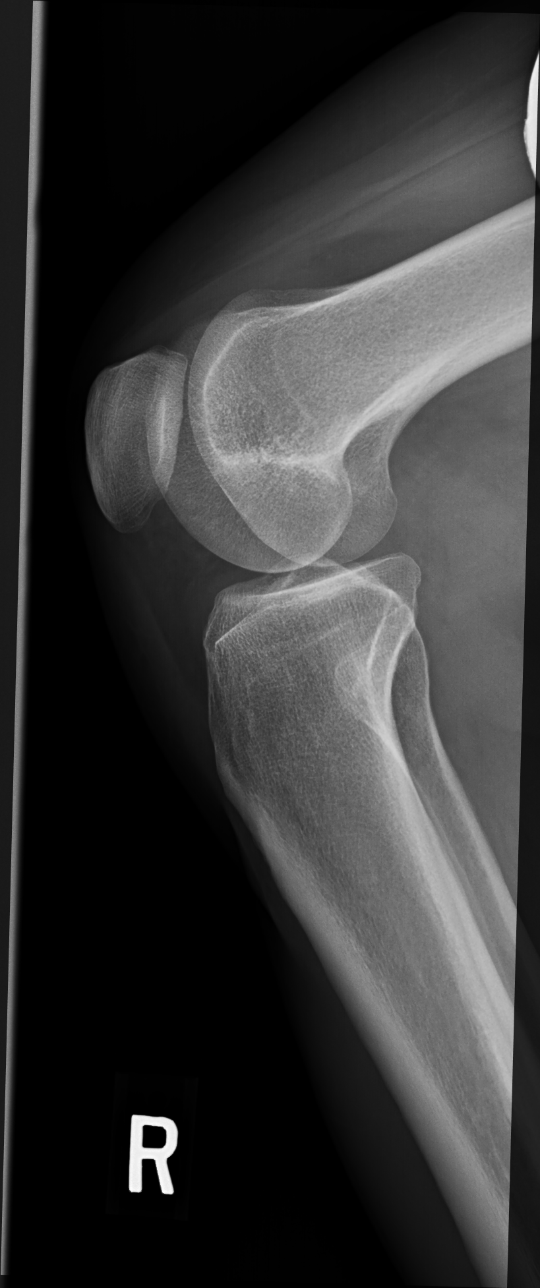

[knee [person_name] view pa]
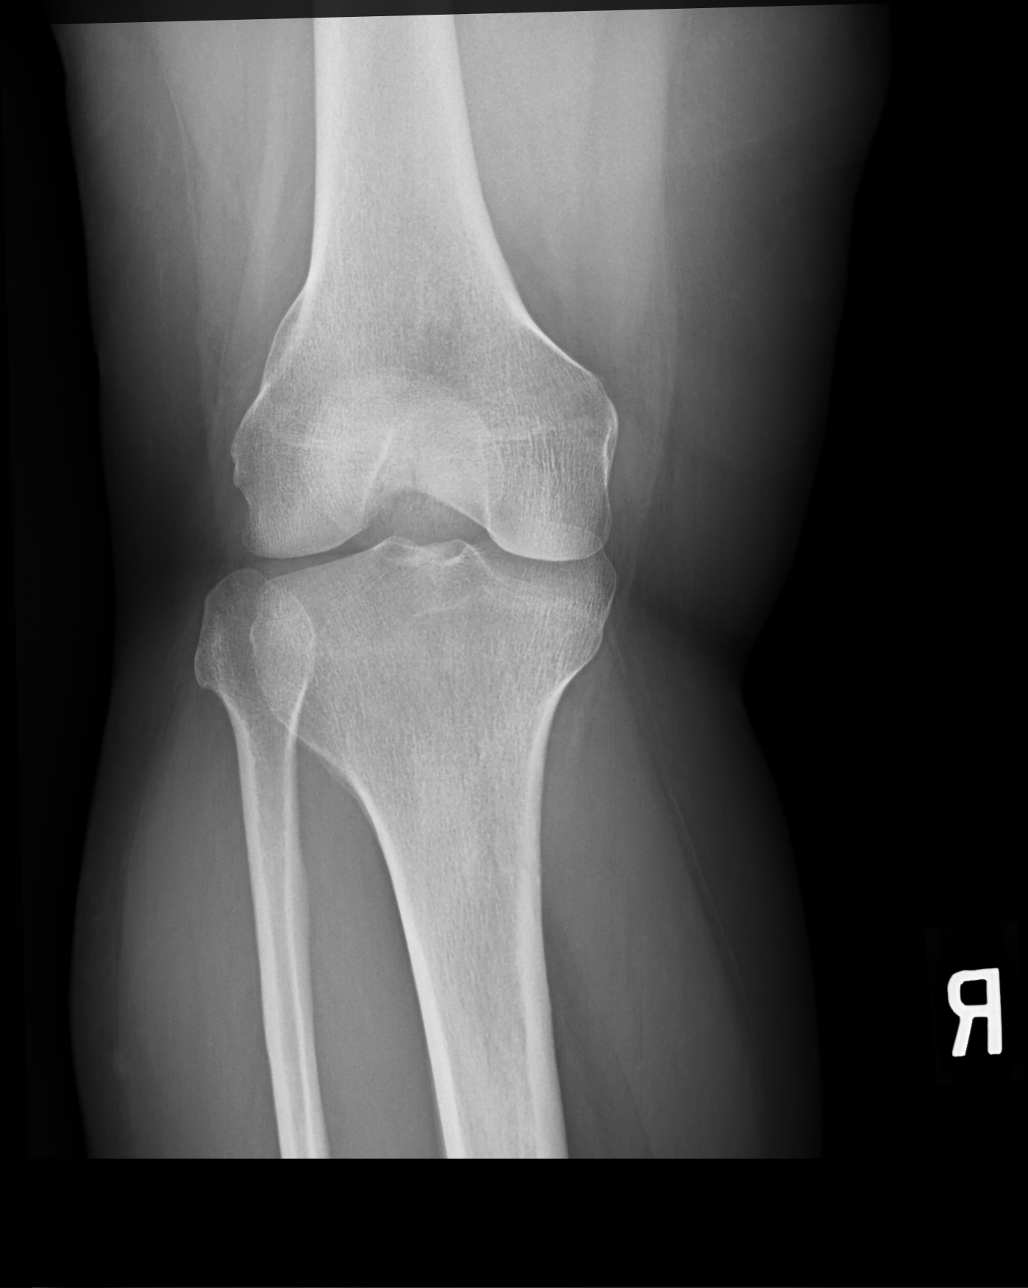

[5 of 5 positions shown; findings below may reference images not displayed]

FINDINGS: No evidence of fracture, dislocation, or joint effusion. No evidence
of arthropathy or other focal bone abnormality. Soft tissues are
unremarkable.
IMPRESSION: Normal right knee.

## 2018-06-26 ENCOUNTER — Telehealth: Payer: Self-pay | Admitting: Family Medicine

## 2018-06-26 NOTE — Telephone Encounter (Signed)
Called and spoke with pt. Pt stated that she does NOT have any symptoms but she did developed a headache and took tylenol OTC thinking it is allergy related. I did advise pt that if she is feeling well she should be fine to go back to work due to not having any symptoms exspecially since she was in [place] for 5 weeks. Symptoms would have shown within the first 2-14 days  Sent to PCP for opinion as well.Marland Kitchen

## 2018-06-26 NOTE — Telephone Encounter (Signed)
Copied from [place] [id number]. Topic: Quick Communication - See Telephone Encounter >> Jun 26, 2018 12:32 PM Vernona Rieger wrote: CRM for notification. See Telephone encounter for: 06/26/18.  Patient states she has been in [place] for the last 5 weeks with her mom at the hospital. She just returned yesterday, 3/24. She is suppose to return to work on April 1st. She called her employer which is [employer] & they advised her to return to work as normal since she has no symptoms. 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 Please Advise.

## 2018-06-27 NOTE — Telephone Encounter (Signed)
Given her lack of symptoms she should be safe to go back to work particularly if her work feels that she is safe to go back to work.  If she develops symptoms she should stay out of work and contact us.

## 2018-06-27 NOTE — Telephone Encounter (Signed)
Called and spoke with pt. Pt advised and voiced understanding.  

## 2018-07-24 ENCOUNTER — Other Ambulatory Visit: Payer: Self-pay | Admitting: Oncology

## 2018-07-24 ENCOUNTER — Encounter: Payer: Self-pay | Admitting: Oncology

## 2018-07-29 ENCOUNTER — Inpatient Hospital Stay: Payer: 59 | Admitting: Oncology

## 2018-07-29 ENCOUNTER — Inpatient Hospital Stay: Payer: 59

## 2018-08-05 DIAGNOSIS — D708 Other neutropenia: Secondary | ICD-10-CM | POA: Insufficient documentation

## 2018-08-05 DIAGNOSIS — M25562 Pain in left knee: Secondary | ICD-10-CM | POA: Insufficient documentation

## 2018-08-05 DIAGNOSIS — G8929 Other chronic pain: Secondary | ICD-10-CM | POA: Diagnosis not present

## 2018-08-30 ENCOUNTER — Other Ambulatory Visit: Payer: Self-pay

## 2018-08-30 NOTE — Progress Notes (Signed)
Dana Dunlap  [phone] [phone]  ID: Dana Dunlap OB: 1970/04/16  MR#: [id number]  UUV#:[id number]  Patient Care Team: Dana Haven, MD as PCP - General (Family Medicine)  CHIEF COMPLAINT: Thrombocytosis, iron deficiency anemia.  INTERVAL HISTORY: Patient returns to clinic today for repeat laboratory work, further evaluation, and continuation of IV iron.  For insurance purposes, her Dana Dunlap has been discontinued and she is now receiving Venofer.  She continues to have chronic weakness and fatigue as well as heavy menses.  She also has continued left knee pain.  She denies any recent fevers or illnesses.  She has no neurologic complaints.  She has no chest pain, shortness of breath, cough, or hemoptysis.  She denies any nausea, vomiting, constipation, or diarrhea. She has no melena or hematochezia.  She has no urinary complaints.  Patient offers no further specific complaints today.  REVIEW OF SYSTEMS:   Review of Systems  Constitutional: Positive for malaise/fatigue. Negative for fever and weight loss.  HENT: Negative.  Negative for congestion and sore throat.   Respiratory: Negative.  Negative for cough and shortness of breath.   Cardiovascular: Negative.  Negative for chest pain and leg swelling.  Gastrointestinal: Negative.  Negative for abdominal pain and blood in stool.  Genitourinary: Negative.  Negative for hematuria.  Musculoskeletal: Positive for joint pain.  Skin: Negative.  Negative for rash.  Neurological: Positive for weakness. Negative for focal weakness and headaches.  Psychiatric/Behavioral: Negative for depression. The patient is not nervous/anxious.     As per HPI. Otherwise, a complete review of systems is negative.  PAST MEDICAL HISTORY: Past Medical History:  Diagnosis Date  . Depression    Currently on Cymbalta  . Fibroids   . Frequent headaches    Worse during monthly menstrual cycle  . Hypertension      PAST SURGICAL HISTORY: Past Surgical History:  Procedure Laterality Date  . BREAST MASS EXCISION     benign    FAMILY HISTORY: Family History  Problem Relation Age of Onset  . Hypertension Mother   . Hyperlipidemia Mother   . Cancer Father        Prostate cancer  . Hypertension Father   . Hyperlipidemia Father   . Stroke Maternal Aunt   . Stroke Paternal Aunt   . Diabetes Paternal Aunt     ADVANCED DIRECTIVES (Y/N):  N  HEALTH MAINTENANCE: Social History   Tobacco Use  . Smoking status: Never Smoker  . Smokeless tobacco: Never Used  Substance Use Topics  . Alcohol use: Yes    Alcohol/week: 1.0 standard drinks    Types: 1 Glasses of wine per week    Comment: occasional alcohol use  . Drug use: No     Colonoscopy:  PAP:  Bone density:  Lipid panel:  Allergies  Allergen Reactions  . Compazine [Prochlorperazine Edisylate] Other (See Comments)    Seizure     Current Outpatient Medications  Medication Sig Dispense Refill  . buPROPion (WELLBUTRIN) 75 MG tablet TAKE ONE TABLET BY MOUTH EVERY MORNING 30 tablet 0  . DULoxetine (CYMBALTA) 60 MG capsule Take 1 capsule (60 mg total) by mouth daily. 90 capsule 1  . ferrous sulfate (FEOSOL) 325 (65 FE) MG tablet Take 1 tablet (325 mg total) by mouth 2 (two) times daily with a meal. 180 tablet 3  . hydrOXYzine (VISTARIL) 25 MG capsule Take 25 mg by mouth at bedtime as needed (sleep).    . meloxicam (MOBIC) 7.5 MG tablet  Take 1 tablet by mouth 1 day or 1 dose.    . albuterol (VENTOLIN HFA) 108 (90 Base) MCG/ACT inhaler Inhale 1-2 puffs into the lungs every 6 (six) hours as needed for wheezing or shortness of breath. (Patient not taking: Reported on 09/02/2018) 1 Inhaler 0   No current facility-administered medications for this visit.     OBJECTIVE: Vitals:   09/02/18 1320  BP: 127/78  Pulse: 78  Temp: 98.7 F (37.1 C)     Body mass index is 30.66 kg/m.    ECOG FS:0 - Asymptomatic  General: Well-developed,  well-nourished, no acute distress. Eyes: Pink conjunctiva, anicteric sclera. HEENT: Normocephalic, moist mucous membranes. Lungs: Clear to auscultation bilaterally. Heart: Regular rate and rhythm. No rubs, murmurs, or gallops. Abdomen: Soft, nontender, nondistended. No organomegaly noted, normoactive bowel sounds. Musculoskeletal: No edema, cyanosis, or clubbing. Neuro: Alert, answering all questions appropriately. Cranial nerves grossly intact. Skin: No rashes or petechiae noted. Psych: Normal affect.  LAB RESULTS:  Lab Results  Component Value Date   NA 140 01/23/2018   K 3.8 01/23/2018   CL 111 01/23/2018   CO2 21 (L) 01/23/2018   GLUCOSE 97 01/23/2018   BUN 10 01/23/2018   CREATININE 0.87 01/23/2018   CALCIUM 8.9 01/23/2018   PROT 7.4 01/23/2018   ALBUMIN 3.8 01/23/2018   AST 16 01/23/2018   ALT 13 01/23/2018   ALKPHOS 44 01/23/2018   BILITOT 0.5 01/23/2018   GFRNONAA >60 01/23/2018   GFRAA >60 01/23/2018    Lab Results  Component Value Date   WBC 2.6 (L) 09/02/2018   NEUTROABS 1.2 (L) 09/02/2018   HGB 8.5 (L) 09/02/2018   HCT 29.5 (L) 09/02/2018   MCV 67.5 (L) 09/02/2018   PLT 425 (H) 09/02/2018   Lab Results  Component Value Date   IRON 12 (L) 09/02/2018   TIBC 339 09/02/2018   IRONPCTSAT 4 (L) 09/02/2018   Lab Results  Component Value Date   FERRITIN 7 (L) 09/02/2018     STUDIES: No results found.  ASSESSMENT: Thrombocytosis, iron deficiency anemia  PLAN:    1. Thrombocytosis: Chronic and unchanged.  Secondary to iron deficiency. JAK-2 mutation is negative.   2. Iron deficiency anemia: Secondary to heavy fibroid bleeding.  She reports that her only option is a total hysterectomy which she continues to refuse.  Patient's hemoglobin has trended up over the past 4 months, but remains significantly decreased.  Her iron stores also remain significantly decreased.  Due to insurance purposes, IV Feraheme was discontinued and she will now receive 200 mg IV  Venofer.  Return to clinic in 1 week for a second infusion.  Patient will then return to clinic in 3 months for further evaluation and continuation of treatment.   3. Fibroids: Chronic.  Patient states she is now going to [facility].   4.  Leukopenia: Chronic and unchanged.  Monitor. 5.  Knee pain: Proceed with steroid injections as per orthopedics.  Patient expressed understanding and was in agreement with this plan. She also understands that She can call clinic at any time with any questions, concerns, or complaints.    Lloyd Huger, MD   09/02/2018 2:51 PM

## 2018-09-02 ENCOUNTER — Encounter: Payer: Self-pay | Admitting: Oncology

## 2018-09-02 ENCOUNTER — Inpatient Hospital Stay: Payer: 59 | Attending: Oncology

## 2018-09-02 ENCOUNTER — Other Ambulatory Visit: Payer: Self-pay

## 2018-09-02 ENCOUNTER — Inpatient Hospital Stay: Payer: 59

## 2018-09-02 ENCOUNTER — Inpatient Hospital Stay (HOSPITAL_BASED_OUTPATIENT_CLINIC_OR_DEPARTMENT_OTHER): Payer: 59 | Admitting: Oncology

## 2018-09-02 VITALS — BP 127/78 | HR 78 | Temp 98.7°F | Wt 157.0 lb

## 2018-09-02 DIAGNOSIS — D72819 Decreased white blood cell count, unspecified: Secondary | ICD-10-CM | POA: Diagnosis not present

## 2018-09-02 DIAGNOSIS — D259 Leiomyoma of uterus, unspecified: Secondary | ICD-10-CM | POA: Insufficient documentation

## 2018-09-02 DIAGNOSIS — M25562 Pain in left knee: Secondary | ICD-10-CM

## 2018-09-02 DIAGNOSIS — D500 Iron deficiency anemia secondary to blood loss (chronic): Secondary | ICD-10-CM

## 2018-09-02 DIAGNOSIS — R7989 Other specified abnormal findings of blood chemistry: Secondary | ICD-10-CM | POA: Insufficient documentation

## 2018-09-02 DIAGNOSIS — D508 Other iron deficiency anemias: Secondary | ICD-10-CM | POA: Diagnosis present

## 2018-09-02 DIAGNOSIS — D649 Anemia, unspecified: Secondary | ICD-10-CM

## 2018-09-02 LAB — SAMPLE TO BLOOD BANK

## 2018-09-02 LAB — CBC WITH DIFFERENTIAL/PLATELET
Abs Immature Granulocytes: 0 10*3/uL (ref 0.00–0.07)
Basophils Absolute: 0.1 10*3/uL (ref 0.0–0.1)
Basophils Relative: 2 %
Eosinophils Absolute: 0.1 10*3/uL (ref 0.0–0.5)
Eosinophils Relative: 3 %
HCT: 29.5 % — ABNORMAL LOW (ref 36.0–46.0)
Hemoglobin: 8.5 g/dL — ABNORMAL LOW (ref 12.0–15.0)
Immature Granulocytes: 0 %
Lymphocytes Relative: 40 %
Lymphs Abs: 1 10*3/uL (ref 0.7–4.0)
MCH: 19.5 pg — ABNORMAL LOW (ref 26.0–34.0)
MCHC: 28.8 g/dL — ABNORMAL LOW (ref 30.0–36.0)
MCV: 67.5 fL — ABNORMAL LOW (ref 80.0–100.0)
Monocytes Absolute: 0.3 10*3/uL (ref 0.1–1.0)
Monocytes Relative: 10 %
Neutro Abs: 1.2 10*3/uL — ABNORMAL LOW (ref 1.7–7.7)
Neutrophils Relative %: 45 %
Platelets: 425 10*3/uL — ABNORMAL HIGH (ref 150–400)
RBC: 4.37 MIL/uL (ref 3.87–5.11)
RDW: 19 % — ABNORMAL HIGH (ref 11.5–15.5)
WBC: 2.6 10*3/uL — ABNORMAL LOW (ref 4.0–10.5)
nRBC: 0 % (ref 0.0–0.2)

## 2018-09-02 LAB — IRON AND TIBC
Iron: 12 ug/dL — ABNORMAL LOW (ref 28–170)
Saturation Ratios: 4 % — ABNORMAL LOW (ref 10.4–31.8)
TIBC: 339 ug/dL (ref 250–450)
UIBC: 327 ug/dL

## 2018-09-02 LAB — FERRITIN: Ferritin: 7 ng/mL — ABNORMAL LOW (ref 11–307)

## 2018-09-02 MED ORDER — IRON SUCROSE 20 MG/ML IV SOLN
200.0000 mg | Freq: Once | INTRAVENOUS | Status: AC
  Administered 2018-09-02: 200 mg via INTRAVENOUS
  Filled 2018-09-02: qty 10

## 2018-09-02 MED ORDER — SODIUM CHLORIDE 0.9 % IV SOLN
Freq: Once | INTRAVENOUS | Status: AC
  Administered 2018-09-02: 14:00:00 via INTRAVENOUS
  Filled 2018-09-02: qty 250

## 2018-09-02 NOTE — Progress Notes (Signed)
Patient scheduled for venofer. Upon starting treatment patient questioned the drug given, says in the past she has "gotten the iron in the bag". Explained to patient that venofer was ordered and explained how it is given as an IV push. Patient questioned treatment plan. I spoke with Dr Grayland Ormond and verified that patient was to receive venofer today.Explained to patient, patient verbalized understandiing to me.

## 2018-09-02 NOTE — Progress Notes (Signed)
Patient has questions in reference to her health and her anemia. Patient stated that she had seen a specialist and was told that she might need a bone marrow to make sure what was her proper diagnosis.

## 2018-09-09 ENCOUNTER — Other Ambulatory Visit: Payer: Self-pay

## 2018-09-09 ENCOUNTER — Inpatient Hospital Stay: Payer: 59

## 2018-09-09 VITALS — BP 116/73 | HR 62 | Temp 98.0°F | Resp 18

## 2018-09-09 DIAGNOSIS — D508 Other iron deficiency anemias: Secondary | ICD-10-CM

## 2018-09-09 MED ORDER — IRON SUCROSE 20 MG/ML IV SOLN
200.0000 mg | Freq: Once | INTRAVENOUS | Status: AC
  Administered 2018-09-09: 200 mg via INTRAVENOUS
  Filled 2018-09-09: qty 10

## 2018-09-09 MED ORDER — SODIUM CHLORIDE 0.9 % IV SOLN
Freq: Once | INTRAVENOUS | Status: AC
  Administered 2018-09-09: 14:00:00 via INTRAVENOUS
  Filled 2018-09-09: qty 250

## 2018-09-09 NOTE — Progress Notes (Signed)
Pt tolerated infusion well. Pt and VS sable at discharge.

## 2018-09-24 ENCOUNTER — Ambulatory Visit: Payer: Self-pay

## 2018-09-24 DIAGNOSIS — M25562 Pain in left knee: Secondary | ICD-10-CM

## 2018-09-24 NOTE — Addendum Note (Signed)
Addended by: Leone Haven on: 09/24/2018 07:49 PM   Modules accepted: Orders

## 2018-09-24 NOTE — Telephone Encounter (Signed)
Returned patient's call.  No answer.  Could not leave message since mailbox was full.

## 2018-09-24 NOTE — Telephone Encounter (Signed)
I have placed a referral to orthopedics.  If her pain is that significant she can try to go to the [facility] urgent care clinic if they are been and she will not need a referral for that.

## 2018-09-24 NOTE — Telephone Encounter (Signed)
Out  Going call to Patient  Pt. Patient complains of severe Pain.Throbbing which has awaken Rated 10 on her left knee.  Reports Swelling of the knee onset began in February. Also states that the knee is hot to the touch.  Denies other Sx.  Aggravating Factors is when Bending the knee.  Was referred to [facility]. " They didnt see me in the office" just sent a medication called Melaxican.  .  Patient would like to see Images .  Does not want injection  Would like a referral to [facility].  Patient is very upset.  Is very proactive and concerned about the health of her knee.  Would like a return call from Provider.             [place] Female, 48 y.o., 1970-08-14 MRN:  [id number] Phone:  [phone] Jerilynn Mages) PCP:  Leone Haven, MD [facility] Other Next Appt With Oncology 12/02/2018 at 1:00 PM Message from Jodie Echevaria sent at 09/24/2018 3:17 PM EDT  Patient called to request a call back due to a pain in her left knee. States that it is a ten on a scale of 1-10. She asked to get a call back to discuss next step Ph# [phone]   Call History   Type Contact  09/24/2018 03:15 PM EDT Phone (Incoming) [name] (Self)  Phone: [phone] (H)  User: Jodie Echevaria    Reason for Disposition . Can't move joint normally (bend and straighten completely)  Answer Assessment - Initial Assessment Questions 1. LOCATION: "Where is the swelling located?"  (e.g., left, right, both knees)    Left knee. Swelling 2. SIZE and DESCRIPTION: "What does the swelling look like?"  (e.g., entire knee, localized)     yes 3. ONSET: "When did the swelling start?" "Does it come and go, or is it there all the time?"     February, constant. 4. PAIN: "Is there any pain?" If so, ask: "How bad is it?" (Scale 1-10; or mild, moderate, severe)     Severe  Throbbing  Wakes U up.  5. SETTING: "Has there been any recent work, exercise or other activity that  involved that part of the body?"      Hot to the touch 6. AGGRAVATING FACTORS: "What makes the knee swelling worse?" (e.g., walking, climbing stairs, running)      When I bend It.   7. ASSOCIATED SYMPTOMS: "Is there any pain or redness?"     Cant tell 8. OTHER SYMPTOMS: "Do you have any other symptoms?" (e.g., chest pain, difficulty breathing, fever, calf pain)     denies 9. PREGNANCY: "Is there any chance you are pregnant?" "When was your last menstrual period?"  Protocols used: KNEE [facility]

## 2018-09-25 NOTE — Telephone Encounter (Signed)
Called patient again.  No answer.  Unable to leave a voice message since voice mailbox is full.

## 2018-09-26 ENCOUNTER — Telehealth: Payer: Self-pay

## 2018-09-26 NOTE — Telephone Encounter (Signed)
Copied from [place] [id number]. Topic: General - Inquiry >> Sep 25, 2018  3:29 PM Scherrie Gerlach wrote: Reason for CRM: pt states the dr at [facility] will not see her without copy of her xray of knee from Jan 2020.  Pt needs to pick up copy asap, b/c her appt is Monday

## 2018-09-27 NOTE — Telephone Encounter (Signed)
Patient needs a copy of her knee xray to pick up before her appt on Monday with [facility].  Taiyo Kozma,cma

## 2018-09-27 NOTE — Telephone Encounter (Signed)
CD burned & placed up front for pick-up. Pt aware also

## 2018-10-09 NOTE — Telephone Encounter (Signed)
Attempted to call patient again to follow-up about knee pain and orthopedic referral.  No answer.  Unable to leave message since voice mailbox was full.  Sent patient a Therapist, music.

## 2018-11-07 ENCOUNTER — Telehealth: Payer: Self-pay

## 2018-11-08 ENCOUNTER — Telehealth: Payer: Self-pay

## 2018-11-08 NOTE — Telephone Encounter (Signed)
Noted. I am happy to look at the MRI results. Please find out who she saw for orthopedics so we can request records from them. Thanks.

## 2018-11-08 NOTE — Telephone Encounter (Signed)
Noted  

## 2018-11-08 NOTE — Telephone Encounter (Signed)
Pt called to speak with Gae Bon who was at lunch. She would like a return call letting her know a good time to stop by the office today to drop off paperwork/MRI information and see Gae Bon for help with crutches. Please advise.

## 2018-11-08 NOTE — Telephone Encounter (Signed)
I called and patient is coming in before 5 pm today to sign a release and get help with crutches.  Julicia Krieger,cma

## 2018-11-08 NOTE — Telephone Encounter (Signed)
Noted. Can we have her sign a release to get the records? Thanks.

## 2018-11-08 NOTE — Telephone Encounter (Signed)
She saw [facility].  Nina,cma

## 2018-11-14 ENCOUNTER — Telehealth: Payer: Self-pay | Admitting: Family Medicine

## 2018-11-14 ENCOUNTER — Other Ambulatory Visit: Payer: Self-pay

## 2018-11-14 NOTE — Telephone Encounter (Signed)
Please call the patient and triage her. If her knee pain is that significant she needs to be evaluated in person and it would likely be best for her to go to the [facility] urgent clinic for evaluation since she is established there for her knee. Please also triage her depression. I have also looked at her MRI result. The bone marrow signal abnormality was present in 2018 on her prior MRI and was felt to be related to reactive bone marrow related to her anemia at that time. We can forward the MRI result to her hematologist for him to review to help determine if any further evaluation is needed for that finding. Thanks.

## 2018-11-14 NOTE — Telephone Encounter (Addendum)
Patient say's Dr. Thornton Park at [facility] took MRI and stated that something going on with her bone marrow. He gave her shot in her knee and nothing helps, she cannot function due to knee pain. She says the pain is rated at a 10. Patient saw [facility] last week at 1, and he advised her to follow up in 6 weeks, she was given crutches and a brace. Patient stated that the " C word has to be ruled out."  She says that you have the report was given to the office last Friday. " " Says she needs a bone scan and completed blood count" Patient says he cannot take the pain any longer it is unbearable... Patient was prescribed Fosamax but this does not help her pain. Patient says her emotional health is bad, she says she called her job, her boss then yelled at her because she is not able to work. "She has been crying for days. Patient repeating over and over no one is helping her there is something wrong with my knee and I cannot take the pain."   Advised patient her pain 10 for 10 she needs to be seen and evaluated immediately at [facility], patient stated she would go now advised hurting that bad she should have someone drive her, patient stated she would.

## 2018-11-14 NOTE — Telephone Encounter (Addendum)
I have attempted to order the nuclear medicine bone scan though there is not a listed diagnosis that I can tie to the order. We can check a CBC in the next week though she has been following with hematology regarding her anemia and low WBC and elevated platelets. She needs to follow-up with hematology as planned later this month. Her MRI findings in her bone marrow are likely related to her chronic iron deficiency anemia, though she may need to see Dr Grayland Ormond to determine if the bone scan is needed. She also has a tear in her meniscus and has arthritic changes that are likely contributing to the pain in her knee. She should be evaluated at [facility] as recommended. Please follow-up with her to make sure she went to get evaluated. We need to have her keep her appointment tomorrow to discuss her depression and her knee.

## 2018-11-14 NOTE — Telephone Encounter (Signed)
Patient decided she did not want to go to [facility] now due to the way she was treated there last week, she wants a referral to a [place], and "wants a African American doctor she feels like a doctor of race will understand  Her body better." " If I were white I could say my hand hurts I would get something for pain without asking."  " If this is Cancer in my knee its going to be bad for everybody concerned." Advised patient to please keep appointment with PCP tomorrow she stated she would , "I am  Sending him along letter to night through my chart telling him how I feel."

## 2018-11-14 NOTE — Telephone Encounter (Signed)
Pt is in extreme pain with her knee. Pt stated she went to the pharmacy and the medication was for bone. Pt states her knee has been injected (DR Lisette Grinder) and keep putting her in rehab. Pt is very upset and crying. Cannot put any pressure its really bad. Pain is at 10!!! Pt feels like no one is listening. Pt is depressed. Please advise? Thank you!

## 2018-11-15 ENCOUNTER — Ambulatory Visit (INDEPENDENT_AMBULATORY_CARE_PROVIDER_SITE_OTHER): Payer: 59 | Admitting: Family Medicine

## 2018-11-15 DIAGNOSIS — S83242A Other tear of medial meniscus, current injury, left knee, initial encounter: Secondary | ICD-10-CM | POA: Diagnosis not present

## 2018-11-15 DIAGNOSIS — M25562 Pain in left knee: Secondary | ICD-10-CM | POA: Diagnosis not present

## 2018-11-15 DIAGNOSIS — F329 Major depressive disorder, single episode, unspecified: Secondary | ICD-10-CM

## 2018-11-15 DIAGNOSIS — D500 Iron deficiency anemia secondary to blood loss (chronic): Secondary | ICD-10-CM

## 2018-11-15 DIAGNOSIS — N920 Excessive and frequent menstruation with regular cycle: Secondary | ICD-10-CM | POA: Diagnosis not present

## 2018-11-15 DIAGNOSIS — F32A Depression, unspecified: Secondary | ICD-10-CM

## 2018-11-15 DIAGNOSIS — F419 Anxiety disorder, unspecified: Secondary | ICD-10-CM

## 2018-11-15 MED ORDER — TRAMADOL HCL 50 MG PO TABS: 50.0000 mg | ORAL_TABLET | Freq: Three times a day (TID) | ORAL | 0 refills | Status: AC | PRN

## 2018-11-15 NOTE — Progress Notes (Signed)
Virtual Visit via video Note  This visit type was conducted due to national recommendations for restrictions regarding the COVID-19 pandemic (e.g. social distancing).  This format is felt to be most appropriate for this patient at this time.  All issues noted in this document were discussed and addressed.  No physical exam was performed (except for noted visual exam findings with Video Visits).   I connected with Dana Dunlap today at  1:45 PM EDT by a video enabled telemedicine application and verified that I am speaking with the correct person using two identifiers. Location patient: home Location provider: work  Persons participating in the virtual visit: patient, provider  I discussed the limitations, risks, security and privacy concerns of performing an evaluation and management service by telephone and the availability of in person appointments. I also discussed with the patient that there may be a patient responsible charge related to this service. The patient expressed understanding and agreed to proceed.   Reason for visit: follow-up - patient has not followed up with PCP since March 2019.  HPI: Left knee pain: This is been going on for a while.  She saw orthopedics and had an injection in her knee about a month ago which was not beneficial.  She notes that her whole knee hurts and it moves around.  Typically hurts more with pressure.  It does lock on her and buckles.  No medications given for pain.  Notes it is 10 out of 10 pain at its worst.  She did physical therapy though the pain came back and she has not continued with physical therapy.  She had an MRI that revealed a radial tear of the posterior horn of her medial meniscus with associated medial extrusion of the body of the medial meniscus.  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  She also has osteoarthritis within the patellofemoral compartment as well.  She was found to have prominent  diffuse bone marrow signal alteration that was present throughout the distal femoral and proximal tibial and fibular metaphysis and diaphysis extending into the proximal aspect of the distal femoral epiphysis.  They noted that although it is possible that this represents unusually prominent red marrow conversion a bone marrow proliferative disorder (such as macrocytosis, myelofibrosis, polycythemia vera, myelodysplastic syndrome, hemosiderosis, leukemia, or Waldenstrm's macroglobulinemia) or diffuse osseous metastatic disease could have this appearance and should be strongly considered.  They recommended a CBC and nuclear medicine bone scan to further evaluate.  The patient had an MRI of her right knee several years ago that revealed similar findings that was felt to be related to hyperactive bone marrow related to her iron deficiency anemia.  The patient does follow with hematology.  Depression: Patient notes she has been more depressed than usual recently though she notes she is dealing with this.  She also notes some anxiety from work.  She continues on Cymbalta and Wellbutrin.  No SI.  Heavy menstrual cycles: She continues to have heavy menstrual cycles.  She wanted to have a particular surgery though she was not a candidate and she declines having a hysterectomy.  She follows with hematology for her iron deficiency anemia.   ROS: See pertinent positives and negatives per HPI.  Past Medical History:  Diagnosis Date  . Depression    Currently on Cymbalta  . Fibroids   . Frequent headaches    Worse during monthly menstrual cycle  . Hypertension     Past Surgical History:  Procedure Laterality Date  .  BREAST MASS EXCISION     benign    Family History  Problem Relation Age of Onset  . Hypertension Mother   . Hyperlipidemia Mother   . Cancer Father        Prostate cancer  . Hypertension Father   . Hyperlipidemia Father   . Stroke Maternal Aunt   . Stroke Paternal Aunt   . Diabetes  Paternal Aunt     SOCIAL HX: Non-smoker.   Current Outpatient Medications:  .  albuterol (VENTOLIN HFA) 108 (90 Base) MCG/ACT inhaler, Inhale 1-2 puffs into the lungs every 6 (six) hours as needed for wheezing or shortness of breath. (Patient not taking: Reported on 09/02/2018), Disp: 1 Inhaler, Rfl: 0 .  buPROPion (WELLBUTRIN) 75 MG tablet, TAKE ONE TABLET BY MOUTH EVERY MORNING, Disp: 30 tablet, Rfl: 0 .  DULoxetine (CYMBALTA) 60 MG capsule, Take 1 capsule (60 mg total) by mouth daily., Disp: 90 capsule, Rfl: 1 .  ferrous sulfate (FEOSOL) 325 (65 FE) MG tablet, Take 1 tablet (325 mg total) by mouth 2 (two) times daily with a meal., Disp: 180 tablet, Rfl: 3 .  hydrOXYzine (VISTARIL) 25 MG capsule, Take 25 mg by mouth at bedtime as needed (sleep)., Disp: , Rfl:  .  meloxicam (MOBIC) 7.5 MG tablet, Take 1 tablet by mouth 1 day or 1 dose., Disp: , Rfl:  .  traMADol (ULTRAM) 50 MG tablet, Take 1 tablet (50 mg total) by mouth every 8 (eight) hours as needed for up to 5 days., Disp: 15 tablet, Rfl: 0  EXAM:  VITALS per patient if applicable: None.  GENERAL: alert, oriented, appears well and in no acute distress  HEENT: atraumatic, conjunttiva clear, no obvious abnormalities on inspection of external nose and ears  NECK: normal movements of the head and neck  LUNGS: on inspection no signs of respiratory distress, breathing rate appears normal, no obvious gross SOB, gasping or wheezing  CV: no obvious cyanosis  MS: moves all visible extremities without noticeable abnormality  PSYCH/NEURO: pleasant and cooperative, no obvious depression or anxiety, speech and thought processing grossly intact  ASSESSMENT AND PLAN:  Discussed the following assessment and plan:  Left knee pain Likely related to osteoarthritis and her meniscal tear.  She would like a referral to a new orthopedic surgeon.  Referral has been placed.  Tramadol for pain.  She denies history of seizures.  Discussed risk of  drowsiness and serotonin syndrome with this medication.  Anemia, iron deficiency She will continue to see hematology.  I will send him a message regarding the bone marrow abnormalities.  She may need a bone marrow biopsy as opposed to a nuclear medicine bone scan.  We will see what her hematologist would like to proceed with.  Excessive and frequent menstruation She continues to have excessive menstruation.  She declines having a hysterectomy.  She would be willing to consider hormonal treatment.  We will refer her to gynecology at [facility] at her request.  Anxiety and depression Continues to have some symptoms.  She will continue on her current regimen.  We will refer her for therapy.    I discussed the assessment and treatment plan with the patient. The patient was provided an opportunity to ask questions and all were answered. The patient agreed with the plan and demonstrated an understanding of the instructions.   The patient was advised to call back or seek an in-person evaluation if the symptoms worsen or if the condition fails to improve as anticipated.  Tommi Rumps, MD

## 2018-11-18 ENCOUNTER — Encounter: Payer: Self-pay | Admitting: Family Medicine

## 2018-11-18 ENCOUNTER — Telehealth: Payer: Self-pay | Admitting: Family Medicine

## 2018-11-18 NOTE — Assessment & Plan Note (Signed)
She continues to have excessive menstruation.  She declines having a hysterectomy.  She would be willing to consider hormonal treatment.  We will refer her to gynecology at [facility] at her request.

## 2018-11-18 NOTE — Assessment & Plan Note (Signed)
Likely related to osteoarthritis and her meniscal tear.  She would like a referral to a new orthopedic surgeon.  Referral has been placed.  Tramadol for pain.  She denies history of seizures.  Discussed risk of drowsiness and serotonin syndrome with this medication.

## 2018-11-18 NOTE — Assessment & Plan Note (Signed)
She will continue to see hematology.  I will send him a message regarding the bone marrow abnormalities.  She may need a bone marrow biopsy as opposed to a nuclear medicine bone scan.  We will see what her hematologist would like to proceed with.

## 2018-11-18 NOTE — Telephone Encounter (Signed)
-----  Message from Lloyd Huger, MD sent at 11/18/2018  4:58 PM EDT ----- Thanks for the heads up Randall Hiss.  I can talk to her about a bone marrow biopsy at her next clinic visit.  Although I suspect with her chronic menses from fibroids and persistent/constant iron deficiency anemia, her bone marrow is unusually active and may appear as though it has a myeloproliferative disorder on imaging.   May be difficult to discern until she has her fibroids addressed and does not have a severe ongoing anemia like she does.  -Tim  ----- Message ----- From: Leone Haven, MD Sent: 11/18/2018   4:52 PM EDT To: Lloyd Huger, MD  Hi Dr Grayland Ormond,   I saw Mrs Duhon for follow-up recently. She had an MRI of her left knee through ortho that revealed prominent diffuse bone marrow signal alteration that was present throughout the distal femoral and proximal tibial and fibular metaphysis and diaphysis extending into the proximal aspect of the distal femoral epiphysis.  They noted that although it is possible that this represents unusually prominent red marrow conversion a bone marrow proliferative disorder (such as macrocytosis, myelofibrosis, polycythemia vera, myelodysplastic syndrome, hemosiderosis, leukemia, or Waldenstrm's macroglobulinemia) or diffuse osseous metastatic disease could have this appearance and should be strongly considered.  They recommended a CBC and nuclear medicine bone scan to further evaluate.   I wanted to see if you thought she may benefit from a bone marrow biopsy or other work up to see if there is an underlying cause other than her chronic anemia. Please let me know what you think. Thanks.  Randall Hiss

## 2018-11-18 NOTE — Telephone Encounter (Signed)
Please let the patient know that I heard back from her hematologist.  He noted he would discuss possible bone marrow biopsy with her at her next visit with him.  He did suspect that her chronic heavy menstrual cycles and persistent iron deficiency anemia was contributing to her findings on imaging as I discussed with her as well.

## 2018-11-18 NOTE — Assessment & Plan Note (Signed)
Continues to have some symptoms.  She will continue on her current regimen.  We will refer her for therapy.

## 2018-11-22 ENCOUNTER — Telehealth: Payer: Self-pay | Admitting: *Deleted

## 2018-11-22 NOTE — Telephone Encounter (Signed)
Copied from [place] [id number]. Topic: Quick Communication - See Telephone Encounter >> Nov 21, 2018  5:13 PM Loma Boston wrote: CRM for notification. See Telephone encounter for: 11/21/18. Pt is calling concerning the [employer] for [employer] who is handling her disability claim. She wants Dr Elliot Cousin. (nurse) to call her back at [phone]. They Dana Dunlap) needs the progress notes of her case for disability # [id number] to be faxed to the [place] at [phone] Additionally,they want a specific date for her return to work. She wants to covey to nurse that she does not want to rush things as she wants this to heal properly. She is concerned about the group pushing her to return to soon reflecting her recovery.

## 2018-11-22 NOTE — Telephone Encounter (Signed)
Patient is calling back to speak to [facility] requesting progress notes for her disability claim.  Her employer has her returning back to work next week. And she is not psychicaly ready to go back to work.  She is requesting Dr. [name] to write her own of work  For as long as possible to recover.  The [employer] is requesting progress notes for her disability claim. [phone].  Please advise CB the patient [phone]

## 2018-11-22 NOTE — Telephone Encounter (Signed)
I called the patient and informed her that she has to come and sign a release of record for her progress notes, patient understood and will be in Monday morning to sign.  Raciel Caffrey,cma

## 2018-11-26 ENCOUNTER — Telehealth: Payer: Self-pay | Admitting: Family Medicine

## 2018-11-26 NOTE — Telephone Encounter (Signed)
The patient dropped off a handwritten letter asking for information to be submitted for short-term and long-term disability.  Typically there is formal paperwork that needs to be filled out and not just having notes submitted.  Please see if she has any formal paperwork regarding this.  Given that she is requesting disability paperwork be filled out she will need to see the specialist for her knee to determine any disability. I do not determine disability and that needs to be determined by her orthopedic specialist.

## 2018-11-27 ENCOUNTER — Telehealth: Payer: Self-pay | Admitting: *Deleted

## 2018-11-27 DIAGNOSIS — D219 Benign neoplasm of connective and other soft tissue, unspecified: Secondary | ICD-10-CM | POA: Insufficient documentation

## 2018-11-27 DIAGNOSIS — D259 Leiomyoma of uterus, unspecified: Secondary | ICD-10-CM | POA: Insufficient documentation

## 2018-11-27 DIAGNOSIS — N939 Abnormal uterine and vaginal bleeding, unspecified: Secondary | ICD-10-CM | POA: Insufficient documentation

## 2018-11-27 NOTE — Telephone Encounter (Signed)
I called and spoke with the patient and informed her to talk with the specialist about her disability and get the paperwork needed. Patient understood.  Nina,cma

## 2018-11-27 NOTE — Telephone Encounter (Signed)
[facility] called reporting that patient had an abnormal MRI scan and they gave her  A copy of scan on CD to bring to her appointment Monday and they will fax a copy of the report to Dr Grayland Ormond today, this was done at the [facility] office in [place] and not available through [place]. They just want to be sure Dr Grayland Ormond is aware.

## 2018-11-28 ENCOUNTER — Other Ambulatory Visit: Payer: Self-pay | Admitting: Family Medicine

## 2018-11-28 ENCOUNTER — Telehealth: Payer: Self-pay | Admitting: *Deleted

## 2018-11-28 DIAGNOSIS — F331 Major depressive disorder, recurrent, moderate: Secondary | ICD-10-CM

## 2018-11-28 NOTE — Telephone Encounter (Signed)
Copied from [place] [id number]. Topic: General - Other >> Nov 28, 2018  1:58 PM Rainey Pines A wrote: Lattie Haw case manager from [place] would like a callback in regards to if a fax received. She sent over an attending physicians statement form.  Best contact number is [phone]

## 2018-11-29 ENCOUNTER — Encounter: Payer: Self-pay | Admitting: Oncology

## 2018-11-29 ENCOUNTER — Other Ambulatory Visit: Payer: Self-pay

## 2018-11-29 NOTE — Telephone Encounter (Signed)
The patent called and left a message for me to call Lattie Haw of [place] [facility]. I called to [employer] and the lady stated they did not have a lisa that works there and I gave her the patients name and I informed her that the patient has had paperwork sent to [place] and Dr. [name] has informed the patient that he does not do short term or any disability and that she has to go to the specialist he referred her to at [place] because they are the ones  that took her out of work.  I explained this to the lady at [facility] group, I gave her the Dr. Name and phone number for her to reach out to them at [place]. She understood.  Romanita Fager,cma

## 2018-11-29 NOTE — Telephone Encounter (Signed)
I called a few days ago and did not document the conversation but the patient stated she was ok with that and she will discuss the findings with her hematologist.  Dana Dunlap

## 2018-11-30 NOTE — Progress Notes (Signed)
[place]  [phone]) [phone] [phone]) [phone]  ID: Dana Dunlap OB: 04-24-1970  MR#: [id number]  OHY#:[id number]  Patient Care Team: Leone Haven, MD as PCP - General (Family Medicine)  CHIEF COMPLAINT: Thrombocytosis, iron deficiency anemia.  INTERVAL HISTORY: Patient returns to clinic today for repeat laboratory work, further evaluation, and continuation of IV Venofer.  She has a torn meniscus in her left knee and is in considerable pain.  She also has increased weakness and fatigue.  She continues to have heavy menstrual bleeding secondary to her fibroids. She denies any recent fevers or illnesses.  She has no neurologic complaints.  She has no chest pain, shortness of breath, cough, or hemoptysis.  She denies any nausea, vomiting, constipation, or diarrhea. She has no melena or hematochezia.  She has no urinary complaints.  Patient offers no further specific complaints today.  REVIEW OF SYSTEMS:   Review of Systems  Constitutional: Positive for malaise/fatigue. Negative for fever and weight loss.  HENT: Negative.  Negative for congestion and sore throat.   Respiratory: Negative.  Negative for cough and shortness of breath.   Cardiovascular: Negative.  Negative for chest pain and leg swelling.  Gastrointestinal: Negative.  Negative for abdominal pain and blood in stool.  Genitourinary: Negative.  Negative for hematuria.  Musculoskeletal: Positive for joint pain.  Skin: Negative.  Negative for rash.  Neurological: Positive for weakness. Negative for focal weakness and headaches.  Psychiatric/Behavioral: Negative for depression. The patient is not nervous/anxious.     As per HPI. Otherwise, a complete review of systems is negative.  PAST MEDICAL HISTORY: Past Medical History:  Diagnosis Date  . Depression    Currently on Cymbalta  . Fibroids   . Frequent headaches    Worse during monthly menstrual cycle  . Hypertension     PAST SURGICAL  HISTORY: Past Surgical History:  Procedure Laterality Date  . BREAST MASS EXCISION     benign    FAMILY HISTORY: Family History  Problem Relation Age of Onset  . Hypertension Mother   . Hyperlipidemia Mother   . Cancer Father        Prostate cancer  . Hypertension Father   . Hyperlipidemia Father   . Stroke Maternal Aunt   . Stroke Paternal Aunt   . Diabetes Paternal Aunt     ADVANCED DIRECTIVES (Y/N):  N  HEALTH MAINTENANCE: Social History   Tobacco Use  . Smoking status: Never Smoker  . Smokeless tobacco: Never Used  Substance Use Topics  . Alcohol use: Yes    Alcohol/week: 1.0 standard drinks    Types: 1 Glasses of wine per week    Comment: occasional alcohol use  . Drug use: No     Colonoscopy:  PAP:  Bone density:  Lipid panel:  Allergies  Allergen Reactions  . Compazine [Prochlorperazine Edisylate] Other (See Comments)    Seizure     Current Outpatient Medications  Medication Sig Dispense Refill  . albuterol (VENTOLIN HFA) 108 (90 Base) MCG/ACT inhaler Inhale 1-2 puffs into the lungs every 6 (six) hours as needed for wheezing or shortness of breath. 1 Inhaler 0  . alendronate (FOSAMAX) 70 MG tablet     . buPROPion (WELLBUTRIN) 75 MG tablet TAKE ONE TABLET BY MOUTH EVERY MORNING 30 tablet 0  . DULoxetine (CYMBALTA) 60 MG capsule TAKE 1 CAPSULE BY MOUTH  DAILY 90 capsule 0  . ferrous sulfate (FEOSOL) 325 (65 FE) MG tablet Take 1 tablet (325 mg total) by mouth  2 (two) times daily with a meal. 180 tablet 3  . hydrOXYzine (VISTARIL) 25 MG capsule Take 25 mg by mouth at bedtime as needed (sleep).    . meloxicam (MOBIC) 7.5 MG tablet Take 1 tablet by mouth 1 day or 1 dose.    . traMADol (ULTRAM) 50 MG tablet      No current facility-administered medications for this visit.    Facility-Administered Medications Ordered in Other Visits  Medication Dose Route Frequency Provider Last Rate Last Dose  . iron sucrose (VENOFER) injection 200 mg  200 mg  Intravenous Once Lloyd Huger, MD        OBJECTIVE: Vitals:   12/02/18 1351  BP: (!) 151/89  Pulse: 84  Resp: 16  Temp: (!) 97.3 F (36.3 C)     Body mass index is 31.52 kg/m.    ECOG FS:0 - Asymptomatic  General: Well-developed, well-nourished, no acute distress. Eyes: Pink conjunctiva, anicteric sclera. HEENT: Normocephalic, moist mucous membranes. Lungs: Clear to auscultation bilaterally. Heart: Regular rate and rhythm. No rubs, murmurs, or gallops. Abdomen: Soft, nontender, nondistended. No organomegaly noted, normoactive bowel sounds. Musculoskeletal: No edema, cyanosis, or clubbing.  Right knee in brace. Neuro: Alert, answering all questions appropriately. Cranial nerves grossly intact. Skin: No rashes or petechiae noted. Psych: Normal affect.  LAB RESULTS:  Lab Results  Component Value Date   NA 140 01/23/2018   K 3.8 01/23/2018   CL 111 01/23/2018   CO2 21 (L) 01/23/2018   GLUCOSE 97 01/23/2018   BUN 10 01/23/2018   CREATININE 0.87 01/23/2018   CALCIUM 8.9 01/23/2018   PROT 7.4 01/23/2018   ALBUMIN 3.8 01/23/2018   AST 16 01/23/2018   ALT 13 01/23/2018   ALKPHOS 44 01/23/2018   BILITOT 0.5 01/23/2018   GFRNONAA >60 01/23/2018   GFRAA >60 01/23/2018    Lab Results  Component Value Date   WBC 3.8 (L) 12/02/2018   NEUTROABS 2.6 12/02/2018   HGB 6.1 (L) 12/02/2018   HCT 22.4 (L) 12/02/2018   MCV 60.1 (L) 12/02/2018   PLT 534 (H) 12/02/2018   Lab Results  Component Value Date   IRON 12 (L) 09/02/2018   TIBC 339 09/02/2018   IRONPCTSAT 4 (L) 09/02/2018   Lab Results  Component Value Date   FERRITIN 7 (L) 09/02/2018     STUDIES: No results found.  ASSESSMENT: Thrombocytosis, iron deficiency anemia  PLAN:    1. Thrombocytosis: Chronic and unchanged.  Secondary to iron deficiency. JAK-2 mutation is negative.   2. Iron deficiency anemia: Secondary to heavy fibroid bleeding.  Patient reports she has an appointment with [facility] in  the near future to discuss hysterectomy.  She expressed understanding that her iron deficiency anemia is a direct result of her bleeding from her fibroids and in order to minimize iron infusions and blood transfusions, this will need to be addressed.  Patient's hemoglobin is 6.1 today.  Proceed with 200 mg IV Venofer today.  Patient will then return to clinic later this week to receive 2 units of packed red blood cells.  Return to clinic in 4 weeks for repeat laboratory work and further evaluation.  3. Fibroids: Chronic.  Continue follow-up per [facility] gynecology.   4.  Leukopenia: Chronic and unchanged.  Monitor. 5.  Knee pain: Patient may require surgery at this point.  Continue treatment per orthopedics.  Case discussed with orthopedics. 6.  Bone marrow: Patient's diffuse bone marrow signal likely is secondary to her severe iron deficiency  anemia.  I suspect her bone marrow is hyperproliferative because of this.  If patient has her fibroids treated and her iron deficiency corrected yet still remains anemic, would consider a bone marrow biopsy at that point.   Patient expressed understanding and was in agreement with this plan. She also understands that She can call clinic at any time with any questions, concerns, or complaints.    Lloyd Huger, MD   12/02/2018 2:59 PM

## 2018-12-01 ENCOUNTER — Encounter: Payer: Self-pay | Admitting: Oncology

## 2018-12-01 NOTE — Progress Notes (Signed)
Patient wanted to know if she could be tested for sickle cell traits and Alpha thalassemia like her siblings. Patient stated that she wanted to make sure what type of infusion she will be receiving since last time she was given infusion, she had symptoms. Patient stated that it made her feel bad and had a heavy menstrual cycle.

## 2018-12-02 ENCOUNTER — Inpatient Hospital Stay: Payer: 59

## 2018-12-02 ENCOUNTER — Other Ambulatory Visit: Payer: Self-pay

## 2018-12-02 ENCOUNTER — Inpatient Hospital Stay: Payer: 59 | Attending: Oncology

## 2018-12-02 ENCOUNTER — Inpatient Hospital Stay (HOSPITAL_BASED_OUTPATIENT_CLINIC_OR_DEPARTMENT_OTHER): Payer: 59 | Admitting: Oncology

## 2018-12-02 VITALS — BP 151/89 | HR 84 | Temp 97.3°F | Resp 16 | Wt 161.4 lb

## 2018-12-02 DIAGNOSIS — R531 Weakness: Secondary | ICD-10-CM | POA: Diagnosis not present

## 2018-12-02 DIAGNOSIS — R5383 Other fatigue: Secondary | ICD-10-CM | POA: Insufficient documentation

## 2018-12-02 DIAGNOSIS — I10 Essential (primary) hypertension: Secondary | ICD-10-CM | POA: Diagnosis not present

## 2018-12-02 DIAGNOSIS — D500 Iron deficiency anemia secondary to blood loss (chronic): Secondary | ICD-10-CM

## 2018-12-02 DIAGNOSIS — D72819 Decreased white blood cell count, unspecified: Secondary | ICD-10-CM | POA: Diagnosis not present

## 2018-12-02 DIAGNOSIS — D649 Anemia, unspecified: Secondary | ICD-10-CM

## 2018-12-02 DIAGNOSIS — R7989 Other specified abnormal findings of blood chemistry: Secondary | ICD-10-CM | POA: Diagnosis not present

## 2018-12-02 DIAGNOSIS — D509 Iron deficiency anemia, unspecified: Secondary | ICD-10-CM | POA: Insufficient documentation

## 2018-12-02 LAB — CBC WITH DIFFERENTIAL/PLATELET
Abs Immature Granulocytes: 0.01 10*3/uL (ref 0.00–0.07)
Basophils Absolute: 0 10*3/uL (ref 0.0–0.1)
Basophils Relative: 1 %
Eosinophils Absolute: 0.1 10*3/uL (ref 0.0–0.5)
Eosinophils Relative: 2 %
HCT: 22.4 % — ABNORMAL LOW (ref 36.0–46.0)
Hemoglobin: 6.1 g/dL — ABNORMAL LOW (ref 12.0–15.0)
Immature Granulocytes: 0 %
Lymphocytes Relative: 21 %
Lymphs Abs: 0.8 10*3/uL (ref 0.7–4.0)
MCH: 16.4 pg — ABNORMAL LOW (ref 26.0–34.0)
MCHC: 27.2 g/dL — ABNORMAL LOW (ref 30.0–36.0)
MCV: 60.1 fL — ABNORMAL LOW (ref 80.0–100.0)
Monocytes Absolute: 0.3 10*3/uL (ref 0.1–1.0)
Monocytes Relative: 7 %
Neutro Abs: 2.6 10*3/uL (ref 1.7–7.7)
Neutrophils Relative %: 69 %
Platelets: 534 10*3/uL — ABNORMAL HIGH (ref 150–400)
RBC: 3.73 MIL/uL — ABNORMAL LOW (ref 3.87–5.11)
RDW: 20.6 % — ABNORMAL HIGH (ref 11.5–15.5)
WBC: 3.8 10*3/uL — ABNORMAL LOW (ref 4.0–10.5)
nRBC: 0 % (ref 0.0–0.2)

## 2018-12-02 LAB — FERRITIN: Ferritin: 4 ng/mL — ABNORMAL LOW (ref 11–307)

## 2018-12-02 LAB — SAMPLE TO BLOOD BANK

## 2018-12-02 LAB — IRON AND TIBC
Iron: 11 ug/dL — ABNORMAL LOW (ref 28–170)
Saturation Ratios: 3 % — ABNORMAL LOW (ref 10.4–31.8)
TIBC: 372 ug/dL (ref 250–450)
UIBC: 361 ug/dL

## 2018-12-02 MED ORDER — IRON SUCROSE 20 MG/ML IV SOLN
200.0000 mg | Freq: Once | INTRAVENOUS | Status: AC
  Administered 2018-12-02: 200 mg via INTRAVENOUS
  Filled 2018-12-02: qty 10

## 2018-12-02 MED ORDER — SODIUM CHLORIDE 0.9 % IV SOLN
Freq: Once | INTRAVENOUS | Status: AC
  Administered 2018-12-02: 15:00:00 via INTRAVENOUS
  Filled 2018-12-02: qty 250

## 2018-12-02 NOTE — Progress Notes (Signed)
Pt here for anemia. She is having pain from her knee and sees ortho for it. She has brought in diesk of knee xray as well as pictures about it. She did not take any pain med due to driving here today , she will take it when she gets home

## 2018-12-04 ENCOUNTER — Other Ambulatory Visit: Payer: Self-pay | Admitting: Nurse Practitioner

## 2018-12-04 ENCOUNTER — Other Ambulatory Visit: Payer: Self-pay

## 2018-12-04 DIAGNOSIS — M1712 Unilateral primary osteoarthritis, left knee: Secondary | ICD-10-CM | POA: Insufficient documentation

## 2018-12-04 DIAGNOSIS — D500 Iron deficiency anemia secondary to blood loss (chronic): Secondary | ICD-10-CM

## 2018-12-04 LAB — PREPARE RBC (CROSSMATCH)

## 2018-12-05 ENCOUNTER — Other Ambulatory Visit: Payer: Self-pay

## 2018-12-05 ENCOUNTER — Inpatient Hospital Stay: Payer: 59 | Attending: Oncology

## 2018-12-05 DIAGNOSIS — D500 Iron deficiency anemia secondary to blood loss (chronic): Secondary | ICD-10-CM | POA: Diagnosis present

## 2018-12-05 MED ORDER — ACETAMINOPHEN 325 MG PO TABS
650.0000 mg | ORAL_TABLET | Freq: Once | ORAL | Status: AC
  Administered 2018-12-05: 650 mg via ORAL
  Filled 2018-12-05: qty 2

## 2018-12-05 MED ORDER — SODIUM CHLORIDE 0.9% IV SOLUTION
250.0000 mL | Freq: Once | INTRAVENOUS | Status: AC
  Administered 2018-12-05: 250 mL via INTRAVENOUS
  Filled 2018-12-05: qty 250

## 2018-12-05 MED ORDER — DIPHENHYDRAMINE HCL 25 MG PO CAPS
25.0000 mg | ORAL_CAPSULE | Freq: Once | ORAL | Status: AC
  Administered 2018-12-05: 25 mg via ORAL
  Filled 2018-12-05: qty 1

## 2018-12-06 ENCOUNTER — Ambulatory Visit: Payer: Self-pay | Admitting: *Deleted

## 2018-12-06 LAB — TYPE AND SCREEN
ABO/RH(D): O POS
Antibody Screen: NEGATIVE
Unit division: 0
Unit division: 0

## 2018-12-06 LAB — BPAM RBC
Blood Product Expiration Date: 202009252359
Blood Product Expiration Date: 202009252359
ISSUE DATE / TIME: 202009030908
ISSUE DATE / TIME: 202009031103
Unit Type and Rh: 5100
Unit Type and Rh: 5100

## 2018-12-06 NOTE — Telephone Encounter (Signed)
Scheduled next available patient could keep 12/10/18

## 2018-12-06 NOTE — Telephone Encounter (Signed)
Patient is calling with concerns of high BP- she has had several recent appointments with different doctors and she has been told that her BP is high. Patient does not have a way to check it at home- and her last visit in chart is recorded at 131/81. Call to office- they are going to see her next week.  Reason for Disposition . [id number] Systolic BP  >= [id number] OR Diastolic >= 80 AND [id number] taking BP medications  Answer Assessment - Initial Assessment Questions 1. BLOOD PRESSURE: "What is the blood pressure?" "Did you take at least two measurements 5 minutes apart?"     148/99 2. ONSET: "When did you take your blood pressure?"     Yesterday- patient had to go for blood transfusion 3. HOW: "How did you obtain the blood pressure?" (e.g., visiting nurse, automatic home BP monitor)     Manual BP 4. HISTORY: "Do you have a history of high blood pressure?"     Yes- patient had stopped pills years ago 5. MEDICATIONS: "Are you taking any medications for blood pressure?" "Have you missed any doses recently?"     No medication now 6. OTHER SYMPTOMS: "Do you have any symptoms?" (e.g., headache, chest pain, blurred vision, difficulty breathing, weakness)     Fatigue- patient is anemic  7. PREGNANCY: "Is there any chance you are pregnant?" "When was your last menstrual period?"     n/a  Protocols used: HIGH BLOOD PRESSURE-A-AH

## 2018-12-06 NOTE — Telephone Encounter (Signed)
I think that is reasonable to see her then.

## 2018-12-10 ENCOUNTER — Other Ambulatory Visit: Payer: Self-pay

## 2018-12-10 ENCOUNTER — Encounter: Payer: Self-pay | Admitting: Family Medicine

## 2018-12-10 ENCOUNTER — Ambulatory Visit: Payer: 59 | Admitting: Family Medicine

## 2018-12-10 VITALS — BP 110/70 | HR 70 | Temp 98.0°F | Ht 60.0 in | Wt 160.4 lb

## 2018-12-10 DIAGNOSIS — M25562 Pain in left knee: Secondary | ICD-10-CM | POA: Diagnosis not present

## 2018-12-10 DIAGNOSIS — R030 Elevated blood-pressure reading, without diagnosis of hypertension: Secondary | ICD-10-CM | POA: Diagnosis not present

## 2018-12-10 DIAGNOSIS — R8290 Unspecified abnormal findings in urine: Secondary | ICD-10-CM

## 2018-12-10 LAB — POCT URINALYSIS DIPSTICK
Glucose, UA: NEGATIVE
Leukocytes, UA: NEGATIVE
Nitrite, UA: NEGATIVE
Protein, UA: POSITIVE — AB
Spec Grav, UA: 1.025 (ref 1.010–1.025)
Urobilinogen, UA: 1 E.U./dL
pH, UA: 5.5 (ref 5.0–8.0)

## 2018-12-10 NOTE — Patient Instructions (Signed)
Nice to see you. We will check labs today.  We will have you return in 2 weeks for a BP check. Please bring your cuff to compare.  If your BP trends up above the 150s/90s at home please contact us sooner.

## 2018-12-11 ENCOUNTER — Telehealth: Payer: Self-pay | Admitting: *Deleted

## 2018-12-11 ENCOUNTER — Other Ambulatory Visit: Payer: Self-pay | Admitting: *Deleted

## 2018-12-11 ENCOUNTER — Inpatient Hospital Stay: Payer: 59

## 2018-12-11 ENCOUNTER — Other Ambulatory Visit: Payer: Self-pay

## 2018-12-11 DIAGNOSIS — D500 Iron deficiency anemia secondary to blood loss (chronic): Secondary | ICD-10-CM

## 2018-12-11 DIAGNOSIS — R319 Hematuria, unspecified: Secondary | ICD-10-CM

## 2018-12-11 LAB — URINALYSIS, COMPLETE (UACMP) WITH MICROSCOPIC
Specific Gravity, Urine: 1.006 (ref 1.005–1.030)
Squamous Epithelial / HPF: NONE SEEN (ref 0–5)

## 2018-12-11 LAB — COMPREHENSIVE METABOLIC PANEL
ALT: 11 IU/L (ref 0–32)
ALT: 14 U/L (ref 0–44)
AST: 24 IU/L (ref 0–40)
AST: 45 U/L — ABNORMAL HIGH (ref 15–41)
Albumin/Globulin Ratio: 1.6 (ref 1.2–2.2)
Albumin: 4 g/dL (ref 3.8–4.8)
Albumin: 3.8 g/dL (ref 3.5–5.0)
Alkaline Phosphatase: 49 IU/L (ref 39–117)
Alkaline Phosphatase: 44 U/L (ref 38–126)
Anion gap: 6 (ref 5–15)
BUN/Creatinine Ratio: 10 (ref 9–23)
BUN: 10 mg/dL (ref 6–24)
BUN: 16 mg/dL (ref 6–20)
Bilirubin Total: 0.9 mg/dL (ref 0.0–1.2)
CO2: 23 mmol/L (ref 20–29)
CO2: 28 mmol/L (ref 22–32)
Calcium: 9.3 mg/dL (ref 8.7–10.2)
Calcium: 9.4 mg/dL (ref 8.9–10.3)
Chloride: 104 mmol/L (ref 96–106)
Chloride: 107 mmol/L (ref 98–111)
Creatinine, Ser: 1.03 mg/dL — ABNORMAL HIGH (ref 0.57–1.00)
Creatinine, Ser: 1.03 mg/dL — ABNORMAL HIGH (ref 0.44–1.00)
GFR calc Af Amer: 73 mL/min/{1.73_m2} (ref >59)
GFR calc Af Amer: >60 mL/min (ref >60)
GFR calc non Af Amer: 64 mL/min/{1.73_m2} (ref >59)
GFR calc non Af Amer: >60 mL/min (ref >60)
Globulin, Total: 2.5 g/dL (ref 1.5–4.5)
Glucose, Bld: 125 mg/dL — ABNORMAL HIGH (ref 70–99)
Glucose: 95 mg/dL (ref 65–99)
Potassium: 4.4 mmol/L (ref 3.5–5.2)
Potassium: 3.9 mmol/L (ref 3.5–5.1)
Sodium: 141 mmol/L (ref 134–144)
Sodium: 141 mmol/L (ref 135–145)
Total Bilirubin: 2.2 mg/dL — ABNORMAL HIGH (ref 0.3–1.2)
Total Protein: 6.5 g/dL (ref 6.0–8.5)
Total Protein: 7.1 g/dL (ref 6.5–8.1)

## 2018-12-11 LAB — CBC WITH DIFFERENTIAL/PLATELET
Abs Immature Granulocytes: 0.61 10*3/uL — ABNORMAL HIGH (ref 0.00–0.07)
Basophils Absolute: 0.1 10*3/uL (ref 0.0–0.1)
Basophils Relative: 1 %
Eosinophils Absolute: 0.1 10*3/uL (ref 0.0–0.5)
Eosinophils Relative: 1 %
HCT: 29.7 % — ABNORMAL LOW (ref 36.0–46.0)
Hemoglobin: 8.4 g/dL — ABNORMAL LOW (ref 12.0–15.0)
Immature Granulocytes: 7 %
Lymphocytes Relative: 23 %
Lymphs Abs: 2 10*3/uL (ref 0.7–4.0)
MCH: 18.7 pg — ABNORMAL LOW (ref 26.0–34.0)
MCHC: 28.3 g/dL — ABNORMAL LOW (ref 30.0–36.0)
MCV: 66.1 fL — ABNORMAL LOW (ref 80.0–100.0)
Monocytes Absolute: 0.8 10*3/uL (ref 0.1–1.0)
Monocytes Relative: 9 %
Neutro Abs: 5.1 10*3/uL (ref 1.7–7.7)
Neutrophils Relative %: 59 %
Platelets: 422 10*3/uL — ABNORMAL HIGH (ref 150–400)
RBC: 4.49 MIL/uL (ref 3.87–5.11)
RDW: 27.2 % — ABNORMAL HIGH (ref 11.5–15.5)
Smear Review: ADEQUATE
WBC: 8.6 10*3/uL (ref 4.0–10.5)
nRBC: 0.7 % — ABNORMAL HIGH (ref 0.0–0.2)

## 2018-12-11 LAB — LIPID PANEL
Chol/HDL Ratio: 3.7 ratio (ref 0.0–4.4)
Cholesterol, Total: 194 mg/dL (ref 100–199)
HDL: 53 mg/dL (ref >39)
LDL Chol Calc (NIH): 124 mg/dL — ABNORMAL HIGH (ref 0–99)
Triglycerides: 94 mg/dL (ref 0–149)
VLDL Cholesterol Cal: 17 mg/dL (ref 5–40)

## 2018-12-11 LAB — TSH: TSH: 1.6 u[IU]/mL (ref 0.450–4.500)

## 2018-12-11 LAB — CBC
Hematocrit: 32.8 % — ABNORMAL LOW (ref 34.0–46.6)
Hemoglobin: 9.2 g/dL — ABNORMAL LOW (ref 11.1–15.9)
MCH: 19.2 pg — ABNORMAL LOW (ref 26.6–33.0)
MCHC: 28 g/dL — ABNORMAL LOW (ref 31.5–35.7)
MCV: 68 fL — ABNORMAL LOW (ref 79–97)
Platelets: 507 10*3/uL — ABNORMAL HIGH (ref 150–450)
RBC: 4.8 x10E6/uL (ref 3.77–5.28)
RDW: 26 % — ABNORMAL HIGH (ref 11.7–15.4)
WBC: 7.6 10*3/uL (ref 3.4–10.8)

## 2018-12-11 LAB — URINALYSIS, MICROSCOPIC ONLY: Casts: NONE SEEN /lpf

## 2018-12-11 NOTE — Telephone Encounter (Signed)
Patient called reporting that since her blood transfusion Thursday, her urine has been dark, but she awoke this morning and her urine is bright red. She went to see her GYN, but they do not want to treat her since it is related to her transfusion and told her to call us. Please advise

## 2018-12-11 NOTE — Telephone Encounter (Signed)
Patient hgb dropped from 9.2 yesterday to 8.4 today, physician informed and ordered forpt to come in in morning for additional labs CBC, Hold tube, LDH, DAT, Haptoglobin, METC and to see [facility]. Patient accepts appointment for 845 tomorrow

## 2018-12-11 NOTE — Telephone Encounter (Signed)
Dana Dunlap - i'll put orders in if you will call pt with lab appt

## 2018-12-11 NOTE — Telephone Encounter (Signed)
Patient coming at 75

## 2018-12-11 NOTE — Telephone Encounter (Signed)
That makes no sense, but have her come in for a lab check. Cbc, metc, u/a, xtra tube.  Thanks!

## 2018-12-12 ENCOUNTER — Other Ambulatory Visit: Payer: Self-pay

## 2018-12-12 ENCOUNTER — Inpatient Hospital Stay (HOSPITAL_BASED_OUTPATIENT_CLINIC_OR_DEPARTMENT_OTHER): Payer: 59 | Admitting: Nurse Practitioner

## 2018-12-12 ENCOUNTER — Encounter: Payer: Self-pay | Admitting: Nurse Practitioner

## 2018-12-12 ENCOUNTER — Inpatient Hospital Stay: Payer: 59

## 2018-12-12 VITALS — BP 143/88 | HR 70 | Temp 98.6°F | Resp 14 | Wt 159.0 lb

## 2018-12-12 DIAGNOSIS — R319 Hematuria, unspecified: Secondary | ICD-10-CM

## 2018-12-12 DIAGNOSIS — T80911A Delayed hemolytic transfusion reaction, unspecified incompatibility, initial encounter: Secondary | ICD-10-CM | POA: Insufficient documentation

## 2018-12-12 DIAGNOSIS — D500 Iron deficiency anemia secondary to blood loss (chronic): Secondary | ICD-10-CM | POA: Diagnosis not present

## 2018-12-12 LAB — CBC WITH DIFFERENTIAL/PLATELET
Abs Immature Granulocytes: 0.48 10*3/uL — ABNORMAL HIGH (ref 0.00–0.07)
Basophils Absolute: 0.1 10*3/uL (ref 0.0–0.1)
Basophils Relative: 1 %
Eosinophils Absolute: 0.1 10*3/uL (ref 0.0–0.5)
Eosinophils Relative: 1 %
HCT: 27.2 % — ABNORMAL LOW (ref 36.0–46.0)
Hemoglobin: 7.7 g/dL — ABNORMAL LOW (ref 12.0–15.0)
Immature Granulocytes: 6 %
Lymphocytes Relative: 27 %
Lymphs Abs: 2.2 10*3/uL (ref 0.7–4.0)
MCH: 18 pg — ABNORMAL LOW (ref 26.0–34.0)
MCHC: 28.3 g/dL — ABNORMAL LOW (ref 30.0–36.0)
MCV: 63.6 fL — ABNORMAL LOW (ref 80.0–100.0)
Monocytes Absolute: 0.9 10*3/uL (ref 0.1–1.0)
Monocytes Relative: 11 %
Neutro Abs: 4.4 10*3/uL (ref 1.7–7.7)
Neutrophils Relative %: 54 %
Platelets: 368 10*3/uL (ref 150–400)
RBC: 4.28 MIL/uL (ref 3.87–5.11)
RDW: 25.4 % — ABNORMAL HIGH (ref 11.5–15.5)
Smear Review: ADEQUATE
WBC: 8.2 10*3/uL (ref 4.0–10.5)
nRBC: 0.7 % — ABNORMAL HIGH (ref 0.0–0.2)

## 2018-12-12 LAB — COMPREHENSIVE METABOLIC PANEL
ALT: 14 U/L (ref 0–44)
AST: 59 U/L — ABNORMAL HIGH (ref 15–41)
Albumin: 3.7 g/dL (ref 3.5–5.0)
Alkaline Phosphatase: 43 U/L (ref 38–126)
Anion gap: 7 (ref 5–15)
BUN: 15 mg/dL (ref 6–20)
CO2: 26 mmol/L (ref 22–32)
Calcium: 9.2 mg/dL (ref 8.9–10.3)
Chloride: 105 mmol/L (ref 98–111)
Creatinine, Ser: 1.05 mg/dL — ABNORMAL HIGH (ref 0.44–1.00)
GFR calc Af Amer: >60 mL/min (ref >60)
GFR calc non Af Amer: >60 mL/min (ref >60)
Glucose, Bld: 131 mg/dL — ABNORMAL HIGH (ref 70–99)
Potassium: 4.2 mmol/L (ref 3.5–5.1)
Sodium: 138 mmol/L (ref 135–145)
Total Bilirubin: 2.3 mg/dL — ABNORMAL HIGH (ref 0.3–1.2)
Total Protein: 7 g/dL (ref 6.5–8.1)

## 2018-12-12 LAB — LACTATE DEHYDROGENASE: LDH: 1273 U/L — ABNORMAL HIGH (ref 98–192)

## 2018-12-12 LAB — TRANSFUSION REACTION
DAT C3: NEGATIVE
Post RXN DAT IgG: NEGATIVE

## 2018-12-12 LAB — DAT, POLYSPECIFIC AHG (ARMC ONLY): Polyspecific AHG test: NEGATIVE

## 2018-12-12 LAB — SAMPLE TO BLOOD BANK

## 2018-12-12 MED ORDER — PREDNISONE 10 MG (21) PO TBPK: ORAL_TABLET | ORAL | 0 refills | Status: DC

## 2018-12-12 NOTE — Patient Instructions (Signed)
Prednisone tablets What is this medicine? PREDNISONE (PRED ni sone) is a corticosteroid. It is commonly used to treat inflammation of the skin, joints, lungs, and other organs. Common conditions treated include asthma, allergies, and arthritis. It is also used for other conditions, such as blood disorders and diseases of the adrenal glands. This medicine may be used for other purposes; ask your health care provider or pharmacist if you have questions. COMMON BRAND NAME(S): Deltasone, Predone, Sterapred, Sterapred DS What should I tell my health care provider before I take this medicine? They need to know if you have any of these conditions:  Cushing's syndrome  diabetes  glaucoma  heart disease  high blood pressure  infection (especially a virus infection such as chickenpox, cold sores, or herpes)  kidney disease  liver disease  mental illness  myasthenia gravis  osteoporosis  seizures  stomach or intestine problems  thyroid disease  an unusual or allergic reaction to lactose, prednisone, other medicines, foods, dyes, or preservatives  pregnant or trying to get pregnant  breast-feeding How should I use this medicine? Take this medicine by mouth with a glass of water. Follow the directions on the prescription label. Take this medicine with food. If you are taking this medicine once a day, take it in the morning. Do not take more medicine than you are told to take. Do not suddenly stop taking your medicine because you may develop a severe reaction. Your doctor will tell you how much medicine to take. If your doctor wants you to stop the medicine, the dose may be slowly lowered over time to avoid any side effects. Talk to your pediatrician regarding the use of this medicine in children. Special care may be needed. Overdosage: If you think you have taken too much of this medicine contact a poison control center or emergency room at once. NOTE: This medicine is only for you.  Do not share this medicine with others. What if I miss a dose? If you miss a dose, take it as soon as you can. If it is almost time for your next dose, talk to your doctor or health care professional. You may need to miss a dose or take an extra dose. Do not take double or extra doses without advice. What may interact with this medicine? Do not take this medicine with any of the following medications:  metyrapone  mifepristone This medicine may also interact with the following medications:  aminoglutethimide  amphotericin B  aspirin and aspirin-like medicines  barbiturates  certain medicines for diabetes, like glipizide or glyburide  cholestyramine  cholinesterase inhibitors  cyclosporine  digoxin  diuretics  ephedrine  female hormones, like estrogens and birth control pills  isoniazid  ketoconazole  NSAIDS, medicines for pain and inflammation, like ibuprofen or naproxen  phenytoin  rifampin  toxoids  vaccines  warfarin This list may not describe all possible interactions. Give your health care provider a list of all the medicines, herbs, non-prescription drugs, or dietary supplements you use. Also tell them if you smoke, drink alcohol, or use illegal drugs. Some items may interact with your medicine. What should I watch for while using this medicine? Visit your doctor or health care professional for regular checks on your progress. If you are taking this medicine over a prolonged period, carry an identification card with your name and address, the type and dose of your medicine, and your doctor's name and address. This medicine may increase your risk of getting an infection. Tell your doctor or   health care professional if you are around anyone with measles or chickenpox, or if you develop sores or blisters that do not heal properly. If you are going to have surgery, tell your doctor or health care professional that you have taken this medicine within the last  twelve months. Ask your doctor or health care professional about your diet. You may need to lower the amount of salt you eat. This medicine may increase blood sugar. Ask your healthcare provider if changes in diet or medicines are needed if you have diabetes. What side effects may I notice from receiving this medicine? Side effects that you should report to your doctor or health care professional as soon as possible:  allergic reactions like skin rash, itching or hives, swelling of the face, lips, or tongue  changes in emotions or moods  changes in vision  depressed mood  eye pain  fever or chills, cough, sore throat, pain or difficulty passing urine  signs and symptoms of high blood sugar such as being more thirsty or hungry or having to urinate more than normal. You may also feel very tired or have blurry vision.  swelling of ankles, feet Side effects that usually do not require medical attention (report to your doctor or health care professional if they continue or are bothersome):  confusion, excitement, restlessness  headache  nausea, vomiting  skin problems, acne, thin and shiny skin  trouble sleeping  weight gain This list may not describe all possible side effects. Call your doctor for medical advice about side effects. You may report side effects to FDA at 1-800-FDA-1088. Where should I keep my medicine? Keep out of the reach of children. Store at room temperature between 15 and 30 degrees C (59 and 86 degrees F). Protect from light. Keep container tightly closed. Throw away any unused medicine after the expiration date. NOTE: This sheet is a summary. It may not cover all possible information. If you have questions about this medicine, talk to your doctor, pharmacist, or health care provider.  2020 Elsevier/Gold Standard (2017-12-18 10:54:22)  

## 2018-12-12 NOTE — Progress Notes (Signed)
  Tommi Rumps, MD Phone: [phone]  Dana Dunlap is a 48 y.o. female who presents today for follow-up.  Elevated blood pressure: Patient notes over the last week her blood pressures been ranging in the 140s-150s/90s.  She notes she has been moving around a lot to go to doctor's appointments and has had increased left knee pain related to this.  She notes no chest pain, shortness of breath, or edema.  Left knee pain: She notes this has been going well.  She saw orthopedics at [facility] and they are trying to get her a specialized brace that will take some pressure off of her knee.  They are getting her into the pain clinic there as well to see if she would benefit from Synvisc injections.  She does continue to have pain.  Social History   Tobacco Use  Smoking Status Never Smoker  Smokeless Tobacco Never Used     ROS see history of present illness  Objective  Physical Exam Vitals:   12/10/18 1553  BP: 110/70  Pulse: 70  Temp: 98 F (36.7 C)  SpO2: 98%    BP Readings from Last 3 Encounters:  12/10/18 110/70  12/05/18 131/81  12/02/18 (!) 151/89   Wt Readings from Last 3 Encounters:  12/10/18 160 lb 6.4 oz (72.8 kg)  12/02/18 161 lb 6.4 oz (73.2 kg)  09/02/18 157 lb (71.2 kg)    Physical Exam Constitutional:      General: She is not in acute distress.    Appearance: She is not diaphoretic.  Cardiovascular:     Rate and Rhythm: Normal rate and regular rhythm.     Heart sounds: Normal heart sounds.  Pulmonary:     Effort: Pulmonary effort is normal.     Breath sounds: Normal breath sounds.  Musculoskeletal:     Right lower leg: No edema.     Left lower leg: No edema.     Comments: Left knee with brace on, there is no tenderness of the joint lines of the left knee  Skin:    General: Skin is warm and dry.  Neurological:     Mental Status: She is alert.      Assessment/Plan: Please see individual problem list.  Elevated blood pressure reading This has  occurred in the setting of her knee pain.  I believe that was driving her blood pressure to be more elevated.  Her blood pressure is very well controlled today.  She will check her blood pressure daily for the next 2 weeks and then return for a BP check and she will bring in her cuff.  If her blood pressure trends up into the 150s over 90s at home she will contact us sooner.  If her blood pressure is elevated and her cuff is accurate we will likely start her on medication.  Lab work as outlined below.  Left knee pain She will continue to see orthopedics.   Orders Placed This Encounter  Procedures  . TSH  . Lipid panel  . Comp Met (CMET)  . CBC  . Urine Microscopic Only  . POCT Urinalysis Dipstick    No orders of the defined types were placed in this encounter.    Tommi Rumps, MD [place]

## 2018-12-12 NOTE — Assessment & Plan Note (Signed)
This has occurred in the setting of her knee pain.  I believe that was driving her blood pressure to be more elevated.  Her blood pressure is very well controlled today.  She will check her blood pressure daily for the next 2 weeks and then return for a BP check and she will bring in her cuff.  If her blood pressure trends up into the 150s over 90s at home she will contact us sooner.  If her blood pressure is elevated and her cuff is accurate we will likely start her on medication.  Lab work as outlined below.

## 2018-12-12 NOTE — Assessment & Plan Note (Signed)
She will continue to see orthopedics. 

## 2018-12-12 NOTE — Progress Notes (Signed)
Symptom [facility] [place]  Telephone:([phone] [phone]  Patient Care Team: Dana Dunlap Haven, MD as PCP - General (Family Medicine)   Name of the patient: Dana Dunlap Dunlap  [id number]  09/23/1970   Date of visit: 12/12/18  Diagnosis-anemia secondary to menorrhagia and fibroids  Chief complaint/ Reason for visit-blood transfusion reaction and hematuria  Interval history- Dana Dunlap Dunlap, 48 year old female, G0P0, with history of known fibroids, heavy and painful periods, presents to symptom management clinic for hematuria and possible blood transfusion reaction.  She says that she started noticing hematuria after her blood transfusion which has persisted since that time and become more bright red in color.  She says this is happened in the past after blood transfusions but she has not reported it or sought evaluation for her symptoms.  She feels weak and tired.  In the past she is just suffered through at home until symptoms resolved.  She denies back pain, chills, fever, dizziness, flank pain, flushing.  Denies shortness of breath, cough, or itching.  Family history of sickle cell trait and alpha thalassemia. Extensive history of transfusions. Is unaware of any antibodies.  She met with Dr. Conley Dunlap at [facility] in [facility] yesterday for discussion of management of her fibroids and to possibly go on disability.  Per review of the note, grossly enlarged fibroid uterus with numerous fibroids throughout the uterus.  She has been adamant that she does not want to undergo hysterectomy.  We explored this further and she says she has had a family member who underwent hysterectomy and then had significant menopausal symptoms.  She is unsure if ovaries were removed with family member surgery.  She also had family member who underwent [place] with subsequently bleeding and is not interested in this option due to possibility of rebleeding.  She questions  [place] article which references UA-we discussed that this was [place] and not medication as she had believed. She feels that medications are not being discussed with her and that doctors are too quick to recommend surgery and that doctors don't disclose all of the risks of surgery.  She may have had previous dose of Lupron ' birth control shot" and says she bled for 6 months constantly following.    ECOG FS:2 - Symptomatic, <50% confined to bed  Review of systems- Review of Systems  Constitutional: Positive for malaise/fatigue. Negative for chills, diaphoresis, fever and weight loss.  HENT: Negative for congestion, nosebleeds and sore throat.   Eyes: Negative for blurred vision, double vision and pain.  Respiratory: Negative for cough, hemoptysis, shortness of breath and wheezing.   Cardiovascular: Negative for chest pain, palpitations, orthopnea, claudication, leg swelling and PND.  Gastrointestinal: Negative for abdominal pain, blood in stool, constipation, diarrhea, heartburn, melena, nausea and vomiting.  Genitourinary: Positive for hematuria. Negative for dysuria, flank pain, frequency and urgency.  Musculoskeletal: Positive for joint pain. Negative for falls and myalgias.  Skin: Negative for itching and rash.  Neurological: Negative for dizziness, weakness and headaches.  Endo/Heme/Allergies: Negative for environmental allergies. Bruises/bleeds easily.  Psychiatric/Behavioral: Negative for depression. The patient is nervous/anxious.      Allergies  Allergen Reactions   Compazine [Prochlorperazine Edisylate] Other (See Comments)    Seizure     Past Medical History:  Diagnosis Date   Depression    Currently on Cymbalta   Fibroids    Frequent headaches    Worse during monthly menstrual cycle   Hypertension     Past  Surgical History:  Procedure Laterality Date   BREAST MASS EXCISION     benign    Social History   Socioeconomic History   Marital status: Single     Spouse name: Not on file   Number of children: 0   Years of education: 16   Highest education level: Bachelor's degree (e.g., BA, AB, BS)  Occupational History   Occupation: Psychologist, clinical: [employer]    Comment: Engineering geologist strain: Not hard at all   Food insecurity    Worry: Never true    Inability: Never true   Transportation needs    Medical: No    Non-medical: No  Tobacco Use   Smoking status: Never Smoker   Smokeless tobacco: Never Used  Substance and Sexual Activity   Alcohol use: Yes    Alcohol/week: 1.0 standard drinks    Types: 1 Glasses of wine per week    Comment: occasional alcohol use   Drug use: No   Sexual activity: Yes    Birth control/protection: Condom  Lifestyle   Physical activity    Days per week: 0 days    Minutes per session: 0 min   Stress: Very much  Relationships   Social connections    Talks on phone: Once a week    Gets together: Never    Attends religious service: Never    Active member of club or organization: No    Attends meetings of clubs or organizations: Never    Relationship status: Never married   Intimate partner violence    Fear of current or ex partner: No    Emotionally abused: No    Physically abused: No    Forced sexual activity: No  Other Topics Concern   Not on file  Social History Narrative   Dana Dunlap Dunlap is originally from [place]. She attended [employer] in [place] where she obtained her Therapist, nutritional in Biology in 1995. 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 She is in a long term relationship with her boyfried, Dana Dunlap Dunlap. They have been together for 6 years. Dana Dunlap Dunlap transferred to [facility] [employer] [employer] [facility] with [employer] in January. She enjoys reading and she enjoys the outdoors. She loves to travel. 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    Family History  Problem Relation  Age of Onset   Hypertension Mother    Hyperlipidemia Mother    Cancer Father        Prostate cancer   Hypertension Father    Hyperlipidemia Father    Stroke Maternal Aunt    Stroke Paternal Aunt    Diabetes Paternal Aunt      Current Outpatient Medications:    albuterol (VENTOLIN HFA) 108 (90 Base) MCG/ACT inhaler, Inhale 1-2 puffs into the lungs every 6 (six) hours as needed for wheezing or shortness of breath., Disp: 1 Inhaler, Rfl: 0   alendronate (FOSAMAX) 70 MG tablet, , Disp: , Rfl:    buPROPion (WELLBUTRIN) 75 MG tablet, TAKE ONE TABLET BY MOUTH EVERY MORNING, Disp: 30 tablet, Rfl: 0   DULoxetine (CYMBALTA) 60 MG capsule, TAKE 1 CAPSULE BY MOUTH  DAILY, Disp: 90 capsule, Rfl: 0   ferrous sulfate (FEOSOL) 325 (65 FE) MG tablet, Take 1 tablet (325 mg total) by mouth 2 (two) times daily with a meal., Disp: 180 tablet, Rfl: 3   hydrOXYzine (VISTARIL) 25 MG  capsule, Take 25 mg by mouth at bedtime as needed (sleep)., Disp: , Rfl:    meloxicam (MOBIC) 7.5 MG tablet, Take 1 tablet by mouth 1 day or 1 dose., Disp: , Rfl:    predniSONE (STERAPRED UNI-PAK 21 TAB) 10 MG (21) TBPK tablet, Day 1: 2 tab before breakfast, 1 after lunch, 1 after dinner, & 2 at bedtime Day 2: 1 tab before breakfast, 1 after lunch, 1 after dinner, & 2 at bedtime Day 3: 1 tab before breakfast, 1 after lunch, 1 after dinner, & 1 at bedtime Day 4: 1 tab before breakfast, 1 after lunch, & 1 at bedtime Day 5: 1 tab before breakfast & 1 at bedtime Day 6: 1 tablet before breakfast, Disp: 21 tablet, Rfl: 0   traMADol (ULTRAM) 50 MG tablet, , Disp: , Rfl:   Physical exam:  Vitals:   12/12/18 0935  BP: (!) 143/88  Pulse: 70  Resp: 14  Temp: 98.6 F (37 C)  SpO2: 100%  Weight: 159 lb (72.1 kg)   Physical Exam Constitutional:      General: She is not in acute distress.    Appearance: She is well-developed.  HENT:     Head: Atraumatic.     Nose: Nose normal.     Mouth/Throat:     Pharynx: No  oropharyngeal exudate.  Eyes:     General: No scleral icterus.    Conjunctiva/sclera: Conjunctivae normal.     Pupils: Pupils are equal, round, and reactive to light.  Neck:     Musculoskeletal: Normal range of motion and neck supple.  Cardiovascular:     Rate and Rhythm: Normal rate and regular rhythm.     Pulses: Normal pulses.     Heart sounds: Normal heart sounds.  Pulmonary:     Effort: Pulmonary effort is normal. No respiratory distress.     Breath sounds: Normal breath sounds. No wheezing or rhonchi.  Abdominal:     General: Bowel sounds are normal. There is no distension.     Palpations: Abdomen is soft.     Tenderness: There is no abdominal tenderness.  Musculoskeletal: Normal range of motion.  Skin:    General: Skin is warm and dry.     Findings: No rash.  Neurological:     Mental Status: She is alert and oriented to person, place, and time.     Motor: No weakness.  Psychiatric:        Mood and Affect: Mood normal. Affect is blunt.        Speech: Speech is tangential.        Behavior: Behavior is cooperative.      CMP Latest Ref Rng & Units 12/12/2018  Glucose 70 - 99 mg/dL 131(H)  BUN 6 - 20 mg/dL 15  Creatinine 0.44 - 1.00 mg/dL 1.05(H)  Sodium 135 - 145 mmol/L 138  Potassium 3.5 - 5.1 mmol/L 4.2  Chloride 98 - 111 mmol/L 105  CO2 22 - 32 mmol/L 26  Calcium 8.9 - 10.3 mg/dL 9.2  Total Protein 6.5 - 8.1 g/dL 7.0  Total Bilirubin 0.3 - 1.2 mg/dL 2.3(H)  Alkaline Phos 38 - 126 U/L 43  AST 15 - 41 U/L 59(H)  ALT 0 - 44 U/L 14   CBC Latest Ref Rng & Units 12/12/2018  WBC 4.0 - 10.5 K/uL 8.2  Hemoglobin 12.0 - 15.0 g/dL 7.7(L)  Hematocrit 36.0 - 46.0 % 27.2(L)  Platelets 150 - 400 K/uL 368    No images are attached  to the encounter.  No results found.  Assessment and plan- Patient is a 48 y.o. female diagnosed with thrombocytosis and iron deficiency anemia secondary to uterine fibroids who   1. Delayed hemolytic transfusion reaction - etiology unclear.  Hmg 7.7 (worse), Cre[place] 1.05. Post rxn dat igg negative, dat c3 negative. ahg test was negative. Post antibody screen was negative. LDH 1273. Blood sample was straw colored per technician. Patient symptomatic of hemolysis. Extensive discussion today regarding hemolysis and risks of potential reactions in the future. Per Dr. Saralyn Pilar, patient is ok to receive transfusions in the future. We discussed the risks of this and I encouraged her to address the underlying cause of the bleeding. Dr. Reuel Derby was consulted who advises that could consider phenotyping patient however, she cannot be phenotyped If she has been transfused in past 3 months given circulation of donor cells. In the interim, start medrol dose pack. Discussed management of DHTRs. Encouraged ample fluid intake. No evidence of worsening pain.   2. Uterine Fibroids- we again discussed that hte source of her bleeding is the fibroids and discussed options for management and referrals. She has had an extensive workup in the past and I reinforced that workup today. I encouraged her to follow up with [facility] where she has been seen before. We also discussed other providers based on her wish for an 'african [place] female doctor who is a specialist is fibroids'. She declined referral however.   Case and findings discussed with Dr. Grayland Ormond who contributed to, and agreed with plan of care.   RTC tomorrow for repeat labs & re-evaluation.    Visit Diagnosis 1. Delayed hemolytic transfusion reaction, initial encounter     Patient expressed understanding and was in agreement with this plan. She also understands that She can call clinic at any time with any questions, concerns, or complaints.   Thank you for allowing me to participate in the care of this patient.   Beckey Rutter, DNP, AGNP-C [place] at [facility] [phone] (work cell) [phone] (office)

## 2018-12-13 ENCOUNTER — Inpatient Hospital Stay: Payer: 59

## 2018-12-13 ENCOUNTER — Inpatient Hospital Stay (HOSPITAL_BASED_OUTPATIENT_CLINIC_OR_DEPARTMENT_OTHER): Payer: 59 | Admitting: Nurse Practitioner

## 2018-12-13 ENCOUNTER — Other Ambulatory Visit: Payer: Self-pay

## 2018-12-13 ENCOUNTER — Other Ambulatory Visit: Payer: Self-pay | Admitting: *Deleted

## 2018-12-13 DIAGNOSIS — D500 Iron deficiency anemia secondary to blood loss (chronic): Secondary | ICD-10-CM | POA: Diagnosis not present

## 2018-12-13 DIAGNOSIS — T80911A Delayed hemolytic transfusion reaction, unspecified incompatibility, initial encounter: Secondary | ICD-10-CM | POA: Diagnosis not present

## 2018-12-13 DIAGNOSIS — D259 Leiomyoma of uterus, unspecified: Secondary | ICD-10-CM

## 2018-12-13 DIAGNOSIS — D649 Anemia, unspecified: Secondary | ICD-10-CM

## 2018-12-13 LAB — CBC WITH DIFFERENTIAL/PLATELET
Abs Immature Granulocytes: 0.2 10*3/uL — ABNORMAL HIGH (ref 0.00–0.07)
Basophils Absolute: 0 10*3/uL (ref 0.0–0.1)
Basophils Relative: 0 %
Eosinophils Absolute: 0 10*3/uL (ref 0.0–0.5)
Eosinophils Relative: 0 %
HCT: 28 % — ABNORMAL LOW (ref 36.0–46.0)
Hemoglobin: 7.4 g/dL — ABNORMAL LOW (ref 12.0–15.0)
Immature Granulocytes: 2 %
Lymphocytes Relative: 16 %
Lymphs Abs: 1.7 10*3/uL (ref 0.7–4.0)
MCH: 17.1 pg — ABNORMAL LOW (ref 26.0–34.0)
MCHC: 26.4 g/dL — ABNORMAL LOW (ref 30.0–36.0)
MCV: 64.8 fL — ABNORMAL LOW (ref 80.0–100.0)
Monocytes Absolute: 0.5 10*3/uL (ref 0.1–1.0)
Monocytes Relative: 5 %
Neutro Abs: 8.1 10*3/uL — ABNORMAL HIGH (ref 1.7–7.7)
Neutrophils Relative %: 77 %
Platelets: 397 10*3/uL (ref 150–400)
RBC: 4.32 MIL/uL (ref 3.87–5.11)
RDW: 25.9 % — ABNORMAL HIGH (ref 11.5–15.5)
WBC: 10.6 10*3/uL — ABNORMAL HIGH (ref 4.0–10.5)
nRBC: 0 % (ref 0.0–0.2)

## 2018-12-13 LAB — LACTATE DEHYDROGENASE: LDH: 1023 U/L — ABNORMAL HIGH (ref 98–192)

## 2018-12-13 LAB — COMPREHENSIVE METABOLIC PANEL
ALT: 17 U/L (ref 0–44)
AST: 30 U/L (ref 15–41)
Albumin: 4 g/dL (ref 3.5–5.0)
Alkaline Phosphatase: 41 U/L (ref 38–126)
Anion gap: 7 (ref 5–15)
BUN: 14 mg/dL (ref 6–20)
CO2: 24 mmol/L (ref 22–32)
Calcium: 9.5 mg/dL (ref 8.9–10.3)
Chloride: 109 mmol/L (ref 98–111)
Creatinine, Ser: 0.87 mg/dL (ref 0.44–1.00)
GFR calc Af Amer: >60 mL/min (ref >60)
GFR calc non Af Amer: >60 mL/min (ref >60)
Glucose, Bld: 154 mg/dL — ABNORMAL HIGH (ref 70–99)
Potassium: 3.9 mmol/L (ref 3.5–5.1)
Sodium: 140 mmol/L (ref 135–145)
Total Bilirubin: 0.4 mg/dL (ref 0.3–1.2)
Total Protein: 7.5 g/dL (ref 6.5–8.1)

## 2018-12-13 LAB — SAMPLE TO BLOOD BANK

## 2018-12-13 LAB — HAPTOGLOBIN: Haptoglobin: <10 mg/dL — ABNORMAL LOW (ref 42–296)

## 2018-12-13 NOTE — Progress Notes (Signed)
[facility]  Telephone:([phone] [phone]  Patient Care Team: Dana Haven, MD as PCP - General (Family Medicine)   Name of the patient: Dana Dunlap  Dana Dunlap  1970/08/26   Date of visit: 12/13/18  Diagnosis-anemia secondary to menorrhagia and fibroids  Chief complaint/ Reason for visit-blood transfusion reaction and hematuria  Heme History:  Extensive history of uterine fibroids. Moved from [place] around 2014. She reported approximately 10 year history of worsening symptomatic fibroids at that time.   Saw doctor at [facility] but requested new provider.   Saw Dr. Linton Rump in [facility] for clinical trial but patient did not want to wait for clinical trial. Was referred to Dr. Rosana Berger whom she saw on 08/21/2013. Options were [place], MRI focused ultrasound, Abdominal myomectomy, abdominal hysterectomy.   She elected for conservative treatment and was seen by Dr. Alen Bleacher on 10/29/2013.  She was unwilling to forego her future fertility and refused hysterectomy.  Radiofrequency thermal ablation was also discussed.  MRI was ordered at that time.  Per note, uterus was completely replaced by a multitude of fibroids.  Myomectomy was not recommended.  Since she was unwilling to consider hysterectomy and was referred to IR for discussion of [place].  She saw Dr. Gardiner Rhyme on 05/12/2014.  MRI showed a large submucosal fibroid and a large pedunculated submucosal fibroid and she was felt not to be a candidate for [place]. She was referred back to Dr. Alen Bleacher for discussion of feasibility of transvaginal hysteroscopic myomectomy after which she may be able to consider [place] as staged procedure.  She continued to refuse definitive treatment via hysterectomy. Says she has known many women who have had complications from surgery or other interventions  She was hospitalized earlier in the year for profound anemia and was started on Depo-Provera and  had continued intermittent bleeding since that time.  Depo-Lupron was recommended but while waiting for medication to come into her local pharmacy she did some Internet research and felt uncomfortable with that therapeutic recommendation and did not follow up.   During hospitalization for symptomatic anemia she saw Dr. Norton [facility]. She asked for referral to [facility] or [facility].   Dr. Landry Mellow at [facility] reviewed her chart for possible Acessa Procedure and she was felt not to be a candidate due to 'too many to count' fibroids.   She was more recently seen by Dr. Conley Canal at [facility] on 11/27/2018.  Patient again declined hysterectomy saying it would take away her "womanhood".  Plan for endometrial biopsy at next visit.  Again hysterectomy, endometrial ablation, Mirena IUD, uterine artery embolization were discussed.  Patient interested in myomectomy which was cautioned against as this is a major abdominal surgery and may not be beneficial to removing the dominant fibroid without completing the hysterectomy and too many risks associated inpatient who no longer needs fertility sparing surgery.  She underwent ultrasound on 12/11/2018 which revealed uterus measuring 14 x 13 x 8 cm, EMS 6.25 mm, anteverted uterus containing more than 10 class III-VII fibroids, mass in the posterior corpus appears to have calcified borders, right ovary contains a simple 1.4 x 1.5 x 2 cm follicle.  Left ovary appears normal, no free fluid.  She again was offered a trial of Lupron and possible transvaginal myomectomy and was agreeable with plan for referral back to [facility] clinic. Endometrial biopsy was performed.    Interval history- Dana Dunlap, 48 year old female, G0P0, with history of known fibroids, heavy and painful periods, presents to symptom  management clinic for hematuria and possible blood transfusion reaction.  She says that she started noticing hematuria after her blood transfusion which has persisted since that time and become  more bright red in color.  She says this is happened in the past after blood transfusions but she has not reported it.  She feels weak and tired.  In the past she is just suffered through at home until symptoms resolved.  She denies back pain, chills, fever, dizziness, flank pain, flushing.  Denies shortness of breath, cough, or itching.  Family history of sickle cell trait and alpha thalassemia she has received multiple transfusions in her history.  Is unaware of any antibodies.  Would like to be tested for sickle cell trait and alpha thalassemia trait and wonders if these could be the cause of her anemia she continues to have ongoing menorrhagia, dysmenorrhea, pelvic pain.  Passes large clots and severe cramping.  She questions disability for her fibroids and anemia.    ECOG FS:2 - Symptomatic, <50% confined to bed  Review of systems- Review of Systems  Constitutional: Positive for malaise/fatigue. Negative for chills, diaphoresis, fever and weight loss.  HENT: Negative for congestion, nosebleeds and sore throat.   Respiratory: Negative for cough, hemoptysis, shortness of breath and wheezing.   Gastrointestinal: Negative for abdominal pain, blood in stool, constipation, diarrhea, melena and nausea.  Genitourinary: Positive for hematuria. Negative for dysuria, flank pain, frequency and urgency.  Musculoskeletal: Positive for joint pain (knee pain). Negative for myalgias.  Skin: Negative for itching and rash.  Neurological: Negative for dizziness and weakness.  Endo/Heme/Allergies: Does not bruise/bleed easily.  Psychiatric/Behavioral: Negative for depression. The patient is not nervous/anxious.      Allergies  Allergen Reactions  . Compazine [Prochlorperazine Edisylate] Other (See Comments)    Seizure     Past Medical History:  Diagnosis Date  . Depression    Currently on Cymbalta  . Fibroids   . Frequent headaches    Worse during monthly menstrual cycle  . Hypertension      Past Surgical History:  Procedure Laterality Date  . BREAST MASS EXCISION     benign    Social History   Socioeconomic History  . Marital status: Single    Spouse name: Not on file  . Number of children: 0  . Years of education: 16  . Highest education level: Bachelor's degree (e.g., BA, AB, BS)  Occupational History  . Occupation: Psychologist, clinical: [employer]    Comment: Engineer, building services  Social Needs  . Financial resource strain: Not hard at all  . Food insecurity    Worry: Never true    Inability: Never true  . Transportation needs    Medical: No    Non-medical: No  Tobacco Use  . Smoking status: Never Smoker  . Smokeless tobacco: Never Used  Substance and Sexual Activity  . Alcohol use: Yes    Alcohol/week: 1.0 standard drinks    Types: 1 Glasses of wine per week    Comment: occasional alcohol use  . Drug use: No  . Sexual activity: Yes    Birth control/protection: Condom  Lifestyle  . Physical activity    Days per week: 0 days    Minutes per session: 0 min  . Stress: Very much  Relationships  . Social Herbalist on phone: Once a week    Gets together: Never    Attends religious service: Never    Active member of  club or organization: No    Attends meetings of clubs or organizations: Never    Relationship status: Never married  . Intimate partner violence    Fear of current or ex partner: No    Emotionally abused: No    Physically abused: No    Forced sexual activity: No  Other Topics Concern  . Not on file  Social History Narrative   Arryn is originally from [place]. She attended [employer] in [place] where she obtained her Therapist, nutritional in Biology in 1995. 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 She is in a long term relationship with her boyfried, [name]. They have been together for 6 years. [name] transferred to [facility] with Andrew in  January. She enjoys reading and she enjoys the outdoors. She loves to travel. 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    Family History  Problem Relation Age of Onset  . Hypertension Mother   . Hyperlipidemia Mother   . Cancer Father        Prostate cancer  . Hypertension Father   . Hyperlipidemia Father   . Stroke Maternal Aunt   . Stroke Paternal Aunt   . Diabetes Paternal Aunt      Current Outpatient Medications:  .  albuterol (VENTOLIN HFA) 108 (90 Base) MCG/ACT inhaler, Inhale 1-2 puffs into the lungs every 6 (six) hours as needed for wheezing or shortness of breath., Disp: 1 Inhaler, Rfl: 0 .  alendronate (FOSAMAX) 70 MG tablet, , Disp: , Rfl:  .  buPROPion (WELLBUTRIN) 75 MG tablet, TAKE ONE TABLET BY MOUTH EVERY MORNING, Disp: 30 tablet, Rfl: 0 .  DULoxetine (CYMBALTA) 60 MG capsule, TAKE 1 CAPSULE BY MOUTH  DAILY, Disp: 90 capsule, Rfl: 0 .  ferrous sulfate (FEOSOL) 325 (65 FE) MG tablet, Take 1 tablet (325 mg total) by mouth 2 (two) times daily with a meal., Disp: 180 tablet, Rfl: 3 .  hydrOXYzine (VISTARIL) 25 MG capsule, Take 25 mg by mouth at bedtime as needed (sleep)., Disp: , Rfl:  .  meloxicam (MOBIC) 7.5 MG tablet, Take 1 tablet by mouth 1 day or 1 dose., Disp: , Rfl:  .  predniSONE (STERAPRED UNI-PAK 21 TAB) 10 MG (21) TBPK tablet, Day 1: 2 tab before breakfast, 1 after lunch, 1 after dinner, & 2 at bedtime Day 2: 1 tab before breakfast, 1 after lunch, 1 after dinner, & 2 at bedtime Day 3: 1 tab before breakfast, 1 after lunch, 1 after dinner, & 1 at bedtime Day 4: 1 tab before breakfast, 1 after lunch, & 1 at bedtime Day 5: 1 tab before breakfast & 1 at bedtime Day 6: 1 tablet before breakfast, Disp: 21 tablet, Rfl: 0 .  traMADol (ULTRAM) 50 MG tablet, , Disp: , Rfl:   Physical exam:  There were no vitals filed for this visit. Physical Exam Constitutional:      General: She is not in acute distress.    Appearance: Normal appearance.  Cardiovascular:      Rate and Rhythm: Normal rate and regular rhythm.  Pulmonary:     Effort: Pulmonary effort is normal.     Breath sounds: Normal breath sounds.  Abdominal:     General: Bowel sounds are normal. There is no distension.     Palpations: Abdomen is soft.     Tenderness: There is no abdominal tenderness.  Skin:    General:  Skin is warm and dry.     Coloration: Skin is not pale.  Neurological:     Mental Status: She is alert and oriented to person, place, and time.  Psychiatric:        Mood and Affect: Mood is anxious.        Speech: Speech is tangential.      CMP Latest Ref Rng & Units 12/13/2018  Glucose 70 - 99 mg/dL 154(H)  BUN 6 - 20 mg/dL 14  Creatinine 0.44 - 1.00 mg/dL 0.87  Sodium 135 - 145 mmol/L 140  Potassium 3.5 - 5.1 mmol/L 3.9  Chloride 98 - 111 mmol/L 109  CO2 22 - 32 mmol/L 24  Calcium 8.9 - 10.3 mg/dL 9.5  Total Protein 6.5 - 8.1 g/dL 7.5  Total Bilirubin 0.3 - 1.2 mg/dL 0.4  Alkaline Phos 38 - 126 U/L 41  AST 15 - 41 U/L 30  ALT 0 - 44 U/L 17   CBC Latest Ref Rng & Units 12/13/2018  WBC 4.0 - 10.5 K/uL 10.6(H)  Hemoglobin 12.0 - 15.0 g/dL 7.4(L)  Hematocrit 36.0 - 46.0 % 28.0(L)  Platelets 150 - 400 K/uL 397    No images are attached to the encounter.  No results found.  Assessment and plan- Patient is a 48 y.o. female with a delayed hemolytic transfusion reaction who presents to [facility] for re-evaluation.   Clinically improved since starting steroids. LDH down today though still elevated. Hemoglobin lower today at 7.4 (was 7.7 yesterday) but much less of a drop than previously. Symptomatically improved (less hematuria). Renal function improved. Transfusion labs did not show evidence of hemolytic transfusion labs and she was deemed ok to receive blood in the future though would consider steroids and pre-meds.   Lengthy discussion regarding need for surgery or better control of her bleeding. She is very anxious of risks of surgery. She says  she needs to feel comfortable with her doctor and says she would like a female doctor who is an expert with her condition and is aware of racial disparities. Encouraged her to follow up with Dr. Conley Canal as scheduled. Could consider referral to another provider if she wishes or '[place] doctor' whom she says in an 'expert in fibroids'.   Return to clinic on 12/16/2018 for repeat labs and follow up. Continue increased oral fluid intake.   Visit Diagnosis 1. Delayed hemolytic transfusion reaction, initial encounter   2. Uterine leiomyoma, unspecified location    Patient expressed understanding and was in agreement with this plan. She also understands that She can call clinic at any time with any questions, concerns, or complaints.   Thank you for allowing me to participate in the care of this patient.   Beckey Rutter, DNP, AGNP-C [facility] (work cell) [phone] (office)  CC: Dr. Grayland Ormond

## 2018-12-14 ENCOUNTER — Other Ambulatory Visit: Payer: Self-pay | Admitting: Family Medicine

## 2018-12-14 DIAGNOSIS — R7989 Other specified abnormal findings of blood chemistry: Secondary | ICD-10-CM

## 2018-12-14 DIAGNOSIS — R809 Proteinuria, unspecified: Secondary | ICD-10-CM

## 2018-12-16 ENCOUNTER — Inpatient Hospital Stay: Payer: 59

## 2018-12-16 ENCOUNTER — Encounter: Payer: Self-pay | Admitting: Nurse Practitioner

## 2018-12-16 ENCOUNTER — Other Ambulatory Visit: Payer: Self-pay

## 2018-12-16 ENCOUNTER — Ambulatory Visit: Payer: 59

## 2018-12-16 DIAGNOSIS — D500 Iron deficiency anemia secondary to blood loss (chronic): Secondary | ICD-10-CM | POA: Diagnosis not present

## 2018-12-16 DIAGNOSIS — T80911A Delayed hemolytic transfusion reaction, unspecified incompatibility, initial encounter: Secondary | ICD-10-CM

## 2018-12-16 LAB — COMPREHENSIVE METABOLIC PANEL
ALT: 17 U/L (ref 0–44)
AST: 14 U/L — ABNORMAL LOW (ref 15–41)
Albumin: 3.7 g/dL (ref 3.5–5.0)
Alkaline Phosphatase: 47 U/L (ref 38–126)
Anion gap: 9 (ref 5–15)
BUN: 16 mg/dL (ref 6–20)
CO2: 23 mmol/L (ref 22–32)
Calcium: 9 mg/dL (ref 8.9–10.3)
Chloride: 104 mmol/L (ref 98–111)
Creatinine, Ser: 0.89 mg/dL (ref 0.44–1.00)
GFR calc Af Amer: >60 mL/min (ref >60)
GFR calc non Af Amer: >60 mL/min (ref >60)
Glucose, Bld: 113 mg/dL — ABNORMAL HIGH (ref 70–99)
Potassium: 3.5 mmol/L (ref 3.5–5.1)
Sodium: 136 mmol/L (ref 135–145)
Total Bilirubin: 0.3 mg/dL (ref 0.3–1.2)
Total Protein: 7.6 g/dL (ref 6.5–8.1)

## 2018-12-16 LAB — CBC WITH DIFFERENTIAL/PLATELET
Abs Immature Granulocytes: 0.04 10*3/uL (ref 0.00–0.07)
Basophils Absolute: 0 10*3/uL (ref 0.0–0.1)
Basophils Relative: 0 %
Eosinophils Absolute: 0 10*3/uL (ref 0.0–0.5)
Eosinophils Relative: 0 %
HCT: 29.7 % — ABNORMAL LOW (ref 36.0–46.0)
Hemoglobin: 8.1 g/dL — ABNORMAL LOW (ref 12.0–15.0)
Immature Granulocytes: 1 %
Lymphocytes Relative: 25 %
Lymphs Abs: 1.7 10*3/uL (ref 0.7–4.0)
MCH: 18 pg — ABNORMAL LOW (ref 26.0–34.0)
MCHC: 27.3 g/dL — ABNORMAL LOW (ref 30.0–36.0)
MCV: 65.9 fL — ABNORMAL LOW (ref 80.0–100.0)
Monocytes Absolute: 0.6 10*3/uL (ref 0.1–1.0)
Monocytes Relative: 9 %
Neutro Abs: 4.4 10*3/uL (ref 1.7–7.7)
Neutrophils Relative %: 65 %
Platelets: 435 10*3/uL — ABNORMAL HIGH (ref 150–400)
RBC: 4.51 MIL/uL (ref 3.87–5.11)
RDW: 28.6 % — ABNORMAL HIGH (ref 11.5–15.5)
WBC: 6.8 10*3/uL (ref 4.0–10.5)
nRBC: 0.9 % — ABNORMAL HIGH (ref 0.0–0.2)

## 2018-12-16 LAB — LACTATE DEHYDROGENASE: LDH: 440 U/L — ABNORMAL HIGH (ref 98–192)

## 2018-12-18 ENCOUNTER — Other Ambulatory Visit: Payer: 59

## 2018-12-18 ENCOUNTER — Telehealth: Payer: Self-pay

## 2018-12-18 DIAGNOSIS — D473 Essential (hemorrhagic) thrombocythemia: Secondary | ICD-10-CM

## 2018-12-18 DIAGNOSIS — D75839 Thrombocytosis, unspecified: Secondary | ICD-10-CM

## 2018-12-18 DIAGNOSIS — D500 Iron deficiency anemia secondary to blood loss (chronic): Secondary | ICD-10-CM

## 2018-12-18 DIAGNOSIS — D708 Other neutropenia: Secondary | ICD-10-CM

## 2018-12-18 NOTE — Telephone Encounter (Signed)
Copied from [facility] [id number]. Topic: Referral - Request for Referral >> Dec 18, 2018  1:21 PM Celene Kras A wrote: Has patient seen PCP for this complaint? Yes.   *If NO, is insurance requiring patient see PCP for this issue before PCP can refer them? Referral for which specialty: Hematology  Preferred provider/office: [facility]/ Dr. Louretta Shorten [phone] option 3 Reason for referral: Pt is not satisfied with care that she is receiving at [facility] hematology. Please advise.

## 2018-12-19 NOTE — Telephone Encounter (Signed)
New referral placed.

## 2018-12-19 NOTE — Telephone Encounter (Signed)
Has patient seen PCP for this complaint? Yes.   *If NO, is insurance requiring patient see PCP for this issue before PCP can refer them? Referral for which specialty: Hematology  Preferred provider/office: [facility]/ Dr. Louretta Shorten [phone] option 3 Reason for referral: Pt is not satisfied with care that she is receiving at [facility] hematology. Please advise.

## 2018-12-19 NOTE — Addendum Note (Signed)
Addended by: [name], Melondy Blanchard G on: 12/19/2018 11:13 AM   Modules accepted: Orders

## 2018-12-25 ENCOUNTER — Ambulatory Visit (INDEPENDENT_AMBULATORY_CARE_PROVIDER_SITE_OTHER): Payer: 59 | Admitting: *Deleted

## 2018-12-25 ENCOUNTER — Other Ambulatory Visit: Payer: Self-pay

## 2018-12-25 VITALS — BP 148/90 | HR 82 | Resp 18

## 2018-12-25 DIAGNOSIS — R030 Elevated blood-pressure reading, without diagnosis of hypertension: Secondary | ICD-10-CM

## 2018-12-25 NOTE — Progress Notes (Signed)
Patient here for nurse visit BP check per order from 12/10/18.   Patient reports compliance with prescribed BP medications: yes  Last dose of BP medication: No BP meds.  BP Readings from Last 3 Encounters:  12/25/18 (!) 148/84  12/12/18 (!) 143/88  12/10/18 110/70   Pulse Readings from Last 3 Encounters:  12/25/18 82  12/12/18 70  12/10/18 70      Patient verbalized understanding of instructions.   Kerin Salen, RN

## 2018-12-28 NOTE — Progress Notes (Signed)
Blood pressure is uncontrolled.  I would advise that we start her on a low-dose of blood pressure medication and recheck her blood pressure in 1 month.  If she is willing to do this I can send it to her pharmacy.  She also dropped off a handwritten note asking for extension of her FMLA.  I am happy to extend this though there is typically a form that needs to be filled out for this.  Please see if she has an FMLA form and if not she needs to request one so that we can fax it in.  Thanks.

## 2018-12-31 ENCOUNTER — Ambulatory Visit (INDEPENDENT_AMBULATORY_CARE_PROVIDER_SITE_OTHER): Payer: 59 | Admitting: Licensed Clinical Social Worker

## 2018-12-31 DIAGNOSIS — F321 Major depressive disorder, single episode, moderate: Secondary | ICD-10-CM

## 2019-01-02 ENCOUNTER — Telehealth: Payer: Self-pay | Admitting: Family Medicine

## 2019-01-02 MED ORDER — AMLODIPINE BESYLATE 5 MG PO TABS: 5.0000 mg | ORAL_TABLET | Freq: Every day | ORAL | 3 refills | Status: DC

## 2019-01-02 NOTE — Telephone Encounter (Signed)
-----   Message from Leeanne Rio, [place] sent at 12/30/2018  3:30 PM EDT ----- Pt agreeable to starting on BP medication. Please send 30 day quantity to [facility]. Pt is aware to let us know if she notices any issues with medication.  She will have them fax her FMLA form to the office hopefully by tomorrow.   Additionally, pt voiced other concerned about her left knee. She was seen at [facility] & they reported that she has a torn meninscus, OA, & that she may need a bone scan.  Pt was very emotional & cried over the phone. She mentioned that she is very depressed since this news. Especially after a nurse told her there was nothing more that they could do but for her to keep wearing a brace & taking pain meds.  I told her to call back to [facility] & ask if there is anything more that could be done for her or what is the next step.  Pt advised to call us if she has any further concerns or issues.  Nurse visit schedule for BP check in one month.  ----- Message ----- From: Leone Haven, MD Sent: 12/28/2018   3:18 PM EDT To: Leeanne Rio, CMA  Could you contact this patient while Juliann Pulse is out? Typically I would forward it to her to complete.

## 2019-01-02 NOTE — Telephone Encounter (Signed)
Paperwork was received though is not for FMLA. It is for disability. This needs to be completed by her specialists as they are the ones who can determine if she has a disability related to her medical conditions. I am happy to fill out FMLA forms, though the disability forms need to be completed by her specialists.

## 2019-01-02 NOTE — Telephone Encounter (Signed)
Amlodipine sent to pharmacy. Please contact the patient and offer her follow-up with me regarding the depression. I will await her FMLA paperwork.

## 2019-01-02 NOTE — Progress Notes (Signed)
See phone note

## 2019-01-03 NOTE — Telephone Encounter (Signed)
Patient is going to talk with the specialist about filling out the disability in the meantime she would like for you to do a FMLA for her because she is still now working.  Nina,cma

## 2019-01-04 NOTE — Telephone Encounter (Signed)
We did not receive any FMLA paperwork from her employer. She should contact them to have her job send [employer] paperwork. Thanks.

## 2019-01-05 NOTE — Progress Notes (Signed)
[place]  [phone] [phone]  ID: Dana Dunlap OB: 01-13-1971  MR#: [id number]  UKG#:[id number]  Patient Care Team: Leone Haven, MD as PCP - General (Family Medicine)  CHIEF COMPLAINT: Thrombocytosis, iron deficiency anemia.  INTERVAL HISTORY: Patient returns to clinic today for further evaluation and consideration of additional IV Venofer for possible blood transfusion.  She had a transfusion reaction with her most recent treatment, but this is now resolved and she is back to her baseline.  She is highly anxious and has multiple complaints today.  She now states she is having her fibroids assessed in [place], [place].  She continues to have left knee pain from a torn meniscus which is being evaluated at [facility].  She has chronic weakness and fatigue.  She continues to have heavy menstrual bleeding secondary to her fibroids. She denies any recent fevers or illnesses.  She has no neurologic complaints.  She has no chest pain, shortness of breath, cough, or hemoptysis.  She denies any nausea, vomiting, constipation, or diarrhea. She has no melena or hematochezia.  She has no urinary complaints.  Patient offers no further specific complaints today.  REVIEW OF SYSTEMS:   Review of Systems  Constitutional: Positive for malaise/fatigue. Negative for fever and weight loss.  HENT: Negative.  Negative for congestion and sore throat.   Respiratory: Negative.  Negative for cough and shortness of breath.   Cardiovascular: Negative.  Negative for chest pain and leg swelling.  Gastrointestinal: Negative.  Negative for abdominal pain and blood in stool.  Genitourinary: Negative.  Negative for hematuria.  Musculoskeletal: Positive for joint pain.  Skin: Negative.  Negative for rash.  Neurological: Positive for weakness. Negative for focal weakness and headaches.  Psychiatric/Behavioral: Negative for depression. The patient is not  nervous/anxious.     As per HPI. Otherwise, a complete review of systems is negative.  PAST MEDICAL HISTORY: Past Medical History:  Diagnosis Date   Depression    Currently on Cymbalta   Fibroids    Frequent headaches    Worse during monthly menstrual cycle   Hypertension     PAST SURGICAL HISTORY: Past Surgical History:  Procedure Laterality Date   BREAST MASS EXCISION     benign    FAMILY HISTORY: Family History  Problem Relation Age of Onset   Hypertension Mother    Hyperlipidemia Mother    Cancer Father        Prostate cancer   Hypertension Father    Hyperlipidemia Father    Stroke Maternal Aunt    Stroke Paternal Aunt    Diabetes Paternal Aunt    Sickle cell trait Other    Thalassemia Other     ADVANCED DIRECTIVES (Y/N):  N  HEALTH MAINTENANCE: Social History   Tobacco Use   Smoking status: Never Smoker   Smokeless tobacco: Never Used  Substance Use Topics   Alcohol use: Yes    Alcohol/week: 1.0 standard drinks    Types: 1 Glasses of wine per week    Comment: occasional alcohol use   Drug use: No     Colonoscopy:  PAP:  Bone density:  Lipid panel:  Allergies  Allergen Reactions   Compazine [Prochlorperazine Edisylate] Other (See Comments)    Seizure     Current Outpatient Medications  Medication Sig Dispense Refill   albuterol (VENTOLIN HFA) 108 (90 Base) MCG/ACT inhaler Inhale 1-2 puffs into the lungs every 6 (six) hours as needed for wheezing or shortness of breath. 1  Inhaler 0   alendronate (FOSAMAX) 70 MG tablet      amLODipine (NORVASC) 5 MG tablet Take 1 tablet (5 mg total) by mouth daily. 30 tablet 3   buPROPion (WELLBUTRIN) 75 MG tablet TAKE ONE TABLET BY MOUTH EVERY MORNING 30 tablet 0   DULoxetine (CYMBALTA) 60 MG capsule TAKE 1 CAPSULE BY MOUTH  DAILY 90 capsule 0   ferrous sulfate (FEOSOL) 325 (65 FE) MG tablet Take 1 tablet (325 mg total) by mouth 2 (two) times daily with a meal. 180 tablet 3    hydrOXYzine (VISTARIL) 25 MG capsule Take 25 mg by mouth at bedtime as needed (sleep).     meloxicam (MOBIC) 7.5 MG tablet Take 1 tablet by mouth 1 day or 1 dose.     traMADol (ULTRAM) 50 MG tablet      No current facility-administered medications for this visit.     OBJECTIVE: Vitals:   01/09/19 1042  BP: 140/69  Pulse: 70  Temp: 99 F (37.2 C)     Body mass index is 30.86 kg/m.    ECOG FS:0 - Asymptomatic  General: Well-developed, well-nourished, no acute distress. Eyes: Pink conjunctiva, anicteric sclera. HEENT: Normocephalic, moist mucous membranes. Lungs: Clear to auscultation bilaterally. Heart: Regular rate and rhythm. No rubs, murmurs, or gallops. Abdomen: Soft, nontender, nondistended. No organomegaly noted, normoactive bowel sounds. Musculoskeletal: No edema, cyanosis, or clubbing.  Left knee in brace. Neuro: Alert, answering all questions appropriately. Cranial nerves grossly intact. Skin: No rashes or petechiae noted. Psych: Normal affect.  LAB RESULTS:  Lab Results  Component Value Date   NA 136 12/16/2018   K 3.5 12/16/2018   CL 104 12/16/2018   CO2 23 12/16/2018   GLUCOSE 113 (H) 12/16/2018   BUN 16 12/16/2018   CREATININE 0.89 12/16/2018   CALCIUM 9.0 12/16/2018   PROT 7.6 12/16/2018   ALBUMIN 3.7 12/16/2018   AST 14 (L) 12/16/2018   ALT 17 12/16/2018   ALKPHOS 47 12/16/2018   BILITOT 0.3 12/16/2018   GFRNONAA >60 12/16/2018   GFRAA >60 12/16/2018    Lab Results  Component Value Date   WBC 2.9 (L) 01/09/2019   NEUTROABS 1.5 (L) 01/09/2019   HGB 8.9 (L) 01/09/2019   HCT 32.1 (L) 01/09/2019   MCV 66.3 (L) 01/09/2019   PLT 491 (H) 01/09/2019   Lab Results  Component Value Date   IRON 15 (L) 01/09/2019   TIBC 367 01/09/2019   IRONPCTSAT 4 (L) 01/09/2019   Lab Results  Component Value Date   FERRITIN 11 01/09/2019     STUDIES: No results found.  ASSESSMENT: Thrombocytosis, iron deficiency anemia  PLAN:    1. Thrombocytosis:  Chronic and unchanged.  Secondary to iron deficiency. JAK-2 mutation is negative.   2. Iron deficiency anemia: Secondary to heavy fibroid bleeding.  Patient now states she is no longer going to [employer] and is being evaluated in [place], [place] later this week.  She expressed understanding that having her fibroids treated would likely resolve and no longer need for IV iron or blood transfusions.  Patient also was concerned about possible sickle cell trait, but hemoglobinopathy profile from April 12, 2016 revealed normal adult hemoglobin.  Patient's hemoglobin is improved to 8.9 today and though she remains iron deficient, will hold IV Venofer at this time.  Return to clinic in 2 weeks for repeat laboratory work and possible treatment and then in 4 weeks for further evaluation and consideration of additional IV Venofer. 3. Fibroids: Chronic.  Patient now reports she is going to [place], [place]. 4.  Leukopenia: Chronic and unchanged.  Monitor. 5.  Knee pain: Continue treatment per orthopedics although patient reports she is switching orthopedic surgeons.  Case previously discussed with orthopedics. 6.  Bone marrow: Patient's diffuse bone marrow signal likely is secondary to her severe iron deficiency anemia.  I suspect her bone marrow is hyperproliferative because of this.  If patient has her fibroids treated and her iron deficiency corrected yet still remains anemic, would consider a bone marrow biopsy at that point. 7.  Transfusion reaction: Resolved.  Patient now reports that she "always" has hematuria when she has had blood transfusion in the past, but we have no record of this.  Continue to monitor and transfuse packed red blood cells sparingly.   Patient expressed understanding and was in agreement with this plan. She also understands that She can call clinic at any time with any questions, concerns, or complaints.    Lloyd Huger, MD   01/12/2019 4:25 PM

## 2019-01-07 ENCOUNTER — Ambulatory Visit (INDEPENDENT_AMBULATORY_CARE_PROVIDER_SITE_OTHER): Payer: 59 | Admitting: Licensed Clinical Social Worker

## 2019-01-07 ENCOUNTER — Encounter: Payer: Self-pay | Admitting: Family Medicine

## 2019-01-07 DIAGNOSIS — F321 Major depressive disorder, single episode, moderate: Secondary | ICD-10-CM | POA: Diagnosis not present

## 2019-01-07 DIAGNOSIS — R809 Proteinuria, unspecified: Secondary | ICD-10-CM

## 2019-01-07 DIAGNOSIS — F331 Major depressive disorder, recurrent, moderate: Secondary | ICD-10-CM

## 2019-01-07 NOTE — Telephone Encounter (Signed)
I called the patient to ask for FMLA forms for the patient to be faxed to the provider he stated he will fill it out.  Patient crying and hysterical because she is not getting the disability form filled out.  Nina,cma

## 2019-01-08 ENCOUNTER — Other Ambulatory Visit: Payer: Self-pay

## 2019-01-08 ENCOUNTER — Other Ambulatory Visit: Payer: 59

## 2019-01-08 ENCOUNTER — Encounter: Payer: Self-pay | Admitting: Oncology

## 2019-01-08 DIAGNOSIS — R809 Proteinuria, unspecified: Secondary | ICD-10-CM

## 2019-01-08 NOTE — Progress Notes (Signed)
Patient pre screened for office appointment. Patient had multiple concerns regarding her care. She would like to know if she has ever been tested for Sickle Cell disease and Thalassemia because she has a strong family history. She stated that she has asked multiple times and has not received an answer. She is also requesting a Social Work appt.  To assist her in coordinating care. She is being treated at [facility] for various problems including a torn meniscus and fibroid tumors. She would like FMLA or Disability paper work Scientist, forensic. Call advised patient that she is being treated here for IDA and she should request that paperwork from [facility]. She stated that [facility] had refused.

## 2019-01-09 ENCOUNTER — Other Ambulatory Visit: Payer: Self-pay

## 2019-01-09 ENCOUNTER — Inpatient Hospital Stay: Payer: 59

## 2019-01-09 ENCOUNTER — Inpatient Hospital Stay (HOSPITAL_BASED_OUTPATIENT_CLINIC_OR_DEPARTMENT_OTHER): Payer: 59 | Admitting: Oncology

## 2019-01-09 ENCOUNTER — Inpatient Hospital Stay: Payer: 59 | Attending: Oncology

## 2019-01-09 VITALS — BP 140/69 | HR 70 | Temp 99.0°F | Wt 158.0 lb

## 2019-01-09 DIAGNOSIS — R7989 Other specified abnormal findings of blood chemistry: Secondary | ICD-10-CM | POA: Insufficient documentation

## 2019-01-09 DIAGNOSIS — Z79899 Other long term (current) drug therapy: Secondary | ICD-10-CM | POA: Insufficient documentation

## 2019-01-09 DIAGNOSIS — D649 Anemia, unspecified: Secondary | ICD-10-CM

## 2019-01-09 DIAGNOSIS — D500 Iron deficiency anemia secondary to blood loss (chronic): Secondary | ICD-10-CM

## 2019-01-09 DIAGNOSIS — I10 Essential (primary) hypertension: Secondary | ICD-10-CM | POA: Insufficient documentation

## 2019-01-09 DIAGNOSIS — D509 Iron deficiency anemia, unspecified: Secondary | ICD-10-CM | POA: Diagnosis present

## 2019-01-09 DIAGNOSIS — D72819 Decreased white blood cell count, unspecified: Secondary | ICD-10-CM | POA: Insufficient documentation

## 2019-01-09 LAB — CBC WITH DIFFERENTIAL/PLATELET
Abs Immature Granulocytes: 0 10*3/uL (ref 0.00–0.07)
Basophils Absolute: 0 10*3/uL (ref 0.0–0.1)
Basophils Relative: 1 %
Eosinophils Absolute: 0.1 10*3/uL (ref 0.0–0.5)
Eosinophils Relative: 4 %
HCT: 32.1 % — ABNORMAL LOW (ref 36.0–46.0)
Hemoglobin: 8.9 g/dL — ABNORMAL LOW (ref 12.0–15.0)
Immature Granulocytes: 0 %
Lymphocytes Relative: 36 %
Lymphs Abs: 1 10*3/uL (ref 0.7–4.0)
MCH: 18.4 pg — ABNORMAL LOW (ref 26.0–34.0)
MCHC: 27.7 g/dL — ABNORMAL LOW (ref 30.0–36.0)
MCV: 66.3 fL — ABNORMAL LOW (ref 80.0–100.0)
Monocytes Absolute: 0.2 10*3/uL (ref 0.1–1.0)
Monocytes Relative: 7 %
Neutro Abs: 1.5 10*3/uL — ABNORMAL LOW (ref 1.7–7.7)
Neutrophils Relative %: 52 %
Platelets: 491 10*3/uL — ABNORMAL HIGH (ref 150–400)
RBC: 4.84 MIL/uL (ref 3.87–5.11)
RDW: 24.5 % — ABNORMAL HIGH (ref 11.5–15.5)
WBC: 2.9 10*3/uL — ABNORMAL LOW (ref 4.0–10.5)
nRBC: 0 % (ref 0.0–0.2)

## 2019-01-09 LAB — SAMPLE TO BLOOD BANK

## 2019-01-09 LAB — IRON AND TIBC
Iron: 15 ug/dL — ABNORMAL LOW (ref 28–170)
Saturation Ratios: 4 % — ABNORMAL LOW (ref 10.4–31.8)
TIBC: 367 ug/dL (ref 250–450)
UIBC: 352 ug/dL

## 2019-01-09 LAB — PROTEIN / CREATININE RATIO, URINE
Creatinine, Urine: 49.5 mg/dL
Protein, Ur: 107.2 mg/dL
Protein/Creat Ratio: 2166 mg/g creat — ABNORMAL HIGH (ref 0–200)

## 2019-01-09 LAB — MICROSCOPIC EXAMINATION: Casts: NONE SEEN /lpf

## 2019-01-09 LAB — URINALYSIS, ROUTINE W REFLEX MICROSCOPIC
Bilirubin, UA: NEGATIVE
Glucose, UA: NEGATIVE
Ketones, UA: NEGATIVE
Leukocytes,UA: NEGATIVE
Nitrite, UA: NEGATIVE
RBC, UA: NEGATIVE
Specific Gravity, UA: 1.009 (ref 1.005–1.030)
Urobilinogen, Ur: 0.2 mg/dL (ref 0.2–1.0)
pH, UA: 6.5 (ref 5.0–7.5)

## 2019-01-09 LAB — FERRITIN: Ferritin: 11 ng/mL (ref 11–307)

## 2019-01-09 NOTE — Progress Notes (Signed)
No treatment today per Dr. Finnegan 

## 2019-01-10 LAB — URINE CULTURE

## 2019-01-15 ENCOUNTER — Ambulatory Visit (INDEPENDENT_AMBULATORY_CARE_PROVIDER_SITE_OTHER): Payer: 59 | Admitting: Licensed Clinical Social Worker

## 2019-01-15 DIAGNOSIS — F324 Major depressive disorder, single episode, in partial remission: Secondary | ICD-10-CM

## 2019-01-16 MED ORDER — DULOXETINE HCL 30 MG PO CPEP: 90.0000 mg | ORAL_CAPSULE | Freq: Every day | ORAL | 1 refills | Status: DC

## 2019-01-23 ENCOUNTER — Inpatient Hospital Stay: Payer: 59

## 2019-01-24 ENCOUNTER — Telehealth: Payer: Self-pay | Admitting: *Deleted

## 2019-01-24 NOTE — Telephone Encounter (Signed)
I called and informed the patient that I did not call her for anything that maybe it was a reminder call from the front office about her upcoming appointment and she understood.  Bartow Zylstra,cma

## 2019-01-24 NOTE — Telephone Encounter (Signed)
Copied from [place] [id number]. Topic: General - Inquiry >> Jan 24, 2019  3:14 PM Oneta Rack wrote: Reason for CRM:   Patient states she missed a call from PCP nurse but doesn't know what's it regarding , please advise

## 2019-01-29 ENCOUNTER — Ambulatory Visit (INDEPENDENT_AMBULATORY_CARE_PROVIDER_SITE_OTHER): Payer: 59 | Admitting: *Deleted

## 2019-01-29 ENCOUNTER — Other Ambulatory Visit: Payer: Self-pay

## 2019-01-29 VITALS — BP 126/74 | HR 72

## 2019-01-29 DIAGNOSIS — R030 Elevated blood-pressure reading, without diagnosis of hypertension: Secondary | ICD-10-CM

## 2019-01-29 MED ORDER — AMLODIPINE BESYLATE 5 MG PO TABS: 5.0000 mg | ORAL_TABLET | Freq: Every day | ORAL | 2 refills | Status: DC

## 2019-01-29 NOTE — Progress Notes (Signed)
Patient here for nurse visit BP check per order from 12/25/18 Note.   Patient reports compliance with prescribed BP medications: yes, Amlodipine 5 mg, patient reports doing well with medication. No swelling noted.  Last dose of BP medication: 0800  BP Readings from Last 3 Encounters:  01/29/19 126/74  01/09/19 140/69  12/25/18 (!) 148/90   Pulse Readings from Last 3 Encounters:  01/29/19 72  01/09/19 70  12/25/18 82   Patient needs refill on Amlodipine sent 90 day supply as requested.   Patient verbalized understanding of instructions.   Kerin Salen, RN

## 2019-01-30 NOTE — Progress Notes (Signed)
BP is well controlled. She should continue her amlodipine. Amlodipine already sent to pharmacy.   Tommi Rumps, MD

## 2019-02-02 NOTE — Progress Notes (Deleted)
[place]  Telephone:(336) [phone] [phone]) [phone]  ID: Dana Dunlap OB: 11-07-1970  MR#: [id number]  HAL#:[id number]  Patient Care Team: Leone Haven, MD as PCP - General (Family Medicine)  CHIEF COMPLAINT: Thrombocytosis, iron deficiency anemia.  INTERVAL HISTORY: Patient returns to clinic today for further evaluation and consideration of additional IV Venofer for possible blood transfusion.  She had a transfusion reaction with her most recent treatment, but this is now resolved and she is back to her baseline.  She is highly anxious and has multiple complaints today.  She now states she is having her fibroids assessed in [place], [place].  She continues to have left knee pain from a torn meniscus which is being evaluated at [facility].  She has chronic weakness and fatigue.  She continues to have heavy menstrual bleeding secondary to her fibroids. She denies any recent fevers or illnesses.  She has no neurologic complaints.  She has no chest pain, shortness of breath, cough, or hemoptysis.  She denies any nausea, vomiting, constipation, or diarrhea. She has no melena or hematochezia.  She has no urinary complaints.  Patient offers no further specific complaints today.  REVIEW OF SYSTEMS:   Review of Systems  Constitutional: Positive for malaise/fatigue. Negative for fever and weight loss.  HENT: Negative.  Negative for congestion and sore throat.   Respiratory: Negative.  Negative for cough and shortness of breath.   Cardiovascular: Negative.  Negative for chest pain and leg swelling.  Gastrointestinal: Negative.  Negative for abdominal pain and blood in stool.  Genitourinary: Negative.  Negative for hematuria.  Musculoskeletal: Positive for joint pain.  Skin: Negative.  Negative for rash.  Neurological: Positive for weakness. Negative for focal weakness and headaches.  Psychiatric/Behavioral: Negative for depression. The patient is not  nervous/anxious.     As per HPI. Otherwise, a complete review of systems is negative.  PAST MEDICAL HISTORY: Past Medical History:  Diagnosis Date   Depression    Currently on Cymbalta   Fibroids    Frequent headaches    Worse during monthly menstrual cycle   Hypertension     PAST SURGICAL HISTORY: Past Surgical History:  Procedure Laterality Date   BREAST MASS EXCISION     benign    FAMILY HISTORY: Family History  Problem Relation Age of Onset   Hypertension Mother    Hyperlipidemia Mother    Cancer Father        Prostate cancer   Hypertension Father    Hyperlipidemia Father    Stroke Maternal Aunt    Stroke Paternal Aunt    Diabetes Paternal Aunt    Sickle cell trait Other    Thalassemia Other     ADVANCED DIRECTIVES (Y/N):  N  HEALTH MAINTENANCE: Social History   Tobacco Use   Smoking status: Never Smoker   Smokeless tobacco: Never Used  Substance Use Topics   Alcohol use: Yes    Alcohol/week: 1.0 standard drinks    Types: 1 Glasses of wine per week    Comment: occasional alcohol use   Drug use: No     Colonoscopy:  PAP:  Bone density:  Lipid panel:  Allergies  Allergen Reactions   Compazine [Prochlorperazine Edisylate] Other (See Comments)    Seizure     Current Outpatient Medications  Medication Sig Dispense Refill   albuterol (VENTOLIN HFA) 108 (90 Base) MCG/ACT inhaler Inhale 1-2 puffs into the lungs every 6 (six) hours as needed for wheezing or shortness of breath.  1 Inhaler 0   alendronate (FOSAMAX) 70 MG tablet      amLODipine (NORVASC) 5 MG tablet Take 1 tablet (5 mg total) by mouth daily. 90 tablet 2   buPROPion (WELLBUTRIN) 75 MG tablet TAKE ONE TABLET BY MOUTH EVERY MORNING 30 tablet 0   DULoxetine (CYMBALTA) 30 MG capsule Take 3 capsules (90 mg total) by mouth daily. 270 capsule 1   ferrous sulfate (FEOSOL) 325 (65 FE) MG tablet Take 1 tablet (325 mg total) by mouth 2 (two) times daily with a meal. 180  tablet 3   hydrOXYzine (VISTARIL) 25 MG capsule Take 25 mg by mouth at bedtime as needed (sleep).     meloxicam (MOBIC) 7.5 MG tablet Take 1 tablet by mouth 1 day or 1 dose.     traMADol (ULTRAM) 50 MG tablet      No current facility-administered medications for this visit.     OBJECTIVE: There were no vitals filed for this visit.   There is no height or weight on file to calculate BMI.    ECOG FS:0 - Asymptomatic  General: Well-developed, well-nourished, no acute distress. Eyes: Pink conjunctiva, anicteric sclera. HEENT: Normocephalic, moist mucous membranes. Lungs: Clear to auscultation bilaterally. Heart: Regular rate and rhythm. No rubs, murmurs, or gallops. Abdomen: Soft, nontender, nondistended. No organomegaly noted, normoactive bowel sounds. Musculoskeletal: No edema, cyanosis, or clubbing.  Left knee in brace. Neuro: Alert, answering all questions appropriately. Cranial nerves grossly intact. Skin: No rashes or petechiae noted. Psych: Normal affect.  LAB RESULTS:  Lab Results  Component Value Date   NA 136 12/16/2018   K 3.5 12/16/2018   CL 104 12/16/2018   CO2 23 12/16/2018   GLUCOSE 113 (H) 12/16/2018   BUN 16 12/16/2018   CREATININE 0.89 12/16/2018   CALCIUM 9.0 12/16/2018   PROT 7.6 12/16/2018   ALBUMIN 3.7 12/16/2018   AST 14 (L) 12/16/2018   ALT 17 12/16/2018   ALKPHOS 47 12/16/2018   BILITOT 0.3 12/16/2018   GFRNONAA >60 12/16/2018   GFRAA >60 12/16/2018    Lab Results  Component Value Date   WBC 2.9 (L) 01/09/2019   NEUTROABS 1.5 (L) 01/09/2019   HGB 8.9 (L) 01/09/2019   HCT 32.1 (L) 01/09/2019   MCV 66.3 (L) 01/09/2019   PLT 491 (H) 01/09/2019   Lab Results  Component Value Date   IRON 15 (L) 01/09/2019   TIBC 367 01/09/2019   IRONPCTSAT 4 (L) 01/09/2019   Lab Results  Component Value Date   FERRITIN 11 01/09/2019     STUDIES: No results found.  ASSESSMENT: Thrombocytosis, iron deficiency anemia  PLAN:    1. Thrombocytosis:  Chronic and unchanged.  Secondary to iron deficiency. JAK-2 mutation is negative.   2. Iron deficiency anemia: Secondary to heavy fibroid bleeding.  Patient now states she is no longer going to [employer] and is being evaluated in [place], [place] later this week.  She expressed understanding that having her fibroids treated would likely resolve and no longer need for IV iron or blood transfusions.  Patient also was concerned about possible sickle cell trait, but hemoglobinopathy profile from April 12, 2016 revealed normal adult hemoglobin.  Patient's hemoglobin is improved to 8.9 today and though she remains iron deficient, will hold IV Venofer at this time.  Return to clinic in 2 weeks for repeat laboratory work and possible treatment and then in 4 weeks for further evaluation and consideration of additional IV Venofer. 3. Fibroids: Chronic.  Patient now  reports she is going to [place], [place]. 4.  Leukopenia: Chronic and unchanged.  Monitor. 5.  Knee pain: Continue treatment per orthopedics although patient reports she is switching orthopedic surgeons.  Case previously discussed with orthopedics. 6.  Bone marrow: Patient's diffuse bone marrow signal likely is secondary to her severe iron deficiency anemia.  I suspect her bone marrow is hyperproliferative because of this.  If patient has her fibroids treated and her iron deficiency corrected yet still remains anemic, would consider a bone marrow biopsy at that point. 7.  Transfusion reaction: Resolved.  Patient now reports that she "always" has hematuria when she has had blood transfusion in the past, but we have no record of this.  Continue to monitor and transfuse packed red blood cells sparingly.   Patient expressed understanding and was in agreement with this plan. She also understands that She can call clinic at any time with any questions, concerns, or complaints.    Lloyd Huger, MD   02/02/2019 8:08 AM

## 2019-02-06 ENCOUNTER — Ambulatory Visit (INDEPENDENT_AMBULATORY_CARE_PROVIDER_SITE_OTHER): Payer: 59 | Admitting: Licensed Clinical Social Worker

## 2019-02-06 DIAGNOSIS — F324 Major depressive disorder, single episode, in partial remission: Secondary | ICD-10-CM

## 2019-02-06 NOTE — Progress Notes (Signed)
Patient aware.

## 2019-02-07 ENCOUNTER — Inpatient Hospital Stay: Payer: 59

## 2019-02-07 ENCOUNTER — Inpatient Hospital Stay: Payer: 59 | Admitting: Oncology

## 2019-02-07 DIAGNOSIS — D500 Iron deficiency anemia secondary to blood loss (chronic): Secondary | ICD-10-CM

## 2019-02-16 ENCOUNTER — Encounter: Payer: Self-pay | Admitting: Family Medicine

## 2019-02-17 ENCOUNTER — Telehealth: Payer: Self-pay | Admitting: *Deleted

## 2019-02-17 NOTE — Telephone Encounter (Signed)
If Dana Dunlap says the disability forms need to be completed by orthopedics, then that is who will need to do it.  I have not been involved much in her case in regard to the knee/being out of work for knee pain or upcoming surgery so I do not feel I can fill out paperwork  LG

## 2019-02-17 NOTE — Telephone Encounter (Signed)
Dana Dunlap this patient wants to speak only to you at this point, I have spoken with her but she is insisting on you calling her.  [phone].  Dana Dunlap,cma

## 2019-02-17 NOTE — Telephone Encounter (Signed)
Copied from [place] [id number]. Topic: Quick Communication - See Telephone Encounter >> Feb 17, 2019  8:22 AM Loma Boston wrote: CRM for notification. See Telephone encounter for: 02/17/19. Dana Dunlap has called in and is in tears, has had a knee injury and has seen Michel Bickers dr that said she needs a total knee replacement she has a pending deadline for her disablity or her claim will be terminated. She is overwhelmed and is now having BP issues and panicking, a nurse or an assistant or any liasons for disability please reach out to her. [phone] Pls reference the mychart message to Dr Chauncey Cruel  that was sent last nite. He basically know about situation. Her benefits will be terminated and no longer have health insurance W/O PCP input.

## 2019-02-17 NOTE — Telephone Encounter (Signed)
Left knee dis ability saw [facility] , says she has torn meniscus and osteoarthritis, she is [employer] with parent s she is in [place] for care and knee replacement , patient needs disability forms completed says Drs in [employer] and wanted her to have a complete knee replacement . Advised patient then [employer] doctors would complete disability and FMLA paperwork so she will not loose her job they are the diagnosing provider.Patient very upset says she will be writing letter about her care in [place] she is very disappointed with her care.

## 2019-02-24 ENCOUNTER — Ambulatory Visit (INDEPENDENT_AMBULATORY_CARE_PROVIDER_SITE_OTHER): Payer: 59 | Admitting: Licensed Clinical Social Worker

## 2019-02-24 DIAGNOSIS — F324 Major depressive disorder, single episode, in partial remission: Secondary | ICD-10-CM | POA: Diagnosis not present

## 2019-03-11 ENCOUNTER — Ambulatory Visit (INDEPENDENT_AMBULATORY_CARE_PROVIDER_SITE_OTHER): Payer: 59 | Admitting: Licensed Clinical Social Worker

## 2019-03-11 DIAGNOSIS — F324 Major depressive disorder, single episode, in partial remission: Secondary | ICD-10-CM

## 2019-03-11 NOTE — Telephone Encounter (Signed)
Reviewed

## 2019-03-11 NOTE — Telephone Encounter (Signed)
Spoke with patient to hear her concerns about other providers not related to [facility]. I explained to the patient in viewing her chart over the past few months her PCP has provided referrals, phone numbers and the names of providers where she could send her FMLA paperwork to be completed.  Patient stated she is now in [place] and getting the help she needs. No additional follow-up needed at this time.

## 2019-03-11 NOTE — Telephone Encounter (Signed)
Pt states that she had to leave [place] to get medical care for her knee.  Pt states that she is in [place] getting medical care.  States that she wants to speak with someone about this. Pt states that she was told her PCP should have helped her with care and follow up.   Pt states that because PCP didn't care for her she has missed out on 2 months of disability insurance and care. Pt would like to speak with an office administrator about this because of multiple complaints about staff. Pt can be reached at [phone].

## 2019-03-11 NOTE — Telephone Encounter (Signed)
Reviewed. Thank you for speaking with her.

## 2019-03-14 ENCOUNTER — Ambulatory Visit: Payer: 59 | Admitting: Family Medicine

## 2019-04-09 ENCOUNTER — Ambulatory Visit (INDEPENDENT_AMBULATORY_CARE_PROVIDER_SITE_OTHER): Payer: 59 | Admitting: Licensed Clinical Social Worker

## 2019-04-09 DIAGNOSIS — F324 Major depressive disorder, single episode, in partial remission: Secondary | ICD-10-CM

## 2019-04-11 ENCOUNTER — Telehealth: Payer: Self-pay | Admitting: Family Medicine

## 2019-04-11 ENCOUNTER — Other Ambulatory Visit: Payer: Self-pay

## 2019-04-11 MED ORDER — AMLODIPINE BESYLATE 5 MG PO TABS: 5.0000 mg | ORAL_TABLET | Freq: Every day | ORAL | 2 refills | Status: DC

## 2019-04-11 NOTE — Telephone Encounter (Signed)
Pt needs refill on amLODipine (NORVASC) 5 MG tablet sent to [facility]. Do not send to [employer] [facility]

## 2019-04-11 NOTE — Telephone Encounter (Signed)
Sent in  A refill on [facility]lodipine [facility] per patient request.  Calum Cormier,cma

## 2019-04-22 ENCOUNTER — Ambulatory Visit (INDEPENDENT_AMBULATORY_CARE_PROVIDER_SITE_OTHER): Payer: 59 | Admitting: Licensed Clinical Social Worker

## 2019-04-22 DIAGNOSIS — F324 Major depressive disorder, single episode, in partial remission: Secondary | ICD-10-CM | POA: Diagnosis not present

## 2019-05-06 ENCOUNTER — Ambulatory Visit: Payer: 59 | Admitting: Licensed Clinical Social Worker

## 2019-05-22 ENCOUNTER — Ambulatory Visit (INDEPENDENT_AMBULATORY_CARE_PROVIDER_SITE_OTHER): Payer: Self-pay | Admitting: Licensed Clinical Social Worker

## 2019-05-22 DIAGNOSIS — F324 Major depressive disorder, single episode, in partial remission: Secondary | ICD-10-CM

## 2019-05-28 ENCOUNTER — Other Ambulatory Visit: Payer: Self-pay | Admitting: Family Medicine

## 2019-05-28 DIAGNOSIS — F331 Major depressive disorder, recurrent, moderate: Secondary | ICD-10-CM

## 2019-05-29 ENCOUNTER — Ambulatory Visit: Payer: Self-pay | Admitting: Licensed Clinical Social Worker

## 2019-06-05 ENCOUNTER — Ambulatory Visit (INDEPENDENT_AMBULATORY_CARE_PROVIDER_SITE_OTHER): Payer: Self-pay | Admitting: Licensed Clinical Social Worker

## 2019-06-05 DIAGNOSIS — F324 Major depressive disorder, single episode, in partial remission: Secondary | ICD-10-CM

## 2019-07-01 ENCOUNTER — Ambulatory Visit (INDEPENDENT_AMBULATORY_CARE_PROVIDER_SITE_OTHER): Payer: Self-pay | Admitting: Licensed Clinical Social Worker

## 2019-07-01 DIAGNOSIS — F324 Major depressive disorder, single episode, in partial remission: Secondary | ICD-10-CM

## 2019-09-23 ENCOUNTER — Telehealth: Payer: Self-pay | Admitting: Family Medicine

## 2019-09-23 NOTE — Telephone Encounter (Signed)
  Community Resource Referral   [place] 09/23/2019  1st Attempt  DOB: 02-25-1971   AGE: 49 y.o.   GENDER: female   PCP Leone Haven, MD.   Called pt regarding Community Resource Referral LMTCB Follow up on: 09/23/2019 [place] . [email or web address]  [phone]

## 2019-09-24 ENCOUNTER — Ambulatory Visit: Payer: Self-pay | Admitting: Nurse Practitioner

## 2019-09-25 NOTE — Telephone Encounter (Signed)
  Community Resource Referral   [place] 09/25/2019  DOB: 1971-02-05   AGE: 49 y.o.   GENDER: female   PCP Leone Haven, MD.   Called pt regarding Community Resource Referral  NO ANSWER SENT FOLLOW UP EMAIL Follow up on: 09/26/2019  From: Jill Alexanders [facility])  Sent: Thursday, September 25, 2019 9:59 AM To: [email or web address] Subject: FW: Secure: Medicaid Coverage for Vision   Good morning Ms. Dana Dunlap,  I just tried calling you a few minutes ago to discuss the [facility] application and your voicemail was full. Could you please give me a call back at your earliest convenience [phone]? Thanks!    [place] . [email or web address]  [phone]

## 2019-09-26 NOTE — Telephone Encounter (Signed)
Sent follow up email to Ms. Deupree was unable to reach, will refer to [employer] and notify [employer]. Closing referral pending any other needs of patient. KNB

## 2019-10-13 ENCOUNTER — Ambulatory Visit: Payer: Self-pay | Admitting: Psychology

## 2019-10-30 ENCOUNTER — Ambulatory Visit: Payer: Self-pay | Admitting: Adult Health

## 2020-01-28 ENCOUNTER — Other Ambulatory Visit: Payer: Self-pay | Admitting: Psychiatry

## 2020-02-06 ENCOUNTER — Other Ambulatory Visit: Payer: Self-pay

## 2020-02-06 ENCOUNTER — Ambulatory Visit: Payer: Medicaid Other | Admitting: Pharmacy Technician

## 2020-02-06 ENCOUNTER — Ambulatory Visit: Payer: Medicaid Other

## 2020-02-06 VITALS — Wt 165.0 lb

## 2020-02-06 DIAGNOSIS — Z79899 Other long term (current) drug therapy: Secondary | ICD-10-CM

## 2020-02-06 NOTE — Progress Notes (Signed)
[facility]ication Management Clinic Visit Note  Patient: Shawntay Prest MRN: [id number] Date of Birth: [birth year] PCP: Leone Haven, MD   [name] 49 y.o. female presents for a medication therapy management visit today.  Wt 165 lb (74.8 kg) Comment: patient reported  BMI 32.22 kg/m   Patient Information   Past Medical History:  Diagnosis Date  . Depression    Currently on Cymbalta  . Eye abnormalities   . Fibroids   . Frequent headaches    Worse during monthly menstrual cycle  . Hypertension       Past Surgical History:  Procedure Laterality Date  . BREAST MASS EXCISION     benign  . KNEE SURGERY       Family History  Problem Relation Age of Onset  . Hypertension Mother   . Hyperlipidemia Mother   . Cancer Father        Prostate cancer  . Hypertension Father   . Hyperlipidemia Father   . Stroke Maternal Aunt   . Stroke Paternal Aunt   . Diabetes Paternal Aunt   . Sickle cell trait Other   . Thalassemia Other     New Diagnoses (since last visit): Recent knee replacement, currently seeing eye doctor for floaters     Social History   Substance and Sexual Activity  Alcohol Use Yes  . Alcohol/week: 1.0 standard drink  . Types: 1 Glasses of wine per week   Comment: occasional alcohol use      Social History   Tobacco Use  Smoking Status Never Smoker  Smokeless Tobacco Never Used      Health Maintenance  Topic Date Due  . Hepatitis C Screening  Never done  . HIV Screening  Never done  . PAP SMEAR-Modifier  11/12/2017  . MAMMOGRAM  Never done  . COLONOSCOPY  Never done  . INFLUENZA VACCINE  Never done  . TETANUS/TDAP  11/12/2022  . COVID-19 Vaccine  Completed   Health Maintenance/Date Completed  Last ED visit: 01/2018 Next Visit to PCP: 05/2020 Dental Exam: 2020 Eye Exam: 10/2019 Pelvic/PAP Exam: 2020 Mammogram: 2020 Flu Vaccine: pt refuses COVID-19 Vaccine: second dose June or July 2021  Outpatient Encounter Medications as of  02/06/2020  Medication Sig  . acetaminophen (TYLENOL) 325 MG tablet Take 650 mg by mouth every 6 (six) hours as needed.  Marland Kitchen amLODipine (NORVASC) 5 MG tablet Take 1 tablet (5 mg total) by mouth daily.  . DULoxetine (CYMBALTA) 30 MG capsule TAKE 3 CAPSULES BY MOUTH  DAILY  . ferrous sulfate (FEOSOL) 325 (65 FE) MG tablet Take 1 tablet (325 mg total) by mouth 2 (two) times daily with a meal.  . FLUoxetine (PROZAC) 20 MG capsule Take 20 mg by mouth daily.  . hydrOXYzine (VISTARIL) 25 MG capsule Take 25 mg by mouth at bedtime as needed (sleep).  Marland Kitchen ibuprofen (ADVIL) 200 MG tablet Take 200 mg by mouth every 6 (six) hours as needed.   No facility-administered encounter medications on file as of 02/06/2020.   Assessment and Plan:  Adherence/Access: Pt reports no issues with adherence or missed doses.  Depression: Pt currently taking duloxetine 90 mg daily. Pt provider is beginning to taper down duloxetine dose. Pt will be starting fluoxetine 20 mg daily in AM to replace the duloxetine.  Plan: Continue with duloxetine taper. Start fluoxetine therapy.  Hypertension: Pt taking amlodipine 5 mg daily. Pt does not monitor blood pressure at home. Most recent reading recorded 126/74 which is within goal of < 130/80.  Plan: Continue taking amlodipine 5 mg daily for high blood pressure.  Knee Pain: Pt taking ibuprofen 200 mg and acetaminophen 325 mg as needed for knee pain. Pt had a knee replacement a few months ago. Pt does walk 2x/week for 15-30 minutes each time.  Plan: Continue taking ibuprofen and acetaminophen as needed.  Sleep: Pt taking hydroxyzine 25 mg at bedtime as needed Plan: Continue using hydroxyzine as needed for sleep.  Return in 1 year for outreach MTM  Benn Moulder, PharmD Pharmacy Resident  02/06/2020 3:43 PM

## 2020-02-06 NOTE — Progress Notes (Signed)
Completed [facility]ication Management Clinic application and contract.  Patient agreed to all terms of the [facility] Management [facility] contract.    Patient approved to receive medication assistance at [facility] until time for re-certification in 4801, and as long as eligibility criteria continues to be met.    Provided patient with community resource material based on her particular needs.    Referred for MTM.  Referred to [facility].  [place] [facility]

## 2020-02-09 ENCOUNTER — Other Ambulatory Visit: Payer: Medicaid Other

## 2020-02-11 ENCOUNTER — Other Ambulatory Visit: Payer: Medicaid Other

## 2020-03-03 ENCOUNTER — Other Ambulatory Visit: Payer: Self-pay | Admitting: Psychiatry

## 2020-03-24 ENCOUNTER — Encounter: Payer: Self-pay | Admitting: Emergency Medicine

## 2020-03-24 ENCOUNTER — Observation Stay
Admission: EM | Admit: 2020-03-24 | Discharge: 2020-03-25 | Disposition: A | Discharge status: Home / Self Care | Payer: Medicaid Other | Attending: Internal Medicine | Admitting: Internal Medicine

## 2020-03-24 ENCOUNTER — Other Ambulatory Visit: Payer: Self-pay

## 2020-03-24 ENCOUNTER — Ambulatory Visit: Payer: Self-pay

## 2020-03-24 DIAGNOSIS — D259 Leiomyoma of uterus, unspecified: Secondary | ICD-10-CM | POA: Insufficient documentation

## 2020-03-24 DIAGNOSIS — D649 Anemia, unspecified: Principal | ICD-10-CM | POA: Diagnosis present

## 2020-03-24 DIAGNOSIS — D500 Iron deficiency anemia secondary to blood loss (chronic): Secondary | ICD-10-CM

## 2020-03-24 DIAGNOSIS — F419 Anxiety disorder, unspecified: Secondary | ICD-10-CM | POA: Diagnosis present

## 2020-03-24 DIAGNOSIS — D219 Benign neoplasm of connective and other soft tissue, unspecified: Secondary | ICD-10-CM

## 2020-03-24 DIAGNOSIS — Z79899 Other long term (current) drug therapy: Secondary | ICD-10-CM | POA: Insufficient documentation

## 2020-03-24 DIAGNOSIS — F5089 Other specified eating disorder: Secondary | ICD-10-CM

## 2020-03-24 DIAGNOSIS — I10 Essential (primary) hypertension: Secondary | ICD-10-CM

## 2020-03-24 DIAGNOSIS — F331 Major depressive disorder, recurrent, moderate: Secondary | ICD-10-CM

## 2020-03-24 DIAGNOSIS — Z20822 Contact with and (suspected) exposure to covid-19: Secondary | ICD-10-CM | POA: Insufficient documentation

## 2020-03-24 DIAGNOSIS — F32A Depression, unspecified: Secondary | ICD-10-CM | POA: Diagnosis present

## 2020-03-24 LAB — CBC
HCT: 13.6 % — CL (ref 36.0–46.0)
Hemoglobin: 3.6 g/dL — CL (ref 12.0–15.0)
MCH: 15.4 pg — ABNORMAL LOW (ref 26.0–34.0)
MCHC: 26.5 g/dL — ABNORMAL LOW (ref 30.0–36.0)
MCV: 58.1 fL — ABNORMAL LOW (ref 80.0–100.0)
Platelets: 153 10*3/uL (ref 150–400)
RBC: 2.34 MIL/uL — ABNORMAL LOW (ref 3.87–5.11)
RDW: 23.9 % — ABNORMAL HIGH (ref 11.5–15.5)
WBC: 4 10*3/uL (ref 4.0–10.5)
nRBC: 0 % (ref 0.0–0.2)

## 2020-03-24 LAB — IRON AND TIBC
Iron: 11 ug/dL — ABNORMAL LOW (ref 28–170)
Saturation Ratios: 2 % — ABNORMAL LOW (ref 10.4–31.8)
TIBC: 511 ug/dL — ABNORMAL HIGH (ref 250–450)
UIBC: 500 ug/dL

## 2020-03-24 LAB — COMPREHENSIVE METABOLIC PANEL
ALT: 11 U/L (ref 0–44)
AST: 16 U/L (ref 15–41)
Albumin: 3.7 g/dL (ref 3.5–5.0)
Alkaline Phosphatase: 44 U/L (ref 38–126)
Anion gap: 8 (ref 5–15)
BUN: 12 mg/dL (ref 6–20)
CO2: 23 mmol/L (ref 22–32)
Calcium: 8.9 mg/dL (ref 8.9–10.3)
Chloride: 106 mmol/L (ref 98–111)
Creatinine, Ser: 0.99 mg/dL (ref 0.44–1.00)
GFR, Estimated: >60 mL/min (ref >60)
Glucose, Bld: 101 mg/dL — ABNORMAL HIGH (ref 70–99)
Potassium: 3.8 mmol/L (ref 3.5–5.1)
Sodium: 137 mmol/L (ref 135–145)
Total Bilirubin: 0.3 mg/dL (ref 0.3–1.2)
Total Protein: 6.9 g/dL (ref 6.5–8.1)

## 2020-03-24 LAB — RESP PANEL BY RT-PCR (FLU A&B, COVID) ARPGX2
Influenza A by PCR: NEGATIVE
Influenza B by PCR: NEGATIVE
SARS Coronavirus 2 by RT PCR: NEGATIVE

## 2020-03-24 LAB — PREPARE RBC (CROSSMATCH)

## 2020-03-24 MED ORDER — SODIUM CHLORIDE 0.9% FLUSH
3.0000 mL | Freq: Two times a day (BID) | INTRAVENOUS | Status: DC
  Administered 2020-03-24 – 2020-03-25 (×2): 3 mL via INTRAVENOUS

## 2020-03-24 MED ORDER — SODIUM CHLORIDE 0.9% FLUSH: 3.0000 mL | INTRAVENOUS | Status: DC | PRN

## 2020-03-24 MED ORDER — ONDANSETRON HCL 4 MG PO TABS: 4.0000 mg | ORAL_TABLET | Freq: Four times a day (QID) | ORAL | Status: DC | PRN

## 2020-03-24 MED ORDER — FERROUS SULFATE 325 (65 FE) MG PO TABS
325.0000 mg | ORAL_TABLET | Freq: Two times a day (BID) | ORAL | Status: DC
  Administered 2020-03-24 – 2020-03-25 (×2): 325 mg via ORAL
  Filled 2020-03-24 (×5): qty 1

## 2020-03-24 MED ORDER — SODIUM CHLORIDE 0.9 % IV SOLN: 250.0000 mL | INTRAVENOUS | Status: DC | PRN

## 2020-03-24 MED ORDER — FLUOXETINE HCL 20 MG PO CAPS
20.0000 mg | ORAL_CAPSULE | Freq: Every day | ORAL | Status: DC
  Filled 2020-03-24: qty 1

## 2020-03-24 MED ORDER — HYDROXYZINE PAMOATE 25 MG PO CAPS
25.0000 mg | ORAL_CAPSULE | Freq: Every evening | ORAL | Status: DC | PRN
  Filled 2020-03-24: qty 1

## 2020-03-24 MED ORDER — ONDANSETRON HCL 4 MG/2ML IJ SOLN: 4.0000 mg | Freq: Four times a day (QID) | INTRAMUSCULAR | Status: DC | PRN

## 2020-03-24 MED ORDER — ACETAMINOPHEN 325 MG PO TABS
650.0000 mg | ORAL_TABLET | Freq: Four times a day (QID) | ORAL | Status: DC | PRN
  Administered 2020-03-24: 650 mg via ORAL
  Filled 2020-03-24: qty 2

## 2020-03-24 MED ORDER — SODIUM CHLORIDE 0.9 % IV SOLN: 10.0000 mL/h | Freq: Once | INTRAVENOUS | Status: DC

## 2020-03-24 MED ORDER — FUROSEMIDE 10 MG/ML IJ SOLN: 20.0000 mg | Freq: Once | INTRAMUSCULAR | Status: DC

## 2020-03-24 MED ORDER — AMLODIPINE BESYLATE 5 MG PO TABS
5.0000 mg | ORAL_TABLET | Freq: Every day | ORAL | Status: DC
  Administered 2020-03-25: 5 mg via ORAL
  Filled 2020-03-24: qty 1

## 2020-03-24 MED ORDER — DULOXETINE HCL 30 MG PO CPEP
90.0000 mg | ORAL_CAPSULE | Freq: Every day | ORAL | Status: DC
  Filled 2020-03-24: qty 1

## 2020-03-24 NOTE — Consult Note (Signed)
Consult Note  Requesting Attending Physician :  Collier Bullock, MD  Assessment/Recommendations:  Dana Dunlap is a 49 y.o. female seen in consultation at the request of Agbata, Tochukwu, MD regarding menometrorrhagia with acute blood loss anemia likely secondary to a uterus enlarged by fibroids. Patient has a rather long history of menometrorrhagia and has undergone partial work-up before which included ultrasound, MRI, endometrial biopsy.  These were performed at another institution.  She was planning and embolization of her fibroids but never went through with it.  It has been suggested on a number of occasions that she have a hysterectomy and she is adamantly opposed to this option.  She informed me that her sister also had large uterine fibroids and she underwent embolization successfully.  Patient has previously received blood transfusions and iron infusions for her anemia.  She has tried progesterone including Depo-Provera for control of her bleeding without success .  The patient is somewhat dramatic, tearful, anxious, and concerned.  Principal Problem:   Symptomatic anemia Active Problems:   Anxiety and depression   Uterine leiomyoma   Hypertension  It is almost certain that her uterine fibroids are causing her anemia.  PLAN: The work-up for uterine fibroids causing anemia and treatment options are mostly outpatient.  1.  Recommend ultrasound at the office or institution where she plans her procedure.    This will delineate the current size and location of her uterine fibroids  2.  Recommend a current endometrial biopsy to rule out hyperplasia or malignancy causing bleeding.  3.  The options discussed in detail with the patient include progesterone containing compounds/systems the most likely to be effective at this point would be Mirena IUD, uterine fibroid embolization, endometrial ablation, and hysterectomy.  The patient has already declined hysterectomy and has expressed  her desire for embolization.  Once the patient receives a blood transfusion and is placed on oral iron (possible iron infusion too) she may be discharged from the hospital and I strongly recommend that before her next menses she begins the work-up for her subsequent procedure as noted above.   Medications:  No current facility-administered medications on file prior to encounter.   Current Outpatient Medications on File Prior to Encounter  Medication Sig Dispense Refill  . acetaminophen (TYLENOL) 325 MG tablet Take 650 mg by mouth every 6 (six) hours as needed.    Marland Kitchen amLODipine (NORVASC) 5 MG tablet Take 1 tablet (5 mg total) by mouth daily. 90 tablet 2  . DULoxetine (CYMBALTA) 30 MG capsule TAKE 3 CAPSULES BY MOUTH  DAILY (Patient taking differently: Take 90 mg by mouth daily.) 270 capsule 3  . ferrous sulfate (FEOSOL) 325 (65 FE) MG tablet Take 1 tablet (325 mg total) by mouth 2 (two) times daily with a meal. 180 tablet 3  . FLUoxetine (PROZAC) 20 MG capsule Take 20 mg by mouth daily.    . hydrOXYzine (VISTARIL) 25 MG capsule Take 25 mg by mouth at bedtime as needed (sleep).    Marland Kitchen ibuprofen (ADVIL) 200 MG tablet Take 200 mg by mouth every 6 (six) hours as needed.      Medical History: Past Medical History:  Diagnosis Date  . Depression    Currently on Cymbalta  . Eye abnormalities   . Fibroids   . Frequent headaches    Worse during monthly menstrual cycle  . Hypertension     Surgical History: Past Surgical History:  Procedure Laterality Date  . BREAST MASS EXCISION     benign  .  KNEE SURGERY       Obstetric History:  OB History  Gravida Para Term Preterm AB Living  1            SAB IAB Ectopic Multiple Live Births               # Outcome Date GA Lbr Len/2nd Weight Sex Delivery Anes PTL Lv  1 Gravida              Social History: Social History   Socioeconomic History  . Marital status: Single    Spouse name: Not on file  . Number of children: 0  . Years of  education: 16  . Highest education level: Bachelor's degree (e.g., BA, AB, BS)  Occupational History  . 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 Smoking status: Never Smoker  . Smokeless tobacco: Never Used  Vaping Use  . Vaping Use: Never used  Substance and Sexual Activity  . Alcohol use: Yes    Alcohol/week: 1.0 standard drink    Types: 1 Glasses of wine per week  . Drug use: No  . Sexual activity: Yes    Birth control/protection: Condom  Other Topics Concern  . Not on file  Social History Narrative   Dana Dunlap is originally from [place]. She attended [employer] in [place] where she obtained her Therapist, nutritional in Biology in 1995. 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 She is in a long term relationship with her boyfried, [name]. They have been together for 6 years. Dana Dunlap transferred to [facility] with [facility] in January. She enjoys reading and she enjoys the outdoors. She loves to travel. 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   Social Determinants of Health   Financial Resource Strain: Not on file  Food Insecurity: Not on file  Transportation Needs: Not on file  Physical Activity: Insufficiently Active  . Days of Exercise per Week: 2 days  . Minutes of Exercise per Session: 30 min  Stress: Not on file  Social Connections: Not on file    Family History: family history includes Cancer in her father; Diabetes in her paternal aunt; Hyperlipidemia in her father and mother; Hypertension in her father and mother; Sickle cell trait in an other family member; Stroke in her maternal aunt and paternal aunt; Thalassemia in an other family member.    Objective :  Vital signs in last 24 hours: Temp:  [98.7 F (37.1 C)-99.4 F (37.4 C)] 99.1 F (37.3 C) (12/22 1500) Pulse Rate:  [72-90] 78 (12/22 1530) Resp:  [15-20] 20 (12/22  1530) BP: (119-163)/(44-75) 139/72 (12/22 1530) SpO2:  [97 %-100 %] 97 % (12/22 1530) Weight:  [72.6 kg] 72.6 kg (12/22 1109)  [place] from 2019:  Measurements: 17.2 x 10.1 x 13.5 cm. Significantly enlarged. Multinodular appearance compatible with multiple uterine leiomyomata. Measured lesions include a 5.9 x 7.5 x 6.6 cm diameter fundal leiomyoma, a 2.9 x 3.5 x 4.0 cm posterior leiomyoma, and a 3.9 x 3.0 x 3.2 cm anterior leiomyoma.   Finis Bud, M.D. 03/24/2020 4:23 PM

## 2020-03-24 NOTE — ED Notes (Signed)
Pt presentation discussed with EDP, Siadecki; see new orders. 

## 2020-03-24 NOTE — H&P (Signed)
History and Physical    Prue Lingenfelter HQP:[id number] DOB: 1970-07-28 DOA: 03/24/2020  PCP: Leone Haven, MD   Patient coming from: Home  I have personally briefly reviewed patient's old medical records in [place]  Chief Complaint: Dizziness  HPI: Dana Dunlap is a 49 y.o. female with medical history significant for hypertension, history of menorrhagia from uterine fibroids, hypertension and depression for which she was recently started on Prozac.  She presents to the emergency room with complaints of dizziness, lightheadedness, palpitations and fatigue.  Patient states that she had a syncopal episode 3 days prior to her admission while in the shower, EMS was called and they evaluated her and did not feel there was a need for her to come to the hospital. She admits to having menorrhagia from her known history of fibroids and has had multiple consultations with gynecologists who have recommended uterine hysterectomy which she is opposed to and will rather have uterine artery embolization done instead.  Patient lost her job a couple of months ago and has not been able to follow-up with hematology for iron infusions and due to loss of her insurance. She denies having any hematemesis, no hematochezia or melena stools.  She denies NSAID use.  She denies having any fever or chills, no urinary symptoms, no abdominal pain, no cough, no headache, no diarrhea, no constipation, no diaphoresis. Labs show sodium 137, potassium 3.8, chloride 106, bicarb 23, glucose 101, BUN 12, creatinine 0.9, calcium 8.9, alkaline phosphatase 44, albumin 3.7, AST 16, ALT 11, total protein 6.9, white count 4, hemoglobin 3.6 compared to baseline of 8.9, hematocrit 13.6, MCV 58.1, RDW 23.9, platelet count 153 Twelve-lead EKG reviewed by me shows sinus rhythm with nonspecific ST changes.   ED Course: Patient presents to the emergency room for evaluation of dizziness, lightheadedness, fatigue and a syncopal episode  couple of days prior to her admission.  She is noted to have severe symptomatic blood loss anemia with a hemoglobin of 3g/dl.  She will be referred to observation status for further evaluation.  Review of Systems: As per HPI otherwise all systems reviewed and negative.    Past Medical History:  Diagnosis Date  . Depression    Currently on Cymbalta  . Eye abnormalities   . Fibroids   . Frequent headaches    Worse during monthly menstrual cycle  . Hypertension     Past Surgical History:  Procedure Laterality Date  . BREAST MASS EXCISION     benign  . KNEE SURGERY       reports that she has never smoked. She has never used smokeless tobacco. She reports current alcohol use of about 1.0 standard drink of alcohol per week. She reports that she does not use drugs.  Allergies  Allergen Reactions  . Compazine [Prochlorperazine Edisylate] Other (See Comments)    Seizure     Family History  Problem Relation Age of Onset  . Hypertension Mother   . Hyperlipidemia Mother   . Cancer Father        Prostate cancer  . Hypertension Father   . Hyperlipidemia Father   . Stroke Maternal Aunt   . Stroke Paternal Aunt   . Diabetes Paternal Aunt   . Sickle cell trait Other   . Thalassemia Other      Prior to Admission medications   Medication Sig Start Date End Date Taking? Authorizing Provider  acetaminophen (TYLENOL) 325 MG tablet Take 650 mg by mouth every 6 (six) hours as needed.  [provider]  amLODipine (NORVASC) 5 MG tablet Take 1 tablet (5 mg total) by mouth daily. 04/11/19   Leone Haven, MD  DULoxetine (CYMBALTA) 30 MG capsule TAKE 3 CAPSULES BY MOUTH  DAILY 05/29/19   Leone Haven, MD  ferrous sulfate (FEOSOL) 325 (65 FE) MG tablet Take 1 tablet (325 mg total) by mouth 2 (two) times daily with a meal. 01/22/18   Guse, Jacquelynn Cree, FNP  FLUoxetine (PROZAC) 20 MG capsule Take 20 mg by mouth daily.    [provider]  hydrOXYzine (VISTARIL) 25 MG  capsule Take 25 mg by mouth at bedtime as needed (sleep).    [provider]  ibuprofen (ADVIL) 200 MG tablet Take 200 mg by mouth every 6 (six) hours as needed.    [provider]    Physical Exam: Vitals:   03/24/20 1108 03/24/20 1109 03/24/20 1206  BP: (!) 138/47  (!) 132/59  Pulse: 87  84  Resp: 19  20  Temp: 99.4 F (37.4 C)    SpO2: 100%  100%  Weight:  72.6 kg   Height:  5' (1.524 m)      Vitals:   03/24/20 1108 03/24/20 1109 03/24/20 1206  BP: (!) 138/47  (!) 132/59  Pulse: 87  84  Resp: 19  20  Temp: 99.4 F (37.4 C)    SpO2: 100%  100%  Weight:  72.6 kg   Height:  5' (1.524 m)     Constitutional: NAD, alert and oriented x 3.  Appears very tearful Eyes: PERRL, lids and conjunctivae pallor ENMT: Mucous membranes are moist.  Neck: normal, supple, no masses, no thyromegaly Respiratory: clear to auscultation bilaterally, no wheezing, no crackles. Normal respiratory effort. No accessory muscle use.  Cardiovascular: Regular rate and rhythm, no murmurs / rubs / gallops. No extremity edema. 2+ pedal pulses. No carotid bruits.  Abdomen: no tenderness, no masses palpated. No hepatosplenomegaly. Bowel sounds positive.  Central adiposity Musculoskeletal: no clubbing / cyanosis. No joint deformity upper and lower extremities.  Skin: no rashes, lesions, ulcers.  Neurologic: No gross focal neurologic deficit. Psychiatric: Normal mood and affect.   Labs on Admission: I have personally reviewed following labs and imaging studies  CBC: Recent Labs  Lab 03/24/20 1114  WBC 4.0  HGB 3.6*  HCT 13.6*  MCV 58.1*  PLT 0000000   Basic Metabolic Panel: Recent Labs  Lab 03/24/20 1114  NA 137  K 3.8  CL 106  CO2 23  GLUCOSE 101*  BUN 12  CREATININE 0.99  CALCIUM 8.9   GFR: Estimated Creatinine Clearance: 59.8 mL/min (by C-G formula based on SCr of 0.99 mg/dL). Liver Function Tests: Recent Labs  Lab 03/24/20 1114  AST 16  ALT 11  ALKPHOS 44   BILITOT 0.3  PROT 6.9  ALBUMIN 3.7   No results for input(s): LIPASE, AMYLASE in the last 168 hours. No results for input(s): AMMONIA in the last 168 hours. Coagulation Profile: No results for input(s): INR, PROTIME in the last 168 hours. Cardiac Enzymes: No results for input(s): CKTOTAL, CKMB, CKMBINDEX, TROPONINI in the last 168 hours. BNP (last 3 results) No results for input(s): PROBNP in the last 8760 hours. HbA1C: No results for input(s): HGBA1C in the last 72 hours. CBG: No results for input(s): GLUCAP in the last 168 hours. Lipid Profile: No results for input(s): CHOL, HDL, LDLCALC, TRIG, CHOLHDL, LDLDIRECT in the last 72 hours. Thyroid Function Tests: No results for input(s): TSH, T4TOTAL, FREET4, T3FREE,  THYROIDAB in the last 72 hours. Anemia Panel: No results for input(s): VITAMINB12, FOLATE, FERRITIN, TIBC, IRON, RETICCTPCT in the last 72 hours. Urine analysis:    Component Value Date/Time   COLORURINE RED (A) 12/11/2018 1455   APPEARANCEUR Clear 01/08/2019 0918   LABSPEC 1.006 12/11/2018 1455   PHURINE  12/11/2018 1455    TEST NOT REPORTED DUE TO COLOR INTERFERENCE OF URINE PIGMENT   GLUCOSEU Negative 01/08/2019 0918   HGBUR (A) 12/11/2018 1455    TEST NOT REPORTED DUE TO COLOR INTERFERENCE OF URINE PIGMENT   BILIRUBINUR Negative 01/08/2019 0918   KETONESUR (A) 12/11/2018 1455    TEST NOT REPORTED DUE TO COLOR INTERFERENCE OF URINE PIGMENT   PROTEINUR 2+ (A) 01/08/2019 0918   PROTEINUR (A) 12/11/2018 1455    TEST NOT REPORTED DUE TO COLOR INTERFERENCE OF URINE PIGMENT   UROBILINOGEN 1.0 12/10/2018 1636   NITRITE Negative 01/08/2019 0918   NITRITE (A) 12/11/2018 1455    TEST NOT REPORTED DUE TO COLOR INTERFERENCE OF URINE PIGMENT   LEUKOCYTESUR Negative 01/08/2019 0918   LEUKOCYTESUR (A) 12/11/2018 1455    TEST NOT REPORTED DUE TO COLOR INTERFERENCE OF URINE PIGMENT    Radiological Exams on Admission: No results found.  EKG: Independently reviewed.   Sinus rhythm Nonspecific ST changes  Assessment/Plan Principal Problem:   Symptomatic anemia Active Problems:   Anxiety and depression   Uterine leiomyoma   Hypertension     Symptomatic anemia Secondary to chronic blood loss from menorrhagia Hemoglobin on admission is 3.6g/dl We will type and crossmatch and will transfuse 2 units of packed RBC We will request GYN consult Start patient on iron supplements    History of uterine leiomyoma Patient has a history of uterine fibroids and severe menorrhagia secondary to her known fibroids Her menstrual cycle usually last about 2 weeks She has been seen by gynecology and they have recommended a hysterectomy which patient is supposed to and will want to discuss other options with gynecology Consult has been placed    Anxiety and depression Secondary to recent stressors including job loss She denies having any suicidal ideations Continue Cymbalta and Prozac    Hypertension Maintain low-sodium diet Continue amlodipine     DVT prophylaxis: SCD Code Status: Full code Family Communication: Greater than 50% of time was spent discussing plan of care with patient at the bedside.  All questions and concerns have been addressed.  She verbalizes understanding and agrees with the plan. Disposition Plan: Back to previous home environment Consults called: Gynecology    Collier Bullock MD [facility]     03/24/2020, 1:08 PM

## 2020-03-24 NOTE — ED Provider Notes (Signed)
[facility] Emergency Department Provider Note   ____________________________________________   Event Date/Time   First MD Initiated Contact with Patient 03/24/20 1225     (approximate)  I have reviewed the triage vital signs and the nursing notes.   HISTORY  Chief Complaint Allergic Reaction    HPI Dana Dunlap is a 49 y.o. female stated past medical history of depression, hypertension, and fibroids that cause her to have heavy periods as well as symptomatic anemia necessitating iron infusions who presents for worsening headaches, lightheadedness, palpitations, and dyspnea on exertion over the last 3 months.  Patient states the symptoms acutely worsened over the last 3 weeks when she started her Prozac and believe that she was having an adverse reaction to the Prozac itself.  Patient also states that the symptoms acutely worsened after her last menstrual cycle.  Denies any exacerbating or relieving factors.  Patient currently denies any vision changes, tinnitus, difficulty speaking, facial droop, sore throat, abdominal pain, nausea/vomiting/diarrhea, dysuria, or numbness/paresthesias in any extremity         Past Medical History:  Diagnosis Date  . Depression    Currently on Cymbalta  . Eye abnormalities   . Fibroids   . Frequent headaches    Worse during monthly menstrual cycle  . Hypertension     Patient Active Problem List   Diagnosis Date Noted  . Hypertension   . 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 Primary osteoarthritis of left knee 12/04/2018  . Abnormal uterine bleeding (AUB) 11/27/2018  . Uterine leiomyoma 11/27/2018  . Left knee pain 08/05/2018  . Other neutropenia ([place]) 08/05/2018  . Symptomatic anemia 01/23/2018  . Right knee pain 01/09/2017  . Hyperlipidemia 06/27/2016  . Thrombocytosis 03/22/2016  . Anxiety and depression 03/21/2016  . Excessive and frequent menstruation 08/21/2013  . Fibroid, uterine  07/28/2013  . Iron deficiency anemia due to chronic blood loss 07/28/2013  . Elevated blood pressure reading 03/26/2013    Past Surgical History:  Procedure Laterality Date  . BREAST MASS EXCISION     benign  . KNEE SURGERY      Prior to Admission medications   Medication Sig Start Date End Date Taking? Authorizing Provider  acetaminophen (TYLENOL) 325 MG tablet Take 650 mg by mouth every 6 (six) hours as needed.    [provider]  amLODipine (NORVASC) 5 MG tablet Take 1 tablet (5 mg total) by mouth daily. 04/11/19   Leone Haven, MD  DULoxetine (CYMBALTA) 30 MG capsule TAKE 3 CAPSULES BY MOUTH  DAILY 05/29/19   Leone Haven, MD  ferrous sulfate (FEOSOL) 325 (65 FE) MG tablet Take 1 tablet (325 mg total) by mouth 2 (two) times daily with a meal. 01/22/18   Guse, Jacquelynn Cree, FNP  FLUoxetine (PROZAC) 20 MG capsule Take 20 mg by mouth daily.    [provider]  hydrOXYzine (VISTARIL) 25 MG capsule Take 25 mg by mouth at bedtime as needed (sleep).    [provider]  ibuprofen (ADVIL) 200 MG tablet Take 200 mg by mouth every 6 (six) hours as needed.    [provider]    Allergies Compazine [prochlorperazine edisylate]  Family History  Problem Relation Age of Onset  . Hypertension Mother   . Hyperlipidemia Mother   . Cancer Father        Prostate cancer  . Hypertension Father   . Hyperlipidemia Father   . Stroke Maternal Aunt   . Stroke Paternal Aunt   .  Diabetes Paternal Aunt   . Sickle cell trait Other   . Thalassemia Other     Social History Social History   Tobacco Use  . Smoking status: Never Smoker  . Smokeless tobacco: Never Used  Vaping Use  . Vaping Use: Never used  Substance Use Topics  . Alcohol use: Yes    Alcohol/week: 1.0 standard drink    Types: 1 Glasses of wine per week  . Drug use: No    Review of Systems Constitutional: No fever/chills Eyes: No visual changes. ENT: No sore throat. Cardiovascular:  Endorses chest pain. Respiratory: Endorses shortness of breath. Gastrointestinal: No abdominal pain.  No nausea, no vomiting.  No diarrhea. Genitourinary: Negative for dysuria. Musculoskeletal: Negative for acute arthralgias Skin: Negative for rash. Neurological: Negative for headaches, endorses acute generalized weakness, denies numbness/paresthesias in any extremity Psychiatric: Negative for suicidal ideation/homicidal ideation   ____________________________________________   PHYSICAL EXAM:  VITAL SIGNS: ED Triage Vitals  Enc Vitals Group     BP 03/24/20 1108 (!) 138/47     Pulse Rate 03/24/20 1108 87     Resp 03/24/20 1108 19     Temp 03/24/20 1108 99.4 F (37.4 C)     Temp src --      SpO2 03/24/20 1108 100 %     Weight 03/24/20 1109 160 lb (72.6 kg)     Height 03/24/20 1109 5' (1.524 m)     Head Circumference --      Peak Flow --      Pain Score 03/24/20 1109 7     Pain Loc --      Pain Edu? --      Excl. in [place]? --    Constitutional: Alert and oriented. Well appearing and in no acute distress. Eyes: Conjunctivae are normal. PERRL. Head: Atraumatic. Nose: No congestion/rhinnorhea. Mouth/Throat: Mucous membranes are moist. Neck: No stridor Cardiovascular: Grossly normal heart sounds.  Good peripheral circulation. Respiratory: Normal respiratory effort.  No retractions. Gastrointestinal: Soft and nontender. No distention. Musculoskeletal: No obvious deformities Neurologic:  Normal speech and language. No gross focal neurologic deficits are appreciated. Skin:  Skin is warm and dry. No rash noted. Psychiatric: Mood and affect are normal. Speech and behavior are normal.  ____________________________________________   LABS (all labs ordered are listed, but only abnormal results are displayed)  Labs Reviewed  CBC - Abnormal; Notable for the following components:      Result Value   RBC 2.34 (*)    Hemoglobin 3.6 (*)    HCT 13.6 (*)    MCV 58.1 (*)    MCH 15.4  (*)    MCHC 26.5 (*)    RDW 23.9 (*)    All other components within normal limits  COMPREHENSIVE METABOLIC PANEL - Abnormal; Notable for the following components:   Glucose, Bld 101 (*)    All other components within normal limits  RESP PANEL BY RT-PCR (FLU A&B, COVID) ARPGX2  IRON AND TIBC  HIV ANTIBODY (ROUTINE TESTING W REFLEX)  TYPE AND SCREEN  PREPARE RBC (CROSSMATCH)   ____________________________________________  EKG  ED ECG REPORT I, Naaman Plummer, the attending physician, personally viewed and interpreted this ECG.  Date: 03/24/2020 EKG Time: 1038 Rate: 89 Rhythm: normal sinus rhythm QRS Axis: normal Intervals: normal ST/T Wave abnormalities: normal Narrative Interpretation: no evidence of acute ischemia   PROCEDURES  Procedure(s) performed (including Critical Care):  .1-3 Lead EKG Interpretation Performed by: Naaman Plummer, MD Authorized by: Naaman Plummer, MD  Interpretation: normal     ECG rate:  84   ECG rate assessment: normal     Rhythm: sinus rhythm     Ectopy: none     Conduction: normal   .Critical Care Performed by: Naaman Plummer, MD Authorized by: Naaman Plummer, MD   Critical care provider statement:    Critical care time (minutes):  35   Critical care time was exclusive of:  Separately billable procedures and treating other patients   Critical care was necessary to treat or prevent imminent or life-threatening deterioration of the following conditions:  Circulatory failure   Critical care was time spent personally by me on the following activities:  Discussions with consultants, evaluation of patient's response to treatment, examination of patient, ordering and performing treatments and interventions, ordering and review of laboratory studies, ordering and review of radiographic studies, pulse oximetry, re-evaluation of patient's condition, obtaining history from patient or surrogate and review of old charts   I assumed direction of  critical care for this patient from another provider in my specialty: no     Care discussed with: admitting provider       ____________________________________________   INITIAL IMPRESSION / ASSESSMENT AND PLAN / ED COURSE  As part of my medical decision making, I reviewed the following data within the [facility] notes reviewed and incorporated, Labs reviewed, EKG interpreted, Old chart reviewed, and Notes from prior ED visits reviewed and incorporated     Patient presents with symptoms of symptomatic anemia with a hemoglobin of 3.6.  This anemia likely secondary to heavy menstrual cycle and lack of iron infusions as she has been unable to get to her hematologist's office for the past few months.  Patient ordered 3 units of packed red blood cells for transfusion.  Given patient's significant anemia, she will require admission for further stabilization.  Patient agrees with this plan.  No active bleeding at this time.  Dispo: Admit       ____________________________________________   FINAL CLINICAL IMPRESSION(S) / ED DIAGNOSES  Final diagnoses:  Symptomatic anemia     ED Discharge Orders    None       Note:  This document was prepared using Dragon voice recognition software and may include unintentional dictation errors.   Naaman Plummer, MD 03/24/20 1330

## 2020-03-24 NOTE — ED Triage Notes (Signed)
Pt in via POV, reports starting on Prozac approximately 3 weeks ago, feels like she is having a reaction to the medication.  Reports feeling light headed with dizzy spells since then, having syncopal episode 3 days ago, also reports heart palpitations.    Pt appears very anxious in triage, tearful at times.

## 2020-03-24 NOTE — ED Notes (Addendum)
Placed in treatment room, Dr. Cheri Fowler at bedside aware of critical hemoglobin 3.6.

## 2020-03-25 DIAGNOSIS — I10 Essential (primary) hypertension: Secondary | ICD-10-CM

## 2020-03-25 DIAGNOSIS — F5089 Other specified eating disorder: Secondary | ICD-10-CM

## 2020-03-25 DIAGNOSIS — F32A Depression, unspecified: Secondary | ICD-10-CM

## 2020-03-25 DIAGNOSIS — D219 Benign neoplasm of connective and other soft tissue, unspecified: Secondary | ICD-10-CM

## 2020-03-25 DIAGNOSIS — D500 Iron deficiency anemia secondary to blood loss (chronic): Secondary | ICD-10-CM

## 2020-03-25 DIAGNOSIS — F419 Anxiety disorder, unspecified: Secondary | ICD-10-CM

## 2020-03-25 LAB — BASIC METABOLIC PANEL
Anion gap: 7 (ref 5–15)
BUN: 8 mg/dL (ref 6–20)
CO2: 23 mmol/L (ref 22–32)
Calcium: 8.4 mg/dL — ABNORMAL LOW (ref 8.9–10.3)
Chloride: 107 mmol/L (ref 98–111)
Creatinine, Ser: 0.85 mg/dL (ref 0.44–1.00)
GFR, Estimated: >60 mL/min (ref >60)
Glucose, Bld: 95 mg/dL (ref 70–99)
Potassium: 3.9 mmol/L (ref 3.5–5.1)
Sodium: 137 mmol/L (ref 135–145)

## 2020-03-25 LAB — CBC
HCT: 21.5 % — ABNORMAL LOW (ref 36.0–46.0)
HCT: 23.3 % — ABNORMAL LOW (ref 36.0–46.0)
Hemoglobin: 6.7 g/dL — ABNORMAL LOW (ref 12.0–15.0)
Hemoglobin: 7.2 g/dL — ABNORMAL LOW (ref 12.0–15.0)
MCH: 21.6 pg — ABNORMAL LOW (ref 26.0–34.0)
MCH: 21.4 pg — ABNORMAL LOW (ref 26.0–34.0)
MCHC: 31.2 g/dL (ref 30.0–36.0)
MCHC: 30.9 g/dL (ref 30.0–36.0)
MCV: 69.4 fL — ABNORMAL LOW (ref 80.0–100.0)
MCV: 69.3 fL — ABNORMAL LOW (ref 80.0–100.0)
Platelets: 121 10*3/uL — ABNORMAL LOW (ref 150–400)
Platelets: 133 10*3/uL — ABNORMAL LOW (ref 150–400)
RBC: 3.1 MIL/uL — ABNORMAL LOW (ref 3.87–5.11)
RBC: 3.36 MIL/uL — ABNORMAL LOW (ref 3.87–5.11)
WBC: 3.9 10*3/uL — ABNORMAL LOW (ref 4.0–10.5)
WBC: 4.2 10*3/uL (ref 4.0–10.5)
nRBC: 0 % (ref 0.0–0.2)
nRBC: 0 % (ref 0.0–0.2)

## 2020-03-25 LAB — TECHNOLOGIST SMEAR REVIEW: Plt Morphology: NORMAL

## 2020-03-25 LAB — LACTATE DEHYDROGENASE: LDH: 140 U/L (ref 98–192)

## 2020-03-25 LAB — FERRITIN: Ferritin: 5 ng/mL — ABNORMAL LOW (ref 11–307)

## 2020-03-25 LAB — PATHOLOGIST SMEAR REVIEW

## 2020-03-25 LAB — MAGNESIUM: Magnesium: 2.1 mg/dL (ref 1.7–2.4)

## 2020-03-25 LAB — PREPARE RBC (CROSSMATCH)

## 2020-03-25 LAB — VITAMIN B12: Vitamin B-12: 466 pg/mL (ref 180–914)

## 2020-03-25 LAB — HIV ANTIBODY (ROUTINE TESTING W REFLEX): HIV Screen 4th Generation wRfx: NONREACTIVE

## 2020-03-25 MED ORDER — ACETAMINOPHEN 325 MG PO TABS
650.0000 mg | ORAL_TABLET | Freq: Once | ORAL | Status: AC
  Administered 2020-03-25: 11:00:00 650 mg via ORAL
  Filled 2020-03-25: qty 2

## 2020-03-25 MED ORDER — SODIUM CHLORIDE 0.9% IV SOLUTION: Freq: Once | INTRAVENOUS | Status: AC

## 2020-03-25 MED ORDER — LORAZEPAM 1 MG PO TABS
1.0000 mg | ORAL_TABLET | Freq: Once | ORAL | Status: AC
  Administered 2020-03-25: 05:00:00 1 mg via ORAL
  Filled 2020-03-25: qty 1

## 2020-03-25 MED ORDER — SODIUM CHLORIDE 0.9 % IV SOLN
400.0000 mg | Freq: Once | INTRAVENOUS | Status: AC
  Administered 2020-03-25: 11:00:00 400 mg via INTRAVENOUS
  Filled 2020-03-25: qty 20

## 2020-03-25 MED ORDER — TRAZODONE HCL 50 MG PO TABS: 50.0000 mg | ORAL_TABLET | Freq: Every day | ORAL | 0 refills | Status: DC

## 2020-03-25 MED ORDER — DULOXETINE HCL 30 MG PO CPEP: ORAL_CAPSULE | ORAL | 0 refills | Status: DC

## 2020-03-25 MED ORDER — AMLODIPINE BESYLATE 5 MG PO TABS: 5.0000 mg | ORAL_TABLET | Freq: Every day | ORAL | 0 refills | Status: DC

## 2020-03-25 MED ORDER — FERROUS SULFATE 325 (65 FE) MG PO TABS: 325.0000 mg | ORAL_TABLET | Freq: Two times a day (BID) | ORAL | 0 refills | Status: DC

## 2020-03-25 NOTE — Discharge Summary (Signed)
[place] [facility] [place] NAME: Dana Dunlap    MR#:  [id number]  DATE OF BIRTH:  1971/03/31  DATE OF ADMISSION:  03/24/2020 ADMITTING PHYSICIAN: Collier Bullock, MD  DATE OF DISCHARGE: 03/25/2020  PRIMARY CARE PHYSICIAN: Leone Haven, MD    ADMISSION DIAGNOSIS:  Symptomatic anemia [D64.9]  DISCHARGE DIAGNOSIS:  Principal Problem:   Symptomatic anemia Active Problems:   Anxiety and depression   Uterine leiomyoma   Hypertension   SECONDARY DIAGNOSIS:   Past Medical History:  Diagnosis Date  . Depression    Currently on Cymbalta  . Eye abnormalities   . Fibroids   . Frequent headaches    Worse during monthly menstrual cycle  . Hypertension     HOSPITAL COURSE:   1.  Symptomatic anemia with syncope.  Patient had a hemoglobin of 3.6 on admission.  Was given 2 units of packed red blood cells and hemoglobin did come up to 7.2.  I ordered a another unit of blood so a total of 3 units of blood given during the hospitalization. 2.  Severe iron deficiency anemia.  I did order 400 mg of IV Venofer to be given.  Will also give oral iron upon disposition.  We will have to follow-up with hematology as outpatient for iron infusions.  The patient called me back into the room to inspect the bag of the iron infusion.  Case discussed with the nursing staff and they spoke with the pharmacy staff about therapy always being a little residual medication in the bag.  I assured the patient she did get a good dose of the IV iron. 3.  Uterine fibroids and anemia.  Seen by Dr. Amalia Hailey who recommended outpatient follow-up.  Definitive treatment will be needed by gynecology to self her issue of anemia. 4.  Pica.  The patient does eat 2 bags of ice a day.  I advised her to stop eating ice. 5.  Anxiety and depression.  The patient does have stressors including job loss and insurance loss.  The patient did not want to go back on the Prozac and wanted to go back on  Cymbalta.  Patient also having trouble sleeping so I did prescribe trazodone at night. 6.  Essential hypertension I refilled Norvasc.  DISCHARGE CONDITIONS:   Satisfactory  CONSULTS OBTAINED:  Gynecology  DRUG ALLERGIES:   Allergies  Allergen Reactions  . Prochlorperazine Anaphylaxis and Other (See Comments)    Seizure Seizure  . Compazine [Prochlorperazine Edisylate] Other (See Comments)    Seizure   . Other     Sneezing and red eyes  . Tramadol Hives    DISCHARGE MEDICATIONS:   Allergies as of 03/25/2020      Reactions   Prochlorperazine Anaphylaxis, Other (See Comments)   Seizure Seizure   Compazine [prochlorperazine Edisylate] Other (See Comments)   Seizure   Other    Sneezing and red eyes   Tramadol Hives      Medication List    STOP taking these medications   FLUoxetine 20 MG capsule Commonly known as: PROZAC   hydrOXYzine 25 MG capsule Commonly known as: VISTARIL   ibuprofen 200 MG tablet Commonly known as: ADVIL     TAKE these medications   acetaminophen 325 MG tablet Commonly known as: TYLENOL Take 650 mg by mouth every 6 (six) hours as needed.   amLODipine 5 MG tablet Commonly known as: NORVASC Take 1 tablet (5 mg total) by mouth daily.   DULoxetine  30 MG capsule Commonly known as: Cymbalta Three tablets po daily What changed:   how much to take  how to take this  when to take this  additional instructions   ferrous sulfate 325 (65 FE) MG tablet Commonly known as: Feosol Take 1 tablet (325 mg total) by mouth 2 (two) times daily with a meal.   traZODone 50 MG tablet Commonly known as: DESYREL Take 1 tablet (50 mg total) by mouth at bedtime.        DISCHARGE INSTRUCTIONS:   Follow-up [facility] Referred her back to Dr. Grayland Ormond in hematology oncology for iron infusions Refer to Dr. Amalia Hailey gynecology for definitive management of fibroid uterus and bleeding.  If you experience worsening of your admission symptoms,  develop shortness of breath, life threatening emergency, suicidal or homicidal thoughts you must seek medical attention immediately by calling 911 or calling your MD immediately  if symptoms less severe.  You Must read complete instructions/literature along with all the possible adverse reactions/side effects for all the Medicines you take and that have been prescribed to you. Take any new Medicines after you have completely understood and accept all the possible adverse reactions/side effects.   Please note  You were cared for by a hospitalist during your hospital stay. If you have any questions about your discharge medications or the care you received while you were in the hospital after you are discharged, you can call the unit and asked to speak with the hospitalist on call if the hospitalist that took care of you is not available. Once you are discharged, your primary care physician will handle any further medical issues. Please note that NO REFILLS for any discharge medications will be authorized once you are discharged, as it is imperative that you return to your primary care physician (or establish a relationship with a primary care physician if you do not have one) for your aftercare needs so that they can reassess your need for medications and monitor your lab values.    Today   CHIEF COMPLAINT:   Dizziness HISTORY OF PRESENT ILLNESS:  Dana Dunlap  is a 49 y.o. female with a known history of fibroids and vaginal bleeding presents with dizziness.  Found to have a low hemoglobin.   VITAL SIGNS:  Blood pressure (!) 108/56, pulse 72, temperature 98.1 F (36.7 C), temperature source Axillary, resp. rate 15, height 5' (1.524 m), weight 72.6 kg, SpO2 100 %.  I/O:    Intake/Output Summary (Last 24 hours) at 03/25/2020 1534 Last data filed at 03/25/2020 1358 Gross per 24 hour  Intake 1626.58 ml  Output --  Net 1626.58 ml    PHYSICAL EXAMINATION:  GENERAL:  49 y.o.-year-old  patient lying in the bed with no acute distress.  EYES: Pupils equal, round, reactive to light and accommodation. No scleral icterus. HEENT: Head atraumatic, normocephalic. Oropharynx and nasopharynx clear.  LUNGS: Normal breath sounds bilaterally, no wheezing, rales,rhonchi or crepitation. No use of accessory muscles of respiration.  CARDIOVASCULAR: S1, S2 normal. No murmurs, rubs, or gallops.  ABDOMEN: Soft, non-tender.  EXTREMITIES: No pedal edema.  NEUROLOGIC: Cranial nerves II through XII are intact. Muscle strength 5/5 in all extremities. Sensation intact. Gait not checked.  PSYCHIATRIC: The patient is alert and oriented x 3.  SKIN: No obvious rash, lesion, or ulcer.   DATA REVIEW:   CBC Recent Labs  Lab 03/25/20 0857  WBC 4.2  HGB 7.2*  HCT 23.3*  PLT 133*    Chemistries  Recent Labs  Lab 03/24/20 1114 03/25/20 0857  NA 137  --   K 3.8  --   CL 106  --   CO2 23  --   GLUCOSE 101*  --   BUN 12  --   CREATININE 0.99  --   CALCIUM 8.9  --   MG  --  2.1  AST 16  --   ALT 11  --   ALKPHOS 44  --   BILITOT 0.3  --     Microbiology Results  Results for orders placed or performed during the hospital encounter of 03/24/20  Resp Panel by RT-PCR (Flu A&B, Covid) Nasopharyngeal Swab     Status: None   Collection Time: 03/24/20  3:13 PM   Specimen: Nasopharyngeal Swab; Nasopharyngeal(NP) swabs in vial transport medium  Result Value Ref Range Status   SARS Coronavirus 2 by RT PCR NEGATIVE NEGATIVE Final    Comment: (NOTE) SARS-CoV-2 target nucleic acids are NOT DETECTED.  The SARS-CoV-2 RNA is generally detectable in upper respiratory specimens during the acute phase of infection. The lowest concentration of SARS-CoV-2 viral copies this assay can detect is 138 copies/mL. A negative result does not preclude SARS-Cov-2 infection and should not be used as the sole basis for treatment or other patient management decisions. A negative result may occur with  improper  specimen collection/handling, submission of specimen other than nasopharyngeal swab, presence of viral mutation(s) within the areas targeted by this assay, and inadequate number of viral copies(<138 copies/mL). A negative result must be combined with clinical observations, patient history, and epidemiological information. The expected result is Negative.  Fact Sheet for Patients:  EntrepreneurPulse.com.au  Fact Sheet for Healthcare Providers:  IncredibleEmployment.be  This test is no t yet approved or cleared by the [place] FDA and  has been authorized for detection and/or diagnosis of SARS-CoV-2 by FDA under an Emergency Use Authorization (EUA). This EUA will remain  in effect (meaning this test can be used) for the duration of the COVID-19 declaration under Section 564(b)(1) of the Act, 21 U.S.C.section 360bbb-3(b)(1), unless the authorization is terminated  or revoked sooner.       Influenza A by PCR NEGATIVE NEGATIVE Final   Influenza B by PCR NEGATIVE NEGATIVE Final    Comment: (NOTE) The Xpert Xpress SARS-CoV-2/FLU/RSV plus assay is intended as an aid in the diagnosis of influenza from Nasopharyngeal swab specimens and should not be used as a sole basis for treatment. Nasal washings and aspirates are unacceptable for Xpert Xpress SARS-CoV-2/FLU/RSV testing.  Fact Sheet for Patients: EntrepreneurPulse.com.au  Fact Sheet for Healthcare Providers: IncredibleEmployment.be  This test is not yet approved or cleared by the [place] FDA and has been authorized for detection and/or diagnosis of SARS-CoV-2 by FDA under an Emergency Use Authorization (EUA). This EUA will remain in effect (meaning this test can be used) for the duration of the COVID-19 declaration under Section 564(b)(1) of the Act, 21 U.S.C. section 360bbb-3(b)(1), unless the authorization is terminated or revoked.  Performed at  [facility], [address], [place], [place] [address]      Management plans discussed with the patient, and she is in agreement.  CODE STATUS:     Code Status Orders  (From admission, onward)         Start     Ordered   03/24/20 1315  Full code  Continuous        03/24/20 1317        Code Status History  Date Active Date Inactive Code Status Order ID Comments User Context   01/23/2018 1341 01/24/2018 1217 Full Code [id number]  Gae Dry, MD ED   03/22/2016 0208 03/22/2016 2012 Full Code [id number]  Lance Coon, MD Inpatient   Advance Care Planning Activity      TOTAL TIME TAKING CARE OF THIS PATIENT: 35 minutes.    Loletha Grayer M.D on 03/25/2020 at 3:34 PM  Between 7am to 6pm - Pager - [phone]  After 6pm go to [email or web address] - password [email or web address]  [facility]  CC: Primary care physician; Leone Haven, MD

## 2020-03-25 NOTE — Progress Notes (Addendum)
Iron sucrose infusion competed there was still small amount of liquid in bag after pt received 250ML/400mg . Pt was concerned that she needs all the liquid. I reinforced that pt was giving the correct amount that ordered by MD. reminder in the bag was extra that pharmacy mixed up. Pt received the correct dose that was ordered. Pt sill hesitant and states at her other DR she gets the whole bag. Informed pt I can not account for what happens at other places however she go the correct does that was ordered. This Probation officer called pharmacy to confirm there is overfill in the bag of IRON Sucrose. So there will be some residual in the bag after infusion is complete.

## 2020-03-25 NOTE — TOC Initial Note (Addendum)
Transition of Care [facility]) - Initial/Assessment Note    Patient Details  Name: Dana Dunlap MRN: [id number] Date of Birth: [birth year]  Transition of Care [facility]) CM/SW Contact:    Magnus Ivan, LCSW Phone Number: 03/25/2020, 11:21 AM  Clinical Narrative:          CSW met with patient at bedside. Patient lives alone and drives herself to appointments. Patient is uninsured. She reported she has been using [facility]. Patient has been trying to find a PCP. Discussed resource options, patient interested in [facility]. Referral made. Patient would also like to apply for Medicaid. CSW made referral to Financial Counseling to follow up on eligibility/application.   1:45- CSW informed by MD patient may discharge this afternoon, meds sent to [facility].  2:50- Meds picked up by [facility] Assistant and this CSW gave to RN to provide to patient at discharge.    Expected Discharge Plan: Home/Self Care Barriers to Discharge: Continued Medical Work up   Patient Goals and CMS Choice Patient states their goals for this hospitalization and ongoing recovery are:: to get medical help CMS Medicare.gov Compare Post Acute Care list provided to:: Patient Choice offered to / list presented to : Patient  Expected Discharge Plan and Services Expected Discharge Plan: Home/Self Care       Living arrangements for the past 2 months: Single Family Home                                      Prior Living Arrangements/Services Living arrangements for the past 2 months: Single Family Home Lives with:: Self Patient language and need for interpreter reviewed:: Yes Do you feel safe going back to the place where you live?: Yes      Need for Family Participation in Patient Care: Yes (Comment) Care giver support system in place?: Yes (comment)   Criminal Activity/Legal Involvement Pertinent to Current Situation/Hospitalization: No - Comment as needed  Activities of Daily  Living Home Assistive Devices/Equipment: Cane (specify quad or straight) ADL Screening (condition at time of admission) Patient's cognitive ability adequate to safely complete daily activities?: Yes Is the patient deaf or have difficulty hearing?: No Does the patient have difficulty seeing, even when wearing glasses/contacts?: No Does the patient have difficulty concentrating, remembering, or making decisions?: No Patient able to express need for assistance with ADLs?: Yes Does the patient have difficulty dressing or bathing?: No Independently performs ADLs?: Yes (appropriate for developmental age) Does the patient have difficulty walking or climbing stairs?: Yes Weakness of Legs: Left Weakness of Arms/Hands: None  Permission Sought/Granted Permission sought to share information with : Chartered certified accountant granted to share information with : Yes, Verbal Permission Granted     Permission granted to share info w AGENCY: [facility], [facility], Financial Counselor        Emotional Assessment       Orientation: : Oriented to Self,Oriented to Place,Oriented to  Time,Oriented to Situation Alcohol / Substance Use: Not Applicable Psych Involvement: No (comment)  Admission diagnosis:  Symptomatic anemia [D64.9] Patient Active Problem List   Diagnosis Date Noted  . Hypertension   . 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 Primary osteoarthritis of left knee 12/04/2018  . Abnormal uterine bleeding (AUB) 11/27/2018  . Uterine leiomyoma 11/27/2018  . Left knee pain 08/05/2018  . Other neutropenia ([place]) 08/05/2018  . Symptomatic anemia 01/23/2018  . Right knee  pain 01/09/2017  . Hyperlipidemia 06/27/2016  . Thrombocytosis 03/22/2016  . Anxiety and depression 03/21/2016  . Excessive and frequent menstruation 08/21/2013  . Fibroid, uterine 07/28/2013  . Iron deficiency anemia due to chronic blood loss 07/28/2013  . Elevated blood pressure reading  03/26/2013   PCP:  Leone Haven, MD Pharmacy:   [facility], [address], [address] [place], [place] [address] Phone: [phone] Fax: [place] [address] [place], [place] [address] Phone: [phone] Fax: [phone]     Social Determinants of Health (SDOH) Interventions    Readmission Risk Interventions No flowsheet data found.

## 2020-03-26 LAB — TYPE AND SCREEN
ABO/RH(D): O POS
Antibody Screen: NEGATIVE
Unit division: 0
Unit division: 0
Unit division: 0
Unit division: 0

## 2020-03-26 LAB — BPAM RBC
Blood Product Expiration Date: 202201212359
Blood Product Expiration Date: 202201222359
Blood Product Expiration Date: 202201222359
Blood Product Expiration Date: 202201222359
ISSUE DATE / TIME: 202112221416
ISSUE DATE / TIME: 202112221815
ISSUE DATE / TIME: 202112230255
ISSUE DATE / TIME: 202112231305
Unit Type and Rh: 5100
Unit Type and Rh: 5100
Unit Type and Rh: 5100
Unit Type and Rh: 5100

## 2020-03-28 ENCOUNTER — Emergency Department
Admission: EM | Admit: 2020-03-28 | Discharge: 2020-03-29 | Disposition: A | Discharge status: Home / Self Care | Payer: Medicaid Other | Attending: Emergency Medicine | Admitting: Emergency Medicine

## 2020-03-28 ENCOUNTER — Other Ambulatory Visit: Payer: Self-pay

## 2020-03-28 ENCOUNTER — Encounter: Payer: Self-pay | Admitting: Emergency Medicine

## 2020-03-28 DIAGNOSIS — Z79899 Other long term (current) drug therapy: Secondary | ICD-10-CM | POA: Insufficient documentation

## 2020-03-28 DIAGNOSIS — I10 Essential (primary) hypertension: Secondary | ICD-10-CM | POA: Insufficient documentation

## 2020-03-28 DIAGNOSIS — B951 Streptococcus, group B, as the cause of diseases classified elsewhere: Secondary | ICD-10-CM | POA: Insufficient documentation

## 2020-03-28 DIAGNOSIS — R310 Gross hematuria: Secondary | ICD-10-CM | POA: Insufficient documentation

## 2020-03-28 LAB — COMPREHENSIVE METABOLIC PANEL
ALT: 11 U/L (ref 0–44)
AST: 40 U/L (ref 15–41)
Albumin: 3.6 g/dL (ref 3.5–5.0)
Alkaline Phosphatase: 50 U/L (ref 38–126)
Anion gap: 6 (ref 5–15)
BUN: 12 mg/dL (ref 6–20)
CO2: 24 mmol/L (ref 22–32)
Calcium: 9.2 mg/dL (ref 8.9–10.3)
Chloride: 107 mmol/L (ref 98–111)
Creatinine, Ser: 1.06 mg/dL — ABNORMAL HIGH (ref 0.44–1.00)
GFR, Estimated: >60 mL/min (ref >60)
Glucose, Bld: 112 mg/dL — ABNORMAL HIGH (ref 70–99)
Potassium: 3.8 mmol/L (ref 3.5–5.1)
Sodium: 137 mmol/L (ref 135–145)
Total Bilirubin: 1.9 mg/dL — ABNORMAL HIGH (ref 0.3–1.2)
Total Protein: 6.8 g/dL (ref 6.5–8.1)

## 2020-03-28 LAB — CBC
HCT: 29 % — ABNORMAL LOW (ref 36.0–46.0)
Hemoglobin: 8.8 g/dL — ABNORMAL LOW (ref 12.0–15.0)
MCH: 22.6 pg — ABNORMAL LOW (ref 26.0–34.0)
MCHC: 30.3 g/dL (ref 30.0–36.0)
MCV: 74.6 fL — ABNORMAL LOW (ref 80.0–100.0)
Platelets: 408 10*3/uL — ABNORMAL HIGH (ref 150–400)
RBC: 3.89 MIL/uL (ref 3.87–5.11)
WBC: 14 10*3/uL — ABNORMAL HIGH (ref 4.0–10.5)
nRBC: 3.3 % — ABNORMAL HIGH (ref 0.0–0.2)

## 2020-03-28 LAB — URINALYSIS, COMPLETE (UACMP) WITH MICROSCOPIC: Specific Gravity, Urine: 1.013 (ref 1.005–1.030)

## 2020-03-28 NOTE — Discharge Instructions (Addendum)
Your workup in the Emergency Department today was reassuring.  There is not a specific or immediate "fix" for blood in the urine (hematuria), but we encourage you to follow up with Dr. Erlene Quan or one of her colleagues at the Urology clinic.  They may reach out to you, but you can also call the office at the number provided and let them know it was recommended to you by the emergency physician that you follow-up with urology for further evaluation of hematuria.    We recommend you drink plenty of fluids, take your regular medications, and follow up with the doctor(s) listed in these documents as recommended.  Return to the Emergency Department if you develop new or worsening symptoms that concern you.

## 2020-03-28 NOTE — ED Triage Notes (Signed)
Patient states that she came in on Thursday and had a hemoglobin of 3. Patient states that she was admitted and received blood. Patient states that she was discharged on Friday. Patient states that she has had to have transfusions 5 times in the past. Patient states that every time she has a transfusion she starts having blood in her urine. Patient states that she started having dark blood in her urine this morning and that it has now changed to bright red.

## 2020-03-28 NOTE — ED Provider Notes (Signed)
[facility] Emergency Department Provider Note  ____________________________________________   Event Date/Time   First MD Initiated Contact with Patient 03/28/20 2310     (approximate)  I have reviewed the triage vital signs and the nursing notes.   HISTORY  Chief Complaint Hematuria    HPI Dana Dunlap is a 49 y.o. female with a history of extensive fibroids and iron deficient and hemorrhagic anemia who was recently hospitalized for hemoglobin of less than 4 requiring blood transfusions.  She has a follow-up appointment scheduled with Dr. Amalia Hailey with OB/GYN to discuss options to treat the blood loss secondary to uterine fibroids but she has had issues with symptomatic and iron deficient anemia for years and is followed by Dr. Grayland Ormond in the hematology clinic.  She presents tonight for acute onset and severe gross hematuria.  She has no increased urinary frequency and no dysuria.  She has intermittent abdominal cramping but no pain currently although it comes and goes.  She says that she is frightened when she sees the blood in her urine and it has happened 2 times in the past, always after getting a blood transfusion.  She believes that something about getting the blood transfusion causes her to have blood in her urine for about a week until he goes away.  She says that 1 time in the past she went to an urgent care and was given a pill, and after 1 dose the blood went away, but she does not know what the pill is.  She has not had the opportunity to follow-up with urology in the past.  She feels very certain that the blood is not coming from her vagina, but is coming from her urethra.  She said that she tested by placing some tissue on and around the vagina and it did not transfer any blood, and then she urinated and saw bright red blood.  She has had no fever, nausea, vomiting, dysuria, increased urinary frequency, or any other pain except for the abdominal cramping.          Past Medical History:  Diagnosis Date  . Depression    Currently on Cymbalta  . Eye abnormalities   . Fibroids   . Frequent headaches    Worse during monthly menstrual cycle  . Hypertension     Patient Active Problem List   Diagnosis Date Noted  . [place]   . Essential hypertension   . 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 Primary osteoarthritis of left knee 12/04/2018  . Abnormal uterine bleeding (AUB) 11/27/2018  . Fibroids 11/27/2018  . Left knee pain 08/05/2018  . Other neutropenia ([place]) 08/05/2018  . Symptomatic anemia 01/23/2018  . Right knee pain 01/09/2017  . Hyperlipidemia 06/27/2016  . Thrombocytosis 03/22/2016  . Anxiety and depression 03/21/2016  . Excessive and frequent menstruation 08/21/2013  . Fibroid, uterine 07/28/2013  . Iron deficiency anemia due to chronic blood loss 07/28/2013  . Elevated blood pressure reading 03/26/2013    Past Surgical History:  Procedure Laterality Date  . BREAST MASS EXCISION     benign  . KNEE SURGERY      Prior to Admission medications   Medication Sig Start Date End Date Taking? Authorizing Provider  acetaminophen (TYLENOL) 325 MG tablet Take 650 mg by mouth every 6 (six) hours as needed.    [provider]  amLODipine (NORVASC) 5 MG tablet Take 1 tablet (5 mg total) by mouth daily. 03/25/20   Loletha Grayer, MD  DULoxetine (  CYMBALTA) 30 MG capsule Three tablets po daily 03/25/20   Loletha Grayer, MD  ferrous sulfate (FEOSOL) 325 (65 FE) MG tablet Take 1 tablet (325 mg total) by mouth 2 (two) times daily with a meal. 03/25/20   Loletha Grayer, MD  traZODone (DESYREL) 50 MG tablet Take 1 tablet (50 mg total) by mouth at bedtime. 03/25/20   Loletha Grayer, MD    Allergies Prochlorperazine, Compazine [prochlorperazine edisylate], Other, and Tramadol  Family History  Problem Relation Age of Onset  . Hypertension Mother   . Hyperlipidemia Mother   . Cancer Father         Prostate cancer  . Hypertension Father   . Hyperlipidemia Father   . Stroke Maternal Aunt   . Stroke Paternal Aunt   . Diabetes Paternal Aunt   . Sickle cell trait Other   . Thalassemia Other     Social History Social History   Tobacco Use  . Smoking status: Never Smoker  . Smokeless tobacco: Never Used  Vaping Use  . Vaping Use: Never used  Substance Use Topics  . Alcohol use: Yes    Alcohol/week: 1.0 standard drink    Types: 1 Glasses of wine per week  . Drug use: No    Review of Systems Constitutional: No fever/chills Eyes: No visual changes. ENT: No sore throat. Cardiovascular: Denies chest pain. Respiratory: Denies shortness of breath. Gastrointestinal: Intermittent abdominal cramping.  No nausea, no vomiting.  No diarrhea.  No constipation. Genitourinary: Positive for hematuria.  Negative for dysuria. Musculoskeletal: Negative for neck pain.  Negative for back pain. Integumentary: Negative for rash. Neurological: Negative for headaches, focal weakness or numbness.   ____________________________________________   PHYSICAL EXAM:  VITAL SIGNS: ED Triage Vitals  Enc Vitals Group     BP 03/28/20 2031 (!) 153/92     Pulse Rate 03/28/20 2031 65     Resp 03/28/20 2031 18     Temp 03/28/20 2031 98.7 F (37.1 C)     Temp Source 03/28/20 2031 Oral     SpO2 03/28/20 2031 100 %     Weight 03/28/20 2032 72.6 kg (160 lb 0.9 oz)     Height 03/28/20 2032 1.524 m (5')     Head Circumference --      Peak Flow --      Pain Score 03/28/20 2031 9     Pain Loc --      Pain Edu? --      Excl. in [place]? --     Constitutional: Alert and oriented.  Eyes: Conjunctivae are normal.  Head: Atraumatic. Nose: No congestion/rhinnorhea. Mouth/Throat: Patient is wearing a mask. Neck: No stridor.  No meningeal signs.   Cardiovascular: Normal rate, regular rhythm. Good peripheral circulation. Respiratory: Normal respiratory effort.  No retractions. Gastrointestinal: Soft and  nontender. No distention.  Genitourinary: Deferred, patient is followed closely by OB/GYN and was just in the hospital for uterine fibroid bleeding and denies vaginal bleeding and other related complaints at this time. Musculoskeletal: No lower extremity tenderness nor edema. No gross deformities of extremities. Neurologic:  Normal speech and language. No gross focal neurologic deficits are appreciated.  Skin:  Skin is warm, dry and intact. Psychiatric: Mood and affect are anxious but generally appropriate.  ____________________________________________   LABS (all labs ordered are listed, but only abnormal results are displayed)  Labs Reviewed  CBC - Abnormal; Notable for the following components:      Result Value   WBC 14.0 (*)  Hemoglobin 8.8 (*)    HCT 29.0 (*)    MCV 74.6 (*)    MCH 22.6 (*)    Platelets 408 (*)    nRBC 3.3 (*)    All other components within normal limits  COMPREHENSIVE METABOLIC PANEL - Abnormal; Notable for the following components:   Glucose, Bld 112 (*)    Creatinine, Ser 1.06 (*)    Total Bilirubin 1.9 (*)    All other components within normal limits  URINALYSIS, COMPLETE (UACMP) WITH MICROSCOPIC - Abnormal; Notable for the following components:   Color, Urine RED (*)    APPearance CLOUDY (*)    Glucose, UA   (*)    Value: TEST NOT REPORTED DUE TO COLOR INTERFERENCE OF URINE PIGMENT   Hgb urine dipstick   (*)    Value: TEST NOT REPORTED DUE TO COLOR INTERFERENCE OF URINE PIGMENT   Bilirubin Urine   (*)    Value: TEST NOT REPORTED DUE TO COLOR INTERFERENCE OF URINE PIGMENT   Ketones, ur   (*)    Value: TEST NOT REPORTED DUE TO COLOR INTERFERENCE OF URINE PIGMENT   Protein, ur   (*)    Value: TEST NOT REPORTED DUE TO COLOR INTERFERENCE OF URINE PIGMENT   Nitrite   (*)    Value: TEST NOT REPORTED DUE TO COLOR INTERFERENCE OF URINE PIGMENT   Leukocytes,Ua   (*)    Value: TEST NOT REPORTED DUE TO COLOR INTERFERENCE OF URINE PIGMENT   Bacteria, UA  FEW (*)    All other components within normal limits  URINE CULTURE   ____________________________________________  EKG  No indication for emergent EKG ____________________________________________  RADIOLOGY I, Hinda Kehr, personally viewed and evaluated these images (plain radiographs) as part of my medical decision making, as well as reviewing the written report by the radiologist.  ED MD interpretation: No indication for emergent imaging  Official radiology report(s): No results found.  ____________________________________________   PROCEDURES   Procedure(s) performed (including Critical Care):  Procedures   ____________________________________________   INITIAL IMPRESSION / MDM / ASSESSMENT AND PLAN / ED COURSE  As part of my medical decision making, I reviewed the following data within the [place] notes reviewed and incorporated, Labs reviewed , Old chart reviewed and Notes from prior ED visits   Differential diagnosis includes, but is not limited to, hematuria NOS, vaginal/uterine fibroid bleeding that is being mistaken for hematuria, UTI, neoplasm, interstitial cystitis, glomerular nephritis/glomerular nephrosis.  The patient has normal vital signs, afebrile, no tachycardia.  Her hemoglobin has continued to improve after her hospitalization and transfusion and has a hemoglobin of 8.8 now which is good for her.  She has a leukocytosis of 14 which is nonspecific in the absence of any symptoms.  Her urinalysis does not fact demonstrate gross hematuria but has few bacteria, no clumps of WBCs.  Urine culture has been ordered.  Comprehensive metabolic panel is normal.  Overall the exam is reassuring.  She has no clinically significant tenderness to palpation of her abdomen.  She has not been vomiting.  She has no signs or symptoms of UTI, just the hematuria.  This is happened to her twice in the past after transfusions and it is  self-limited.  I reviewed the medical record and saw numerous visits to Dr. Grayland Ormond and other clinics for symptomatic anemia and I saw at least 1 visit in the past that referenced hematuria, but I could not see what medication might have been given  to improve the hematuria in the past.  My suspicion is that she was given empiric antibiotics, perhaps a one-time dose such as fosfomycin, but tonight I will defer because I do not think the her current hematuria is the result of an acute infection that just happened to start immediately after a blood transfusion in the absence of any dysuria no increased urinary frequency nor other sign or symptom of infection.  I think that the urine culture will be beneficial.  I counseled the patient that her results are reassuring and that she likely will benefit from following up with urology who can provide advice about whether or not additional work-up would be indicated, such as a cystoscopy.  The patient understands and will contact the urology clinic, but I will also send a message to Dr. Erlene Quan so she is aware of the patient.  I encouraged her to continue taking her regular medications and I gave my usual and customary return precautions.  The patient understands and agrees with the plan.           ____________________________________________  FINAL CLINICAL IMPRESSION(S) / ED DIAGNOSES  Final diagnoses:  Gross hematuria     MEDICATIONS GIVEN DURING THIS VISIT:  Medications - No data to display   ED Discharge Orders    None      *Please note:  Dana Dunlap was evaluated in Emergency Department on 03/29/2020 for the symptoms described in the history of present illness. She was evaluated in the context of the global COVID-19 pandemic, which necessitated consideration that the patient might be at risk for infection with the SARS-CoV-2 virus that causes COVID-19. Institutional protocols and algorithms that pertain to the evaluation of patients at  risk for COVID-19 are in a state of rapid change based on information released by regulatory bodies including the CDC and federal and state organizations. These policies and algorithms were followed during the patient's care in the ED.  Some ED evaluations and interventions may be delayed as a result of limited staffing during and after the pandemic.*  Note:  This document was prepared using Dragon voice recognition software and may include unintentional dictation errors.   Hinda Kehr, MD 03/29/20 [phone]

## 2020-03-29 LAB — POC URINE PREG, ED: Preg Test, Ur: NEGATIVE

## 2020-03-30 LAB — URINE CULTURE
Culture: >=100000 — AB
Special Requests: NORMAL

## 2020-03-31 ENCOUNTER — Other Ambulatory Visit: Payer: Self-pay | Admitting: Urology

## 2020-03-31 ENCOUNTER — Ambulatory Visit (INDEPENDENT_AMBULATORY_CARE_PROVIDER_SITE_OTHER): Payer: Self-pay | Admitting: Urology

## 2020-03-31 ENCOUNTER — Encounter: Payer: Self-pay | Admitting: Urology

## 2020-03-31 ENCOUNTER — Other Ambulatory Visit: Payer: Self-pay

## 2020-03-31 VITALS — BP 121/75 | HR 92 | Ht 62.0 in | Wt 160.0 lb

## 2020-03-31 DIAGNOSIS — R310 Gross hematuria: Secondary | ICD-10-CM

## 2020-03-31 LAB — URINALYSIS, COMPLETE

## 2020-03-31 LAB — MICROSCOPIC EXAMINATION: RBC, Urine: >30 /hpf — AB (ref 0–2)

## 2020-03-31 LAB — PROTEIN ELECTROPHORESIS, SERUM
A/G Ratio: 1.2 (ref 0.7–1.7)
Albumin ELP: 3.2 g/dL (ref 2.9–4.4)
Alpha-1-Globulin: 0.2 g/dL (ref 0.0–0.4)
Alpha-2-Globulin: 0.5 g/dL (ref 0.4–1.0)
Beta Globulin: 1 g/dL (ref 0.7–1.3)
Gamma Globulin: 0.9 g/dL (ref 0.4–1.8)
Globulin, Total: 2.6 g/dL (ref 2.2–3.9)
Total Protein ELP: 5.8 g/dL — ABNORMAL LOW (ref 6.0–8.5)

## 2020-03-31 MED ORDER — AMOXICILLIN 875 MG PO TABS: 875.0000 mg | ORAL_TABLET | Freq: Two times a day (BID) | ORAL | 0 refills | Status: DC

## 2020-03-31 NOTE — Progress Notes (Signed)
03/31/2020 2:14 PM   Dana Dunlap 04/09/1971 [id number]  Referring provider: No referring provider defined for this encounter.  Chief Complaint  Patient presents with  . Hematuria    HPI: 49 y.o. female presents for ED follow-up of gross hematuria.   Admitted 12/22 with hemoglobin of 3.6 and symptomatic anemia with syncope; long history of severe iron deficiency anemia and menorrhagia secondary to uterine fibroids  She received 3 units packed red blood cells  States after prior blood transfusion she develops gross hematuria which occurred after discharge  Notes some urinary frequency and upper abdominal discomfort  Denies passing clots or clot retention  Urine culture ordered in the ED which was +100,000 colonies group B strep  Does have sickle cell trait   PMH: Past Medical History:  Diagnosis Date  . Depression    Currently on Cymbalta  . Eye abnormalities   . Fibroids   . Frequent headaches    Worse during monthly menstrual cycle  . Hypertension     Surgical History: Past Surgical History:  Procedure Laterality Date  . BREAST MASS EXCISION     benign  . KNEE SURGERY      Home Medications:  Allergies as of 03/31/2020      Reactions   Prochlorperazine Anaphylaxis, Other (See Comments)   Seizure Seizure   Compazine [prochlorperazine Edisylate] Other (See Comments)   Seizure   Other    Sneezing and red eyes   Tramadol Hives      Medication List       Accurate as of March 31, 2020  2:14 PM. If you have any questions, ask your nurse or doctor.        acetaminophen 325 MG tablet Commonly known as: TYLENOL Take 650 mg by mouth every 6 (six) hours as needed.   amLODipine 5 MG tablet Commonly known as: NORVASC Take 1 tablet (5 mg total) by mouth daily.   DULoxetine 30 MG capsule Commonly known as: Cymbalta Three tablets po daily   ferrous sulfate 325 (65 FE) MG tablet Commonly known as: Feosol Take 1 tablet (325 mg total) by mouth 2  (two) times daily with a meal.   traZODone 50 MG tablet Commonly known as: DESYREL Take 1 tablet (50 mg total) by mouth at bedtime.       Allergies:  Allergies  Allergen Reactions  . Prochlorperazine Anaphylaxis and Other (See Comments)    Seizure Seizure  . Compazine [Prochlorperazine Edisylate] Other (See Comments)    Seizure   . Other     Sneezing and red eyes  . Tramadol Hives    Family History: Family History  Problem Relation Age of Onset  . Hypertension Mother   . Hyperlipidemia Mother   . Cancer Father        Prostate cancer  . Hypertension Father   . Hyperlipidemia Father   . Stroke Maternal Aunt   . Stroke Paternal Aunt   . Diabetes Paternal Aunt   . Sickle cell trait Other   . Thalassemia Other     Social History:  reports that she has never smoked. She has never used smokeless tobacco. She reports current alcohol use of about 1.0 standard drink of alcohol per week. She reports that she does not use drugs.   Physical Exam: BP 121/75   Pulse 92   Ht 5\' 2"  (1.575 m)   Wt 160 lb (72.6 kg)   BMI 29.26 kg/m   Constitutional:  Alert and oriented, No acute distress.  HEENT: Dana Dunlap AT, moist mucus membranes.  Trachea midline, no masses. Cardiovascular: No clubbing, cyanosis, or edema. Respiratory: Normal respiratory effort, no increased work of breathing. GI: Abdomen is soft, nontender, nondistended, no abdominal masses GU: No CVA tenderness Lymph: No cervical or inguinal lymphadenopathy. Skin: No rashes, bruises or suspicious lesions. Neurologic: Grossly intact, no focal deficits, moving all 4 extremities. Psychiatric: Normal mood and affect.  Urinalysis Microscopy >30 RBC 11-30 WBC  Assessment & Plan:    1. Gross hematuria  AUA risk stratification: High  We discussed the standard evaluation for high risk hematuria to include CT urogram and cystoscopy; will order an expedited CT urogram  Based on the degree for hematuria would consider cystoscopy  under sedation as office cystoscopy visualization may be suboptimal patient with a history of sickle cell trait she may need upper tract endoscopy    Dana Sons, MD  [facility] [address], [place], [place] [address] [phone]

## 2020-03-31 NOTE — Progress Notes (Signed)
ED Antimicrobial Stewardship Positive Culture Follow Up   Dana Dunlap is an 49 y.o. female who presented to [facility] on 03/28/2020 with a chief complaint of  Chief Complaint  Patient presents with   Hematuria    Recent Results (from the past 720 hour(s))  Resp Panel by RT-PCR (Flu A&B, Covid) Nasopharyngeal Swab     Status: None   Collection Time: 03/24/20  3:13 PM   Specimen: Nasopharyngeal Swab; Nasopharyngeal(NP) swabs in vial transport medium  Result Value Ref Range Status   SARS Coronavirus 2 by RT PCR NEGATIVE NEGATIVE Final    Comment: (NOTE) SARS-CoV-2 target nucleic acids are NOT DETECTED.  The SARS-CoV-2 RNA is generally detectable in upper respiratory specimens during the acute phase of infection. The lowest concentration of SARS-CoV-2 viral copies this assay can detect is 138 copies/mL. A negative result does not preclude SARS-Cov-2 infection and should not be used as the sole basis for treatment or other patient management decisions. A negative result may occur with  improper specimen collection/handling, submission of specimen other than nasopharyngeal swab, presence of viral mutation(s) within the areas targeted by this assay, and inadequate number of viral copies(<138 copies/mL). A negative result must be combined with clinical observations, patient history, and epidemiological information. The expected result is Negative.  Fact Sheet for Patients:  EntrepreneurPulse.com.au  Fact Sheet for Healthcare Providers:  IncredibleEmployment.be  This test is no t yet approved or cleared by the [place] FDA and  has been authorized for detection and/or diagnosis of SARS-CoV-2 by FDA under an Emergency Use Authorization (EUA). This EUA will remain  in effect (meaning this test can be used) for the duration of the COVID-19 declaration under Section 564(b)(1) of the Act, 21 U.S.C.section 360bbb-3(b)(1), unless the authorization  is terminated  or revoked sooner.       Influenza A by PCR NEGATIVE NEGATIVE Final   Influenza B by PCR NEGATIVE NEGATIVE Final    Comment: (NOTE) The Xpert Xpress SARS-CoV-2/FLU/RSV plus assay is intended as an aid in the diagnosis of influenza from Nasopharyngeal swab specimens and should not be used as a sole basis for treatment. Nasal washings and aspirates are unacceptable for Xpert Xpress SARS-CoV-2/FLU/RSV testing.  Fact Sheet for Patients: EntrepreneurPulse.com.au  Fact Sheet for Healthcare Providers: IncredibleEmployment.be  This test is not yet approved or cleared by the [place] FDA and has been authorized for detection and/or diagnosis of SARS-CoV-2 by FDA under an Emergency Use Authorization (EUA). This EUA will remain in effect (meaning this test can be used) for the duration of the COVID-19 declaration under Section 564(b)(1) of the Act, 21 U.S.C. section 360bbb-3(b)(1), unless the authorization is terminated or revoked.  Performed at [facility], [address] [place], [place] [address]   Urine Culture     Status: Abnormal   Collection Time: 03/28/20  9:07 PM   Specimen: Urine, Random  Result Value Ref Range Status   Specimen Description   Final    URINE, RANDOM Performed at [facility], [address] [place], [place] [address]    Special Requests   Final    Normal Performed at [facility], [address] [place], [place] [address]    Culture (A)  Final    >=100,000 COLONIES/mL GROUP B STREP(S.AGALACTIAE)ISOLATED TESTING AGAINST S. AGALACTIAE NOT ROUTINELY PERFORMED DUE TO PREDICTABILITY OF AMP/PEN/VAN SUSCEPTIBILITY. Performed at [facility], [place] [address], [place], [place] [address]    Report Status 03/30/2020 FINAL  Final    [  x] Patient discharged originally without antimicrobial agent and treatment is not indicated.  No symptoms of UTI and UA negative for WBC.  Source of hematuria uncertain with recent vaginal bleeding d/t fibroid  New antibiotic prescription: N/A  ED Provider: Dr Lenard Lance   Juliette Alcide, PharmD, BCPS.   Work Cell: [phone] 03/31/2020 11:59 AM

## 2020-04-01 ENCOUNTER — Ambulatory Visit
Admission: RE | Admit: 2020-04-01 | Discharge: 2020-04-01 | Disposition: A | Discharge status: Home / Self Care | Payer: Self-pay | Source: Ambulatory Visit | Attending: Urology | Admitting: Urology

## 2020-04-01 DIAGNOSIS — R310 Gross hematuria: Secondary | ICD-10-CM | POA: Insufficient documentation

## 2020-04-01 MED ORDER — IOHEXOL 300 MG/ML  SOLN
150.0000 mL | Freq: Once | INTRAMUSCULAR | Status: AC | PRN
  Administered 2020-04-01: 125 mL via INTRAVENOUS

## 2020-04-07 ENCOUNTER — Telehealth: Payer: Self-pay | Admitting: *Deleted

## 2020-04-07 NOTE — Telephone Encounter (Signed)
-----   Message from Riki Altes, MD sent at 04/07/2020  3:27 AM EST ----- No abnormality seen on CT that would explain her bleeding.  If she is still having hematuria would recommend scheduling cystoscopy under anesthesia.  If her bleeding has resolved would recommend office cystoscopy.

## 2020-04-07 NOTE — Telephone Encounter (Signed)
Notified patient as instructed, patient pleased. Discussed follow-up appointments, patient agrees  

## 2020-04-13 ENCOUNTER — Ambulatory Visit: Payer: Medicaid Other | Admitting: Gerontology

## 2020-04-13 ENCOUNTER — Encounter: Payer: Self-pay | Admitting: Gerontology

## 2020-04-13 ENCOUNTER — Other Ambulatory Visit: Payer: Self-pay

## 2020-04-13 ENCOUNTER — Other Ambulatory Visit: Payer: Self-pay | Admitting: Internal Medicine

## 2020-04-13 VITALS — BP 120/70 | HR 75 | Resp 16 | Wt 170.1 lb

## 2020-04-13 DIAGNOSIS — F32A Depression, unspecified: Secondary | ICD-10-CM

## 2020-04-13 DIAGNOSIS — D500 Iron deficiency anemia secondary to blood loss (chronic): Secondary | ICD-10-CM

## 2020-04-13 DIAGNOSIS — M1712 Unilateral primary osteoarthritis, left knee: Secondary | ICD-10-CM

## 2020-04-13 DIAGNOSIS — D259 Leiomyoma of uterus, unspecified: Secondary | ICD-10-CM

## 2020-04-13 DIAGNOSIS — Z7689 Persons encountering health services in other specified circumstances: Secondary | ICD-10-CM

## 2020-04-13 DIAGNOSIS — D75839 Thrombocytosis, unspecified: Secondary | ICD-10-CM

## 2020-04-13 DIAGNOSIS — F419 Anxiety disorder, unspecified: Secondary | ICD-10-CM

## 2020-04-13 MED ORDER — FERROUS SULFATE 325 (65 FE) MG PO TABS: 325.0000 mg | ORAL_TABLET | Freq: Every day | ORAL | 1 refills | Status: DC

## 2020-04-13 MED ORDER — AMLODIPINE BESYLATE 5 MG PO TABS: 5.0000 mg | ORAL_TABLET | Freq: Every day | ORAL | 1 refills | Status: DC

## 2020-04-13 MED ORDER — FLUTICASONE PROPIONATE 50 MCG/ACT NA SUSP: 1.0000 | Freq: Every day | NASAL | 2 refills | Status: DC

## 2020-04-13 MED ORDER — MULTIVITAMINS PO CAPS: 1.0000 | ORAL_CAPSULE | Freq: Every day | ORAL | 1 refills | Status: DC

## 2020-04-13 NOTE — Patient Instructions (Signed)
You need follow up with Hematology for further IV iron as needed.  You need Lab work to check your work Count.  You need to follow up with Gynecologist to have evaluation and treatment for uterine fibroids.   Iron Deficiency Anemia, Adult Iron deficiency anemia is when you do not have enough red blood cells or hemoglobin in your blood. This happens because you have too little iron in your body. Hemoglobin carries oxygen to parts of the body. Anemia can cause your body to not get enough oxygen. What are the causes?  Not eating enough foods that have iron in them.  The body not being able to take in iron well.  Needing more iron due to pregnancy or heavy menstrual periods, for females.  Cancer.  Bleeding in the bowels.  Many blood draws. What increases the risk?  Being pregnant.  Being a teenage girl going through a growth spurt. What are the signs or symptoms?  Pale skin, lips, and nails.  Weakness, dizziness, and getting tired easily.  Headache.  Feeling like you cannot breathe well when moving (shortness of breath).  Cold hands and feet.  Fast heartbeat or a heartbeat that is not regular.  Feeling grouchy (irritable) or breathing fast. These are more common in very bad anemia. Mild anemia may not cause any symptoms. How is this treated? This condition is treated by finding out why you do not have enough iron and then getting more iron. It may include:  Adding foods to your diet that have a lot of iron.  Taking iron pills (supplements). If you are pregnant or breastfeeding, you may need to take extra iron. Your diet often does not provide the amount of iron that you need.  Getting more vitamin C in your diet. Vitamin C helps your body take in iron. You may need to take iron pills with a glass of orange juice or vitamin C pills.  Medicines to make heavy menstrual periods lighter.  Surgery. You may need blood tests to see if treatment is working. If the treatment  does not seem to be working, you may need more tests. Follow these instructions at home: Medicines  Take over-the-counter and prescription medicines only as told by your doctor. This includes iron pills and vitamins. ? Take iron pills when your stomach is empty. If you cannot handle this, take them with food. ? Do not drink milk or take antacids at the same time as your iron pills. ? Iron pills may turn your poop (stool)black.  If you cannot handle taking iron pills by mouth, ask your doctor about getting iron through: ? An IV tube. ? A shot (injection) into a muscle. Eating and drinking  Talk with your doctor before changing the foods you eat. He or she may tell you to eat foods that have a lot of iron, such as: ? Liver. ? Low-fat (lean) beef. ? Breads and cereals that have iron added to them. ? Eggs. ? Dried fruit. ? Dark green, leafy vegetables.  Eat fresh fruits and vegetables that are high in vitamin C. They help your body use iron. Foods with a lot of vitamin C include: ? Oranges. ? Peppers. ? Tomatoes. ? Mangoes.  Drink enough fluid to keep your pee (urine) pale yellow.   Managing constipation If you are taking iron pills, they may cause trouble pooping (constipation). To prevent or treat trouble pooping, you may need to:  Take over-the-counter or prescription medicines.  Eat foods that are high in  fiber. These include beans, whole grains, and fresh fruits and vegetables.  Limit foods that are high in fat and sugar. These include fried or sweet foods. General instructions  Return to your normal activities as told by your doctor. Ask your doctor what activities are safe for you.  Keep yourself clean, and keep things clean around you.  Keep all follow-up visits as told by your doctor. This is important. Contact a doctor if:  You feel like you may vomit (nauseous), or you vomit.  You feel weak.  You are sweating for no reason.  You have trouble pooping, such  as: ? Pooping less than 3 times a week. ? Straining to poop. ? Having poop that is hard, dry, or larger than normal. ? Feeling full or bloated. ? Pain in the lower belly. ? Not feeling better after pooping. Get help right away if:  You pass out (faint).  You have chest pain.  You have trouble breathing that: ? Is very bad. ? Gets worse with physical activity.  You have a fast heartbeat, or a heartbeat that does not feel regular.  You get light-headed when getting up from sitting or lying down. These symptoms may be an emergency. Do not wait to see if the symptoms will go away. Get medical help right away. Call your local emergency services (911 in the [place] Do not drive yourself to the hospital. Summary  Iron deficiency anemia is when you have too little iron in your body.  This condition is treated by finding out why you do not have enough iron in your body and then getting more iron.  Take over-the-counter and prescription medicines only as told by your doctor.  Eat fresh fruits and vegetables that are high in vitamin C.  Get help right away if you cannot breathe well. This information is not intended to replace advice given to you by your health care provider. Make sure you discuss any questions you have with your health care provider. Document Revised: 11/26/2018 Document Reviewed: 11/26/2018 Elsevier Patient Education  [place].

## 2020-04-13 NOTE — Progress Notes (Signed)
New Patient Office Visit  Subjective:  Patient ID: Dana Dunlap, female    DOB: 04/09/1971  Age: 50 y.o. MRN: [id number]  CC:  Chief Complaint  Patient presents with   Hospitalization Follow-up    HPI Dana Dunlap presents for hospital follow up, Patient  PMH significant for  uterine fibroid, iron deficiency anemia, Family history of sickle cell, she was hospitalized from 03-24-2020 until 03-25-2020 for severe anemia and syncope episode. She was found to have hb of 3. She received 3 units of PRBC and IV iron infusion. Her Hb at discharge was 8.8. Her iron deficiency anemia was thought to be secondary to Menorrhagia from uterine fibroids. She also develops gross hematuria during hospitalization. He urine culture grew strep Agalactiae. She was treated for UTI. She report resolution of Hematuria. She underwent CT urogram which was negative. She has appointment with urology on Thursday to discussed Cystoscopy.   She report improvement of SOB, she has significantly improve in that regard. She is still having menorrhagia.  She report sinus congestions, headaches and fogginess.  She is interested in multivitamins.      Past Medical History:  Diagnosis Date   Depression    Currently on Cymbalta   Eye abnormalities    Fibroids    Frequent headaches    Worse during monthly menstrual cycle   Hypertension     Past Surgical History:  Procedure Laterality Date   BREAST MASS EXCISION     benign   KNEE SURGERY      Family History  Problem Relation Age of Onset   Hypertension Mother    Hyperlipidemia Mother    Cancer Father        Prostate cancer   Hypertension Father    Hyperlipidemia Father    Stroke Maternal Aunt    Stroke Paternal Aunt    Diabetes Paternal Aunt    Sickle cell trait Other    Thalassemia Other     Social History   Socioeconomic History   Marital status: Single    Spouse name: Not on file   Number of children: 0   Years of  education: 16   Highest education level: Bachelor's degree (e.g., BA, AB, BS)  Occupational History   Occupation: Psychologist, clinical: [employer]    Comment: Molecular biology  Tobacco Use   Smoking status: Never Smoker   Smokeless tobacco: Never Used  Vaping Use   Vaping Use: Never used  Substance and Sexual Activity   Alcohol use: Yes    Alcohol/week: 1.0 standard drink    Types: 1 Glasses of wine per week   Drug use: No   Sexual activity: Yes    Birth control/protection: Condom  Other Topics Concern   Not on file  Social History Narrative   Tonishia is originally from [place]. She attended [employer] in [place] where she obtained her Therapist, nutritional in Biology in 1995. 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 She is in a long term relationship with her boyfried, Dana Dunlap. They have been together for 6 years. [name] transferred to [facility] with [employer] in January. She enjoys reading and she enjoys the outdoors. She loves to travel. 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   Social Determinants of Health   Financial Resource Strain: Not on file  Food Insecurity: Not on file  Transportation Needs: Not on file  Physical Activity:  Insufficiently Active   Days of Exercise per Week: 2 days   Minutes of Exercise per Session: 30 min  Stress: Not on file  Social Connections: Not on file  Intimate Partner Violence: Not on file    ROS Review of Systems  Constitutional: Positive for fatigue.  HENT: Positive for congestion, rhinorrhea and sinus pressure.   Respiratory: Negative.   Cardiovascular: Negative.   Gastrointestinal: Positive for constipation.  Skin: Negative.     Objective:   Today's Vitals: BP 120/70    Pulse 75    Resp 16    Wt 170 lb 1.6 oz (77.2 kg)    LMP 03/29/2020    SpO2 99%    BMI 31.11 kg/m   Physical Exam HENT:     Head: Atraumatic.   Cardiovascular:     Rate and Rhythm: Normal rate and regular rhythm.     Pulses: Normal pulses.  Pulmonary:     Effort: Pulmonary effort is normal.     Breath sounds: Normal breath sounds.  Abdominal:     General: Bowel sounds are normal.     Palpations: Abdomen is soft.  Neurological:     Mental Status: She is alert.     Assessment & Plan:   1-Iron deficiency Anemia;  -Advised to continue to take ferrous sulfate daily.  -Repeat CBC to follow hb level.  -Referral to hematologist for possible IV iron and further evaluation of thrombocytosis and other cause of anemia.  -  2-Uterine Fibroids;  -Referral to GYN made today.  -repeat CBC.   3-HTN;  -Continue with Norvasc.   4-Headaches, sinus pressure; She report headaches since she was hospitalized. It has been getting better. Now only 4/10 previously 10/10 -continue with tylenol.  Flonase for sinusitis.   5-Hematuria;  Resolved.  Treated for UTI.  She will need repeat UA, defer to urology.. She had CT urogram which was negative.  She has appointment with urology to discussed Cystoscopy.     Problem List Items Addressed This Visit    Fibroid, uterine   Iron deficiency anemia due to chronic blood loss   Relevant Medications   ferrous sulfate (FEOSOL) 325 (65 FE) MG tablet   Other Relevant Orders   CBC w/Diff   Vitamin D (25 hydroxy)   Ambulatory referral to Hematology   Anxiety and depression   Thrombocytosis   Primary osteoarthritis of left knee - Primary   Encounter to establish care   Relevant Orders   Ambulatory referral to Obstetrics / Gynecology      Outpatient Encounter Medications as of 04/13/2020  Medication Sig   fluticasone (FLONASE) 50 MCG/ACT nasal spray Place 1 spray into both nostrils daily.   Multiple Vitamin (MULTIVITAMIN) capsule Take 1 capsule by mouth daily.   acetaminophen (TYLENOL) 325 MG tablet Take 650 mg by mouth every 6 (six) hours as needed.   amLODipine (NORVASC) 5 MG tablet  Take 1 tablet (5 mg total) by mouth daily.   amoxicillin (AMOXIL) 875 MG tablet Take 1 tablet (875 mg total) by mouth every 12 (twelve) hours.   DULoxetine (CYMBALTA) 30 MG capsule Three tablets po daily   ferrous sulfate (FEOSOL) 325 (65 FE) MG tablet Take 1 tablet (325 mg total) by mouth daily with breakfast.   traZODone (DESYREL) 50 MG tablet Take 1 tablet (50 mg total) by mouth at bedtime.   [DISCONTINUED] amLODipine (NORVASC) 5 MG tablet Take 1 tablet (5 mg total) by mouth daily.   [DISCONTINUED] ferrous sulfate (FEOSOL) 325 (65  FE) MG tablet Take 1 tablet (325 mg total) by mouth 2 (two) times daily with a meal.   No facility-administered encounter medications on file as of 04/13/2020.    Follow-up: Return in about 4 weeks (around 05/11/2020), or if symptoms worsen or fail to improve.   Elmarie Shiley, MD

## 2020-04-15 ENCOUNTER — Other Ambulatory Visit: Payer: Self-pay

## 2020-04-15 ENCOUNTER — Other Ambulatory Visit: Payer: Self-pay | Admitting: Urology

## 2020-04-15 ENCOUNTER — Ambulatory Visit (INDEPENDENT_AMBULATORY_CARE_PROVIDER_SITE_OTHER): Payer: Medicaid Other | Admitting: Urology

## 2020-04-15 ENCOUNTER — Encounter: Payer: Self-pay | Admitting: Urology

## 2020-04-15 VITALS — BP 145/85 | HR 84 | Ht 62.0 in | Wt 162.0 lb

## 2020-04-15 DIAGNOSIS — R310 Gross hematuria: Secondary | ICD-10-CM

## 2020-04-15 NOTE — Progress Notes (Signed)
   04/15/20  CC:  Chief Complaint  Patient presents with  . Cysto   Indication: High risk hematuria  HPI: Refer to my previous note 03/31/2020.  States hematuria resolved 2-3 days after her visit.  CTU showed a 5 mm hypodensity in the upper pole most likely a small cyst.  Incidentally noted to have a 6 mm enhancing lesion in the left hepatic lobe 8 mm low-attenuation structure in the right lobe of the liver felt too small to characterize.  UA today 3-10 RBC  Blood pressure (!) 145/85, pulse 84, height 5\' 2"  (1.575 m), weight 162 lb (73.5 kg), last menstrual period 03/29/2020. NED. A&Ox3.   No respiratory distress   Abd soft, NT, ND Normal external genitalia with patent urethral meatus  Cystoscopy Procedure Note  Patient identification was confirmed, informed consent was obtained, and patient was prepped using Betadine solution.  Lidocaine jelly was administered per urethral meatus.    Procedure: - Flexible cystoscope introduced, without any difficulty.   - Thorough search of the bladder revealed:    normal urethral meatus    normal urothelium    no stones    no ulcers     no tumors    no urethral polyps    no trabeculation    extrinsic compression posterior wall by large fibroid  - Ureteral orifices were normal in position and appearance.  Post-Procedure: - Patient tolerated the procedure well  Assessment/ Plan:  No mucosal abnormalities noted on cystoscopy  Gross hematuria questionably related to sickle trait; will message Dr. Grayland Ormond regarding any relationship between gross hematuria and sickle trait related to a blood transfusion  Urine cytology sent  Small enhancing liver lesion and will message GI regarding further evaluation  Instructed to call for recurrent gross hematuria and will otherwise follow-up in 3 months   Abbie Sons, MD

## 2020-04-16 LAB — URINALYSIS, COMPLETE
Bilirubin, UA: NEGATIVE
Glucose, UA: NEGATIVE
Ketones, UA: NEGATIVE
Leukocytes,UA: NEGATIVE
Nitrite, UA: NEGATIVE
Protein,UA: NEGATIVE
Specific Gravity, UA: <1.005 — ABNORMAL LOW (ref 1.005–1.030)
Urobilinogen, Ur: 0.2 mg/dL (ref 0.2–1.0)
pH, UA: 6.5 (ref 5.0–7.5)

## 2020-04-16 LAB — MICROSCOPIC EXAMINATION: Bacteria, UA: NONE SEEN

## 2020-04-20 LAB — CYTOLOGY - NON PAP

## 2020-04-21 ENCOUNTER — Other Ambulatory Visit: Payer: Medicaid Other

## 2020-04-22 ENCOUNTER — Other Ambulatory Visit: Payer: Self-pay | Admitting: Urology

## 2020-04-22 DIAGNOSIS — R932 Abnormal findings on diagnostic imaging of liver and biliary tract: Secondary | ICD-10-CM

## 2020-04-23 ENCOUNTER — Telehealth: Payer: Self-pay | Admitting: *Deleted

## 2020-04-23 NOTE — Telephone Encounter (Signed)
-----   Message from Abbie Sons, MD sent at 04/22/2020  4:27 PM EST ----- Please let patient know her urine cytology showed no abnormal cells.  I did discuss with GI her liver findings on CT and they will be calling her for an appointment to evaluate this further.

## 2020-04-23 NOTE — Telephone Encounter (Signed)
Notified patient as instructed, patient pleased °

## 2020-04-27 ENCOUNTER — Ambulatory Visit: Payer: Self-pay | Admitting: Licensed Clinical Social Worker

## 2020-04-27 ENCOUNTER — Institutional Professional Consult (permissible substitution): Payer: Self-pay | Admitting: Licensed Clinical Social Worker

## 2020-04-27 ENCOUNTER — Other Ambulatory Visit: Payer: Self-pay

## 2020-04-27 DIAGNOSIS — Z7689 Persons encountering health services in other specified circumstances: Secondary | ICD-10-CM
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
## 2020-05-05 ENCOUNTER — Ambulatory Visit: Payer: Self-pay | Admitting: Licensed Clinical Social Worker

## 2020-05-05 ENCOUNTER — Other Ambulatory Visit: Payer: Self-pay | Admitting: Gerontology

## 2020-05-05 ENCOUNTER — Other Ambulatory Visit: Payer: Self-pay

## 2020-05-05 DIAGNOSIS — F32A Depression, unspecified: Secondary | ICD-10-CM

## 2020-05-05 DIAGNOSIS — F419 Anxiety disorder, unspecified: Secondary | ICD-10-CM

## 2020-05-05 DIAGNOSIS — D500 Iron deficiency anemia secondary to blood loss (chronic): Secondary | ICD-10-CM

## 2020-05-05 MED ORDER — DULOXETINE HCL 30 MG PO CPEP: ORAL_CAPSULE | ORAL | 2 refills | Status: DC

## 2020-05-05 MED ORDER — TRAZODONE HCL 50 MG PO TABS: 100.0000 mg | ORAL_TABLET | Freq: Every day | ORAL | 0 refills | Status: DC

## 2020-05-05 NOTE — BH Specialist Note (Signed)
Integrated Behavioral Health Follow Up In-Person Visit  MRN: [id number] Name: Dana Dunlap   Total time: 60 minutes  Types of Service: [place] (BHI)  Interpretor:No. Interpretor Name and Language:   Subjective: Dana Dunlap is a 50 y.o. female accompanied by herself Patient was referred by Carlyon Shadow, NP  for mental health. Patient reports the following symptoms/concerns: The patient reports that she is really sad about her health and the lack of information she has been given from various providers. She stated, " my anxiety has been worse since we last spoke." She reported that  a physician told her she was ineligible for a procedure to treat her fibroids because they were to large. She stated that she called the nurse line and was asked what size her tumors; which shocked her that she didn't know and she began to question why she had never been told this information. The patient stated that she was told ablation would be possible by one person then another told her not to do it because research indicated potentially painful side effects. The patient discussed her concerns regarding the ethical and legal ability for physicians to preform procedures that could cause harmful side effect. The patient discussed several health stressors and expressed concerns for the state of the world today. She noted that she has been seeing the world's mental health decline and can not understand how some people become suicidal and other's do not. She stated that she does not have suicidal or homicidal thoughts but worries that she may develop them.  Duration of problem: ; Severity of problem: moderate  Objective: Mood: Anxious and Affect: Labile Risk of harm to self or others: No plan to harm self or others  Life Context: Family and Social: see above School/Work: see above Self-Care: see above Life Changes: see above  Patient and/or Family's Strengths/Protective  Factors: Concrete supports in place (healthy food, safe environments, etc.)  Goals Addressed: Patient will: 1.  Reduce symptoms of: anxiety, depression and mood instability  2.  Increase knowledge and/or ability of: coping skills, healthy habits and self-management skills  3.  Demonstrate ability to: Increase healthy adjustment to current life circumstances  Progress towards Goals: Ongoing  Interventions: Interventions utilized:  CBT Cognitive Behavioral Therapy was utilized by the clinician during today's follow up session. The clinician processed with the patient how they have been doing since the last follow-up session. The clinician provided a space for the patient to ventilate their frustrations regarding their current life circumstances. Clinician measured the patient's anxiety and depression on a numerical scale. Clinician encouraged the patient to journal her concerns and questions so she can share them with her physician. Clinician  Reviewed the IBH case consultation recommendations with the patient and reviewed her options for mental health care. Clinician offered to answer any question the patient may have.  Standardized Assessments completed: GAD-7 and PHQ 9  PHQ-9    20 GAD-7    18   Assessment: Patient currently experiencing see above  Patient may benefit from see above.  Plan: 1. Follow up with behavioral health clinician on : 05/19/2020 at 2:00 PM  2. Behavioral recommendations:  3. Referral(s): [place] (In Clinic) 4. "From scale of 1-10, how likely are you to follow plan?":   Lesli Albee, Student-Social Work

## 2020-05-06 LAB — CBC WITH DIFFERENTIAL/PLATELET
Basophils Absolute: 0 10*3/uL (ref 0.0–0.2)
Basos: 1 %
EOS (ABSOLUTE): 0.1 10*3/uL (ref 0.0–0.4)
Eos: 4 %
Hematocrit: 24.9 % — ABNORMAL LOW (ref 34.0–46.6)
Hemoglobin: 6.8 g/dL — CL (ref 11.1–15.9)
Immature Grans (Abs): 0 10*3/uL (ref 0.0–0.1)
Immature Granulocytes: 0 %
Lymphocytes Absolute: 0.9 10*3/uL (ref 0.7–3.1)
Lymphs: 36 %
MCH: 20.4 pg — ABNORMAL LOW (ref 26.6–33.0)
MCHC: 27.3 g/dL — ABNORMAL LOW (ref 31.5–35.7)
MCV: 75 fL — ABNORMAL LOW (ref 79–97)
Monocytes Absolute: 0.2 10*3/uL (ref 0.1–0.9)
Monocytes: 8 %
Neutrophils Absolute: 1.3 10*3/uL — ABNORMAL LOW (ref 1.4–7.0)
Neutrophils: 51 %
Platelets: 415 10*3/uL (ref 150–450)
RBC: 3.34 x10E6/uL — ABNORMAL LOW (ref 3.77–5.28)
RDW: 23.2 % — ABNORMAL HIGH (ref 11.7–15.4)
WBC: 2.6 10*3/uL — ABNORMAL LOW (ref 3.4–10.8)

## 2020-05-06 LAB — VITAMIN D 25 HYDROXY (VIT D DEFICIENCY, FRACTURES): Vit D, 25-Hydroxy: 10.4 ng/mL — ABNORMAL LOW (ref 30.0–100.0)

## 2020-05-07 ENCOUNTER — Inpatient Hospital Stay: Payer: Self-pay | Attending: Oncology

## 2020-05-07 ENCOUNTER — Inpatient Hospital Stay (HOSPITAL_BASED_OUTPATIENT_CLINIC_OR_DEPARTMENT_OTHER): Payer: Self-pay | Admitting: Oncology

## 2020-05-07 ENCOUNTER — Encounter: Payer: Self-pay | Admitting: Oncology

## 2020-05-07 VITALS — BP 145/80 | HR 81 | Temp 97.5°F | Resp 20 | Wt 167.8 lb

## 2020-05-07 DIAGNOSIS — D509 Iron deficiency anemia, unspecified: Secondary | ICD-10-CM

## 2020-05-07 DIAGNOSIS — D75839 Thrombocytosis, unspecified: Secondary | ICD-10-CM | POA: Insufficient documentation

## 2020-05-07 NOTE — Progress Notes (Signed)
Hematology/Oncology Consult note [facility]  [phone] [phone]  Patient Care Team: Pcp, No as PCP - General Elmarie Shiley, MD as Consulting Physician (Internal Medicine)   Name of the patient: Dana Dunlap  [id number]  04/09/1971   Date of visit: 05/07/20  Diagnosis-history of iron deficiency anemia  Chief complaint/ Reason for visit-routine follow-up of iron deficiency anemia  Heme/Onc history: Patient is a 50 year old African-American female who was seen by Dr. Grayland Ormond for iron deficiency anemia and has required IV iron in the past.  She is transferring her care to me.  Etiology of iron deficiency anemia has been related to be heavy menstrual bleeding secondary to fibroids.  Patient also has history of thrombocytosis which has been attributed to iron deficiency.  Patient has concerns that she has sickle cell disease but on hemoglobinopathy evaluation was normal in 2018.  Interval history-patient is still trying to explore nonsurgical options for her fibroids.  She continues to have heavy menstrual bleeding.  She feels fatigued and at times dizzy.  ECOG PS- 1 Pain scale- 0   Review of systems- Review of Systems  Constitutional: Negative for chills, fever, malaise/fatigue and weight loss.  HENT: Negative for congestion, ear discharge and nosebleeds.   Eyes: Negative for blurred vision.  Respiratory: Negative for cough, hemoptysis, sputum production, shortness of breath and wheezing.   Cardiovascular: Negative for chest pain, palpitations, orthopnea and claudication.  Gastrointestinal: Negative for abdominal pain, blood in stool, constipation, diarrhea, heartburn, melena, nausea and vomiting.  Genitourinary: Negative for dysuria, flank pain, frequency, hematuria and urgency.  Musculoskeletal: Negative for back pain, joint pain and myalgias.  Skin: Negative for rash.  Neurological: Negative for dizziness, tingling, focal  weakness, seizures, weakness and headaches.  Endo/Heme/Allergies: Does not bruise/bleed easily.  Psychiatric/Behavioral: Negative for depression and suicidal ideas. The patient does not have insomnia.       Allergies  Allergen Reactions  . Prochlorperazine Anaphylaxis and Other (See Comments)    Seizure Seizure  . Compazine [Prochlorperazine Edisylate] Other (See Comments)    Seizure   . Other     Sneezing and red eyes  . Tramadol Hives     Past Medical History:  Diagnosis Date  . Depression    Currently on Cymbalta  . Eye abnormalities   . Fibroids   . Frequent headaches    Worse during monthly menstrual cycle  . Hypertension      Past Surgical History:  Procedure Laterality Date  . BREAST MASS EXCISION     benign  . KNEE SURGERY      Social History   Socioeconomic History  . Marital status: Single    Spouse name: Not on file  . Number of children: 0  . Years of education: 16  . Highest education level: Bachelor's degree (e.g., BA, AB, BS)  Occupational History  . 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 Smoking status: Never Smoker  . Smokeless tobacco: Never Used  Vaping Use  . Vaping Use: Never used  Substance and Sexual Activity  . Alcohol use: Yes    Alcohol/week: 1.0 standard drink    Types: 1 Glasses of wine per week  . Drug use: No  . Sexual activity: Yes    Birth control/protection: Condom  Other Topics Concern  . Not on file  Social History Narrative   Kathya is originally from [place]. She attended [employer] in [place] where she obtained her Therapist, nutritional in Biology in 1995. 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 She is in a long term relationship with her boyfried, [name]. They have been together for 6 years. [name] transferred to [employer] with [employer] in January. She enjoys reading and she enjoys  the outdoors. She loves to travel. 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   Social Determinants of Health   Financial Resource Strain: Not on file  Food Insecurity: Not on file  Transportation Needs: Not on file  Physical Activity: Insufficiently Active  . Days of Exercise per Week: 2 days  . Minutes of Exercise per Session: 30 min  Stress: Not on file  Social Connections: Not on file  Intimate Partner Violence: Not on file    Family History  Problem Relation Age of Onset  . Hypertension Mother   . Hyperlipidemia Mother   . Cancer Father        Prostate cancer  . Hypertension Father   . Hyperlipidemia Father   . Stroke Maternal Aunt   . Stroke Paternal Aunt   . Diabetes Paternal Aunt   . Sickle cell trait Other   . Thalassemia Other      Current Outpatient Medications:  .  acetaminophen (TYLENOL) 325 MG tablet, Take 650 mg by mouth every 6 (six) hours as needed., Disp: , Rfl:  .  amLODipine (NORVASC) 5 MG tablet, Take 1 tablet (5 mg total) by mouth daily., Disp: 30 tablet, Rfl: 1 .  amoxicillin (AMOXIL) 875 MG tablet, Take 1 tablet (875 mg total) by mouth every 12 (twelve) hours., Disp: 14 tablet, Rfl: 0 .  DULoxetine (CYMBALTA) 30 MG capsule, Three tablets po daily, Disp: 90 capsule, Rfl: 2 .  ferrous sulfate (FEOSOL) 325 (65 FE) MG tablet, Take 1 tablet (325 mg total) by mouth daily with breakfast., Disp: 60 tablet, Rfl: 1 .  fluticasone (FLONASE) 50 MCG/ACT nasal spray, Place 1 spray into both nostrils daily., Disp: 1 g, Rfl: 2 .  Multiple Vitamin (MULTIVITAMIN) capsule, Take 1 capsule by mouth daily., Disp: 30 capsule, Rfl: 1 .  traZODone (DESYREL) 50 MG tablet, Take 2 tablets (100 mg total) by mouth at bedtime., Disp: 60 tablet, Rfl: 0  Physical exam:  Vitals:   05/07/20 1355  BP: (!) 145/80  Pulse: 81  Resp: 20  Temp: (!) 97.5 F (36.4 C)  TempSrc: Tympanic  SpO2: 100%  Weight: 167 lb 12.8 oz (76.1 kg)   Physical Exam HENT:     Head:  Normocephalic and atraumatic.  Eyes:     Extraocular Movements: EOM normal.     Pupils: Pupils are equal, round, and reactive to light.  Cardiovascular:     Rate and Rhythm: Normal rate and regular rhythm.     Heart sounds: Normal heart sounds.  Pulmonary:     Effort: Pulmonary effort is normal.     Breath sounds: Normal breath sounds.  Abdominal:     General: Bowel sounds are normal.     Palpations: Abdomen is soft.  Musculoskeletal:     Cervical back: Normal range of motion.  Skin:    General: Skin is warm and dry.  Neurological:     Mental Status: She is alert and oriented to person, place, and time.      CMP Latest Ref Rng & Units 03/28/2020  Glucose 70 - 99 mg/dL 112(H)  BUN  6 - 20 mg/dL 12  Creatinine 0.44 - 1.00 mg/dL 1.06(H)  Sodium 135 - 145 mmol/L 137  Potassium 3.5 - 5.1 mmol/L 3.8  Chloride 98 - 111 mmol/L 107  CO2 22 - 32 mmol/L 24  Calcium 8.9 - 10.3 mg/dL 9.2  Total Protein 6.5 - 8.1 g/dL 6.8  Total Bilirubin 0.3 - 1.2 mg/dL 1.9(H)  Alkaline Phos 38 - 126 U/L 50  AST 15 - 41 U/L 40  ALT 0 - 44 U/L 11   CBC Latest Ref Rng & Units 05/05/2020  WBC 3.4 - 10.8 x10E3/uL 2.6(L)  Hemoglobin 11.1 - 15.9 g/dL 6.8(LL)  Hematocrit 34.0 - 46.6 % 24.9(L)  Platelets 150 - 450 x10E3/uL 415      Assessment and plan- Patient is a 50 y.o. female with history of iron deficiency anemia and thrombocytosis here for routine follow-up  Patient continues to have significant microcytic anemia and her hemoglobin is 6.8 today.  Ferritin checked a month ago was low at 5.B12 levels at that time were normal.  Smear review also revealed microcytic hypochromic anemia consistent with iron deficiency.Patient has received both Feraheme and Venofer in the past and received 400 mg of Venofer on 03/25/2020.  Patient does not have any insurance presently and will proceed with 2 doses of Feraheme starting next week.  I will hold off on a blood transfusion at this time.  Repeat CBC in 1 month.   Repeat CBC ferritin and iron studies in 2 1 4  months and I will see her in  4 months  Thrombocytosis: Mild and relatively stable    Visit Diagnosis 1. Iron deficiency anemia, unspecified iron deficiency anemia type   2. Thrombocytosis      Dr. Randa Evens, MD, MPH [facility] [id number] 05/07/2020 1:02 PM

## 2020-05-11 ENCOUNTER — Ambulatory Visit: Payer: Medicaid Other | Admitting: Gerontology

## 2020-05-11 ENCOUNTER — Other Ambulatory Visit: Payer: Self-pay

## 2020-05-11 ENCOUNTER — Encounter: Payer: Self-pay | Admitting: Gerontology

## 2020-05-11 ENCOUNTER — Other Ambulatory Visit: Payer: Self-pay | Admitting: Gerontology

## 2020-05-11 VITALS — BP 134/83 | HR 93 | Temp 97.1°F | Resp 16 | Wt 170.4 lb

## 2020-05-11 DIAGNOSIS — H6123 Impacted cerumen, bilateral: Secondary | ICD-10-CM

## 2020-05-11 DIAGNOSIS — D259 Leiomyoma of uterus, unspecified: Secondary | ICD-10-CM

## 2020-05-11 DIAGNOSIS — I10 Essential (primary) hypertension: Secondary | ICD-10-CM

## 2020-05-11 DIAGNOSIS — Z0000 Encounter for general adult medical examination without abnormal findings: Secondary | ICD-10-CM | POA: Insufficient documentation

## 2020-05-11 DIAGNOSIS — E569 Vitamin deficiency, unspecified: Secondary | ICD-10-CM | POA: Insufficient documentation

## 2020-05-11 DIAGNOSIS — H6120 Impacted cerumen, unspecified ear: Secondary | ICD-10-CM | POA: Insufficient documentation

## 2020-05-11 DIAGNOSIS — D500 Iron deficiency anemia secondary to blood loss (chronic): Secondary | ICD-10-CM

## 2020-05-11 MED ORDER — VITAMIN D (ERGOCALCIFEROL) 1.25 MG (50000 UNIT) PO CAPS
50000.0000 [IU] | ORAL_CAPSULE | ORAL | 0 refills | Status: DC
Start: 2020-05-11 — End: 2020-09-23

## 2020-05-11 MED ORDER — FERROUS SULFATE 325 (65 FE) MG PO TABS: 325.0000 mg | ORAL_TABLET | Freq: Every day | ORAL | 1 refills | Status: DC

## 2020-05-11 MED ORDER — DEBROX 6.5 % OT SOLN: 5.0000 [drp] | Freq: Two times a day (BID) | OTIC | 0 refills | Status: DC

## 2020-05-11 NOTE — Progress Notes (Signed)
Established Patient Office Visit  Subjective:  Patient ID: Dana Dunlap, female    DOB: 04/09/1971  Age: 50 y.o. MRN: [id number]  CC: No chief complaint on file.   HPI Dana Dunlap presents for follow up of hypertension and lab review. She is compliant with her medications and denies side effects. She has a history of hypertension, does not check her blood pressure at home, but adheres to her medication and DASH diet. She reports that she didn't take her medication prior to her appointment because she didn't want to be late. She denies headache, dizziness and peripheral edema. She has a history of Iron deficiency anemia (IDA) and was seen by Dr Janese Banks on 05/07/20 for Thrombocytosis, IDA and was scheduled for iron infusion tomorrow. She also has a history of uterine fibroid and didn't follow up with Ob/Gyn. She also reports that her ear "pops" sometimes and right ear "vibrates". She denies otalgia, discharge and tinnitus. She will follow up with Gastroenterology Dr Allen Norris on 05/26/20 for a 6 mm enhancing lesion within left hepatic lobe and 8 mm low-attenuation structure in right lobe of liver. These are both too small to reliably characterize seen on CT scan done on 03/22/20. She denies jaundice and right upper quadrant abdominal pain. She is due for Colonoscopy, Mammogram and Pap smear. Her Vitamin D done on 05/05/20 was 10.4 ng/ml. Overall, she states that she's doing well and offers no further complaint.  Past Medical History:  Diagnosis Date  . Depression    Currently on Cymbalta  . Eye abnormalities   . Fibroids   . Frequent headaches    Worse during monthly menstrual cycle  . Hypertension     Past Surgical History:  Procedure Laterality Date  . BREAST MASS EXCISION     benign  . KNEE SURGERY      Family History  Problem Relation Age of Onset  . Hypertension Mother   . Hyperlipidemia Mother   . Cancer Father        Prostate cancer  . Hypertension Father   . Hyperlipidemia Father    . Stroke Maternal Aunt   . Stroke Paternal Aunt   . Diabetes Paternal Aunt   . Sickle cell trait Other   . Thalassemia Other     Social History   Socioeconomic History  . Marital status: Single    Spouse name: Not on file  . Number of children: 0  . Years of education: 16  . Highest education level: Bachelor's degree (e.g., BA, AB, BS)  Occupational History  . 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 Smoking status: Never Smoker  . Smokeless tobacco: Never Used  Vaping Use  . Vaping Use: Never used  Substance and Sexual Activity  . Alcohol use: Yes    Alcohol/week: 1.0 standard drink    Types: 1 Glasses of wine per week  . Drug use: No  . Sexual activity: Yes    Birth control/protection: Condom  Other Topics Concern  . Not on file  Social History Narrative   [name] is originally from [place]. She attended [employer] in [place] where she obtained her Therapist, nutritional in Biology in 1995. 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 She is in a long term relationship with her boyfried, [name]. They have been together for 6 years. [name] transferred to [facility] with  [employer] in January. She enjoys reading and she enjoys the outdoors. She loves to travel. 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   Social Determinants of Health   Financial Resource Strain: Not on file  Food Insecurity: Not on file  Transportation Needs: Not on file  Physical Activity: Insufficiently Active  . Days of Exercise per Week: 2 days  . Minutes of Exercise per Session: 30 min  Stress: Not on file  Social Connections: Not on file  Intimate Partner Violence: Not on file    Outpatient Medications Prior to Visit  Medication Sig Dispense Refill  . acetaminophen (TYLENOL) 325 MG tablet Take 650 mg by mouth every 6 (six) hours as needed.    Marland Kitchen amLODipine  (NORVASC) 5 MG tablet Take 1 tablet (5 mg total) by mouth daily. 30 tablet 1  . DULoxetine (CYMBALTA) 30 MG capsule Three tablets po daily 90 capsule 2  . fluticasone (FLONASE) 50 MCG/ACT nasal spray Place 1 spray into both nostrils daily. 1 g 2  . Multiple Vitamin (MULTIVITAMIN) capsule Take 1 capsule by mouth daily. 30 capsule 1  . traZODone (DESYREL) 50 MG tablet Take 2 tablets (100 mg total) by mouth at bedtime. 60 tablet 0  . ferrous sulfate (FEOSOL) 325 (65 FE) MG tablet Take 1 tablet (325 mg total) by mouth daily with breakfast. 60 tablet 1   No facility-administered medications prior to visit.    Allergies  Allergen Reactions  . Prochlorperazine Anaphylaxis and Other (See Comments)    Seizure Seizure  . Compazine [Prochlorperazine Edisylate] Other (See Comments)    Seizure   . Other     Sneezing and red eyes  . Tramadol Hives    ROS Review of Systems  Constitutional: Positive for fatigue.  Eyes: Negative.   Respiratory: Negative.   Cardiovascular: Negative.   Endocrine: Negative.   Genitourinary:       Uterine Fibroid  Neurological: Negative.   Psychiatric/Behavioral: Negative.       Objective:    Physical Exam HENT:     Head: Normocephalic and atraumatic.  Cardiovascular:     Rate and Rhythm: Normal rate and regular rhythm.     Pulses: Normal pulses.     Heart sounds: Normal heart sounds.  Pulmonary:     Effort: Pulmonary effort is normal.     Breath sounds: Normal breath sounds.  Abdominal:     General: Bowel sounds are normal.     Palpations: Abdomen is soft.  Skin:    General: Skin is warm.  Neurological:     General: No focal deficit present.     Mental Status: She is alert. Mental status is at baseline.  Psychiatric:        Mood and Affect: Mood normal.        Behavior: Behavior normal.        Thought Content: Thought content normal.        Judgment: Judgment normal.     BP 134/83 (BP Location: Left Arm, Patient Position: Sitting, Cuff  Size: Large)   Pulse 93   Temp (!) 97.1 F (36.2 C)   Resp 16   Wt 170 lb 6.4 oz (77.3 kg)   SpO2 97%   BMI 33.28 kg/m  Wt Readings from Last 3 Encounters:  05/11/20 170 lb 6.4 oz (77.3 kg)  05/07/20 167 lb 12.8 oz (76.1 kg)  05/05/20 169 lb (76.7 kg)   Encouraged weight loss  Health Maintenance Due  Topic Date Due  .  Hepatitis C Screening  Never done  . COLONOSCOPY (Pts 45-30yrs Insurance coverage will need to be confirmed)  Never done  . PAP SMEAR-Modifier  11/12/2017  . MAMMOGRAM  Never done  . INFLUENZA VACCINE  Never done  . COVID-19 Vaccine (3 - Pfizer risk 4-dose series) 11/04/2019    There are no preventive care reminders to display for this patient.  Lab Results  Component Value Date   TSH 1.600 12/10/2018   Lab Results  Component Value Date   WBC 2.6 (L) 05/05/2020   HGB 6.8 (LL) 05/05/2020   HCT 24.9 (L) 05/05/2020   MCV 75 (L) 05/05/2020   PLT 415 05/05/2020   Lab Results  Component Value Date   NA 137 03/28/2020   K 3.8 03/28/2020   CO2 24 03/28/2020   GLUCOSE 112 (H) 03/28/2020   BUN 12 03/28/2020   CREATININE 1.06 (H) 03/28/2020   BILITOT 1.9 (H) 03/28/2020   ALKPHOS 50 03/28/2020   AST 40 03/28/2020   ALT 11 03/28/2020   PROT 6.8 03/28/2020   ALBUMIN 3.6 03/28/2020   CALCIUM 9.2 03/28/2020   ANIONGAP 6 03/28/2020   GFR 91.35 06/11/2017   Lab Results  Component Value Date   CHOL 194 12/10/2018   Lab Results  Component Value Date   HDL 53 12/10/2018   Lab Results  Component Value Date   LDLCALC 124 (H) 12/10/2018   Lab Results  Component Value Date   TRIG 94 12/10/2018   Lab Results  Component Value Date   CHOLHDL 3.7 12/10/2018   Lab Results  Component Value Date   HGBA1C 5.2 03/21/2016      Assessment & Plan:   1. Iron deficiency anemia due to chronic blood loss -She will continue on current treatment regimen and follow up with Hematologist Dr Janese Banks - ferrous sulfate (FEOSOL) 325 (65 FE) MG tablet; Take 1 tablet  (325 mg total) by mouth daily with breakfast.  Dispense: 60 tablet; Refill: 1 -  2. Essential hypertension - Her blood pressure is improving, she will continue on current treatment regimen, DASH diet and exercise as tolerated.  3. Uterine leiomyoma, unspecified location - She will follow up with Ob/Gyn - Ambulatory referral to Obstetrics / Gynecology  4. Health care maintenance - She was advised to schedule appointment for Mammogram and Pap smear and Colonoscopy screening. - Ambulatory referral to Hematology / Oncology - Ambulatory referral to Gastroenterology  5. Vitamin deficiency - She will continue on weekly Vit D, educated on side effects and advised to notify clinic. -Vitamin D, Ergocalciferol, (DRISDOL) 1.25 MG (50000 UNIT) CAPS capsule; Take 1 capsule (50,000 Units total) by mouth every 7 (seven) days.  Dispense: 12 capsule; Refill: 0   6. Bilateral impacted cerumen - She has bilateral cerumen and will use Debrox. Was educated on medication side effects and advised to notify clinic. - carbamide peroxide (DEBROX) 6.5 % OTIC solution; Place 5 drops into both ears 2 (two) times daily.  Dispense: 15 mL; Refill: 0     Follow-up: Return in about 3 months (around 08/11/2020), or if symptoms worsen or fail to improve.    Qasim Diveley Jerold Coombe, NP

## 2020-05-11 NOTE — Patient Instructions (Signed)

## 2020-05-12 ENCOUNTER — Other Ambulatory Visit: Payer: Self-pay | Admitting: Oncology

## 2020-05-12 ENCOUNTER — Inpatient Hospital Stay: Payer: Self-pay

## 2020-05-12 VITALS — BP 132/74 | HR 68 | Temp 98.9°F | Resp 18

## 2020-05-12 DIAGNOSIS — D500 Iron deficiency anemia secondary to blood loss (chronic): Secondary | ICD-10-CM

## 2020-05-12 MED ORDER — SODIUM CHLORIDE 0.9 % IV SOLN
Freq: Once | INTRAVENOUS | Status: AC
  Filled 2020-05-12: qty 250

## 2020-05-12 MED ORDER — SODIUM CHLORIDE 0.9 % IV SOLN
510.0000 mg | INTRAVENOUS | Status: DC
  Administered 2020-05-12: 510 mg via INTRAVENOUS
  Filled 2020-05-12: qty 510

## 2020-05-12 NOTE — Progress Notes (Signed)
1508- Patient tolerated Feraheme infusion well. Patient and vital signs stable. Patient discharged to home at this time.

## 2020-05-13 NOTE — Progress Notes (Signed)
The following Medication: Dana Dunlap has been approved thru [employer] as Assistance Program. Enrollment period is 05/12/2020 to 05/12/2021.  Assistance ID: [id number]. Reason for Assistance: Self pay First DOS: 05/12/2020  Madalyn Rob, CPhT IV Drug Replacement Specialist  [place] Phone: [phone]

## 2020-05-19 ENCOUNTER — Ambulatory Visit: Payer: Self-pay | Admitting: Licensed Clinical Social Worker

## 2020-05-19 ENCOUNTER — Other Ambulatory Visit: Payer: Self-pay

## 2020-05-19 DIAGNOSIS — F32A Depression, unspecified: Secondary | ICD-10-CM

## 2020-05-19 DIAGNOSIS — F419 Anxiety disorder, unspecified: Secondary | ICD-10-CM

## 2020-05-19 NOTE — BH Specialist Note (Signed)
Integrated Behavioral Health Follow Up In-Person Visit  MRN: [id number] Name: Dana Dunlap   Total time: 60 minutes  Types of Service: [place] (BHI)   Patient consents to updox visit and 2 patient identifiers were used to identify patient  Interpretor:No. Interpretor Name and Language:   Subjective: Dana Dunlap is a 50 y.o. female accompanied by herself Patient was referred by Carlyon Shadow, NP  for mental health Patient reports the following symptoms/concerns: The patient reports that she had a panic attack last week which resulted in her calling 911. She noted that she started to feel like she was going to pass out again. She stated that a bunch of men came to her home and that made her panic more, but they checked her heart and said she was fine and was just having a panic attack. The patient discussed feeling embarrassed and regretting calling 911. The patient discussed going to [place] group support and thought it really helped her. She discussed familial stressors in her life. She noted that she received a telephone call from the [place] in [place] and that it really encouraged her. She noted that she really wants validation from her mother, and is struggling with the prospect of not getting that from her. The patient discussed her desire to help other women who are struggling with fibroids. She noted that it made her feel good to help another member in her support group. The patient said that she looking forward to a walk outside this afternoon. The patient denied suicidal or homicidal thoughts.   Duration of problem:; Severity of problem: moderate  Objective: Mood: Euthymic and Affect: Appropriate Risk of harm to self or others: No plan to harm self or others  Life Context: Family and Social: see above School/Work: see above Self-Care: see above Life Changes: see above  Patient and/or Family's Strengths/Protective Factors: Concrete  supports in place (healthy food, safe environments, etc.)  Goals Addressed: Patient will: 1.  Reduce symptoms of: anxiety, depression and mood instability  2.  Increase knowledge and/or ability of: coping skills, healthy habits and self-management skills  3.  Demonstrate ability to: Increase healthy adjustment to current life circumstances  Progress towards Goals: Ongoing  Interventions: Interventions utilized:  Supportive Counseling was utilized by the clinician during today's follow up session. The clinician processed with the patient how they have been doing since the last follow-up session. The clinician provided a space for the patient to ventilate their frustrations regarding their current life circumstances. Clinician measured the patient's anxiety and depression on a numerical scale. Clinician encouraged the patient to identify her negative intrusive thoughts and record them in a journal and bring that to the next follow-up session. The clinician encouraged the patient to utilize their coping skills to deal with their current life circumstances.  Standardized Assessments completed: GAD-7 and PHQ 2  GAD-7   16 PHQ-9    17  Assessment: Patient currently experiencing see above.   Patient may benefit from see above  Plan: 1. Follow up with behavioral health clinician on : 06/01/2020 at 12:30 PM 2. Behavioral recommendations:  3. Referral(s): [place] (In Clinic) 4. "From scale of 1-10, how likely are you to follow plan?":  Lesli Albee, Student-Social Work

## 2020-05-20 ENCOUNTER — Inpatient Hospital Stay: Payer: Self-pay

## 2020-05-20 VITALS — BP 146/83 | HR 77 | Temp 97.0°F | Resp 18

## 2020-05-20 DIAGNOSIS — D500 Iron deficiency anemia secondary to blood loss (chronic): Secondary | ICD-10-CM

## 2020-05-20 MED ORDER — SODIUM CHLORIDE 0.9 % IV SOLN
510.0000 mg | INTRAVENOUS | Status: DC
  Administered 2020-05-20: 510 mg via INTRAVENOUS
  Filled 2020-05-20: qty 17

## 2020-05-20 MED ORDER — SODIUM CHLORIDE 0.9 % IV SOLN
Freq: Once | INTRAVENOUS | Status: AC
  Filled 2020-05-20: qty 250

## 2020-05-26 ENCOUNTER — Ambulatory Visit (INDEPENDENT_AMBULATORY_CARE_PROVIDER_SITE_OTHER): Payer: Self-pay | Admitting: Gastroenterology

## 2020-05-26 ENCOUNTER — Other Ambulatory Visit
Admission: RE | Admit: 2020-05-26 | Discharge: 2020-05-26 | Disposition: A | Discharge status: Home / Self Care | Payer: Self-pay | Attending: Gastroenterology | Admitting: Gastroenterology

## 2020-05-26 ENCOUNTER — Encounter: Payer: Self-pay | Admitting: Gastroenterology

## 2020-05-26 ENCOUNTER — Other Ambulatory Visit: Payer: Self-pay

## 2020-05-26 ENCOUNTER — Telehealth: Payer: Self-pay | Admitting: Urology

## 2020-05-26 VITALS — BP 137/80 | HR 78 | Ht 60.0 in | Wt 172.0 lb

## 2020-05-26 DIAGNOSIS — R932 Abnormal findings on diagnostic imaging of liver and biliary tract: Secondary | ICD-10-CM

## 2020-05-26 DIAGNOSIS — D500 Iron deficiency anemia secondary to blood loss (chronic): Secondary | ICD-10-CM | POA: Insufficient documentation

## 2020-05-26 DIAGNOSIS — R7989 Other specified abnormal findings of blood chemistry: Secondary | ICD-10-CM

## 2020-05-26 LAB — BILIRUBIN, FRACTIONATED(TOT/DIR/INDIR)
Bilirubin, Direct: <0.1 mg/dL (ref 0.0–0.2)
Total Bilirubin: 0.2 mg/dL — ABNORMAL LOW (ref 0.3–1.2)

## 2020-05-26 NOTE — Progress Notes (Signed)
Gastroenterology Consultation  Referring Provider:     Abbie Sons, MD Primary Care Physician:  Pcp, No Primary Gastroenterologist:  Dr. Allen Norris     Reason for Consultation:     Abnormal CT scan of the abdomen        HPI:   Dana Dunlap is a 50 y.o. y/o female referred for consultation & management of abnormal CT scan of the abdomen by Dr. Merryl Hacker, No. This patient comes in today with a history of iron deficiency anemia for which she is seeing hematology and receiving iron infusions. The patient was noted on a CT scan to have liver lesions and was referred to me. The patient's CT scan of the abdomen done for hematuria showed:  IMPRESSION: 1. No acute findings and no explanation for patient's hematuria. 2. There is a 6 mm enhancing lesion within left hepatic lobe and 8 mm low-attenuation structure in right lobe of liver. These are both too small to reliably characterize. 3. Enlarged fibroid uterus.  The patient has also been found to have intermittent elevation of her bilirubin without any fractionation of the bilirubin to look for the source. Her trends of her bilirubin have shown:  Component     Latest Ref Rng & Units 12/10/2018 12/11/2018 12/12/2018 12/13/2018             ALT     0 - 44 U/L 11 14 14 17   Alkaline Phosphatase     38 - 126 U/L 49 44 43 41  Total Bilirubin     0.3 - 1.2 mg/dL 0.9 2.2 (H) 2.3 (H) 0.4   Component     Latest Ref Rng & Units 12/16/2018 03/24/2020 03/24/2020 03/28/2020          6:29 AM 11:14 AM   ALT     0 - 44 U/L 17  11 11   Alkaline Phosphatase     38 - 126 U/L 47  44 50  Total Bilirubin     0.3 - 1.2 mg/dL 0.3  0.3 1.9 (H)   With all the labs prior to September 2020 showing normal bilirubin.  The patient reports that she has been having a lot of vaginal bleeding for some time and states that she has been told that she bleeds from her fibroids.  The patient is hesitant to undergo any surgical intervention such as a hysterectomy and moist her  opinion that she would like to have uterine artery embolization.  The patient was unhappy with the last gynecologists she saw.  Past Medical History:  Diagnosis Date  . Depression    Currently on Cymbalta  . Eye abnormalities   . Fibroids   . Frequent headaches    Worse during monthly menstrual cycle  . Hypertension     Past Surgical History:  Procedure Laterality Date  . BREAST MASS EXCISION     benign  . KNEE SURGERY      Prior to Admission medications   Medication Sig Start Date End Date Taking? Authorizing Provider  acetaminophen (TYLENOL) 325 MG tablet Take 650 mg by mouth every 6 (six) hours as needed.    [provider]  amLODipine (NORVASC) 5 MG tablet Take 1 tablet (5 mg total) by mouth daily. 04/13/20   Regalado, Belkys A, MD  carbamide peroxide (DEBROX) 6.5 % OTIC solution Place 5 drops into both ears 2 (two) times daily. 05/11/20   Iloabachie, Chioma E, NP  DULoxetine (CYMBALTA) 30 MG capsule Three tablets po daily 05/05/20  Iloabachie, Chioma E, NP  ferrous sulfate (FEOSOL) 325 (65 FE) MG tablet Take 1 tablet (325 mg total) by mouth daily with breakfast. 05/11/20   Iloabachie, Chioma E, NP  fluticasone (FLONASE) 50 MCG/ACT nasal spray Place 1 spray into both nostrils daily. 04/13/20   Regalado, Jerald Kief A, MD  Multiple Vitamin (MULTIVITAMIN) capsule Take 1 capsule by mouth daily. 04/13/20   Regalado, Belkys A, MD  traZODone (DESYREL) 50 MG tablet Take 2 tablets (100 mg total) by mouth at bedtime. 05/05/20   Iloabachie, Chioma E, NP  Vitamin D, Ergocalciferol, (DRISDOL) 1.25 MG (50000 UNIT) CAPS capsule Take 1 capsule (50,000 Units total) by mouth every 7 (seven) days. 05/11/20   Iloabachie, Chioma E, NP    Family History  Problem Relation Age of Onset  . Hypertension Mother   . Hyperlipidemia Mother   . Cancer Father        Prostate cancer  . Hypertension Father   . Hyperlipidemia Father   . Stroke Maternal Aunt   . Stroke Paternal Aunt   . Diabetes Paternal Aunt    . Sickle cell trait Other   . Thalassemia Other      Social History   Tobacco Use  . Smoking status: Never Smoker  . Smokeless tobacco: Never Used  Vaping Use  . Vaping Use: Never used  Substance Use Topics  . Alcohol use: Yes    Alcohol/week: 1.0 standard drink    Types: 1 Glasses of wine per week  . Drug use: No    Allergies as of 05/26/2020 - Review Complete 05/07/2020  Allergen Reaction Noted  . Prochlorperazine Anaphylaxis and Other (See Comments) 11/11/2012  . Compazine [prochlorperazine edisylate] Other (See Comments) 11/11/2012  . Other  04/14/2019  . Tramadol Hives 05/21/2019    Review of Systems:    All systems reviewed and negative except where noted in HPI.   Physical Exam:  There were no vitals taken for this visit. No LMP recorded. General:   Alert,  Well-developed, well-nourished, pleasant and cooperative in NAD Head:  Normocephalic and atraumatic. Eyes:  Sclera clear, no icterus.   Conjunctiva pink. Ears:  Normal auditory acuity. Neck:  Supple; no masses or thyromegaly. Lungs:  Respirations even and unlabored.  Clear throughout to auscultation.   No wheezes, crackles, or rhonchi. No acute distress. Heart:  Regular rate and rhythm; no murmurs, clicks, rubs, or gallops. Abdomen:  Normal bowel sounds.  No bruits.  Soft, non-tender and non-distended without masses, hepatosplenomegaly or hernias noted.  No guarding or rebound tenderness.  Negative Carnett sign.   Rectal:  Deferred.  Pulses:  Normal pulses noted. Extremities:  No clubbing or edema.  No cyanosis. Neurologic:  Alert and oriented x3;  grossly normal neurologically. Skin:  Intact without significant lesions or rashes.  No jaundice. Lymph Nodes:  No significant cervical adenopathy. Psych:  Alert and cooperative. Normal mood and affect.  Imaging Studies: No results found.  Assessment and Plan:   Dana Dunlap is a 50 y.o. y/o female with chronic anemia on iron infusion. The patient has  vaginal bleeding but has never had a colonoscopy in the past.  The patient also was found to have small liver lesions that were too small to be characterized.  The patient will be set up for a colonoscopy in addition to having an MRI to better delineate what these liver lesions may be.  The patient will also have her bilirubin checked to see if it is direct or indirect bilirubin thereby telling  me if it is from a liver source or not.  The patient has been explained the plan and agrees with it.    Lucilla Lame, MD. Marval Regal    Note: This dictation was prepared with Dragon dictation along with smaller phrase technology. Any transcriptional errors that result from this process are unintentional.

## 2020-05-26 NOTE — Telephone Encounter (Signed)
Pt called office about CT scan that [facility] ordered.  She saw GI dr today, but had some questions for [place] about CT scan.

## 2020-05-26 NOTE — Telephone Encounter (Signed)
Patient states she is going to read her ct scan results on my chart

## 2020-05-27 ENCOUNTER — Telehealth: Payer: Self-pay

## 2020-05-27 NOTE — Telephone Encounter (Signed)
-----   Message from Lucilla Lame, MD sent at 05/26/2020 10:15 PM EST ----- The patient know that her bilirubin is back to low levels and in fact is lower than normal at 0.2.  No further workup for her increased bilirubin is needed at this time.

## 2020-05-27 NOTE — Telephone Encounter (Signed)
Pt notified of results through my chart

## 2020-05-28 ENCOUNTER — Ambulatory Visit: Payer: Self-pay

## 2020-05-28 ENCOUNTER — Other Ambulatory Visit: Payer: Self-pay

## 2020-06-01 ENCOUNTER — Other Ambulatory Visit: Payer: Self-pay

## 2020-06-01 ENCOUNTER — Ambulatory Visit: Payer: Medicaid Other | Admitting: Licensed Clinical Social Worker

## 2020-06-01 ENCOUNTER — Ambulatory Visit: Payer: Self-pay | Admitting: Licensed Clinical Social Worker

## 2020-06-01 DIAGNOSIS — F419 Anxiety disorder, unspecified: Secondary | ICD-10-CM

## 2020-06-01 DIAGNOSIS — F32A Depression, unspecified: Secondary | ICD-10-CM

## 2020-06-01 NOTE — BH Specialist Note (Signed)
Integrated Behavioral Health Follow Up In-Person Visit  MRN: [id number] Name: Dana Dunlap   Total time: 30 minutes  Types of Service: Individual psychotherapy and [place] (BHI)  Interpretor:No. Interpretor Name and Language:   Patient consents to telephone visit and 2 patient identifiers were used to identify patient  Subjective: Dana Dunlap is a 50 y.o. female accompanied by herself Patient was referred by Carlyon Shadow, NP for mental health. Patient reports the following symptoms/concerns: The patient stated that she cried during and after her appointment with a GI doctor. She noted that she did not expect the doctor to do a physical exam and check her fibroids and was shocked and confused during the exam. She stated that after the exam was over she felt violated and called her friend for support. She noted her friend explained that since her body systems are all connected the physician was just doing their job and checking her fibroids by pushing on her abdomin was probably part of that process. The patient stated that she calmed down and agreed with her friend, but still wishes she knew what to expect before the exam instead of being surprised by it. The patient noted that she has put up several positive affirmation in her home and that has helped her feel better this week. She discussed relationship stressors with her mother. She discussed her interactions with the [place] in [place], [place]. The patient denied any suicidal or homicidal thoughts.  Duration of problem: ; Severity of problem: moderate  Objective: Mood: Euthymic and Affect: Appropriate Risk of harm to self or others: No plan to harm self or others  Life Context: Family and Social: see above School/Work: see above Self-Care: see above Life Changes: see above  Patient and/or Family's Strengths/Protective Factors: Concrete supports in place (healthy food, safe  environments, etc.)  Goals Addressed: Patient will: 1.  Reduce symptoms of: anxiety, depression and mood instability  2.  Increase knowledge and/or ability of: coping skills, healthy habits and self-management skills  3.  Demonstrate ability to: Increase healthy adjustment to current life circumstances  Progress towards Goals: Ongoing  Interventions: Interventions utilized:  CBT Cognitive Behavioral Therapy was utilized by the clinician during today's follow up session. The clinician processed with the patient how they have been doing since the last follow-up session. The clinician provided a space for the patient to ventilate their frustrations regarding their current life circumstances. Clinician measured the patient's anxiety and depression on a numerical scale. The clinician encouraged the patient to utilize their coping skills to deal with their current life circumstances. Clinician explored Automatic thoughts with the patient. Clinician encouraged the patient to keep a medical journal and write down all her questions and concerns so that she would be prepared for future visits and to advocate for herself by letting physicians know when she felt uncomfortable.  Standardized Assessments completed: GAD-7 and PHQ 9  GAD-7   16 PHQ-9   10   Assessment: Patient currently experiencing see above   Patient may benefit from see above  Plan: 1. Follow up with behavioral health clinician on : 06/15/2020 at 12:00 PM  2. Behavioral recommendations:  3. Referral(s): [facility] (In Clinic) 4. "From scale of 1-10, how likely are you to follow plan?":   Dana Dunlap, Student-Social Work

## 2020-06-04 ENCOUNTER — Other Ambulatory Visit: Payer: Self-pay | Admitting: Psychiatry

## 2020-06-04 ENCOUNTER — Other Ambulatory Visit: Payer: Self-pay

## 2020-06-04 DIAGNOSIS — D500 Iron deficiency anemia secondary to blood loss (chronic): Secondary | ICD-10-CM

## 2020-06-04 DIAGNOSIS — D509 Iron deficiency anemia, unspecified: Secondary | ICD-10-CM

## 2020-06-08 ENCOUNTER — Ambulatory Visit: Payer: Self-pay | Attending: Oncology

## 2020-06-08 ENCOUNTER — Other Ambulatory Visit: Payer: Self-pay

## 2020-06-08 ENCOUNTER — Ambulatory Visit
Admission: RE | Admit: 2020-06-08 | Discharge: 2020-06-08 | Disposition: A | Discharge status: Home / Self Care | Payer: Medicaid Other | Source: Ambulatory Visit | Attending: Oncology | Admitting: Oncology

## 2020-06-08 ENCOUNTER — Inpatient Hospital Stay: Payer: Medicaid Other

## 2020-06-08 VITALS — BP 115/92 | HR 66 | Temp 97.9°F | Ht 61.42 in | Wt 169.9 lb

## 2020-06-08 DIAGNOSIS — Z0000 Encounter for general adult medical examination without abnormal findings: Secondary | ICD-10-CM

## 2020-06-08 NOTE — Progress Notes (Signed)
  Subjective:     Patient ID: Dana Dunlap, female   DOB: 17-Jun-1970, 50 y.o.   MRN: [id number]  HPI   Review of Systems     Objective:   Physical Exam Chest:  Breasts:     Right: Normal.     Left: Normal.        Comments: Surgical scar right areola       Assessment:     50 year old patient presents for [facility] clinic visit.  Patient screened, and meets BCCCP eligibility.  She reports she had a fibroadenoma removed from her right breast. Patient does not have insurance, Medicare or Medicaid. Instructed patient on breast self awareness using teach back method.  Clinical breast exam unremarkable.  No mass or lump palpated.  Risk Assessment    Risk Scores      06/08/2020   Last edited by: Rico Junker, RN   5-year risk: 1.3 %   Lifetime risk: 8.9 %            Plan:    Sent for bilateral screening mammogram.

## 2020-06-09 ENCOUNTER — Ambulatory Visit: Payer: Self-pay

## 2020-06-09 ENCOUNTER — Inpatient Hospital Stay: Payer: Medicaid Other

## 2020-06-09 ENCOUNTER — Other Ambulatory Visit: Payer: Self-pay

## 2020-06-09 ENCOUNTER — Inpatient Hospital Stay: Payer: Self-pay | Attending: Oncology

## 2020-06-09 DIAGNOSIS — D509 Iron deficiency anemia, unspecified: Secondary | ICD-10-CM

## 2020-06-09 LAB — FERRITIN: Ferritin: 145 ng/mL (ref 11–307)

## 2020-06-09 LAB — CBC WITH DIFFERENTIAL/PLATELET
Abs Immature Granulocytes: 0.01 10*3/uL (ref 0.00–0.07)
Basophils Absolute: 0 10*3/uL (ref 0.0–0.1)
Basophils Relative: 1 %
Eosinophils Absolute: 0 10*3/uL (ref 0.0–0.5)
Eosinophils Relative: 1 %
HCT: 38.1 % (ref 36.0–46.0)
Hemoglobin: 11.7 g/dL — ABNORMAL LOW (ref 12.0–15.0)
Immature Granulocytes: 0 %
Lymphocytes Relative: 45 %
Lymphs Abs: 1.5 10*3/uL (ref 0.7–4.0)
MCH: 23.7 pg — ABNORMAL LOW (ref 26.0–34.0)
MCHC: 30.7 g/dL (ref 30.0–36.0)
MCV: 77.1 fL — ABNORMAL LOW (ref 80.0–100.0)
Monocytes Absolute: 0.2 10*3/uL (ref 0.1–1.0)
Monocytes Relative: 6 %
Neutro Abs: 1.5 10*3/uL — ABNORMAL LOW (ref 1.7–7.7)
Neutrophils Relative %: 47 %
Platelets: 339 10*3/uL (ref 150–400)
RBC: 4.94 MIL/uL (ref 3.87–5.11)
RDW: 21.6 % — ABNORMAL HIGH (ref 11.5–15.5)
WBC: 3.3 10*3/uL — ABNORMAL LOW (ref 4.0–10.5)
nRBC: 0 % (ref 0.0–0.2)

## 2020-06-09 LAB — IRON AND TIBC
Iron: 33 ug/dL (ref 28–170)
Saturation Ratios: 11 % (ref 10.4–31.8)
TIBC: 297 ug/dL (ref 250–450)
UIBC: 264 ug/dL

## 2020-06-14 ENCOUNTER — Other Ambulatory Visit: Payer: Self-pay

## 2020-06-14 DIAGNOSIS — N63 Unspecified lump in unspecified breast: Secondary | ICD-10-CM

## 2020-06-15 ENCOUNTER — Ambulatory Visit: Payer: Medicaid Other | Admitting: Licensed Clinical Social Worker

## 2020-06-15 ENCOUNTER — Other Ambulatory Visit: Payer: Self-pay | Admitting: *Deleted

## 2020-06-15 DIAGNOSIS — R920 Mammographic microcalcification found on diagnostic imaging of breast: Secondary | ICD-10-CM

## 2020-06-16 ENCOUNTER — Ambulatory Visit
Admission: RE | Admit: 2020-06-16 | Discharge: 2020-06-16 | Disposition: A | Discharge status: Home / Self Care | Payer: Medicaid Other | Source: Ambulatory Visit | Attending: Oncology | Admitting: Oncology

## 2020-06-16 ENCOUNTER — Other Ambulatory Visit: Payer: Self-pay

## 2020-06-16 ENCOUNTER — Other Ambulatory Visit: Payer: Self-pay | Admitting: *Deleted

## 2020-06-16 ENCOUNTER — Other Ambulatory Visit: Payer: Self-pay | Admitting: Oncology

## 2020-06-16 DIAGNOSIS — N63 Unspecified lump in unspecified breast: Secondary | ICD-10-CM

## 2020-06-16 DIAGNOSIS — R928 Other abnormal and inconclusive findings on diagnostic imaging of breast: Secondary | ICD-10-CM

## 2020-06-16 DIAGNOSIS — R920 Mammographic microcalcification found on diagnostic imaging of breast: Secondary | ICD-10-CM

## 2020-06-18 ENCOUNTER — Other Ambulatory Visit: Payer: Self-pay

## 2020-07-06 ENCOUNTER — Encounter: Payer: Medicaid Other | Admitting: Licensed Clinical Social Worker

## 2020-07-06 ENCOUNTER — Other Ambulatory Visit: Payer: Self-pay

## 2020-07-06 ENCOUNTER — Telehealth: Payer: Self-pay | Admitting: Licensed Clinical Social Worker

## 2020-07-06 ENCOUNTER — Other Ambulatory Visit: Payer: Self-pay | Admitting: Gerontology

## 2020-07-06 DIAGNOSIS — F419 Anxiety disorder, unspecified: Secondary | ICD-10-CM

## 2020-07-06 DIAGNOSIS — F32A Depression, unspecified: Secondary | ICD-10-CM

## 2020-07-06 MED ORDER — TRAZODONE HCL 50 MG PO TABS
100.0000 mg | ORAL_TABLET | Freq: Every day | ORAL | 0 refills | Status: DC
Start: 2020-07-06 — End: 2020-09-29
  Filled 2020-07-06: qty 60, 30d supply, fill #0

## 2020-07-06 NOTE — Telephone Encounter (Signed)
Sent the patient the updox link for today's scheduled appointment; no reply. Clinician then called the patient during today's scheduled appointment; the person answering the call stated that no one by that name lived there and stated [phone] was the wrong number.

## 2020-07-06 NOTE — Progress Notes (Signed)
This encounter was created in error - please disregard.

## 2020-07-07 ENCOUNTER — Other Ambulatory Visit: Payer: Self-pay

## 2020-07-08 ENCOUNTER — Other Ambulatory Visit: Payer: Self-pay

## 2020-07-13 ENCOUNTER — Ambulatory Visit: Payer: Medicaid Other | Admitting: Licensed Clinical Social Worker

## 2020-07-13 ENCOUNTER — Ambulatory Visit: Payer: Self-pay | Admitting: Licensed Clinical Social Worker

## 2020-07-13 ENCOUNTER — Other Ambulatory Visit: Payer: Self-pay

## 2020-07-13 DIAGNOSIS — F32A Depression, unspecified: Secondary | ICD-10-CM

## 2020-07-13 DIAGNOSIS — F419 Anxiety disorder, unspecified: Secondary | ICD-10-CM
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
## 2020-07-14 ENCOUNTER — Ambulatory Visit: Payer: Self-pay | Admitting: Urology

## 2020-07-16 ENCOUNTER — Inpatient Hospital Stay (HOSPITAL_BASED_OUTPATIENT_CLINIC_OR_DEPARTMENT_OTHER): Payer: Self-pay | Admitting: Oncology

## 2020-07-16 ENCOUNTER — Other Ambulatory Visit: Payer: Self-pay

## 2020-07-16 ENCOUNTER — Inpatient Hospital Stay: Payer: Medicaid Other | Attending: Oncology

## 2020-07-16 ENCOUNTER — Inpatient Hospital Stay: Payer: Medicaid Other

## 2020-07-16 VITALS — BP 137/90 | HR 81 | Temp 97.0°F | Resp 16 | Wt 171.9 lb

## 2020-07-16 VITALS — BP 140/90 | HR 88 | Resp 18

## 2020-07-16 DIAGNOSIS — E538 Deficiency of other specified B group vitamins: Secondary | ICD-10-CM

## 2020-07-16 DIAGNOSIS — N920 Excessive and frequent menstruation with regular cycle: Secondary | ICD-10-CM | POA: Insufficient documentation

## 2020-07-16 DIAGNOSIS — D500 Iron deficiency anemia secondary to blood loss (chronic): Secondary | ICD-10-CM | POA: Insufficient documentation

## 2020-07-16 DIAGNOSIS — D509 Iron deficiency anemia, unspecified: Secondary | ICD-10-CM

## 2020-07-16 LAB — IRON AND TIBC
Iron: 20 ug/dL — ABNORMAL LOW (ref 28–170)
Saturation Ratios: 7 % — ABNORMAL LOW (ref 10.4–31.8)
TIBC: 309 ug/dL (ref 250–450)
UIBC: 289 ug/dL

## 2020-07-16 LAB — CBC WITH DIFFERENTIAL/PLATELET
Abs Immature Granulocytes: 0.01 10*3/uL (ref 0.00–0.07)
Basophils Absolute: 0 10*3/uL (ref 0.0–0.1)
Basophils Relative: 1 %
Eosinophils Absolute: 0.2 10*3/uL (ref 0.0–0.5)
Eosinophils Relative: 6 %
HCT: 32.9 % — ABNORMAL LOW (ref 36.0–46.0)
Hemoglobin: 10.1 g/dL — ABNORMAL LOW (ref 12.0–15.0)
Immature Granulocytes: 0 %
Lymphocytes Relative: 48 %
Lymphs Abs: 1.5 10*3/uL (ref 0.7–4.0)
MCH: 22.6 pg — ABNORMAL LOW (ref 26.0–34.0)
MCHC: 30.7 g/dL (ref 30.0–36.0)
MCV: 73.8 fL — ABNORMAL LOW (ref 80.0–100.0)
Monocytes Absolute: 0.3 10*3/uL (ref 0.1–1.0)
Monocytes Relative: 9 %
Neutro Abs: 1.1 10*3/uL — ABNORMAL LOW (ref 1.7–7.7)
Neutrophils Relative %: 36 %
Platelets: 419 10*3/uL — ABNORMAL HIGH (ref 150–400)
RBC: 4.46 MIL/uL (ref 3.87–5.11)
RDW: 19.4 % — ABNORMAL HIGH (ref 11.5–15.5)
WBC: 3.1 10*3/uL — ABNORMAL LOW (ref 4.0–10.5)
nRBC: 0 % (ref 0.0–0.2)

## 2020-07-16 LAB — FERRITIN: Ferritin: 20 ng/mL (ref 11–307)

## 2020-07-16 MED ORDER — SODIUM CHLORIDE 0.9 % IV SOLN
Freq: Once | INTRAVENOUS | Status: AC
  Filled 2020-07-16: qty 250

## 2020-07-16 MED ORDER — SODIUM CHLORIDE 0.9 % IV SOLN
510.0000 mg | INTRAVENOUS | Status: DC
  Administered 2020-07-16: 510 mg via INTRAVENOUS
  Filled 2020-07-16: qty 510

## 2020-07-16 MED FILL — Duloxetine HCl Enteric Coated Pellets Cap 30 MG (Base Eq): ORAL | 30 days supply | Qty: 90 | Fill #0 | Status: CN

## 2020-07-18 ENCOUNTER — Encounter: Payer: Self-pay | Admitting: Oncology

## 2020-07-18 NOTE — Progress Notes (Signed)
Hematology/Oncology Consult note [facility]  Telephone:([phone] Fax:([phone]) [phone]  Patient Care Team: Langston Reusing, NP as PCP - General (Gerontology) Elmarie Shiley, MD as Consulting Physician (Internal Medicine) Rico Junker, RN as Registered Nurse Theodore Demark, RN as Registered Nurse   Name of the patient: Dana Dunlap  [id number]  04/09/1971   Date of visit: 07/18/20  Diagnosis- iron deficiency anemia  Chief complaint/ Reason for visit- routine f/u of iron deficiency anemia  Heme/Onc history: Patient is a 50 year old African-American female who was seen by Dr. Grayland Ormond for iron deficiency anemia and has required IV iron in the past.  She is transferring her care to me.  Etiology of iron deficiency anemia has been related to be heavy menstrual bleeding secondary to fibroids.  Patient also has history of thrombocytosis which has been attributed to iron deficiency.  Patient has concerns that she has sickle cell disease but on hemoglobinopathy evaluation was normal in 2018.  Interval history- Patient states she has an appointment with fibroids specialist in June at [facility]. She is looking for non hysterectomy options for her fibroids. Menstrual cycles continue to be heavy  ECOG PS- 0 Pain scale- 0   Review of systems- Review of Systems  Constitutional: Positive for malaise/fatigue. Negative for chills, fever and weight loss.  HENT: Negative for congestion, ear discharge and nosebleeds.   Eyes: Negative for blurred vision.  Respiratory: Negative for cough, hemoptysis, sputum production, shortness of breath and wheezing.   Cardiovascular: Negative for chest pain, palpitations, orthopnea and claudication.  Gastrointestinal: Negative for abdominal pain, blood in stool, constipation, diarrhea, heartburn, melena, nausea and vomiting.  Genitourinary: Negative for dysuria, flank pain, frequency, hematuria and urgency.  Musculoskeletal:  Negative for back pain, joint pain and myalgias.  Skin: Negative for rash.  Neurological: Negative for dizziness, tingling, focal weakness, seizures, weakness and headaches.  Endo/Heme/Allergies: Does not bruise/bleed easily.  Psychiatric/Behavioral: Negative for depression and suicidal ideas. The patient does not have insomnia.       Allergies  Allergen Reactions  . Prochlorperazine Anaphylaxis and Other (See Comments)    Seizure Seizure  . Compazine [Prochlorperazine Edisylate] Other (See Comments)    Seizure   . Other     Sneezing and red eyes  . Tramadol Hives     Past Medical History:  Diagnosis Date  . Depression    Currently on Cymbalta  . Eye abnormalities   . Fibroids   . Frequent headaches    Worse during monthly menstrual cycle  . Hypertension      Past Surgical History:  Procedure Laterality Date  . BREAST MASS EXCISION     benign  . KNEE SURGERY      Social History   Socioeconomic History  . Marital status: Single    Spouse name: Not on file  . Number of children: 0  . Years of education: 16  . Highest education level: Bachelor's degree (e.g., BA, AB, BS)  Occupational History  . 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 Smoking status: Never Smoker  . Smokeless tobacco: Never Used  Vaping Use  . Vaping Use: Never used  Substance and Sexual Activity  . Alcohol use: Yes    Alcohol/week: 1.0 standard drink    Types: 1 Glasses of wine per week  . Drug use: No  . Sexual activity: Yes    Birth control/protection: Condom  Other Topics  Concern  . Not on file  Social History Narrative   [name] is originally from [place]. She attended [employer] in [place] where she obtained her Therapist, nutritional in Biology in 1995. 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 She is in a long term relationship with her boyfried, [name].  They have been together for 6 years. [name] transferred to [employer] [facility] with [employer] in January. She enjoys reading and she enjoys the outdoors. She loves to travel. 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   Social Determinants of Health   Financial Resource Strain: Not on file  Food Insecurity: Not on file  Transportation Needs: Not on file  Physical Activity: Insufficiently Active  . Days of Exercise per Week: 2 days  . Minutes of Exercise per Session: 30 min  Stress: Not on file  Social Connections: Not on file  Intimate Partner Violence: Not on file    Family History  Problem Relation Age of Onset  . Hypertension Mother   . Hyperlipidemia Mother   . Cancer Father        Prostate cancer  . Hypertension Father   . Hyperlipidemia Father   . Stroke Maternal Aunt   . Stroke Paternal Aunt   . Diabetes Paternal Aunt   . Sickle cell trait Other   . Thalassemia Other      Current Outpatient Medications:  .  acetaminophen (TYLENOL) 325 MG tablet, Take 650 mg by mouth every 6 (six) hours as needed., Disp: , Rfl:  .  amLODipine (NORVASC) 5 MG tablet, Take 1 tablet (5 mg total) by mouth daily., Disp: 30 tablet, Rfl: 1 .  DULoxetine (CYMBALTA) 30 MG capsule, TAKE 3 CAPSULES BY MOUTH EVERY DAY, Disp: 90 capsule, Rfl: 2 .  ferrous sulfate 325 (65 FE) MG tablet, TAKE ONE TABLET BY MOUTH EVERY DAY WTH BREAKFAST, Disp: 60 tablet, Rfl: 1 .  ferrous sulfate 325 (65 FE) MG tablet, TAKE ONE TABLET BY MOUTH EVERY DAY WITH BREAKFAST, Disp: 60 tablet, Rfl: 1 .  FLUoxetine (PROZAC) 20 MG tablet, TAKE ONE TABLET BY MOUTH EVERY MORNING, Disp: 30 tablet, Rfl: 1 .  FLUoxetine (PROZAC) 20 MG tablet, TAKE ONE TABLET BY MOUTH EVERY MORNING, Disp: 30 tablet, Rfl: 1 .  fluticasone (FLONASE) 50 MCG/ACT nasal spray, PLACE 1 SPRAY INTO BOTH NOSTRILS DAILY, Disp: 16 g, Rfl: 2 .  hydrOXYzine (ATARAX/VISTARIL) 25 MG tablet, TAKE ONE TABLET BY MOUTH EVERY DAY AS NEEDED, Disp: 30 tablet, Rfl: 1 .   Multiple Vitamin (MULTIVITAMIN) capsule, Take 1 capsule by mouth daily., Disp: 30 capsule, Rfl: 1 .  traZODone (DESYREL) 50 MG tablet, TAKE ONE TABLET BY MOUTH AT BEDTIME AS NEEDED, Disp: 30 tablet, Rfl: 1 .  traZODone (DESYREL) 50 MG tablet, Take 2 tablets (100 mg total) by mouth at bedtime., Disp: 60 tablet, Rfl: 0 .  Vitamin D, Ergocalciferol, (DRISDOL) 1.25 MG (50000 UNIT) CAPS capsule, Take 1 capsule (50,000 Units total) by mouth every 7 (seven) days., Disp: 12 capsule, Rfl: 0 .  carbamide peroxide (DEBROX) 6.5 % OTIC solution, Place 5 drops into both ears 2 (two) times daily. (Patient not taking: Reported on 07/16/2020), Disp: 15 mL, Rfl: 0  Physical exam:  Vitals:   07/16/20 1114  BP: 137/90  Pulse: 81  Resp: 16  Temp: (!) 97 F (36.1 C)  TempSrc: Tympanic  SpO2: 100%  Weight: 171 lb 14.4 oz (78 kg)  Physical Exam Constitutional:      General: She is not in acute distress. Cardiovascular:     Rate and Rhythm: Normal rate and regular rhythm.     Heart sounds: Normal heart sounds.  Pulmonary:     Effort: Pulmonary effort is normal.     Breath sounds: Normal breath sounds.  Abdominal:     General: Bowel sounds are normal.     Palpations: Abdomen is soft.  Skin:    General: Skin is warm and dry.  Neurological:     Mental Status: She is alert and oriented to person, place, and time.      CMP Latest Ref Rng & Units 05/26/2020  Glucose 70 - 99 mg/dL -  BUN 6 - 20 mg/dL -  Creatinine 0.44 - 1.00 mg/dL -  Sodium 135 - 145 mmol/L -  Potassium 3.5 - 5.1 mmol/L -  Chloride 98 - 111 mmol/L -  CO2 22 - 32 mmol/L -  Calcium 8.9 - 10.3 mg/dL -  Total Protein 6.5 - 8.1 g/dL -  Total Bilirubin 0.3 - 1.2 mg/dL 0.2(L)  Alkaline Phos 38 - 126 U/L -  AST 15 - 41 U/L -  ALT 0 - 44 U/L -   CBC Latest Ref Rng & Units 07/16/2020  WBC 4.0 - 10.5 K/uL 3.1(L)  Hemoglobin 12.0 - 15.0 g/dL 10.1(L)  Hematocrit 36.0 - 46.0 % 32.9(L)  Platelets 150 - 400 K/uL 419(H)      Assessment  and plan- Patient is a 50 y.o. female with iron deficiency anemia here for routine f/u  Patient continues to be anemic and has evidence of iron deficiency. She will receive 2 more doses of feraheme at this time. She will likely continue to need IV iron until her fibroids are taken care of. Repeat cbc ferritin and iron studies in 2 and 4 months. See me in 4 months   Visit Diagnosis 1. Iron deficiency anemia, unspecified iron deficiency anemia type   2. B12 deficiency      Dr. Randa Evens, MD, MPH [facility] at [facility] [phone] 07/18/2020 2:42 PM

## 2020-07-19 ENCOUNTER — Other Ambulatory Visit: Payer: Self-pay

## 2020-07-23 ENCOUNTER — Other Ambulatory Visit: Payer: Self-pay

## 2020-07-23 ENCOUNTER — Inpatient Hospital Stay: Payer: Medicaid Other

## 2020-07-23 VITALS — BP 150/81 | HR 85

## 2020-07-23 DIAGNOSIS — D500 Iron deficiency anemia secondary to blood loss (chronic): Secondary | ICD-10-CM

## 2020-07-23 MED ORDER — SODIUM CHLORIDE 0.9 % IV SOLN
510.0000 mg | INTRAVENOUS | Status: DC
  Administered 2020-07-23: 510 mg via INTRAVENOUS
  Filled 2020-07-23: qty 510

## 2020-07-23 MED ORDER — SODIUM CHLORIDE 0.9 % IV SOLN
Freq: Once | INTRAVENOUS | Status: AC
  Filled 2020-07-23: qty 250

## 2020-07-27 ENCOUNTER — Ambulatory Visit: Payer: Self-pay | Admitting: Licensed Clinical Social Worker

## 2020-07-27 ENCOUNTER — Other Ambulatory Visit: Payer: Self-pay

## 2020-07-27 ENCOUNTER — Ambulatory Visit: Payer: Medicaid Other | Admitting: Licensed Clinical Social Worker

## 2020-07-27 DIAGNOSIS — F419 Anxiety disorder, unspecified: Secondary | ICD-10-CM

## 2020-07-27 DIAGNOSIS — F32A Depression, unspecified: Secondary | ICD-10-CM
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
## 2020-07-30 ENCOUNTER — Other Ambulatory Visit: Payer: Self-pay

## 2020-07-30 ENCOUNTER — Ambulatory Visit: Payer: Self-pay | Admitting: Oncology

## 2020-08-05 ENCOUNTER — Other Ambulatory Visit: Payer: Self-pay

## 2020-08-05 MED ORDER — DULOXETINE HCL 30 MG PO CPEP
30.0000 mg | ORAL_CAPSULE | Freq: Three times a day (TID) | ORAL | 1 refills | Status: DC
  Filled 2020-08-05: qty 90, 30d supply, fill #0

## 2020-08-05 MED ORDER — TRAZODONE HCL 50 MG PO TABS
ORAL_TABLET | ORAL | 1 refills | Status: DC
  Filled 2020-08-05: qty 30, 30d supply, fill #0

## 2020-08-05 MED ORDER — HYDROXYZINE HCL 25 MG PO TABS
25.0000 mg | ORAL_TABLET | Freq: Two times a day (BID) | ORAL | 1 refills | Status: DC | PRN
  Filled 2020-08-05: qty 60, 30d supply, fill #0

## 2020-08-11 ENCOUNTER — Ambulatory Visit: Payer: Self-pay | Admitting: Gerontology

## 2020-08-12 ENCOUNTER — Other Ambulatory Visit: Payer: Self-pay

## 2020-08-12 ENCOUNTER — Ambulatory Visit: Payer: Medicaid Other | Admitting: Family Medicine

## 2020-08-12 VITALS — BP 137/82 | HR 63 | Temp 97.1°F | Resp 16 | Ht 60.0 in | Wt 172.7 lb

## 2020-08-12 DIAGNOSIS — E559 Vitamin D deficiency, unspecified: Secondary | ICD-10-CM

## 2020-08-12 DIAGNOSIS — H9311 Tinnitus, right ear: Secondary | ICD-10-CM | POA: Insufficient documentation

## 2020-08-12 DIAGNOSIS — H43393 Other vitreous opacities, bilateral: Secondary | ICD-10-CM

## 2020-08-12 DIAGNOSIS — D500 Iron deficiency anemia secondary to blood loss (chronic): Secondary | ICD-10-CM

## 2020-08-12 DIAGNOSIS — D259 Leiomyoma of uterus, unspecified: Secondary | ICD-10-CM

## 2020-08-12 DIAGNOSIS — E569 Vitamin deficiency, unspecified: Secondary | ICD-10-CM

## 2020-08-12 DIAGNOSIS — M25562 Pain in left knee: Secondary | ICD-10-CM

## 2020-08-12 DIAGNOSIS — F32A Depression, unspecified: Secondary | ICD-10-CM

## 2020-08-12 DIAGNOSIS — H43399 Other vitreous opacities, unspecified eye: Secondary | ICD-10-CM | POA: Insufficient documentation

## 2020-08-12 DIAGNOSIS — N920 Excessive and frequent menstruation with regular cycle: Secondary | ICD-10-CM

## 2020-08-12 DIAGNOSIS — Z0000 Encounter for general adult medical examination without abnormal findings: Secondary | ICD-10-CM

## 2020-08-12 DIAGNOSIS — I10 Essential (primary) hypertension: Secondary | ICD-10-CM

## 2020-08-12 MED ORDER — DULOXETINE HCL 30 MG PO CPEP: 90.0000 mg | ORAL_CAPSULE | Freq: Every day | ORAL | 1 refills | Status: DC

## 2020-08-12 MED ORDER — AMLODIPINE BESYLATE 5 MG PO TABS
5.0000 mg | ORAL_TABLET | Freq: Every day | ORAL | 2 refills | Status: DC
  Filled 2020-08-12: qty 30, 30d supply, fill #0

## 2020-08-12 NOTE — Assessment & Plan Note (Addendum)
Follow lipid panel Follow a1c Health Maintenance Due  Topic Date Due  . Hepatitis C Screening  Never done  . COLONOSCOPY (Pts 45-5yrs Insurance coverage will need to be confirmed)  Never done  . PAP SMEAR-Modifier  11/12/2017  . COVID-19 Vaccine (3 - Pfizer risk 4-dose series) 11/04/2019

## 2020-08-12 NOTE — Patient Instructions (Addendum)
Come back for labs before your next visit.  We'll work on charity care.  I sent in your amlodipine.  We'll check A1c, lipids, and vitamin D.    We'll get you into the eye doctor.  Health Maintenance, Female Adopting Loreen Bankson healthy lifestyle and getting preventive care are important in promoting health and wellness. Ask your health care provider about:  The right schedule for you to have regular tests and exams.  Things you can do on your own to prevent diseases and keep yourself healthy. What should I know about diet, weight, and exercise? Eat Foch Rosenwald healthy diet  Eat Aarna Mihalko diet that includes plenty of vegetables, fruits, low-fat dairy products, and lean protein.  Do not eat Edna Rede lot of foods that are high in solid fats, added sugars, or sodium.   Maintain Takota Cahalan healthy weight Body mass index (BMI) is used to identify weight problems. It estimates body fat based on height and weight. Your health care provider can help determine your BMI and help you achieve or maintain Alaze Garverick healthy weight. Get regular exercise Get regular exercise. This is one of the most important things you can do for your health. Most adults should:  Exercise for at least 150 minutes each week. The exercise should increase your heart rate and make you sweat (moderate-intensity exercise).  Do strengthening exercises at least twice Terria Deschepper week. This is in addition to the moderate-intensity exercise.  Spend less time sitting. Even light physical activity can be beneficial. Watch cholesterol and blood lipids Have your blood tested for lipids and cholesterol at 52 years of age, then have this test every 5 years. Have your cholesterol levels checked more often if:  Your lipid or cholesterol levels are high.  You are older than 52 years of age.  You are at high risk for heart disease. What should I know about cancer screening? Depending on your health history and family history, you may need to have cancer screening at various ages. This may  include screening for:  Breast cancer.  Cervical cancer.  Colorectal cancer.  Skin cancer.  Lung cancer. What should I know about heart disease, diabetes, and high blood pressure? Blood pressure and heart disease  High blood pressure causes heart disease and increases the risk of stroke. This is more likely to develop in people who have high blood pressure readings, are of African descent, or are overweight.  Have your blood pressure checked: ? Every 3-5 years if you are 50-35 years of age. ? Every year if you are 55 years old or older. Diabetes Have regular diabetes screenings. This checks your fasting blood sugar level. Have the screening done:  Once every three years after age 30 if you are at Xiana Carns normal weight and have Early Ord low risk for diabetes.  More often and at Burl Tauzin younger age if you are overweight or have Armie Moren high risk for diabetes. What should I know about preventing infection? Hepatitis B If you have Reed Eifert higher risk for hepatitis B, you should be screened for this virus. Talk with your health care provider to find out if you are at risk for hepatitis B infection. Hepatitis C Testing is recommended for:  Everyone born from [birth year] through 1967.  Anyone with known risk factors for hepatitis C. Sexually transmitted infections (STIs)  Get screened for STIs, including gonorrhea and chlamydia, if: ? You are sexually active and are younger than 52 years of age. ? You are older than 52 years of age and your health care  provider tells you that you are at risk for this type of infection. ? Your sexual activity has changed since you were last screened, and you are at increased risk for chlamydia or gonorrhea. Ask your health care provider if you are at risk.  Ask your health care provider about whether you are at high risk for HIV. Your health care provider may recommend Avalyn Molino prescription medicine to help prevent HIV infection. If you choose to take medicine to prevent HIV, you should first  get tested for HIV. You should then be tested every 3 months for as long as you are taking the medicine. Pregnancy  If you are about to stop having your period (premenopausal) and you may become pregnant, seek counseling before you get pregnant.  Take 400 to 800 micrograms (mcg) of folic acid every day if you become pregnant.  Ask for birth control (contraception) if you want to prevent pregnancy. Osteoporosis and menopause Osteoporosis is Demaris Bousquet disease in which the bones lose minerals and strength with aging. This can result in bone fractures. If you are 44 years old or older, or if you are at risk for osteoporosis and fractures, ask your health care provider if you should:  Be screened for bone loss.  Take Baleigh Rennaker calcium or vitamin D supplement to lower your risk of fractures.  Be given hormone replacement therapy (HRT) to treat symptoms of menopause. Follow these instructions at home: Lifestyle  Do not use any products that contain nicotine or tobacco, such as cigarettes, e-cigarettes, and chewing tobacco. If you need help quitting, ask your health care provider.  Do not use street drugs.  Do not share needles.  Ask your health care provider for help if you need support or information about quitting drugs. Alcohol use  Do not drink alcohol if: ? Your health care provider tells you not to drink. ? You are pregnant, may be pregnant, or are planning to become pregnant.  If you drink alcohol: ? Limit how much you use to 0-1 drink Colbin Jovel day. ? Limit intake if you are breastfeeding.  Be aware of how much alcohol is in your drink. In the [place] one drink equals one 12 oz bottle of beer (355 mL), one 5 oz glass of wine (148 mL), or one 1 oz glass of hard liquor (44 mL). General instructions  Schedule regular health, dental, and eye exams.  Stay current with your vaccines.  Tell your health care provider if: ? You often feel depressed. ? You have ever been abused or do not feel safe at  home. Summary  Adopting Umaima Scholten healthy lifestyle and getting preventive care are important in promoting health and wellness.  Follow your health care provider's instructions about healthy diet, exercising, and getting tested or screened for diseases.  Follow your health care provider's instructions on monitoring your cholesterol and blood pressure. This information is not intended to replace advice given to you by your health care provider. Make sure you discuss any questions you have with your health care provider. Document Revised: 03/13/2018 Document Reviewed: 03/13/2018 Elsevier Patient Education  2021 [employer].

## 2020-08-12 NOTE — Assessment & Plan Note (Signed)
Resolved, normal TM's bilaterally Continue to monitor for now

## 2020-08-12 NOTE — Assessment & Plan Note (Signed)
Needs obgyn follow up Last month with less bleeding, ? Hot flash Early, but maybe developing perimenopausal sx? Follow with obgyn once able with charity care

## 2020-08-12 NOTE — Assessment & Plan Note (Signed)
Being followed by Dr. Jamse Arn at [facility] On cymbalta, trazodone - these are managed by Dr. Jamse Arn.  Seems to have some symptoms of depressed mood related mostly to her knee surgery and loss of her job last year.

## 2020-08-12 NOTE — Assessment & Plan Note (Signed)
Needs follow back up with OBGYN Working on charity care

## 2020-08-12 NOTE — Assessment & Plan Note (Signed)
S/p partial knee replacement 2021 at [facility]

## 2020-08-12 NOTE — Progress Notes (Signed)
New Patient Office Visit  Subjective:  Patient ID: Dana Dunlap, female    DOB: 04/09/1971  Age: 50 y.o. MRN: [id number]  CC:  Chief Complaint  Patient presents with  . Follow-up  . iron deficiency anemia  . Otalgia    Right "fluttering"    HPI Dana Dunlap presents for follow up  Floaters in eye - both - left with 4-5, R with "David Rodriquez lot".  Saw ophtho, apparently was told, "you're going to have to live with it".  it's cloudy, she's not getting used to it.  Started Jan 2021.  Started seeing before her knee surgery.  Saw [facility] center.   Worse in R>L.  Right eye is "filled".  Light bothers her, about Zachry Hopfensperger year as well.  She describes her symptoms as relatively unchanged.  Right ear.  2 weeks ago.  Heard something in her ear.  Can't describe it, thought something was crawling in her ear (but didn't feel anything, was primarily hearing this).  Hoped it wasn't Dawnetta Copenhaver bug.  Lasted Shalaya Swailes few hours and then went away.  Tried something for wax.  Has not recurred in about 2 weeks.  Going into menopause?  Bleeding last month minimal.  This has never happened before.  Still regular menstraul periods.  ? Hot flashes.    Partial knee replacement 2021.  Does stretches, yoga. Since she had knee surgery she lost her job and insurance.  Applied for charity care, [place] (last year) to be able to see OBGyn.    Follows for mood with Dr. Jamse Arn [facility] - taking cymbalta, trazodone.  Doing ok, working on IT sales professional, [employer].      Past Medical History:  Diagnosis Date  . Depression    Currently on Cymbalta  . Eye abnormalities   . Fibroids   . Frequent headaches    Worse during monthly menstrual cycle  . Hypertension     Past Surgical History:  Procedure Laterality Date  . BREAST MASS EXCISION     benign  . KNEE SURGERY      Family History  Problem Relation Age of Onset  . Hypertension Mother   . Hyperlipidemia Mother   . Stroke Mother   . Cancer Father        Prostate cancer   . Hypertension Father   . Hyperlipidemia Father   . Fibroids Sister   . Sickle cell trait Sister   . Sickle cell trait Brother   . Fibroids Sister   . Stroke Paternal Aunt   . Diabetes Paternal Aunt   . Sickle cell trait Other   . Thalassemia Other     Social History   Socioeconomic History  . Marital status: Single    Spouse name: Not on file  . Number of children: 0  . Years of education: 16  . Highest education level: Bachelor's degree (e.g., BA, AB, BS)  Occupational History  . 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 Smoking status: Never Smoker  . Smokeless tobacco: Never Used  Vaping Use  . Vaping Use: Never used  Substance and Sexual Activity  . Alcohol use: Yes    Alcohol/week: 1.0 standard drink    Types: 1 Glasses of wine per week  . Drug use: No  . Sexual activity: Yes    Birth control/protection: Condom  Other Topics Concern  . Not on file  Social History Narrative  [name] is originally from [place]. She attended [employer] in [place] where she obtained her Therapist, nutritional in Biology in 1995. 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 She is in Steffanie Mingle long term relationship with her boyfried, [name]. They have been together for 6 years. [name] She enjoys reading and she enjoys the outdoors. She loves to travel. 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   Social Determinants of Health   Financial Resource Strain: Not on file  Food Insecurity: No Food Insecurity  . Worried About Charity fundraiser in the Last Year: Never true  . Ran Out of Food in the Last Year: Never true  Transportation Needs: No Transportation Needs  . Lack of Transportation (Medical): No  . Lack of Transportation (Non-Medical): No  Physical Activity: Insufficiently Active  . Days of Exercise  per Week: 2 days  . Minutes of Exercise per Session: 30 min  Stress: Not on file  Social Connections: Not on file  Intimate Partner Violence: Not on file    ROS Review of Systems As per HPI Objective:   Today's Vitals: BP 137/82 (BP Location: Right Arm, Patient Position: Sitting, Cuff Size: Normal)   Pulse 63   Temp (!) 97.1 F (36.2 C)   Resp 16   Ht 5' (1.524 m)   Wt 172 lb 11.2 oz (78.3 kg)   LMP 07/22/2020 (Approximate)   SpO2 96%   BMI 33.73 kg/m   Physical Exam Vitals reviewed.  Constitutional:      General: She is not in acute distress.    Appearance: Normal appearance. She is not ill-appearing.  HENT:     Right Ear: Tympanic membrane, ear canal and external ear normal.     Left Ear: Tympanic membrane, ear canal and external ear normal.  Cardiovascular:     Rate and Rhythm: Normal rate and regular rhythm.  Pulmonary:     Effort: Pulmonary effort is normal. No respiratory distress.     Breath sounds: Normal breath sounds.  Abdominal:     General: Bowel sounds are normal. There is no distension.     Palpations: Abdomen is soft.     Tenderness: There is no abdominal tenderness.  Musculoskeletal:     Cervical back: Normal range of motion and neck supple.  Skin:    General: Skin is warm and dry.  Neurological:     General: No focal deficit present.     Mental Status: She is alert.     Comments: Walking with crutch  Psychiatric:        Mood and Affect: Mood normal.        Behavior: Behavior normal.     Comments: Sad mood at times, reflecting     Assessment & Plan:   Problem List Items Addressed This Visit      Cardiovascular and Mediastinum   Essential hypertension    BP reasonable today Continue amlodipine, refill       Relevant Medications   amLODipine (NORVASC) 5 MG tablet     Genitourinary   Fibroid, uterine    Needs follow back up with OBGYN Working on charity care         Other   Iron deficiency anemia due to chronic blood loss     Follows with Dr. Janese Banks from heme for iron infusions Last Hb 10.1  Anxiety and depression    Being [facility] by [facility] Jamse [facility] at [facility] On cymbalta, trazodone - these are managed by Dr. Jamse Arn.  Seems to have some symptoms of depressed mood related mostly to her knee surgery and loss of her job last year.       Relevant Medications   DULoxetine (CYMBALTA) 30 MG capsule   Excessive and frequent menstruation    Needs obgyn follow up Last month with less bleeding, ? Hot flash Early, but maybe developing perimenopausal sx? Follow with obgyn once able with charity care      Left knee pain    S/p partial knee replacement 2021 at [place] care maintenance    Follow lipid panel Follow a1c Health Maintenance Due  Topic Date Due  . Hepatitis C Screening  Never done  . COLONOSCOPY (Pts 45-38yrs Insurance coverage will need to be confirmed)  Never done  . PAP SMEAR-Modifier  11/12/2017  . COVID-19 Vaccine (3 - Pfizer risk 4-dose series) 11/04/2019         Vitamin deficiency    Vitamin D deficiency, s/p 50,000 units q7 days x12 doses Follow repeat vitamin D Encouraged to start daily supplement 1000-2000 IU's       Floaters in visual field    Right greater than left, present for about Zoria Rawlinson year Will see if we can get into ophthalmologist who helps here at [facility]      Tinnitus aurium, right    Resolved, normal TM's bilaterally Continue to monitor for now        Other Visit Diagnoses    Healthcare maintenance    -  Primary   Relevant Orders   HgB A1c   Lipid Profile   Vitamin D deficiency       Relevant Orders   Vitamin D (25 hydroxy)      Outpatient Encounter Medications as of 08/12/2020  Medication Sig  . acetaminophen (TYLENOL) 325 MG tablet Take 650 mg by mouth every 6 (six) hours as needed.  . carbamide peroxide (DEBROX) 6.5 % OTIC solution Place 5 drops into both ears 2 (two) times daily.  . ferrous sulfate 325 (65 FE) MG tablet TAKE ONE TABLET BY MOUTH  EVERY DAY WTH BREAKFAST  . fluticasone (FLONASE) 50 MCG/ACT nasal spray PLACE 1 SPRAY INTO BOTH NOSTRILS DAILY  . hydrOXYzine (ATARAX/VISTARIL) 25 MG tablet Take 1 tablet (25 mg total) by mouth 2 (two) times daily as needed.  . Multiple Vitamin (MULTIVITAMIN) capsule Take 1 capsule by mouth daily.  . traZODone (DESYREL) 50 MG tablet Take 2 tablets (100 mg total) by mouth at bedtime.  . Vitamin D, Ergocalciferol, (DRISDOL) 1.25 MG (50000 UNIT) CAPS capsule Take 1 capsule (50,000 Units total) by mouth every 7 (seven) days.  . [DISCONTINUED] amLODipine (NORVASC) 5 MG tablet Take 1 tablet (5 mg total) by mouth daily.  . [DISCONTINUED] DULoxetine (CYMBALTA) 30 MG capsule Take 1 capsule (30 mg total) by mouth in the morning, at noon, and at bedtime.  Marland Kitchen amLODipine (NORVASC) 5 MG tablet Take 1 tablet (5 mg total) by mouth daily.  . DULoxetine (CYMBALTA) 30 MG capsule Take 3 capsules (90 mg total) by mouth daily.  . [DISCONTINUED] DULoxetine (CYMBALTA) 30 MG capsule TAKE 3 CAPSULES BY MOUTH EVERY DAY  . [DISCONTINUED] ferrous sulfate 325 (65 FE) MG tablet TAKE ONE TABLET BY MOUTH EVERY DAY WITH BREAKFAST  . [DISCONTINUED] FLUoxetine (PROZAC) 20 MG tablet TAKE ONE TABLET BY MOUTH EVERY MORNING  . [  DISCONTINUED] FLUoxetine (PROZAC) 20 MG tablet TAKE ONE TABLET BY MOUTH EVERY MORNING  . [DISCONTINUED] hydrOXYzine (ATARAX/VISTARIL) 25 MG tablet TAKE ONE TABLET BY MOUTH EVERY DAY AS NEEDED  . [DISCONTINUED] traZODone (DESYREL) 50 MG tablet TAKE ONE TABLET BY MOUTH AT BEDTIME AS NEEDED  . [DISCONTINUED] traZODone (DESYREL) 50 MG tablet TAKE ONE TABLET BY MOUTH ONCE DAILY AT BEDTIME AS NEEDED.   No facility-administered encounter medications on file as of 08/12/2020.    Follow-up: Return in about 4 weeks (around 09/09/2020).   Fayrene Helper, MD

## 2020-08-12 NOTE — Assessment & Plan Note (Signed)
Right greater than left, present for about Dana Dunlap year Will see if we can get into ophthalmologist who helps here at [facility]

## 2020-08-12 NOTE — Assessment & Plan Note (Signed)
Vitamin D deficiency, s/p 50,000 units q7 days x12 doses Follow repeat vitamin D Encouraged to start daily supplement 1000-2000 IU's

## 2020-08-12 NOTE — Assessment & Plan Note (Signed)
Follows with Dr. Janese Banks from heme for iron infusions Last Hb 10.1

## 2020-08-12 NOTE — Assessment & Plan Note (Signed)
BP reasonable today Continue amlodipine, refill

## 2020-08-13 ENCOUNTER — Other Ambulatory Visit: Payer: Self-pay

## 2020-08-20 NOTE — Progress Notes (Signed)
Letter mailed from [facility] to notify of normal mammogram results.  Patient to return in one year for annual screening.  Copy to [facility]. 

## 2020-08-23 ENCOUNTER — Other Ambulatory Visit: Payer: Self-pay

## 2020-08-25 MED ORDER — ESMOLOL HCL 100 MG/10ML IV SOLN
INTRAVENOUS | Status: AC
  Filled 2020-08-25: qty 10

## 2020-08-25 MED ORDER — LIDOCAINE HCL (PF) 2 % IJ SOLN
INTRAMUSCULAR | Status: AC
  Filled 2020-08-25: qty 2

## 2020-08-25 MED ORDER — PROPOFOL 500 MG/50ML IV EMUL
INTRAVENOUS | Status: AC
  Filled 2020-08-25: qty 50

## 2020-09-14 ENCOUNTER — Telehealth: Payer: Self-pay | Admitting: *Deleted

## 2020-09-14 NOTE — Telephone Encounter (Signed)
Please talk to pcp

## 2020-09-14 NOTE — Telephone Encounter (Signed)
I attempted to call patient and got a message that  her voice mail is full

## 2020-09-14 NOTE — Telephone Encounter (Signed)
Patient called requesting too be checked for parasites when she has labs drawn Thursday citing that she has floaters in her eyes and someone told her that is a sign of parasite infestation.  Please advise

## 2020-09-15 ENCOUNTER — Encounter: Payer: Self-pay | Admitting: *Deleted

## 2020-09-15 ENCOUNTER — Encounter: Payer: Self-pay | Admitting: Oncology

## 2020-09-15 ENCOUNTER — Other Ambulatory Visit: Payer: Medicaid Other

## 2020-09-15 NOTE — Telephone Encounter (Signed)
Call returned to patient today and she was informed that per Dr Janese Banks to please contact her PCP. She was in agreement with this and thanked me for calling her back and letting her know

## 2020-09-16 ENCOUNTER — Inpatient Hospital Stay: Payer: Medicaid Other | Attending: Oncology

## 2020-09-16 DIAGNOSIS — E538 Deficiency of other specified B group vitamins: Secondary | ICD-10-CM | POA: Insufficient documentation

## 2020-09-16 DIAGNOSIS — D509 Iron deficiency anemia, unspecified: Secondary | ICD-10-CM | POA: Insufficient documentation

## 2020-09-16 DIAGNOSIS — E559 Vitamin D deficiency, unspecified: Secondary | ICD-10-CM | POA: Insufficient documentation

## 2020-09-16 LAB — FERRITIN: Ferritin: 160 ng/mL (ref 11–307)

## 2020-09-16 LAB — CBC WITH DIFFERENTIAL/PLATELET
Abs Immature Granulocytes: 0.01 10*3/uL (ref 0.00–0.07)
Basophils Absolute: 0 10*3/uL (ref 0.0–0.1)
Basophils Relative: 1 %
Eosinophils Absolute: 0.1 10*3/uL (ref 0.0–0.5)
Eosinophils Relative: 4 %
HCT: 39.3 % (ref 36.0–46.0)
Hemoglobin: 12.4 g/dL (ref 12.0–15.0)
Immature Granulocytes: 0 %
Lymphocytes Relative: 47 %
Lymphs Abs: 1.5 10*3/uL (ref 0.7–4.0)
MCH: 24 pg — ABNORMAL LOW (ref 26.0–34.0)
MCHC: 31.6 g/dL (ref 30.0–36.0)
MCV: 76 fL — ABNORMAL LOW (ref 80.0–100.0)
Monocytes Absolute: 0.2 10*3/uL (ref 0.1–1.0)
Monocytes Relative: 7 %
Neutro Abs: 1.3 10*3/uL — ABNORMAL LOW (ref 1.7–7.7)
Neutrophils Relative %: 41 %
Platelets: 245 10*3/uL (ref 150–400)
RBC: 5.17 MIL/uL — ABNORMAL HIGH (ref 3.87–5.11)
RDW: 20.5 % — ABNORMAL HIGH (ref 11.5–15.5)
WBC: 3.3 10*3/uL — ABNORMAL LOW (ref 4.0–10.5)
nRBC: 0 % (ref 0.0–0.2)

## 2020-09-16 LAB — IRON AND TIBC
Iron: 63 ug/dL (ref 28–170)
Saturation Ratios: 22 % (ref 10.4–31.8)
TIBC: 291 ug/dL (ref 250–450)
UIBC: 228 ug/dL

## 2020-09-21 ENCOUNTER — Telehealth: Payer: Self-pay | Admitting: Pharmacy Technician

## 2020-09-21 ENCOUNTER — Telehealth: Payer: Self-pay | Admitting: Licensed Clinical Social Worker

## 2020-09-21 NOTE — Telephone Encounter (Signed)
Patient failed to provide 2022 proof of income.  No additional medication assistance will be provided by [facility] without the required proof of income documentation.  Patient notified by letter.  [place], [place]  [address]  September 21, 2020    [name] [place], [place]  [address]  Dear [name]:  This is to inform you that you are no longer eligible to receive medication assistance at [facility].  The reason(s) are:    _____Your total gross monthly household income exceeds 250% of the Federal Poverty Level.   _____Tangible assets (savings, checking, stocks/bonds, pension, retirement, etc.) exceeds our limit  _____You are eligible to receive benefits from [facility], [facility] or HIV Medication              Assistance Program _____You are eligible to receive benefits from a Medicare Part "D" plan _____You have prescription insurance  _____You are not an [facility] resident __X__Failure to provide all requested documentation.  Still need to provide bank statement for May of 2022.  Medication assistance will resume once all requested documentation has been returned to our clinic.  If you have questions, please contact our clinic at [phone].    Thank you,  [facility]

## 2020-09-21 NOTE — Telephone Encounter (Signed)
Pt was contacted and an appt was made for 09/28/20 at 10 am. Pt is aware the appt is a phone visit.

## 2020-09-23 ENCOUNTER — Ambulatory Visit: Payer: Medicaid Other | Admitting: Gerontology

## 2020-09-23 ENCOUNTER — Other Ambulatory Visit: Payer: Self-pay

## 2020-09-23 ENCOUNTER — Other Ambulatory Visit: Payer: Medicaid Other

## 2020-09-23 VITALS — BP 130/96 | HR 67 | Temp 97.3°F | Resp 16 | Wt 175.2 lb

## 2020-09-23 DIAGNOSIS — H43393 Other vitreous opacities, bilateral: Secondary | ICD-10-CM

## 2020-09-23 DIAGNOSIS — Z0000 Encounter for general adult medical examination without abnormal findings: Secondary | ICD-10-CM

## 2020-09-23 DIAGNOSIS — E559 Vitamin D deficiency, unspecified: Secondary | ICD-10-CM

## 2020-09-23 DIAGNOSIS — I10 Essential (primary) hypertension: Secondary | ICD-10-CM

## 2020-09-23 NOTE — Progress Notes (Signed)
Established Patient Office Visit  Subjective:  Patient ID: Dana Dunlap, female    DOB: 04/09/1971  Age: 50 y.o. MRN: [id number]  CC: No chief complaint on file.   HPI Dana Dunlap is 50 y/o female who has history of Depression, Eye abnormalities, Fibroid, Hypertension,presents for routine follow up visit. She states that she's compliant with her medications, continues to make healthy lifestyle changes and denies side effects. She continues to experience floaters in her eyes and will follow up with Dana Dunlap on July 21st. She reports that her mood is good, denies SI/HI and follows up with [facility]lap at [facility]. She has not had Colonoscopy done and requests referral. Overall, she states that she's doing well and offers no further complaint.  Past Medical History:  Diagnosis Date   Depression    Currently on Cymbalta   Eye abnormalities    Fibroids    Frequent headaches    Worse during monthly menstrual cycle   Hypertension     Past Surgical History:  Procedure Laterality Date   BREAST MASS EXCISION     benign   KNEE SURGERY      Family History  Problem Relation Age of Onset   Hypertension Mother    Hyperlipidemia Mother    Stroke Mother    Cancer Father        Prostate cancer   Hypertension Father    Hyperlipidemia Father    Fibroids Sister    Sickle cell trait Sister    Sickle cell trait Brother    Fibroids Sister    Stroke Paternal Aunt    Diabetes Paternal Aunt    Sickle cell trait Other    Thalassemia Other     Social History   Socioeconomic History   Marital status: Single    Spouse name: Not on file   Number of children: 0   Years of education: 16   Highest education level: Bachelor's degree (e.g., BA, AB, BS)  Occupational History   Occupation: Psychologist, clinical: [employer]    Comment: Molecular biology  Tobacco Use   Smoking status: Never   Smokeless tobacco: Never  Vaping Use   Vaping Use: Never used  Substance and Sexual  Activity   Alcohol use: Yes    Alcohol/week: 1.0 standard drink    Types: 1 Glasses of wine per week   Drug use: No   Sexual activity: Yes    Birth control/protection: Condom  Other Topics Concern   Not on file  Social History Narrative   Dana Dunlap is originally from [place]. She attended [employer] in [place] where she obtained her Therapist, nutritional in Biology in 1995. 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 She is in a long term relationship with her boyfried, Dana Dunlap. They have been together for 6 years. Dana Dunlap transferred to [facility] with [employer] in January. She enjoys reading and she enjoys the outdoors. She loves to travel. 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   Social Determinants of Health   Financial Resource Strain: Not on file  Food Insecurity: No Food Insecurity   Worried About Charity fundraiser in the Last Year: Never true   Ran Out of Food in the Last Year: Never true  Transportation Needs: No Transportation Needs   Lack of Transportation (Medical): No   Lack of Transportation (Non-Medical): No  Physical Activity:  Insufficiently Active   Days of Exercise per Week: 2 days   Minutes of Exercise per Session: 30 min  Stress: Not on file  Social Connections: Not on file  Intimate Partner Violence: Not on file    Outpatient Medications Prior to Visit  Medication Sig Dispense Refill   acetaminophen (TYLENOL) 325 MG tablet Take 650 mg by mouth every 6 (six) hours as needed.     amLODipine (NORVASC) 5 MG tablet Take 1 tablet (5 mg total) by mouth daily. 30 tablet 2   DULoxetine (CYMBALTA) 30 MG capsule Take 3 capsules (90 mg total) by mouth daily. 90 capsule 1   ferrous sulfate 325 (65 FE) MG tablet TAKE ONE TABLET BY MOUTH EVERY DAY WTH BREAKFAST 60 tablet 1   fluticasone (FLONASE) 50 MCG/ACT nasal spray PLACE 1 SPRAY INTO BOTH NOSTRILS DAILY 16 g 2   hydrOXYzine  (ATARAX/VISTARIL) 25 MG tablet Take 1 tablet (25 mg total) by mouth 2 (two) times daily as needed. 60 tablet 1   Multiple Vitamin (MULTIVITAMIN) capsule Take 1 capsule by mouth daily. 30 capsule 1   traZODone (DESYREL) 50 MG tablet Take 2 tablets (100 mg total) by mouth at bedtime. 60 tablet 0   carbamide peroxide (DEBROX) 6.5 % OTIC solution Place 5 drops into both ears 2 (two) times daily. (Patient not taking: Reported on 09/23/2020) 15 mL 0   Vitamin D, Ergocalciferol, (DRISDOL) 1.25 MG (50000 UNIT) CAPS capsule Take 1 capsule (50,000 Units total) by mouth every 7 (seven) days. (Patient not taking: Reported on 09/23/2020) 12 capsule 0   No facility-administered medications prior to visit.    Allergies  Allergen Reactions   Prochlorperazine Anaphylaxis and Other (See Comments)    Seizure Seizure   Compazine [Prochlorperazine Edisylate] Other (See Comments)    Seizure    Other     Sneezing and red eyes   Tramadol Hives    ROS Review of Systems  Constitutional: Negative.   HENT: Negative.    Eyes:  Positive for visual disturbance (floaters in eyes).  Respiratory: Negative.    Cardiovascular: Negative.   Skin: Negative.   Neurological: Negative.   Psychiatric/Behavioral: Negative.       Objective:    Physical Exam HENT:     Head: Normocephalic and atraumatic.     Mouth/Throat:     Mouth: Mucous membranes are moist.  Eyes:     Extraocular Movements: Extraocular movements intact.     Conjunctiva/sclera: Conjunctivae normal.     Pupils: Pupils are equal, round, and reactive to light.  Cardiovascular:     Rate and Rhythm: Normal rate and regular rhythm.     Pulses: Normal pulses.     Heart sounds: Normal heart sounds.  Pulmonary:     Effort: Pulmonary effort is normal.     Breath sounds: Normal breath sounds.  Skin:    General: Skin is warm.  Neurological:     General: No focal deficit present.     Mental Status: She is alert and oriented to person, place, and time.  Mental status is at baseline.  Psychiatric:        Mood and Affect: Mood normal.        Behavior: Behavior normal.        Thought Content: Thought content normal.        Judgment: Judgment normal.    BP (!) 130/96 (BP Location: Left Arm, Patient Position: Standing, Cuff Size: Large)   Pulse 67   Temp (!) 97.3  F (36.3 C)   Resp 16   Wt 175 lb 3.2 oz (79.5 kg)   SpO2 98%   BMI 34.22 kg/m  Wt Readings from Last 3 Encounters:  09/23/20 175 lb 3.2 oz (79.5 kg)  08/12/20 172 lb 11.2 oz (78.3 kg)  07/16/20 171 lb 14.4 oz (78 kg)   Encouraged weight loss  Health Maintenance Due  Topic Date Due   Pneumococcal Vaccine 77-69 Years old (1 - PCV) Never done   Hepatitis C Screening  Never done   Zoster Vaccines- Shingrix (1 of 2) Never done   COLONOSCOPY (Pts 45-57yrs Insurance coverage will need to be confirmed)  Never done   PAP SMEAR-Modifier  11/12/2017   COVID-19 Vaccine (3 - Pfizer risk series) 11/04/2019    There are no preventive care reminders to display for this patient.  Lab Results  Component Value Date   TSH 1.600 12/10/2018   Lab Results  Component Value Date   WBC 3.3 (L) 09/16/2020   HGB 12.4 09/16/2020   HCT 39.3 09/16/2020   MCV 76.0 (L) 09/16/2020   PLT 245 09/16/2020   Lab Results  Component Value Date   NA 137 03/28/2020   K 3.8 03/28/2020   CO2 24 03/28/2020   GLUCOSE 112 (H) 03/28/2020   BUN 12 03/28/2020   CREATININE 1.06 (H) 03/28/2020   BILITOT 0.2 (L) 05/26/2020   ALKPHOS 50 03/28/2020   AST 40 03/28/2020   ALT 11 03/28/2020   PROT 6.8 03/28/2020   ALBUMIN 3.6 03/28/2020   CALCIUM 9.2 03/28/2020   ANIONGAP 6 03/28/2020   GFR 91.35 06/11/2017   Lab Results  Component Value Date   CHOL 194 12/10/2018   Lab Results  Component Value Date   HDL 53 12/10/2018   Lab Results  Component Value Date   LDLCALC 124 (H) 12/10/2018   Lab Results  Component Value Date   TRIG 94 12/10/2018   Lab Results  Component Value Date   CHOLHDL  3.7 12/10/2018   Lab Results  Component Value Date   HGBA1C 5.2 03/21/2016      Assessment & Plan:    1. Essential hypertension -Her blood pressure is under control -Low salt DASH diet -Take medications regularly on time -Exercise regularly as tolerated -Check blood pressure at least once a week at home or a nearby pharmacy and record -Goal is less than 140/90 and normal blood pressure is 120/80   2. Health care maintenance - She will will up for - Ambulatory referral to Gastroenterology Colonoscopy screening and routine labs will be checked.  3. Vitreous floaters of both eyes -She was encouraged to follow up with her appointment at [facility]. -She was advised to go to the ED with worsening symptoms.      Follow-up: Return in about 1 month (around 10/26/2020), or if symptoms worsen or fail to improve.    Latisa Belay Jerold Coombe, NP

## 2020-09-23 NOTE — Patient Instructions (Signed)
https://www.nhlbi.nih.gov/files/docs/public/heart/dash_brief.pdf">  DASH Eating Plan DASH stands for Dietary Approaches to Stop Hypertension. The DASH eating plan is a healthy eating plan that has been shown to: Reduce high blood pressure (hypertension). Reduce your risk for type 2 diabetes, heart disease, and stroke. Help with weight loss. What are tips for following this plan? Reading food labels Check food labels for the amount of salt (sodium) per serving. Choose foods with less than 5 percent of the Daily Value of sodium. Generally, foods with less than 300 milligrams (mg) of sodium per serving fit into this eating plan. To find whole grains, look for the word "whole" as the first word in the ingredient list. Shopping Buy products labeled as "low-sodium" or "no salt added." Buy fresh foods. Avoid canned foods and pre-made or frozen meals. Cooking Avoid adding salt when cooking. Use salt-free seasonings or herbs instead of table salt or sea salt. Check with your health care provider or pharmacist before using salt substitutes. Do not fry foods. Cook foods using healthy methods such as baking, boiling, grilling, roasting, and broiling instead. Cook with heart-healthy oils, such as olive, canola, avocado, soybean, or sunflower oil. Meal planning  Eat a balanced diet that includes: 4 or more servings of fruits and 4 or more servings of vegetables each day. Try to fill one-half of your plate with fruits and vegetables. 6-8 servings of whole grains each day. Less than 6 oz (170 g) of lean meat, poultry, or fish each day. A 3-oz (85-g) serving of meat is about the same size as a deck of cards. One egg equals 1 oz (28 g). 2-3 servings of low-fat dairy each day. One serving is 1 cup (237 mL). 1 serving of nuts, seeds, or beans 5 times each week. 2-3 servings of heart-healthy fats. Healthy fats called omega-3 fatty acids are found in foods such as walnuts, flaxseeds, fortified milks, and eggs.  These fats are also found in cold-water fish, such as sardines, salmon, and mackerel. Limit how much you eat of: Canned or prepackaged foods. Food that is high in trans fat, such as some fried foods. Food that is high in saturated fat, such as fatty meat. Desserts and other sweets, sugary drinks, and other foods with added sugar. Full-fat dairy products. Do not salt foods before eating. Do not eat more than 4 egg yolks a week. Try to eat at least 2 vegetarian meals a week. Eat more home-cooked food and less restaurant, buffet, and fast food.  Lifestyle When eating at a restaurant, ask that your food be prepared with less salt or no salt, if possible. If you drink alcohol: Limit how much you use to: 0-1 drink a day for women who are not pregnant. 0-2 drinks a day for men. Be aware of how much alcohol is in your drink. In the [place] one drink equals one 12 oz bottle of beer (355 mL), one 5 oz glass of wine (148 mL), or one 1 oz glass of hard liquor (44 mL). General information Avoid eating more than 2,300 mg of salt a day. If you have hypertension, you may need to reduce your sodium intake to 1,500 mg a day. Work with your health care provider to maintain a healthy body weight or to lose weight. Ask what an ideal weight is for you. Get at least 30 minutes of exercise that causes your heart to beat faster (aerobic exercise) most days of the week. Activities may include walking, swimming, or biking. Work with your health care provider   or dietitian to adjust your eating plan to your individual calorie needs. What foods should I eat? Fruits All fresh, dried, or frozen fruit. Canned fruit in natural juice (without addedsugar). Vegetables Fresh or frozen vegetables (raw, steamed, roasted, or grilled). Low-sodium or reduced-sodium tomato and vegetable juice. Low-sodium or reduced-sodium tomatosauce and tomato paste. Low-sodium or reduced-sodium canned vegetables. Grains Whole-grain or  whole-wheat bread. Whole-grain or whole-wheat pasta. Brown rice. Oatmeal. Quinoa. Bulgur. Whole-grain and low-sodium cereals. Pita bread.Low-fat, low-sodium crackers. Whole-wheat flour tortillas. Meats and other proteins Skinless chicken or turkey. Ground chicken or turkey. Pork with fat trimmed off. Fish and seafood. Egg whites. Dried beans, peas, or lentils. Unsalted nuts, nut butters, and seeds. Unsalted canned beans. Lean cuts of beef with fat trimmed off. Low-sodium, lean precooked or cured meat, such as sausages or meatloaves. Dairy Low-fat (1%) or fat-free (skim) milk. Reduced-fat, low-fat, or fat-free cheeses. Nonfat, low-sodium ricotta or cottage cheese. Low-fat or nonfatyogurt. Low-fat, low-sodium cheese. Fats and oils Soft margarine without trans fats. Vegetable oil. Reduced-fat, low-fat, or light mayonnaise and salad dressings (reduced-sodium). Canola, safflower, olive, avocado, soybean, andsunflower oils. Avocado. Seasonings and condiments Herbs. Spices. Seasoning mixes without salt. Other foods Unsalted popcorn and pretzels. Fat-free sweets. The items listed above may not be a complete list of foods and beverages you can eat. Contact a dietitian for more information. What foods should I avoid? Fruits Canned fruit in a light or heavy syrup. Fried fruit. Fruit in cream or buttersauce. Vegetables Creamed or fried vegetables. Vegetables in a cheese sauce. Regular canned vegetables (not low-sodium or reduced-sodium). Regular canned tomato sauce and paste (not low-sodium or reduced-sodium). Regular tomato and vegetable juice(not low-sodium or reduced-sodium). Pickles. Olives. Grains Baked goods made with fat, such as croissants, muffins, or some breads. Drypasta or rice meal packs. Meats and other proteins Fatty cuts of meat. Ribs. Fried meat. Bacon. Bologna, salami, and other precooked or cured meats, such as sausages or meat loaves. Fat from the back of a pig (fatback). Bratwurst.  Salted nuts and seeds. Canned beans with added salt. Canned orsmoked fish. Whole eggs or egg yolks. Chicken or turkey with skin. Dairy Whole or 2% milk, cream, and half-and-half. Whole or full-fat cream cheese. Whole-fat or sweetened yogurt. Full-fat cheese. Nondairy creamers. Whippedtoppings. Processed cheese and cheese spreads. Fats and oils Butter. Stick margarine. Lard. Shortening. Ghee. Bacon fat. Tropical oils, suchas coconut, palm kernel, or palm oil. Seasonings and condiments Onion salt, garlic salt, seasoned salt, table salt, and sea salt. Worcestershire sauce. Tartar sauce. Barbecue sauce. Teriyaki sauce. Soy sauce, including reduced-sodium. Steak sauce. Canned and packaged gravies. Fish sauce. Oyster sauce. Cocktail sauce. Store-bought horseradish. Ketchup. Mustard. Meat flavorings and tenderizers. Bouillon cubes. Hot sauces. Pre-made or packaged marinades. Pre-made or packaged taco seasonings. Relishes. Regular saladdressings. Other foods Salted popcorn and pretzels. The items listed above may not be a complete list of foods and beverages you should avoid. Contact a dietitian for more information. Where to find more information National Heart, Lung, and Blood Institute: www.nhlbi.nih.gov American Heart Association: www.heart.org Academy of Nutrition and Dietetics: www.eatright.org National Kidney Foundation: www.kidney.org Summary The DASH eating plan is a healthy eating plan that has been shown to reduce high blood pressure (hypertension). It may also reduce your risk for type 2 diabetes, heart disease, and stroke. When on the DASH eating plan, aim to eat more fresh fruits and vegetables, whole grains, lean proteins, low-fat dairy, and heart-healthy fats. With the DASH eating plan, you should limit salt (sodium) intake to 2,300   mg a day. If you have hypertension, you may need to reduce your sodium intake to 1,500 mg a day. Work with your health care provider or dietitian to adjust  your eating plan to your individual calorie needs. This information is not intended to replace advice given to you by your health care provider. Make sure you discuss any questions you have with your healthcare provider. Document Revised: 02/21/2019 Document Reviewed: 02/21/2019 Elsevier Patient Education  2022 Elsevier Inc.  

## 2020-09-24 LAB — HEMOGLOBIN A1C
Est. average glucose Bld gHb Est-mCnc: 120 mg/dL
Hgb A1c MFr Bld: 5.8 % — ABNORMAL HIGH (ref 4.8–5.6)

## 2020-09-24 LAB — LIPID PANEL
Chol/HDL Ratio: 3.9 ratio (ref 0.0–4.4)
Cholesterol, Total: 291 mg/dL — ABNORMAL HIGH (ref 100–199)
HDL: 74 mg/dL (ref >39)
LDL Chol Calc (NIH): 206 mg/dL — ABNORMAL HIGH (ref 0–99)
Triglycerides: 70 mg/dL (ref 0–149)
VLDL Cholesterol Cal: 11 mg/dL (ref 5–40)

## 2020-09-24 LAB — VITAMIN D 25 HYDROXY (VIT D DEFICIENCY, FRACTURES): Vit D, 25-Hydroxy: 23.6 ng/mL — ABNORMAL LOW (ref 30.0–100.0)

## 2020-09-28 ENCOUNTER — Ambulatory Visit: Payer: Medicaid Other | Admitting: Licensed Clinical Social Worker

## 2020-09-29 ENCOUNTER — Encounter: Payer: Self-pay | Admitting: Oncology

## 2020-09-29 ENCOUNTER — Other Ambulatory Visit: Payer: Self-pay

## 2020-09-29 ENCOUNTER — Telehealth: Payer: Self-pay | Admitting: Pharmacy Technician

## 2020-09-29 MED ORDER — DULOXETINE HCL 30 MG PO CPEP
ORAL_CAPSULE | ORAL | 1 refills | Status: DC
  Filled 2020-09-29: qty 90, 30d supply, fill #0

## 2020-09-29 MED ORDER — HYDROXYZINE HCL 25 MG PO TABS: ORAL_TABLET | ORAL | 1 refills | Status: DC

## 2020-09-29 MED ORDER — TRAZODONE HCL 50 MG PO TABS: ORAL_TABLET | ORAL | 1 refills | Status: DC

## 2020-09-29 NOTE — Telephone Encounter (Signed)
Received updated proof of income.  Patient eligible to receive medication assistance at [facility] until time for re-certification in 2023, and as long as eligibility requirements continue to be met.  Windell Musson J. Corydon Schweiss Care Manager [facility]  

## 2020-09-30 ENCOUNTER — Other Ambulatory Visit: Payer: Self-pay

## 2020-09-30 ENCOUNTER — Ambulatory Visit: Payer: Medicaid Other | Admitting: Licensed Clinical Social Worker

## 2020-09-30 ENCOUNTER — Telehealth: Payer: Self-pay | Admitting: Licensed Clinical Social Worker

## 2020-09-30 NOTE — Telephone Encounter (Signed)
Called the patient twice during today's scheduled appointment; no answer, unable to leave a voicemail; mailbox was full.

## 2020-10-12 ENCOUNTER — Ambulatory Visit: Payer: Medicaid Other | Admitting: Licensed Clinical Social Worker

## 2020-10-14 ENCOUNTER — Ambulatory Visit: Payer: Self-pay | Admitting: Licensed Clinical Social Worker

## 2020-10-14 ENCOUNTER — Ambulatory Visit: Payer: Medicaid Other | Admitting: Licensed Clinical Social Worker

## 2020-10-14 ENCOUNTER — Other Ambulatory Visit: Payer: Self-pay

## 2020-10-14 DIAGNOSIS — F419 Anxiety disorder, unspecified: Secondary | ICD-10-CM

## 2020-10-14 DIAGNOSIS — F32A Depression, unspecified: Secondary | ICD-10-CM

## 2020-10-14 NOTE — BH Specialist Note (Signed)
Unable to start session because the patient was experiencing technical difficulties with maintaining a telephone signal. She will call to reschedule her appointment at a later date.

## 2020-10-26 ENCOUNTER — Ambulatory Visit: Payer: Medicaid Other | Admitting: Gerontology

## 2020-10-28 ENCOUNTER — Other Ambulatory Visit: Payer: Self-pay

## 2020-10-28 ENCOUNTER — Other Ambulatory Visit: Payer: Medicaid Other

## 2020-10-28 DIAGNOSIS — E785 Hyperlipidemia, unspecified: Secondary | ICD-10-CM

## 2020-10-28 DIAGNOSIS — E569 Vitamin deficiency, unspecified: Secondary | ICD-10-CM

## 2020-10-28 DIAGNOSIS — E538 Deficiency of other specified B group vitamins: Secondary | ICD-10-CM

## 2020-10-28 DIAGNOSIS — D509 Iron deficiency anemia, unspecified: Secondary | ICD-10-CM

## 2020-10-29 LAB — CBC WITH DIFFERENTIAL/PLATELET
Basophils Absolute: 0 10*3/uL (ref 0.0–0.2)
Basos: 1 %
EOS (ABSOLUTE): 0.1 10*3/uL (ref 0.0–0.4)
Eos: 3 %
Hematocrit: 37.6 % (ref 34.0–46.6)
Hemoglobin: 11.7 g/dL (ref 11.1–15.9)
Immature Grans (Abs): 0 10*3/uL (ref 0.0–0.1)
Immature Granulocytes: 0 %
Lymphocytes Absolute: 1.3 10*3/uL (ref 0.7–3.1)
Lymphs: 38 %
MCH: 24.5 pg — ABNORMAL LOW (ref 26.6–33.0)
MCHC: 31.1 g/dL — ABNORMAL LOW (ref 31.5–35.7)
MCV: 79 fL (ref 79–97)
Monocytes Absolute: 0.2 10*3/uL (ref 0.1–0.9)
Monocytes: 7 %
Neutrophils Absolute: 1.7 10*3/uL (ref 1.4–7.0)
Neutrophils: 51 %
Platelets: 322 10*3/uL (ref 150–450)
RBC: 4.77 x10E6/uL (ref 3.77–5.28)
RDW: 18.2 % — ABNORMAL HIGH (ref 11.7–15.4)
WBC: 3.4 10*3/uL (ref 3.4–10.8)

## 2020-10-29 LAB — LIPID PANEL
Chol/HDL Ratio: 4.5 ratio — ABNORMAL HIGH (ref 0.0–4.4)
Cholesterol, Total: 308 mg/dL — ABNORMAL HIGH (ref 100–199)
HDL: 68 mg/dL (ref >39)
LDL Chol Calc (NIH): 229 mg/dL — ABNORMAL HIGH (ref 0–99)
Triglycerides: 74 mg/dL (ref 0–149)
VLDL Cholesterol Cal: 11 mg/dL (ref 5–40)

## 2020-10-29 LAB — IRON,TIBC AND FERRITIN PANEL
Ferritin: 156 ng/mL — ABNORMAL HIGH (ref 15–150)
Iron Saturation: 16 % (ref 15–55)
Iron: 38 ug/dL (ref 27–159)
Total Iron Binding Capacity: 244 ug/dL — ABNORMAL LOW (ref 250–450)
UIBC: 206 ug/dL (ref 131–425)

## 2020-10-29 LAB — VITAMIN B12: Vitamin B-12: 704 pg/mL (ref 232–1245)

## 2020-10-29 LAB — VITAMIN D 25 HYDROXY (VIT D DEFICIENCY, FRACTURES): Vit D, 25-Hydroxy: 20.7 ng/mL — ABNORMAL LOW (ref 30.0–100.0)

## 2020-11-04 ENCOUNTER — Encounter: Payer: Self-pay | Admitting: Oncology

## 2020-11-04 ENCOUNTER — Other Ambulatory Visit: Payer: Self-pay

## 2020-11-04 ENCOUNTER — Encounter: Payer: Self-pay | Admitting: Gerontology

## 2020-11-04 ENCOUNTER — Ambulatory Visit: Payer: Medicaid Other | Admitting: Gerontology

## 2020-11-04 VITALS — BP 126/86 | HR 72 | Temp 97.9°F | Resp 18 | Ht 62.0 in | Wt 173.2 lb

## 2020-11-04 DIAGNOSIS — I10 Essential (primary) hypertension: Secondary | ICD-10-CM

## 2020-11-04 DIAGNOSIS — E569 Vitamin deficiency, unspecified: Secondary | ICD-10-CM

## 2020-11-04 DIAGNOSIS — E785 Hyperlipidemia, unspecified: Secondary | ICD-10-CM

## 2020-11-04 MED ORDER — ATORVASTATIN CALCIUM 10 MG PO TABS
10.0000 mg | ORAL_TABLET | Freq: Every day | ORAL | 0 refills | Status: DC
Start: 2020-11-04 — End: 2020-12-07
  Filled 2020-11-04: qty 30, 30d supply, fill #0

## 2020-11-04 MED ORDER — VITAMIN D (ERGOCALCIFEROL) 1.25 MG (50000 UNIT) PO CAPS
50000.0000 [IU] | ORAL_CAPSULE | ORAL | 0 refills | Status: DC
  Filled 2020-11-04: qty 12, 84d supply, fill #0

## 2020-11-04 MED ORDER — AMLODIPINE BESYLATE 5 MG PO TABS
5.0000 mg | ORAL_TABLET | Freq: Every day | ORAL | 2 refills | Status: DC
  Filled 2020-11-04: qty 30, 30d supply, fill #0
  Filled 2021-01-19: qty 30, 30d supply, fill #1
  Filled 2021-03-14: qty 30, 30d supply, fill #2

## 2020-11-04 NOTE — Progress Notes (Signed)
Established Patient Office Visit  Subjective:  Patient ID: Dana Dunlap, female    DOB: 1970/09/30  Age: 50 y.o. MRN: [id number]  CC:  Chief Complaint  Patient presents with   Follow-up    Labs drawn 10/28/20, reports issues with both eyes having floaters, Monitoring BP at home   Hypertension    HPI Dana Dunlap is a 50 year old female who has history of depression, eye abnormality, fibroid, hypertension, frequent headaches, presents for lab review. Her Vita D was 20.7 ng /ml, total cholesterol was 308 mg/dl, LDL was 229 mg/dl. She was seen at [facility] center on 10/21/20 by Dr Jamison Neighbor for Vitreous floaters to her eyes and she will continue to follow up . She denies chest pain, palpitation, dizziness and headache. Overall, she states that she's doing well and offers no further complaint.  Past Medical History:  Diagnosis Date   Depression    Currently on Cymbalta   Eye abnormalities    Fibroids    Frequent headaches    Worse during monthly menstrual cycle   Hypertension     Past Surgical History:  Procedure Laterality Date   BREAST MASS EXCISION     benign   KNEE SURGERY      Family History  Problem Relation Age of Onset   Hypertension Mother    Hyperlipidemia Mother    Stroke Mother    Cancer Father        Prostate cancer   Hypertension Father    Hyperlipidemia Father    Fibroids Sister    Sickle cell trait Sister    Sickle cell trait Brother    Fibroids Sister    Stroke Paternal Aunt    Diabetes Paternal Aunt    Sickle cell trait Other    Thalassemia Other     Social History   Socioeconomic History   Marital status: Single    Spouse name: Not on file   Number of children: 0   Years of education: 16   Highest education level: Bachelor's degree (e.g., BA, AB, BS)  Occupational History   Occupation: Psychologist, clinical: [employer]    Comment: Molecular biology  Tobacco Use   Smoking status: Never   Smokeless tobacco: Never  Vaping Use   Vaping  Use: Never used  Substance and Sexual Activity   Alcohol use: Yes    Alcohol/week: 1.0 standard drink    Types: 1 Glasses of wine per week   Drug use: No   Sexual activity: Yes    Birth control/protection: Condom  Other Topics Concern   Not on file  Social History Narrative   Dana Dunlap is originally from [place]. She attended [employer] in [place] where she obtained her Therapist, nutritional in Biology in 1995. 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 She is in a long term relationship with her boyfried, [name]. They have been together for 6 years. [name] transferred to [employer] with [place] in January. She enjoys reading and she enjoys the outdoors. She loves to travel. 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   Social Determinants of Health   Financial Resource Strain: Not on file  Food Insecurity: No Food Insecurity   Worried About Charity fundraiser in the Last Year: Never true   Ran Out of Food in the Last Year: Never true  Transportation Needs: No Transportation Needs  Lack of Transportation (Medical): No   Lack of Transportation (Non-Medical): No  Physical Activity: Insufficiently Active   Days of Exercise per Week: 2 days   Minutes of Exercise per Session: 30 min  Stress: Not on file  Social Connections: Not on file  Intimate Partner Violence: Not on file    Outpatient Medications Prior to Visit  Medication Sig Dispense Refill   acetaminophen (TYLENOL) 325 MG tablet Take 650 mg by mouth every 6 (six) hours as needed. Patient taking twice weekly     DULoxetine (CYMBALTA) 30 MG capsule TAKE ONE CAPSULE BY MOUTH THREE TIMES DAILY. (Patient taking differently: Take 30 mg by mouth. Patient taking 3 tablets once daily) 90 capsule 1   ferrous sulfate 325 (65 FE) MG tablet TAKE ONE TABLET BY MOUTH EVERY DAY WTH BREAKFAST 60 tablet 1   fluticasone (FLONASE) 50 MCG/ACT nasal spray PLACE 1 SPRAY  INTO BOTH NOSTRILS DAILY (Patient taking differently: Place 1 spray into both nostrils daily. Patient taking once weekly) 16 g 2   hydrOXYzine (ATARAX/VISTARIL) 25 MG tablet TAKE ONE TABLET BY MOUTH TWICE DAILY AS NEEDED. 60 tablet 1   Multiple Vitamin (MULTIVITAMIN) capsule Take 1 capsule by mouth daily. 30 capsule 1   traZODone (DESYREL) 50 MG tablet TAKE ONE TABLET BY MOUTH ONCE DAILY AT BEDTIME AS NEEDED. 30 tablet 1   amLODipine (NORVASC) 5 MG tablet Take 1 tablet (5 mg total) by mouth daily. 30 tablet 2   DULoxetine (CYMBALTA) 30 MG capsule Take 3 capsules (90 mg total) by mouth daily. 90 capsule 1   No facility-administered medications prior to visit.    Allergies  Allergen Reactions   Prochlorperazine Anaphylaxis and Other (See Comments)    Seizure Seizure   Compazine [Prochlorperazine Edisylate] Other (See Comments)    Seizure    Other     Sneezing and red eyes   Tramadol Hives    ROS Review of Systems  Constitutional: Negative.   HENT: Negative.    Respiratory: Negative.    Cardiovascular: Negative.   Neurological: Negative.      Objective:    Physical Exam HENT:     Head: Normocephalic and atraumatic.  Eyes:     Extraocular Movements: Extraocular movements intact.     Conjunctiva/sclera: Conjunctivae normal.     Pupils: Pupils are equal, round, and reactive to light.  Cardiovascular:     Rate and Rhythm: Normal rate and regular rhythm.     Pulses: Normal pulses.     Heart sounds: Normal heart sounds.  Pulmonary:     Effort: Pulmonary effort is normal.     Breath sounds: Normal breath sounds.  Skin:    General: Skin is warm.  Neurological:     General: No focal deficit present.     Mental Status: She is alert and oriented to person, place, and time. Mental status is at baseline.    BP 126/86 (BP Location: Right Arm, Patient Position: Sitting, Cuff Size: Normal)   Pulse 72   Temp 97.9 F (36.6 C)   Resp 18   Ht '5\' 2"'$  (1.575 m)   Wt 173 lb 3.2 oz  (78.6 kg)   SpO2 98%   BMI 31.68 kg/m  Wt Readings from Last 3 Encounters:  11/04/20 173 lb 3.2 oz (78.6 kg)  09/23/20 175 lb 3.2 oz (79.5 kg)  08/12/20 172 lb 11.2 oz (78.3 kg)   Encouraged weight loss  Health Maintenance Due  Topic Date Due   Pneumococcal Vaccine 0-64  Years old (1 - PCV) Never done   Hepatitis C Screening  Never done   Zoster Vaccines- Shingrix (1 of 2) Never done   COLONOSCOPY (Pts 45-82yr Insurance coverage will need to be confirmed)  Never done   PAP SMEAR-Modifier  11/12/2017   COVID-19 Vaccine (3 - Pfizer risk series) 11/04/2019   INFLUENZA VACCINE  11/01/2020    There are no preventive care reminders to display for this patient.  Lab Results  Component Value Date   TSH 1.600 12/10/2018   Lab Results  Component Value Date   WBC 3.4 10/28/2020   HGB 11.7 10/28/2020   HCT 37.6 10/28/2020   MCV 79 10/28/2020   PLT 322 10/28/2020   Lab Results  Component Value Date   NA 137 03/28/2020   K 3.8 03/28/2020   CO2 24 03/28/2020   GLUCOSE 112 (H) 03/28/2020   BUN 12 03/28/2020   CREATININE 1.06 (H) 03/28/2020   BILITOT 0.2 (L) 05/26/2020   ALKPHOS 50 03/28/2020   AST 40 03/28/2020   ALT 11 03/28/2020   PROT 6.8 03/28/2020   ALBUMIN 3.6 03/28/2020   CALCIUM 9.2 03/28/2020   ANIONGAP 6 03/28/2020   GFR 91.35 06/11/2017   Lab Results  Component Value Date   CHOL 308 (H) 10/28/2020   Lab Results  Component Value Date   HDL 68 10/28/2020   Lab Results  Component Value Date   LDLCALC 229 (H) 10/28/2020   Lab Results  Component Value Date   TRIG 74 10/28/2020   Lab Results  Component Value Date   CHOLHDL 4.5 (H) 10/28/2020   Lab Results  Component Value Date   HGBA1C 5.8 (H) 09/23/2020      Assessment & Plan:    1. Essential hypertension - Her blood pressure is under control, she will continue on current medication, DASH diet and exercise as tolerated. - amLODipine (NORVASC) 5 MG tablet; Take 1 tablet (5 mg total) by mouth  once daily.  Dispense: 30 tablet; Refill: 2  2. Hyperlipidemia, unspecified hyperlipidemia type -The 10-year ASCVD risk score (Mikey BussingDC Jr., et al., 2013) is: 4.3%   Values used to calculate the score:     Age: [age]years     Sex: Female     Is Non-Hispanic African American: Yes     Diabetic: No     Tobacco smoker: No     Systolic Blood Pressure: 1123XX123mmHg     Is BP treated: Yes     HDL Cholesterol: 68 mg/dL     Total Cholesterol: 308 mg/dL - Though her ASCVD was 4.3%, her LDL was 229 mg/dl, she will start on 10 mg Atorvastatin, was educated on medication side effects and advised to notify the clinic. - atorvastatin (LIPITOR) 10 MG tablet; Take 1 tablet (10 mg total) by mouth once daily.  Dispense: 30 tablet; Refill: 0  3. Vitamin deficiency - Her Vit D was 20.7 ng/ml, will continue on Ergocalciferol 50,000 weekly. - Vitamin D, Ergocalciferol, (DRISDOL) 1.25 MG (50000 UNIT) CAPS capsule; Take 1 capsule (50,000 Units total) by mouth every 7 (seven) days.  Dispense: 12 capsule; Refill: 0     Follow-up: Return in about 1 month (around 12/07/2020), or if symptoms worsen or fail to improve.    Geraldyn Shain EJerold Coombe NP

## 2020-11-04 NOTE — Patient Instructions (Signed)
Heart-Healthy Eating Plan Heart-healthy meal planning includes: Eating less unhealthy fats. Eating more healthy fats. Making other changes in your diet. Talk with your doctor or a diet specialist (dietitian) to create an eating plan that is right for you. What is my plan? Your doctor may recommend an eating plan that includes: Total fat: ______% or less of total calories a day. Saturated fat: ______% or less of total calories a day. Cholesterol: less than _________mg a day. What are tips for following this plan? Cooking Avoid frying your food. Try to bake, boil, grill, or broil it instead. You can also reduce fat by: Removing the skin from poultry. Removing all visible fats from meats. Steaming vegetables in water or broth. Meal planning  At meals, divide your plate into four equal parts: Fill one-half of your plate with vegetables and green salads. Fill one-fourth of your plate with whole grains. Fill one-fourth of your plate with lean protein foods. Eat 4-5 servings of vegetables per day. A serving of vegetables is: 1 cup of raw or cooked vegetables. 2 cups of raw leafy greens. Eat 4-5 servings of fruit per day. A serving of fruit is: 1 medium whole fruit.  cup of dried fruit.  cup of fresh, frozen, or canned fruit.  cup of 100% fruit juice. Eat more foods that have soluble fiber. These are apples, broccoli, carrots, beans, peas, and barley. Try to get 20-30 g of fiber per day. Eat 4-5 servings of nuts, legumes, and seeds per week: 1 serving of dried beans or legumes equals  cup after being cooked. 1 serving of nuts is  cup. 1 serving of seeds equals 1 tablespoon.  General information Eat more home-cooked food. Eat less restaurant, buffet, and fast food. Limit or avoid alcohol. Limit foods that are high in starch and sugar. Avoid fried foods. Lose weight if you are overweight. Keep track of how much salt (sodium) you eat. This is important if you have high blood  pressure. Ask your doctor to tell you more about this. Try to add vegetarian meals each week. Fats Choose healthy fats. These include olive oil and canola oil, flaxseeds, walnuts, almonds, and seeds. Eat more omega-3 fats. These include salmon, mackerel, sardines, tuna, flaxseed oil, and ground flaxseeds. Try to eat fish at least 2 times each week. Check food labels. Avoid foods with trans fats or high amounts of saturated fat. Limit saturated fats. These are often found in animal products, such as meats, butter, and cream. These are also found in plant foods, such as palm oil, palm kernel oil, and coconut oil. Avoid foods with partially hydrogenated oils in them. These have trans fats. Examples are stick margarine, some tub margarines, cookies, crackers, and other baked goods. What foods can I eat? Fruits All fresh, canned (in natural juice), or frozen fruits. Vegetables Fresh or frozen vegetables (raw, steamed, roasted, or grilled). Green salads. Grains Most grains. Choose whole wheat and whole grains most of the time. Rice andpasta, including brown rice and pastas made with whole wheat. Meats and other proteins Lean, well-trimmed beef, veal, pork, and lamb. Chicken and turkey without skin. All fish and shellfish. Wild duck, rabbit, pheasant, and venison. Egg whites or low-cholesterol egg substitutes. Dried beans, peas, lentils, and tofu. Seedsand most nuts. Dairy Low-fat or nonfat cheeses, including ricotta and mozzarella. Skim or 1% milk that is liquid, powdered, or evaporated. Buttermilk that is made with low-fatmilk. Nonfat or low-fat yogurt. Fats and oils Non-hydrogenated (trans-free) margarines. Vegetable oils, including soybean, sesame,   sunflower, olive, peanut, safflower, corn, canola, and cottonseed. Salad dressings or mayonnaisemade with a vegetable oil. Beverages Mineral water. Coffee and tea. Diet carbonated beverages. Sweets and desserts Sherbet, gelatin, and fruit ice. Small  amounts of dark chocolate. Limit all sweets and desserts. Seasonings and condiments All seasonings and condiments. The items listed above may not be a complete list of foods and drinks you can eat. Contact a dietitian for more options. What foods should I avoid? Fruits Canned fruit in heavy syrup. Fruit in cream or butter sauce. Fried fruit. Limitcoconut. Vegetables Vegetables cooked in cheese, cream, or butter sauce. Fried vegetables. Grains Breads that are made with saturated or trans fats, oils, or whole milk. Croissants. Sweet rolls. Donuts. High-fat crackers,such as cheese crackers. Meats and other proteins Fatty meats, such as hot dogs, ribs, sausage, bacon, rib-eye roast or steak. High-fat deli meats, such as salami and bologna. Caviar. Domestic duck andgoose. Organ meats, such as liver. Dairy Cream, sour cream, cream cheese, and creamed cottage cheese. Whole-milk cheeses. Whole or 2% milk that is liquid, evaporated, or condensed. Whole buttermilk. Cream sauce or high-fat cheese sauce. Yogurt that is made fromwhole milk. Fats and oils Meat fat, or shortening. Cocoa butter, hydrogenated oils, palm oil, coconut oil, palm kernel oil. Solid fats and shortenings, including bacon fat, salt pork, lard, and butter. Nondairy cream substitutes. Salad dressings with cheeseor sour cream. Beverages Regular sodas and juice drinks with added sugar. Sweets and desserts Frosting. Pudding. Cookies. Cakes. Pies. Milk chocolate or white chocolate.Buttered syrups. Full-fat ice cream or ice cream drinks. The items listed above may not be a complete list of foods and drinks to avoid. Contact a dietitian for more information. Summary Heart-healthy meal planning includes eating less unhealthy fats, eating more healthy fats, and making other changes in your diet. Eat a balanced diet. This includes fruits and vegetables, low-fat or nonfat dairy, lean protein, nuts and legumes, whole grains, and heart-healthy  oils and fats. This information is not intended to replace advice given to you by your health care provider. Make sure you discuss any questions you have with your healthcare provider. Document Revised: 05/24/2017 Document Reviewed: 04/27/2017 Elsevier Patient Education  [place]. [email or web address]">  DASH Eating Plan DASH stands for Dietary Approaches to Stop Hypertension. The DASH eating plan is a healthy eating plan that has been shown to: Reduce high blood pressure (hypertension). Reduce your risk for type 2 diabetes, heart disease, and stroke. Help with weight loss. What are tips for following this plan? Reading food labels Check food labels for the amount of salt (sodium) per serving. Choose foods with less than 5 percent of the Daily Value of sodium. Generally, foods with less than 300 milligrams (mg) of sodium per serving fit into this eating plan. To find whole grains, look for the word "whole" as the first word in the ingredient list. Shopping Buy products labeled as "low-sodium" or "no salt added." Buy fresh foods. Avoid canned foods and pre-made or frozen meals. Cooking Avoid adding salt when cooking. Use salt-free seasonings or herbs instead of table salt or sea salt. Check with your health care provider or pharmacist before using salt substitutes. Do not fry foods. Cook foods using healthy methods such as baking, boiling, grilling, roasting, and broiling instead. Cook with heart-healthy oils, such as olive, canola, avocado, soybean, or sunflower oil. Meal planning  Eat a balanced diet that includes: 4 or more servings of fruits and 4 or more servings of vegetables each day. Try to fill  one-half of your plate with fruits and vegetables. 6-8 servings of whole grains each day. Less than 6 oz (170 g) of lean meat, poultry, or fish each day. A 3-oz (85-g) serving of meat is about the same size as a deck of cards. One egg  equals 1 oz (28 g). 2-3 servings of low-fat dairy each day. One serving is 1 cup (237 mL). 1 serving of nuts, seeds, or beans 5 times each week. 2-3 servings of heart-healthy fats. Healthy fats called omega-3 fatty acids are found in foods such as walnuts, flaxseeds, fortified milks, and eggs. These fats are also found in cold-water fish, such as sardines, salmon, and mackerel. Limit how much you eat of: Canned or prepackaged foods. Food that is high in trans fat, such as some fried foods. Food that is high in saturated fat, such as fatty meat. Desserts and other sweets, sugary drinks, and other foods with added sugar. Full-fat dairy products. Do not salt foods before eating. Do not eat more than 4 egg yolks a week. Try to eat at least 2 vegetarian meals a week. Eat more home-cooked food and less restaurant, buffet, and fast food.  Lifestyle When eating at a restaurant, ask that your food be prepared with less salt or no salt, if possible. If you drink alcohol: Limit how much you use to: 0-1 drink a day for women who are not pregnant. 0-2 drinks a day for men. Be aware of how much alcohol is in your drink. In the [place] one drink equals one 12 oz bottle of beer (355 mL), one 5 oz glass of wine (148 mL), or one 1 oz glass of hard liquor (44 mL). General information Avoid eating more than 2,300 mg of salt a day. If you have hypertension, you may need to reduce your sodium intake to 1,500 mg a day. Work with your health care provider to maintain a healthy body weight or to lose weight. Ask what an ideal weight is for you. Get at least 30 minutes of exercise that causes your heart to beat faster (aerobic exercise) most days of the week. Activities may include walking, swimming, or biking. Work with your health care provider or dietitian to adjust your eating plan to your individual calorie needs. What foods should I eat? Fruits All fresh, dried, or frozen fruit. Canned fruit in natural juice  (without addedsugar). Vegetables Fresh or frozen vegetables (raw, steamed, roasted, or grilled). Low-sodium or reduced-sodium tomato and vegetable juice. Low-sodium or reduced-sodium tomatosauce and tomato paste. Low-sodium or reduced-sodium canned vegetables. Grains Whole-grain or whole-wheat bread. Whole-grain or whole-wheat pasta. Brown rice. [place]. Bulgur. Whole-grain and low-sodium cereals. Pita bread.Low-fat, low-sodium crackers. Whole-wheat flour tortillas. Meats and other proteins Skinless chicken or [place]. Ground chicken or [place]. Pork with fat trimmed off. Fish and seafood. Egg whites. Dried beans, peas, or lentils. Unsalted nuts, nut butters, and seeds. Unsalted canned beans. Lean cuts of beef with fat trimmed off. Low-sodium, lean precooked or cured meat, such as sausages or meatloaves. Dairy Low-fat (1%) or fat-free (skim) milk. Reduced-fat, low-fat, or fat-free cheeses. Nonfat, low-sodium ricotta or cottage cheese. Low-fat or nonfatyogurt. Low-fat, low-sodium cheese. Fats and oils Soft margarine without trans fats. Vegetable oil. Reduced-fat, low-fat, or light mayonnaise and salad dressings (reduced-sodium). Canola, safflower, olive, avocado, soybean, andsunflower oils. Avocado. Seasonings and condiments Herbs. Spices. Seasoning mixes without salt. Other foods Unsalted popcorn and pretzels. Fat-free sweets. The items listed above may not be a complete list of foods and beverages you  can eat. Contact a dietitian for more information. What foods should I avoid? Fruits Canned fruit in a light or heavy syrup. Fried fruit. Fruit in cream or buttersauce. Vegetables Creamed or fried vegetables. Vegetables in a cheese sauce. Regular canned vegetables (not low-sodium or reduced-sodium). Regular canned tomato sauce and paste (not low-sodium or reduced-sodium). Regular tomato and vegetable juice(not low-sodium or reduced-sodium). Angie Fava. Olives. Grains Baked goods made with fat,  such as croissants, muffins, or some breads. Drypasta or rice meal packs. Meats and other proteins Fatty cuts of meat. Ribs. Fried meat. Berniece Salines. Bologna, salami, and other precooked or cured meats, such as sausages or meat loaves. Fat from the back of a pig (fatback). Bratwurst. Salted nuts and seeds. Canned beans with added salt. Canned orsmoked fish. Whole eggs or egg yolks. Chicken or [place] with skin. Dairy Whole or 2% milk, cream, and half-and-half. Whole or full-fat cream cheese. Whole-fat or sweetened yogurt. Full-fat cheese. Nondairy creamers. Whippedtoppings. Processed cheese and cheese spreads. Fats and oils Butter. Stick margarine. Lard. Shortening. Ghee. Bacon fat. Tropical oils, suchas coconut, palm kernel, or palm oil. Seasonings and condiments Onion salt, garlic salt, seasoned salt, table salt, and sea salt. Worcestershire sauce. Tartar sauce. Barbecue sauce. Teriyaki sauce. Soy sauce, including reduced-sodium. Steak sauce. Canned and packaged gravies. Fish sauce. Oyster sauce. Cocktail sauce. Store-bought horseradish. Ketchup. Mustard. Meat flavorings and tenderizers. Bouillon cubes. Hot sauces. Pre-made or packaged marinades. Pre-made or packaged taco seasonings. Relishes. Regular saladdressings. Other foods Salted popcorn and pretzels. The items listed above may not be a complete list of foods and beverages you should avoid. Contact a dietitian for more information. Where to find more information National Heart, Lung, and Blood Institute: https://wilson-eaton.com/ American Heart Association: www.heart.org Academy of Nutrition and Dietetics: www.eatright.[place]: www.kidney.org Summary The DASH eating plan is a healthy eating plan that has been shown to reduce high blood pressure (hypertension). It may also reduce your risk for type 2 diabetes, heart disease, and stroke. When on the DASH eating plan, aim to eat more fresh fruits and vegetables, whole grains, lean  proteins, low-fat dairy, and heart-healthy fats. With the DASH eating plan, you should limit salt (sodium) intake to 2,300 mg a day. If you have hypertension, you may need to reduce your sodium intake to 1,500 mg a day. Work with your health care provider or dietitian to adjust your eating plan to your individual calorie needs. This information is not intended to replace advice given to you by your health care provider. Make sure you discuss any questions you have with your healthcare provider. Document Revised: 02/21/2019 Document Reviewed: 02/21/2019 Elsevier Patient Education  2022 [employer].

## 2020-11-13 ENCOUNTER — Other Ambulatory Visit: Payer: Self-pay | Admitting: Oncology

## 2020-11-13 ENCOUNTER — Other Ambulatory Visit: Payer: Self-pay | Admitting: *Deleted

## 2020-11-13 DIAGNOSIS — D509 Iron deficiency anemia, unspecified: Secondary | ICD-10-CM

## 2020-11-16 ENCOUNTER — Inpatient Hospital Stay: Payer: Medicaid Other | Attending: Oncology

## 2020-11-16 ENCOUNTER — Other Ambulatory Visit: Payer: Self-pay

## 2020-11-16 ENCOUNTER — Inpatient Hospital Stay (HOSPITAL_BASED_OUTPATIENT_CLINIC_OR_DEPARTMENT_OTHER): Payer: Self-pay | Admitting: Oncology

## 2020-11-16 VITALS — BP 149/82 | HR 59 | Temp 96.9°F | Resp 16 | Wt 176.0 lb

## 2020-11-16 DIAGNOSIS — D75839 Thrombocytosis, unspecified: Secondary | ICD-10-CM | POA: Insufficient documentation

## 2020-11-16 DIAGNOSIS — D509 Iron deficiency anemia, unspecified: Secondary | ICD-10-CM

## 2020-11-16 DIAGNOSIS — N920 Excessive and frequent menstruation with regular cycle: Secondary | ICD-10-CM | POA: Insufficient documentation

## 2020-11-16 DIAGNOSIS — Z79899 Other long term (current) drug therapy: Secondary | ICD-10-CM | POA: Insufficient documentation

## 2020-11-16 DIAGNOSIS — D500 Iron deficiency anemia secondary to blood loss (chronic): Secondary | ICD-10-CM | POA: Insufficient documentation

## 2020-11-16 DIAGNOSIS — D259 Leiomyoma of uterus, unspecified: Secondary | ICD-10-CM | POA: Insufficient documentation

## 2020-11-16 LAB — CBC
HCT: 39.4 % (ref 36.0–46.0)
Hemoglobin: 12.5 g/dL (ref 12.0–15.0)
MCH: 25.7 pg — ABNORMAL LOW (ref 26.0–34.0)
MCHC: 31.7 g/dL (ref 30.0–36.0)
MCV: 80.9 fL (ref 80.0–100.0)
Platelets: 270 10*3/uL (ref 150–400)
RBC: 4.87 MIL/uL (ref 3.87–5.11)
RDW: 15.5 % (ref 11.5–15.5)
WBC: 3.2 10*3/uL — ABNORMAL LOW (ref 4.0–10.5)
nRBC: 0 % (ref 0.0–0.2)

## 2020-11-16 LAB — IRON AND TIBC
Iron: 48 ug/dL (ref 28–170)
Saturation Ratios: 15 % (ref 10.4–31.8)
TIBC: 315 ug/dL (ref 250–450)
UIBC: 267 ug/dL

## 2020-11-16 LAB — FERRITIN: Ferritin: 89 ng/mL (ref 11–307)

## 2020-11-16 NOTE — Progress Notes (Signed)
Hematology/Oncology Consult note [facility]  Telephone:([phone] [phone]  Patient Care Team: Langston Reusing, NP as PCP - General (Gerontology) Elmarie Shiley, MD as Consulting Physician (Internal Medicine) Rico Junker, RN as Registered Nurse Theodore Demark, RN as Registered Nurse Sindy Guadeloupe, MD as Consulting Physician (Oncology)   Name of the patient: Denyel Pabla  [id number]  07-Jul-1970   Date of visit: 11/17/20  Diagnosis-history of iron deficiency anemia  Chief complaint/ Reason for visit-routine follow-up of iron deficiency anemia  Heme/Onc history:  Patient is a 50 year old African-American female who was seen by Dr. Grayland Ormond for iron deficiency anemia and has required IV iron in the past.  She is transferring her care to me.  Etiology of iron deficiency anemia has been related to be heavy menstrual bleeding secondary to fibroids.  Patient also has history of thrombocytosis which has been attributed to iron deficiency.  Patient has concerns that she has sickle cell disease but on hemoglobinopathy evaluation was normal in 2018.  She receives intermittent IV Feraheme last given on 07/16/2020 and 07/23/2020.  She had a partial knee replacement in 2021 of her left knee.  Reports she still has some pain and limited mobility.  Interval history-she reports improvement of her heavy menstrual bleeding over the past several months.  Reports a recent heavy menstrual cycle but states it is certainly better than it was.  She continues to explore nonsurgical options for her fibroids and was evaluated in [place] and has an appointment in January 2023 at [facility] with a fibroid expert.  She was recently diagnosed with high cholesterol and was started on Lipitor.  Reports "floaters" in her eyes that is new.  She has been seen by [facility] last on 10/21/2020 and has follow-up scheduled.  She reports having years and years of bad medical care  with female physicians.  She now will only see women physicians.   ECOG PS- 1 Pain scale- 0   Review of systems- Review of Systems  Constitutional:  Positive for malaise/fatigue.  Eyes:         Floaters  Genitourinary:        Heavy menstrual cycles  Musculoskeletal:  Positive for joint pain and myalgias.  Endo/Heme/Allergies:        Hot flashes  Psychiatric/Behavioral:  The patient is nervous/anxious.      Allergies  Allergen Reactions   Prochlorperazine Anaphylaxis and Other (See Comments)    Seizure Seizure   Compazine [Prochlorperazine Edisylate] Other (See Comments)    Seizure    Other     Sneezing and red eyes   Tramadol Hives     Past Medical History:  Diagnosis Date   Depression    Currently on Cymbalta   Eye abnormalities    Fibroids    Frequent headaches    Worse during monthly menstrual cycle   Hypertension      Past Surgical History:  Procedure Laterality Date   BREAST MASS EXCISION     benign   KNEE SURGERY      Social History   Socioeconomic History   Marital status: Single    Spouse name: Not on file   Number of children: 0   Years of education: 16   Highest education level: Bachelor's degree (e.g., BA, AB, BS)  Occupational History   Occupation: Psychologist, 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 She attended [employer] in [place] where she obtained her Therapist, nutritional in Biology in 1995. 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 She is in a long term  relationship with her boyfried, [name]. They have been together for 6 years. [name] transferred to [facility] with [place] in January. She enjoys reading and she enjoys the outdoors. She loves to travel. 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   Social Determinants of Health   Financial Resource Strain: Not on file  Food Insecurity: No Food Insecurity   Worried About Charity fundraiser in the Last Year: Never true   Ran Out of Food in the Last Year: Never true  Transportation Needs: No Transportation Needs   Lack of Transportation (Medical): No   Lack of Transportation (Non-Medical): No  Physical Activity: Insufficiently Active   Days of Exercise per Week: 2 days   Minutes of Exercise per Session: 30 min  Stress: Not on file  Social Connections: Not on file  Intimate Partner Violence: Not on file    Family History  Problem Relation Age of Onset   Hypertension Mother    Hyperlipidemia Mother    Stroke Mother    Cancer Father        Prostate cancer   Hypertension Father    Hyperlipidemia Father    Fibroids Sister    Sickle cell trait Sister    Sickle cell trait Brother    Fibroids Sister    Stroke Paternal Aunt    Diabetes Paternal Aunt    Sickle cell trait Other    Thalassemia Other      Current Outpatient Medications:    acetaminophen (TYLENOL) 325 MG tablet, Take 650 mg by mouth every 6 (six) hours as needed. Patient taking twice weekly, Disp: , Rfl:    amLODipine (NORVASC) 5 MG tablet, Take 1 tablet (5 mg total) by mouth once daily., Disp: 30 tablet, Rfl: 2   atorvastatin (LIPITOR) 10 MG tablet, Take 1 tablet (10 mg total) by mouth once daily., Disp: 30 tablet, Rfl: 0   DULoxetine (CYMBALTA) 30 MG capsule, TAKE ONE CAPSULE BY MOUTH THREE TIMES DAILY. (Patient taking differently: Take 30 mg by mouth. Patient taking 3 tablets once daily), Disp: 90 capsule, Rfl: 1   ferrous sulfate 325 (65 FE) MG tablet, TAKE ONE TABLET BY MOUTH EVERY DAY WTH  BREAKFAST, Disp: 60 tablet, Rfl: 1   fluticasone (FLONASE) 50 MCG/ACT nasal spray, PLACE 1 SPRAY INTO BOTH NOSTRILS DAILY (Patient taking differently: Place 1 spray into both nostrils daily. Patient taking once weekly), Disp: 16 g, Rfl: 2   hydrOXYzine (ATARAX/VISTARIL) 25 MG tablet, TAKE ONE TABLET BY MOUTH TWICE DAILY AS NEEDED., Disp: 60 tablet, Rfl: 1   Multiple Vitamin (MULTIVITAMIN) capsule, Take 1 capsule by mouth daily., Disp: 30 capsule, Rfl: 1   traZODone (DESYREL) 50 MG tablet, TAKE ONE TABLET BY MOUTH ONCE DAILY AT BEDTIME AS NEEDED., Disp: 30 tablet, Rfl: 1   Vitamin  D, Ergocalciferol, (DRISDOL) 1.25 MG (50000 UNIT) CAPS capsule, Take 1 capsule (50,000 Units total) by mouth every 7 (seven) days., Disp: 12 capsule, Rfl: 0  Physical exam:  Vitals:   11/16/20 1435  BP: (!) 149/82  Pulse: (!) 59  Resp: 16  Temp: (!) 96.9 F (36.1 C)  TempSrc: Tympanic  SpO2: 97%  Weight: 176 lb (79.8 kg)   Physical Exam Constitutional:      Appearance: Normal appearance. She is obese.  Cardiovascular:     Rate and Rhythm: Normal rate.  Pulmonary:     Effort: Pulmonary effort is normal.     Breath sounds: Normal breath sounds.  Abdominal:     General: Bowel sounds are normal.     Palpations: Abdomen is soft.  Musculoskeletal:        General: No swelling.     Left knee: Swelling present.  Neurological:     Mental Status: She is alert and oriented to person, place, and time. Mental status is at baseline.     CMP Latest Ref Rng & Units 05/26/2020  Glucose 70 - 99 mg/dL -  BUN 6 - 20 mg/dL -  Creatinine 0.44 - 1.00 mg/dL -  Sodium 135 - 145 mmol/L -  Potassium 3.5 - 5.1 mmol/L -  Chloride 98 - 111 mmol/L -  CO2 22 - 32 mmol/L -  Calcium 8.9 - 10.3 mg/dL -  Total Protein 6.5 - 8.1 g/dL -  Total Bilirubin 0.3 - 1.2 mg/dL 0.2(L)  Alkaline Phos 38 - 126 U/L -  AST 15 - 41 U/L -  ALT 0 - 44 U/L -   CBC Latest Ref Rng & Units 11/16/2020  WBC 4.0 - 10.5 K/uL 3.2(L)  Hemoglobin 12.0 -  15.0 g/dL 12.5  Hematocrit 36.0 - 46.0 % 39.4  Platelets 150 - 400 K/uL 270      Assessment and plan- Patient is a 50 y.o. female with history of iron deficiency anemia and thrombocytosis here for routine follow-up.   Lab work from today show hemoglobin of 12.5, ferritin 89 and iron saturations 15%.  She is feeling overall stable.  She intermittently receives IV iron last on 07/16/2020 and 07/23/2020.  Her menstrual cycles had improved dramatically over the past several months but reports a recent heavy menstrual cycle.  Given, she is feeling improved we will hold off on additional IV iron at this time.  She has a scheduled appointment in January 2023 with a fibroid specialist at [facility].  She is hoping for Surgical options.  I will have her return to clinic in 6 weeks for lab work only given her labs are trending down and heavy bleeding has returned followed by an appointment in 12 weeks with labs, MD assessment and possible iron.  Patient in agreement.  I spent 25 minutes dedicated to the care of this patient (face-to-face and non-face-to-face) on the date of the encounter to include what is described in the assessment and plan.    Visit Diagnosis 1. Iron deficiency anemia, unspecified iron deficiency anemia type    Faythe Casa, NP 11/17/2020 9:31 AM

## 2020-11-16 NOTE — Progress Notes (Signed)
Follow-up anemia. [facility] States she has also had some visual disturbances. She will be following-up with an opthomologist for this. She went to [place] for an opinion on the treatment for her fibroids, but this was not going to be affordable to her.

## 2020-11-17 ENCOUNTER — Encounter: Payer: Self-pay | Admitting: Oncology

## 2020-12-02 ENCOUNTER — Other Ambulatory Visit: Payer: Self-pay

## 2020-12-07 ENCOUNTER — Ambulatory Visit: Payer: Medicaid Other | Admitting: Gerontology

## 2020-12-07 ENCOUNTER — Other Ambulatory Visit: Payer: Self-pay

## 2020-12-07 ENCOUNTER — Encounter: Payer: Self-pay | Admitting: Oncology

## 2020-12-07 ENCOUNTER — Encounter: Payer: Self-pay | Admitting: Gerontology

## 2020-12-07 VITALS — BP 130/88 | HR 66 | Temp 97.5°F | Resp 16 | Ht 60.0 in | Wt 175.7 lb

## 2020-12-07 DIAGNOSIS — E785 Hyperlipidemia, unspecified: Secondary | ICD-10-CM

## 2020-12-07 DIAGNOSIS — R0981 Nasal congestion: Secondary | ICD-10-CM | POA: Insufficient documentation

## 2020-12-07 MED ORDER — ATORVASTATIN CALCIUM 10 MG PO TABS
10.0000 mg | ORAL_TABLET | Freq: Every day | ORAL | 2 refills | Status: DC
  Filled 2020-12-07: qty 90, 90d supply, fill #0

## 2020-12-07 MED ORDER — FLUTICASONE PROPIONATE 50 MCG/ACT NA SUSP
1.0000 | Freq: Every day | NASAL | 2 refills | Status: DC
  Filled 2020-12-07: qty 16, 60d supply, fill #0
  Filled 2021-02-14: qty 16, 60d supply, fill #1
  Filled 2021-04-11: qty 16, 60d supply, fill #2

## 2020-12-07 MED ORDER — SALINE SPRAY 0.65 % NA SOLN
1.0000 | NASAL | 0 refills | Status: AC | PRN
  Filled 2020-12-07: qty 30, fill #0

## 2020-12-07 NOTE — Progress Notes (Signed)
Established Patient Office Visit  Subjective:  Patient ID: Dana Dunlap, female    DOB: Sep 05, 1970  Age: 50 y.o. MRN: [id number]  CC:  Chief Complaint  Patient presents with   Follow-up   Hypertension   Hyperlipidemia   Nasal Congestion    Patient c/o nasal congestion and headache x 2 weeks. Patient has been taking OTC Advil and Aleve. Patient took an at home covid test and it was negative.    HPI Dana Dunlap  is a 50 year old female who has history of depression, eye abnormality, fibroid, hypertension, frequent headaches presents for follow up hyperlipidemia and nasal congestion. She was started on 10 mg Atorvastatin for LDL of 229 mg/dl and total cholesterol of 308 mg/dl. She denies myalgia and dark urine. She also c/o nasal congestion that has been going on for 2 weeks, states that cough has resolved, but continues to experience intermittent sneezing and rhinorrhea, denies fever, chills, sore throat and headache. Overall, she states that she's doing well and offers no further complaint.  Past Medical History:  Diagnosis Date   Anemia    Depression    Currently on Cymbalta   Eye abnormalities    Fibroids    Frequent headaches    Worse during monthly menstrual cycle   Hypertension     Past Surgical History:  Procedure Laterality Date   BREAST MASS EXCISION     benign   KNEE SURGERY      Family History  Problem Relation Age of Onset   Hypertension Mother    Hyperlipidemia Mother    Stroke Mother    Cancer Father        Prostate cancer   Hypertension Father    Hyperlipidemia Father    Fibroids Sister    Sickle cell trait Sister    Sickle cell trait Brother    Fibroids Sister    Stroke Paternal Aunt    Diabetes Paternal Aunt    Sickle cell trait Other    Thalassemia Other     Social History   Socioeconomic History   Marital status: Single    Spouse name: Not on file   Number of children: 0   Years of education: 16   Highest education level:  Bachelor's degree (e.g., BA, AB, BS)  Occupational History   Occupation: Psychologist, 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 She attended [employer] in [place] where she obtained her Therapist, nutritional in Biology in 1995. 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 She is in a long term relationship with her boyfried, [name]. They have been together for 6 years. [name] transferred to [facility] with [place] in January. She enjoys reading and she enjoys the outdoors. She loves to travel. 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   Social Determinants of Health   Financial Resource Strain: Not on file  Food Insecurity: No  Food Insecurity   Worried About Charity fundraiser in the Last Year: Never true   Ran Out of Food in the Last Year: Never true  Transportation Needs: No Transportation Needs   Lack of Transportation (Medical): No   Lack of Transportation (Non-Medical): No  Physical Activity: Insufficiently Active   Days of Exercise per Week: 2 days   Minutes of Exercise per Session: 30 min  Stress: Not on file  Social Connections: Not on file  Intimate Partner Violence: Not on file    Outpatient Medications Prior to Visit  Medication Sig Dispense Refill   acetaminophen (TYLENOL) 325 MG tablet Take 650 mg by mouth every 6 (six) hours as needed. Patient taking twice weekly     amLODipine (NORVASC) 5 MG tablet Take 1 tablet (5 mg total) by mouth once daily. 30 tablet 2   DULoxetine  (CYMBALTA) 30 MG capsule TAKE ONE CAPSULE BY MOUTH THREE TIMES DAILY. (Patient taking differently: Take 30 mg by mouth. Patient taking 3 tablets once daily) 90 capsule 1   ferrous sulfate 325 (65 FE) MG tablet TAKE ONE TABLET BY MOUTH EVERY DAY WTH BREAKFAST 60 tablet 1   hydrOXYzine (ATARAX/VISTARIL) 25 MG tablet TAKE ONE TABLET BY MOUTH TWICE DAILY AS NEEDED. 60 tablet 1   Multiple Vitamin (MULTIVITAMIN) capsule Take 1 capsule by mouth daily. 30 capsule 1   traZODone (DESYREL) 50 MG tablet TAKE ONE TABLET BY MOUTH ONCE DAILY AT BEDTIME AS NEEDED. 30 tablet 1   Vitamin D, Ergocalciferol, (DRISDOL) 1.25 MG (50000 UNIT) CAPS capsule Take 1 capsule (50,000 Units total) by mouth every 7 (seven) days. 12 capsule 0   atorvastatin (LIPITOR) 10 MG tablet Take 1 tablet (10 mg total) by mouth once daily. 30 tablet 0   fluticasone (FLONASE) 50 MCG/ACT nasal spray PLACE 1 SPRAY INTO BOTH NOSTRILS DAILY (Patient not taking: Reported on 12/07/2020) 16 g 2   No facility-administered medications prior to visit.    Allergies  Allergen Reactions   Prochlorperazine Anaphylaxis and Other (See Comments)    Seizure Seizure Other reaction(s): Other (See Comments), Other (See Comments), Other (See Comments) Seizure Seizure Seizure Seizure   Tramadol Hives   Compazine [Prochlorperazine Edisylate] Other (See Comments)    Seizure    Other     Sneezing and red eyes    ROS Review of Systems  Constitutional: Negative.   HENT:  Positive for rhinorrhea and sneezing.   Eyes: Negative.   Respiratory: Negative.    Cardiovascular: Negative.      Objective:    Physical Exam HENT:     Head: Normocephalic and atraumatic.     Nose: No nasal tenderness or rhinorrhea.     Right Turbinates: Not enlarged, swollen or pale.     Left Turbinates: Not enlarged, swollen or pale.     Right Sinus: No maxillary sinus tenderness or frontal sinus tenderness.     Left Sinus: No maxillary sinus tenderness or frontal sinus  tenderness.  Cardiovascular:     Rate and Rhythm: Normal rate and regular rhythm.     Pulses: Normal pulses.     Heart sounds: Normal heart sounds.  Pulmonary:     Effort: Pulmonary effort is normal.     Breath sounds: Normal breath sounds.  Neurological:     General: No focal deficit present.     Mental Status: She is alert and oriented to person, place, and time. Mental status is at baseline.    BP 130/88 (BP Location: Left  Arm, Patient Position: Sitting, Cuff Size: Large)   Pulse 66   Temp (!) 97.5 F (36.4 C)   Resp 16   Ht 5' (1.524 m)   Wt 175 lb 11.2 oz (79.7 kg)   LMP 11/06/2020 (Approximate)   SpO2 95%   BMI 34.31 kg/m  Wt Readings from Last 3 Encounters:  12/07/20 175 lb 11.2 oz (79.7 kg)  11/16/20 176 lb (79.8 kg)  11/04/20 173 lb 3.2 oz (78.6 kg)   Encouraged weight loss  Health Maintenance Due  Topic Date Due   Pneumococcal Vaccine 50-46 Years old (1 - PCV) Never done   Hepatitis C Screening  Never done   Zoster Vaccines- Shingrix (1 of 2) Never done   COLONOSCOPY (Pts 45-61yr Insurance coverage will need to be confirmed)  Never done   PAP SMEAR-Modifier  11/12/2017   COVID-19 Vaccine (3 - Pfizer risk series) 11/04/2019   INFLUENZA VACCINE  Never done    There are no preventive care reminders to display for this patient.  Lab Results  Component Value Date   TSH 1.600 12/10/2018   Lab Results  Component Value Date   WBC 3.2 (L) 11/16/2020   HGB 12.5 11/16/2020   HCT 39.4 11/16/2020   MCV 80.9 11/16/2020   PLT 270 11/16/2020   Lab Results  Component Value Date   NA 137 03/28/2020   K 3.8 03/28/2020   CO2 24 03/28/2020   GLUCOSE 112 (H) 03/28/2020   BUN 12 03/28/2020   CREATININE 1.06 (H) 03/28/2020   BILITOT 0.2 (L) 05/26/2020   ALKPHOS 50 03/28/2020   AST 40 03/28/2020   ALT 11 03/28/2020   PROT 6.8 03/28/2020   ALBUMIN 3.6 03/28/2020   CALCIUM 9.2 03/28/2020   ANIONGAP 6 03/28/2020   GFR 91.35 06/11/2017   Lab Results  Component  Value Date   CHOL 308 (H) 10/28/2020   Lab Results  Component Value Date   HDL 68 10/28/2020   Lab Results  Component Value Date   LDLCALC 229 (H) 10/28/2020   Lab Results  Component Value Date   TRIG 74 10/28/2020   Lab Results  Component Value Date   CHOLHDL 4.5 (H) 10/28/2020   Lab Results  Component Value Date   HGBA1C 5.8 (H) 09/23/2020      Assessment & Plan:   1. Hyperlipidemia, unspecified hyperlipidemia type -She will continue on current medication, low fat/cholesterol diet and exercise as tolerated. - atorvastatin (LIPITOR) 10 MG tablet; Take 1 tablet (10 mg total) by mouth once daily.  Dispense: 30 tablet; Refill: 2 - Lipid panel; Future  2. Nasal congestion -Her symptoms are resolving, likely viral . She will continue on Flonase and Saline spray. She was advised to notify clinic with worsening symptoms. - fluticasone (FLONASE) 50 MCG/ACT nasal spray; PLACE 1 SPRAY INTO BOTH NOSTRILS ONCE DAILY  Dispense: 16 g; Refill: 2 - sodium chloride (OCEAN) 0.65 % SOLN nasal spray; Place 1 spray into both nostrils as needed for congestion.  Dispense: 30 mL; Refill: 0     Follow-up: Return in about 9 weeks (around 02/08/2021).    Surya Folden EJerold Coombe NP

## 2020-12-07 NOTE — Patient Instructions (Signed)
Heart-Healthy Eating Plan Many factors influence your heart (coronary) health, including eating and exercise habits. Coronary risk increases with abnormal blood fat (lipid) levels. Heart-healthy meal planning includes limiting unhealthy fats,increasing healthy fats, and making other diet and lifestyle changes. What is my plan? Your health care provider may recommend that you: Limit your fat intake to _________% or less of your total calories each day. Limit your saturated fat intake to _________% or less of your total calories each day. Limit the amount of cholesterol in your diet to less than _________ mg per day. What are tips for following this plan? Cooking Cook foods using methods other than frying. Baking, boiling, grilling, and broiling are all good options. Other ways to reduce fat include: Removing the skin from poultry. Removing all visible fats from meats. Steaming vegetables in water or broth. Meal planning  At meals, imagine dividing your plate into fourths: Fill one-half of your plate with vegetables and green salads. Fill one-fourth of your plate with whole grains. Fill one-fourth of your plate with lean protein foods. Eat 4-5 servings of vegetables per day. One serving equals 1 cup raw or cooked vegetable, or 2 cups raw leafy greens. Eat 4-5 servings of fruit per day. One serving equals 1 medium whole fruit,  cup dried fruit,  cup fresh, frozen, or canned fruit, or  cup 100% fruit juice. Eat more foods that contain soluble fiber. Examples include apples, broccoli, carrots, beans, peas, and barley. Aim to get 25-30 g of fiber per day. Increase your consumption of legumes, nuts, and seeds to 4-5 servings per week. One serving of dried beans or legumes equals  cup cooked, 1 serving of nuts is  cup, and 1 serving of seeds equals 1 tablespoon.  Fats Choose healthy fats more often. Choose monounsaturated and polyunsaturated fats, such as olive and canola oils, flaxseeds,  walnuts, almonds, and seeds. Eat more omega-3 fats. Choose salmon, mackerel, sardines, tuna, flaxseed oil, and ground flaxseeds. Aim to eat fish at least 2 times each week. Check food labels carefully to identify foods with trans fats or high amounts of saturated fat. Limit saturated fats. These are found in animal products, such as meats, butter, and cream. Plant sources of saturated fats include palm oil, palm kernel oil, and coconut oil. Avoid foods with partially hydrogenated oils in them. These contain trans fats. Examples are stick margarine, some tub margarines, cookies, crackers, and other baked goods. Avoid fried foods. General information Eat more home-cooked food and less restaurant, buffet, and fast food. Limit or avoid alcohol. Limit foods that are high in starch and sugar. Lose weight if you are overweight. Losing just 5-10% of your body weight can help your overall health and prevent diseases such as diabetes and heart disease. Monitor your salt (sodium) intake, especially if you have high blood pressure. Talk with your health care provider about your sodium intake. Try to incorporate more vegetarian meals weekly. What foods can I eat? Fruits All fresh, canned (in natural juice), or frozen fruits. Vegetables Fresh or frozen vegetables (raw, steamed, roasted, or grilled). Green salads. Grains Most grains. Choose whole wheat and whole grains most of the time. Rice andpasta, including brown rice and pastas made with whole wheat. Meats and other proteins Lean, well-trimmed beef, veal, pork, and lamb. Chicken and turkey without skin. All fish and shellfish. Wild duck, rabbit, pheasant, and venison. Egg whites or low-cholesterol egg substitutes. Dried beans, peas, lentils, and tofu. Seedsand most nuts. Dairy Low-fat or nonfat cheeses, including ricotta   and mozzarella. Skim or 1% milk (liquid, powdered, or evaporated). Buttermilk made with low-fat milk. Nonfat orlow-fat yogurt. Fats  and oils Non-hydrogenated (trans-free) margarines. Vegetable oils, including soybean, sesame, sunflower, olive, peanut, safflower, corn, canola, and cottonseed. Salad dressings or mayonnaisemade with a vegetable oil. Beverages Water (mineral or sparkling). Coffee and tea. Diet carbonated beverages. Sweets and desserts Sherbet, gelatin, and fruit ice. Small amounts of dark chocolate. Limit all sweets and desserts. Seasonings and condiments All seasonings and condiments. The items listed above may not be a complete list of foods and beverages you can eat. Contact a dietitian for more options. What foods are not recommended? Fruits Canned fruit in heavy syrup. Fruit in cream or butter sauce. Fried fruit. Limitcoconut. Vegetables Vegetables cooked in cheese, cream, or butter sauce. Fried vegetables. Grains Breads made with saturated or trans fats, oils, or whole milk. Croissants. Sweet rolls. Donuts. High-fat crackers,such as cheese crackers. Meats and other proteins Fatty meats, such as hot dogs, ribs, sausage, bacon, rib-eye roast or steak. High-fat deli meats, such as salami and bologna. Caviar. Domestic duck andgoose. Organ meats, such as liver. Dairy Cream, sour cream, cream cheese, and creamed cottage cheese. Whole milk cheeses. Whole or 2% milk (liquid, evaporated, or condensed). Whole buttermilk.Cream sauce or high-fat cheese sauce. Whole-milk yogurt. Fats and oils Meat fat, or shortening. Cocoa butter, hydrogenated oils, palm oil, coconut oil, palm kernel oil. Solid fats and shortenings, including bacon fat, salt pork, lard, and butter. Nondairy cream substitutes. Salad dressings with cheeseor sour cream. Beverages Regular sodas and any drinks with added sugar. Sweets and desserts Frosting. Pudding. Cookies. Cakes. Pies. Milk chocolate or white chocolate.Buttered syrups. Full-fat ice cream or ice cream drinks. The items listed above may not be a complete list of foods and beverages to  avoid. Contact a dietitian for more information. Summary Heart-healthy meal planning includes limiting unhealthy fats, increasing healthy fats, and making other diet and lifestyle changes. Lose weight if you are overweight. Losing just 5-10% of your body weight can help your overall health and prevent diseases such as diabetes and heart disease. Focus on eating a balance of foods, including fruits and vegetables, low-fat or nonfat dairy, lean protein, nuts and legumes, whole grains, and heart-healthy oils and fats. This information is not intended to replace advice given to you by your health care provider. Make sure you discuss any questions you have with your healthcare provider. Document Revised: 04/27/2017 Document Reviewed: 04/27/2017 Elsevier Patient Education  2022 Elsevier Inc.  

## 2020-12-21 ENCOUNTER — Other Ambulatory Visit: Payer: Self-pay

## 2020-12-22 ENCOUNTER — Other Ambulatory Visit: Payer: Self-pay

## 2020-12-22 MED ORDER — FML FORTE 0.25 % OP SUSP: OPHTHALMIC | 1 refills | Status: DC

## 2020-12-23 ENCOUNTER — Other Ambulatory Visit: Payer: Self-pay

## 2020-12-24 ENCOUNTER — Other Ambulatory Visit: Payer: Self-pay

## 2020-12-24 ENCOUNTER — Encounter: Payer: Self-pay | Admitting: Oncology

## 2020-12-24 MED ORDER — PREDNISOLONE SODIUM PHOSPHATE 1 % OP SOLN
OPHTHALMIC | 1 refills | Status: DC
  Filled 2020-12-24 (×2): qty 10, 25d supply, fill #0
  Filled 2021-03-17: qty 10, 25d supply, fill #1

## 2020-12-24 MED ORDER — HYDROXYZINE HCL 25 MG PO TABS
ORAL_TABLET | ORAL | 2 refills | Status: DC
  Filled 2020-12-24: qty 60, 30d supply, fill #0
  Filled 2021-02-14: qty 60, 30d supply, fill #1
  Filled 2021-03-17: qty 60, 30d supply, fill #2

## 2020-12-24 MED ORDER — DULOXETINE HCL 30 MG PO CPEP
ORAL_CAPSULE | ORAL | 2 refills | Status: DC
  Filled 2020-12-24 – 2021-01-24 (×2): qty 90, 30d supply, fill #0
  Filled 2021-01-24: qty 90, fill #0
  Filled 2021-03-14: qty 90, 30d supply, fill #1

## 2020-12-24 MED ORDER — TRAZODONE HCL 50 MG PO TABS
ORAL_TABLET | ORAL | 2 refills | Status: DC
  Filled 2020-12-24: qty 30, 30d supply, fill #0
  Filled 2021-02-14: qty 30, 30d supply, fill #1
  Filled 2021-03-17: qty 30, 30d supply, fill #2

## 2020-12-29 ENCOUNTER — Inpatient Hospital Stay: Payer: Medicaid Other | Attending: Oncology

## 2020-12-29 ENCOUNTER — Other Ambulatory Visit: Payer: Self-pay

## 2021-01-11 ENCOUNTER — Other Ambulatory Visit: Payer: Self-pay

## 2021-01-19 ENCOUNTER — Encounter: Payer: Self-pay | Admitting: Oncology

## 2021-01-19 ENCOUNTER — Other Ambulatory Visit: Payer: Self-pay

## 2021-01-24 ENCOUNTER — Encounter: Payer: Self-pay | Admitting: Oncology

## 2021-01-24 ENCOUNTER — Other Ambulatory Visit: Payer: Self-pay

## 2021-01-26 ENCOUNTER — Other Ambulatory Visit: Payer: Medicaid Other

## 2021-01-26 ENCOUNTER — Other Ambulatory Visit: Payer: Self-pay

## 2021-01-26 DIAGNOSIS — E785 Hyperlipidemia, unspecified: Secondary | ICD-10-CM

## 2021-01-27 LAB — LIPID PANEL
Chol/HDL Ratio: 2.9 ratio (ref 0.0–4.4)
Cholesterol, Total: 216 mg/dL — ABNORMAL HIGH (ref 100–199)
HDL: 74 mg/dL (ref >39)
LDL Chol Calc (NIH): 133 mg/dL — ABNORMAL HIGH (ref 0–99)
Triglycerides: 50 mg/dL (ref 0–149)
VLDL Cholesterol Cal: 9 mg/dL (ref 5–40)

## 2021-02-08 ENCOUNTER — Ambulatory Visit: Payer: Medicaid Other | Admitting: Gerontology

## 2021-02-11 ENCOUNTER — Inpatient Hospital Stay: Payer: Medicaid Other | Attending: Oncology

## 2021-02-11 ENCOUNTER — Other Ambulatory Visit: Payer: Self-pay

## 2021-02-11 DIAGNOSIS — E538 Deficiency of other specified B group vitamins: Secondary | ICD-10-CM | POA: Insufficient documentation

## 2021-02-11 DIAGNOSIS — D509 Iron deficiency anemia, unspecified: Secondary | ICD-10-CM

## 2021-02-11 LAB — CBC WITH DIFFERENTIAL/PLATELET
Abs Immature Granulocytes: 0 10*3/uL (ref 0.00–0.07)
Basophils Absolute: 0 10*3/uL (ref 0.0–0.1)
Basophils Relative: 1 %
Eosinophils Absolute: 0.1 10*3/uL (ref 0.0–0.5)
Eosinophils Relative: 4 %
HCT: 40.1 % (ref 36.0–46.0)
Hemoglobin: 13 g/dL (ref 12.0–15.0)
Immature Granulocytes: 0 %
Lymphocytes Relative: 38 %
Lymphs Abs: 1.5 10*3/uL (ref 0.7–4.0)
MCH: 25.7 pg — ABNORMAL LOW (ref 26.0–34.0)
MCHC: 32.4 g/dL (ref 30.0–36.0)
MCV: 79.4 fL — ABNORMAL LOW (ref 80.0–100.0)
Monocytes Absolute: 0.3 10*3/uL (ref 0.1–1.0)
Monocytes Relative: 8 %
Neutro Abs: 1.9 10*3/uL (ref 1.7–7.7)
Neutrophils Relative %: 49 %
Platelets: 283 10*3/uL (ref 150–400)
RBC: 5.05 MIL/uL (ref 3.87–5.11)
RDW: 14.4 % (ref 11.5–15.5)
WBC: 3.9 10*3/uL — ABNORMAL LOW (ref 4.0–10.5)
nRBC: 0 % (ref 0.0–0.2)

## 2021-02-11 LAB — IRON AND TIBC
Iron: 63 ug/dL (ref 28–170)
Saturation Ratios: 19 % (ref 10.4–31.8)
TIBC: 333 ug/dL (ref 250–450)
UIBC: 270 ug/dL

## 2021-02-11 LAB — VITAMIN B12: Vitamin B-12: 438 pg/mL (ref 180–914)

## 2021-02-11 LAB — FERRITIN: Ferritin: 107 ng/mL (ref 11–307)

## 2021-02-14 ENCOUNTER — Other Ambulatory Visit: Payer: Self-pay

## 2021-02-14 ENCOUNTER — Encounter: Payer: Self-pay | Admitting: Oncology

## 2021-02-15 ENCOUNTER — Inpatient Hospital Stay: Payer: Medicaid Other

## 2021-02-15 ENCOUNTER — Other Ambulatory Visit: Payer: Self-pay

## 2021-02-15 ENCOUNTER — Inpatient Hospital Stay (HOSPITAL_BASED_OUTPATIENT_CLINIC_OR_DEPARTMENT_OTHER): Payer: Self-pay | Admitting: Oncology

## 2021-02-15 VITALS — BP 142/86 | HR 60 | Temp 97.0°F | Resp 16 | Wt 181.0 lb

## 2021-02-15 DIAGNOSIS — E538 Deficiency of other specified B group vitamins: Secondary | ICD-10-CM

## 2021-02-15 DIAGNOSIS — D509 Iron deficiency anemia, unspecified: Secondary | ICD-10-CM

## 2021-02-15 NOTE — Progress Notes (Signed)
Hemoglobin; hoping to be in a good place due to numbers; heavy bleeding has stopped' experiencing hot flashes. has not had her menstrual in two months. Is she still needing IV Otelia Sergeant? Being followed at [facility] for fibroids new imaging has been scheduled.

## 2021-02-16 ENCOUNTER — Encounter: Payer: Self-pay | Admitting: Oncology

## 2021-02-16 NOTE — Progress Notes (Signed)
Hematology/Oncology Consult note [facility]  Telephone:([phone] [phone]  Patient Care Team: Dana Reusing, NP as PCP - General (Gerontology) Dana Shiley, MD as Consulting Physician (Internal Medicine) Dana Junker, RN as Registered Nurse Dana Demark, RN as Registered Nurse Dana Guadeloupe, MD as Consulting Physician (Oncology)   Name of the patient: Dana Dunlap  [id number]  Aug 31, 1970   Date of visit: 02/16/21  Diagnosis-iron deficiency anemia  Chief complaint/ Reason for visit-routine follow-up of iron deficiency anemia  Heme/Onc history: Patient is a 50 year old African-American female who was seen by Dana Dunlap for iron deficiency anemia and has required IV iron in the past.  She is transferring her care to me.  Etiology of iron deficiency anemia has been related to be heavy menstrual bleeding secondary to fibroids.  Patient also has history of thrombocytosis which has been attributed to iron deficiency.  Patient has concerns that she has sickle cell disease but on hemoglobinopathy evaluation was normal in 2018.  Patient has not had any menstrual cycles since May 2022.  She does have a history of uterine fibroids but has not required any surgical intervention for it yet.  Interval history-overall reports feeling well.  She was seen by a fibroids specialist at [facility] and will be undergoing more imaging.  Plan is to hold off on surgery for now.  She has not had any menstrual cycles for the last 4 to 5 months.  ECOG PS- 1 Pain scale- 0  Review of systems- Review of Systems  Constitutional:  Positive for malaise/fatigue. Negative for chills, fever and weight loss.  HENT:  Negative for congestion, ear discharge and nosebleeds.   Eyes:  Negative for blurred vision.  Respiratory:  Negative for cough, hemoptysis, sputum production, shortness of breath and wheezing.   Cardiovascular:  Negative for chest pain, palpitations,  orthopnea and claudication.  Gastrointestinal:  Negative for abdominal pain, blood in stool, constipation, diarrhea, heartburn, melena, nausea and vomiting.  Genitourinary:  Negative for dysuria, flank pain, frequency, hematuria and urgency.  Musculoskeletal:  Negative for back pain, joint pain and myalgias.  Skin:  Negative for rash.  Neurological:  Negative for dizziness, tingling, focal weakness, seizures, weakness and headaches.  Endo/Heme/Allergies:  Does not bruise/bleed easily.  Psychiatric/Behavioral:  Negative for depression and suicidal ideas. The patient does not have insomnia.      Allergies  Allergen Reactions   Prochlorperazine Anaphylaxis and Other (See Comments)    Seizure Seizure Other reaction(s): Other (See Comments), Other (See Comments), Other (See Comments) Seizure Seizure Seizure Seizure   Tramadol Hives   Compazine [Prochlorperazine Edisylate] Other (See Comments)    Seizure    Other     Sneezing and red eyes     Past Medical History:  Diagnosis Date   Anemia    Depression    Currently on Cymbalta   Eye abnormalities    Fibroids    Frequent headaches    Worse during monthly menstrual cycle   Hypertension      Past Surgical History:  Procedure Laterality Date   BREAST MASS EXCISION     benign   KNEE SURGERY      Social History   Socioeconomic History   Marital status: Single    Spouse name: Not on file   Number of children: 0   Years of education: 16   Highest education level: Bachelor's degree (e.g., BA, AB, BS)  Occupational History   Occupation: Editor, commissioning  Employer: [employer]    Comment: Molecular biology  Tobacco Use   Smoking status: Never   Smokeless tobacco: Never  Vaping Use   Vaping Use: Never used  Substance and Sexual Activity   Alcohol use: Yes    Alcohol/week: 1.0 standard drink    Types: 1 Glasses of wine per week   Drug use: No   Sexual activity: Yes    Birth control/protection: Condom  Other Topics  Concern   Not on file  Social History Narrative   Dana Dunlap is originally from [place]. She attended [employer] in [place] where she obtained her Therapist, nutritional in Biology in 1995. 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 She is in a long term relationship with her boyfried, Dana Dunlap. They have been together for 6 years. Dana Dunlap transferred to [facility] with [place] in January. She enjoys reading and she enjoys the outdoors. She loves to travel. 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   Social Determinants of Health   Financial Resource Strain: Not on file  Food Insecurity: No Food Insecurity   Worried About Charity fundraiser in the Last Year: Never true   Ran Out of Food in the Last Year: Never true  Transportation Needs: No Transportation Needs   Lack of Transportation (Medical): No   Lack of Transportation (Non-Medical): No  Physical Activity: Not on file  Stress: Not on file  Social Connections: Not on file  Intimate Partner Violence: Not on file    Family History  Problem Relation Age of Onset   Hypertension Mother    Hyperlipidemia Mother    Stroke Mother    Cancer Father        Prostate cancer   Hypertension Father    Hyperlipidemia Father    Fibroids Sister    Sickle cell trait Sister    Sickle cell trait Brother    Fibroids Sister    Stroke Paternal Aunt    Diabetes Paternal Aunt    Sickle cell trait Other    Thalassemia Other      Current Outpatient Medications:    acetaminophen (TYLENOL) 325 MG tablet, Take 650 mg by mouth every 6 (six) hours as needed. Patient taking twice weekly, Disp: , Rfl:    amLODipine (NORVASC) 5 MG tablet, Take 1 tablet (5 mg total) by mouth once daily., Disp: 30 tablet, Rfl: 2   atorvastatin (LIPITOR) 10 MG tablet, Take 1 tablet (10 mg total) by mouth once daily., Disp: 30 tablet, Rfl: 2   DULoxetine (CYMBALTA) 30 MG capsule, TAKE ONE CAPSULE  BY MOUTH THREE TIMES DAILY., Disp: 90 capsule, Rfl: 2   ferrous sulfate 325 (65 FE) MG tablet, TAKE ONE TABLET BY MOUTH EVERY DAY WTH BREAKFAST, Disp: 60 tablet, Rfl: 1   fluticasone (FLONASE) 50 MCG/ACT nasal spray, PLACE 1 SPRAY INTO BOTH NOSTRILS ONCE DAILY, Disp: 16 g, Rfl: 2   hydrOXYzine (ATARAX/VISTARIL) 25 MG tablet, TAKE ONE TABLET BY MOUTH TWICE DAILY AS NEEDED., Disp: 60 tablet, Rfl: 2   Multiple Vitamin (MULTIVITAMIN) capsule, Take 1 capsule by mouth daily., Disp: 30 capsule, Rfl: 1   prednisoLONE sodium phosphate (INFLAMASE FORTE) 1 % ophthalmic solution, Administer 1 drop into both eyes four times daily., Disp: 10 mL, Rfl: 1   sodium chloride (OCEAN) 0.65 % SOLN nasal spray, Place 1 spray into both nostrils as needed for congestion., Disp: 30 mL, Rfl: 0  traZODone (DESYREL) 50 MG tablet, TAKE ONE TABLET BY MOUTH ONCE DAILY AT BEDTIME AS NEEDED., Disp: 30 tablet, Rfl: 2   Vitamin D, Ergocalciferol, (DRISDOL) 1.25 MG (50000 UNIT) CAPS capsule, Take 1 capsule (50,000 Units total) by mouth every 7 (seven) days., Disp: 12 capsule, Rfl: 0  Physical exam:  Vitals:   02/15/21 1423  BP: (!) 142/86  Pulse: 60  Resp: 16  Temp: (!) 97 F (36.1 C)  SpO2: 100%  Weight: 181 lb (82.1 kg)   Physical Exam Constitutional:      General: She is not in acute distress. Cardiovascular:     Rate and Rhythm: Normal rate and regular rhythm.     Heart sounds: Normal heart sounds.  Pulmonary:     Effort: Pulmonary effort is normal.     Breath sounds: Normal breath sounds.  Skin:    General: Skin is warm and dry.  Neurological:     Mental Status: She is alert and oriented to person, place, and time.     CMP Latest Ref Rng & Units 05/26/2020  Glucose 70 - 99 mg/dL -  BUN 6 - 20 mg/dL -  Creatinine 0.44 - 1.00 mg/dL -  Sodium 135 - 145 mmol/L -  Potassium 3.5 - 5.1 mmol/L -  Chloride 98 - 111 mmol/L -  CO2 22 - 32 mmol/L -  Calcium 8.9 - 10.3 mg/dL -  Total Protein 6.5 - 8.1 g/dL -  Total  Bilirubin 0.3 - 1.2 mg/dL 0.2(L)  Alkaline Phos 38 - 126 U/L -  AST 15 - 41 U/L -  ALT 0 - 44 U/L -   CBC Latest Ref Rng & Units 02/11/2021  WBC 4.0 - 10.5 K/uL 3.9(L)  Hemoglobin 12.0 - 15.0 g/dL 13.0  Hematocrit 36.0 - 46.0 % 40.1  Platelets 150 - 400 K/uL 283    Assessment and plan- Patient is a 50 y.o. female with history of iron deficiency anemia here for routine follow-up  Patient's hemoglobin is remained stable between 12-13 since June 2022.  She has not had any return of menstrual cycles in the last 4 to 5 months.  Her iron studies are presently normal and she does not require any IV iron at this time.  Given the stability of her hemoglobin level monitor CBC ferritin and iron studies in 6 months in 1 year and see her back in 1 year   Visit Diagnosis 1. B12 deficiency   2. Iron deficiency anemia, unspecified iron deficiency anemia type      Dr. Randa Evens, MD, MPH [facility] at [facility] [phone] 02/16/2021 8:35 AM

## 2021-03-01 ENCOUNTER — Ambulatory Visit: Payer: Medicaid Other

## 2021-03-01 DIAGNOSIS — Z79899 Other long term (current) drug therapy: Secondary | ICD-10-CM

## 2021-03-01 NOTE — Progress Notes (Signed)
  Medication Management Clinic Visit Note  Patient: Dana Dunlap MRN: [id number] Date of Birth: 05-Apr-1970 PCP: Langston Reusing, NP   Warner Mccreedy 50 y.o. female presents for a yearly MTM visit today. Patient identified with two patient identifiers (name and DOB)  There were no vitals taken for this visit.  Patient Information   Past Medical History:  Diagnosis Date   Anemia    Depression    Currently on Cymbalta   Eye abnormalities    Fibroids    Frequent headaches    Worse during monthly menstrual cycle   Hypertension       Past Surgical History:  Procedure Laterality Date   BREAST MASS EXCISION     benign   KNEE SURGERY       Family History  Problem Relation Age of Onset   Hypertension Mother    Hyperlipidemia Mother    Stroke Mother    Cancer Father        Prostate cancer   Hypertension Father    Hyperlipidemia Father    Fibroids Sister    Sickle cell trait Sister    Sickle cell trait Brother    Fibroids Sister    Stroke Paternal Aunt    Diabetes Paternal Aunt    Sickle cell trait Other    Thalassemia Other     New Diagnoses (since last visit):            Social History   Substance and Sexual Activity  Alcohol Use Yes   Alcohol/week: 1.0 standard drink   Types: 1 Glasses of wine per week      Social History   Tobacco Use  Smoking Status Never  Smokeless Tobacco Never      Health Maintenance  Topic Date Due   Pneumococcal Vaccine 50-39 Years old (1 - PCV) Never done   Hepatitis C Screening  Never done   Zoster Vaccines- Shingrix (1 of 2) Never done   COLONOSCOPY (Pts 45-62yrs Insurance coverage will need to be confirmed)  Never done   PAP SMEAR-Modifier  11/12/2017   COVID-19 Vaccine (3 - Pfizer risk series) 11/04/2019   INFLUENZA VACCINE  Never done   MAMMOGRAM  06/17/2022   TETANUS/TDAP  11/12/2022   HIV Screening  Completed   HPV VACCINES  Aged Out   Health Maintenance/Date Completed  Last ED visit:  03/28/2020 Last Visit to PCP: 12/07/2020 Next Visit to PCP: December 2022 Specialist Visit: 01/20/2021  Assessment and Plan: HTN --Currently receiving amlodipine and reports no issues  --Does not currently monitor blood pressure at home --Patient reported blood pressure readings as follow-up appointments have been elevated, advised patient to bring concerns up at next [facility] appointment and [facility] could fill a prescription for blood pressure monitor  HLD --Currently receiving atorvastatin and reports no issues --Last LDL 133 01/26/2021 Anemia  --Currently receiving ferrous sulfate and reports no issues  Allergies  --Currently receiving Flonase and reports no issues  Depression/anxiety  --Currently receiving duloxetine, as needed hydroxyzine and as needed trazodone and reports no issues   Provided medication counseling and found no interacting medications. Provided information on [facility] suppyling blood pressure cuff if Herman prescribes it. Patient was agreeable to all suggestions. Plan to return to clinic in 1 year for annual MTM.  RTC: 1 year  Narda Rutherford, PharmD Pharmacy Resident  03/01/2021 4:07 PM

## 2021-03-03 ENCOUNTER — Other Ambulatory Visit: Payer: Medicaid Other

## 2021-03-03 ENCOUNTER — Other Ambulatory Visit: Payer: Self-pay | Admitting: Gerontology

## 2021-03-07 ENCOUNTER — Other Ambulatory Visit: Payer: Self-pay

## 2021-03-08 ENCOUNTER — Other Ambulatory Visit: Payer: Self-pay

## 2021-03-08 ENCOUNTER — Other Ambulatory Visit: Payer: Self-pay | Admitting: Gerontology

## 2021-03-08 DIAGNOSIS — I10 Essential (primary) hypertension: Secondary | ICD-10-CM

## 2021-03-08 MED ORDER — BLOOD PRESSURE KIT DEVI
1.0000 | Freq: Every day | 0 refills | Status: DC
  Filled 2021-03-08 – 2021-03-18 (×3): qty 1, fill #0
  Filled 2021-04-13: qty 1, 30d supply, fill #0
  Filled 2022-02-21: qty 1, fill #0

## 2021-03-09 ENCOUNTER — Other Ambulatory Visit: Payer: Self-pay

## 2021-03-10 ENCOUNTER — Other Ambulatory Visit: Payer: Self-pay

## 2021-03-11 ENCOUNTER — Other Ambulatory Visit: Payer: Self-pay | Admitting: Gerontology

## 2021-03-11 ENCOUNTER — Other Ambulatory Visit: Payer: Self-pay

## 2021-03-11 DIAGNOSIS — E785 Hyperlipidemia, unspecified: Secondary | ICD-10-CM

## 2021-03-11 MED FILL — Atorvastatin Calcium Tab 10 MG (Base Equivalent): ORAL | 90 days supply | Qty: 90 | Fill #0 | Status: AC

## 2021-03-14 ENCOUNTER — Other Ambulatory Visit: Payer: Self-pay

## 2021-03-14 ENCOUNTER — Encounter: Payer: Self-pay | Admitting: Oncology

## 2021-03-14 MED FILL — Ferrous Sulfate Tab 325 MG (65 MG Elemental Fe): ORAL | 90 days supply | Qty: 90 | Fill #0 | Status: AC

## 2021-03-15 ENCOUNTER — Other Ambulatory Visit: Payer: Self-pay

## 2021-03-16 ENCOUNTER — Other Ambulatory Visit: Payer: Self-pay

## 2021-03-17 ENCOUNTER — Other Ambulatory Visit: Payer: Self-pay

## 2021-03-17 ENCOUNTER — Encounter: Payer: Self-pay | Admitting: Oncology

## 2021-03-17 ENCOUNTER — Encounter: Payer: Self-pay | Admitting: Gerontology

## 2021-03-17 ENCOUNTER — Ambulatory Visit: Payer: Medicaid Other | Admitting: Gerontology

## 2021-03-17 VITALS — BP 131/83 | HR 78 | Temp 97.6°F | Resp 16 | Ht 60.0 in | Wt 183.9 lb

## 2021-03-17 DIAGNOSIS — E785 Hyperlipidemia, unspecified: Secondary | ICD-10-CM

## 2021-03-17 DIAGNOSIS — I10 Essential (primary) hypertension: Secondary | ICD-10-CM

## 2021-03-17 MED ORDER — ATORVASTATIN CALCIUM 10 MG PO TABS
10.0000 mg | ORAL_TABLET | Freq: Every day | ORAL | 2 refills | Status: DC
  Filled 2021-03-17 – 2021-06-13 (×3): qty 90, 90d supply, fill #0
  Filled 2021-09-26: qty 90, 90d supply, fill #1
  Filled 2021-09-26: qty 90, 90d supply, fill #0
  Filled 2022-02-21: qty 90, 90d supply, fill #1

## 2021-03-17 MED ORDER — AMLODIPINE BESYLATE 5 MG PO TABS
5.0000 mg | ORAL_TABLET | Freq: Every day | ORAL | 2 refills | Status: DC
  Filled 2021-03-17 – 2021-04-11 (×3): qty 90, 90d supply, fill #0
  Filled 2021-07-07: qty 90, 90d supply, fill #1
  Filled 2022-02-21: qty 90, 90d supply, fill #0

## 2021-03-17 NOTE — Patient Instructions (Signed)
Heart-Healthy Eating Plan Many factors influence your heart (coronary) health, including eating and exercise habits. Coronary risk increases with abnormal blood fat (lipid) levels. Heart-healthy meal planning includes limiting unhealthy fats, increasing healthy fats, and making other diet and lifestyle changes. What is my plan? Your health care provider may recommend that you: Limit your fat intake to _________% or less of your total calories each day. Limit your saturated fat intake to _________% or less of your total calories each day. Limit the amount of cholesterol in your diet to less than _________ mg per day. What are tips for following this plan? Cooking Cook foods using methods other than frying. Baking, boiling, grilling, and broiling are all good options. Other ways to reduce fat include: Removing the skin from poultry. Removing all visible fats from meats. Steaming vegetables in water or broth. Meal planning  At meals, imagine dividing your plate into fourths: Fill one-half of your plate with vegetables and green salads. Fill one-fourth of your plate with whole grains. Fill one-fourth of your plate with lean protein foods. Eat 4-5 servings of vegetables per day. One serving equals 1 cup raw or cooked vegetable, or 2 cups raw leafy greens. Eat 4-5 servings of fruit per day. One serving equals 1 medium whole fruit,  cup dried fruit,  cup fresh, frozen, or canned fruit, or  cup 100% fruit juice. Eat more foods that contain soluble fiber. Examples include apples, broccoli, carrots, beans, peas, and barley. Aim to get 25-30 g of fiber per day. Increase your consumption of legumes, nuts, and seeds to 4-5 servings per week. One serving of dried beans or legumes equals  cup cooked, 1 serving of nuts is  cup, and 1 serving of seeds equals 1 tablespoon. Fats Choose healthy fats more often. Choose monounsaturated and polyunsaturated fats, such as olive and canola oils, flaxseeds,  walnuts, almonds, and seeds. Eat more omega-3 fats. Choose salmon, mackerel, sardines, tuna, flaxseed oil, and ground flaxseeds. Aim to eat fish at least 2 times each week. Check food labels carefully to identify foods with trans fats or high amounts of saturated fat. Limit saturated fats. These are found in animal products, such as meats, butter, and cream. Plant sources of saturated fats include palm oil, palm kernel oil, and coconut oil. Avoid foods with partially hydrogenated oils in them. These contain trans fats. Examples are stick margarine, some tub margarines, cookies, crackers, and other baked goods. Avoid fried foods. General information Eat more home-cooked food and less restaurant, buffet, and fast food. Limit or avoid alcohol. Limit foods that are high in starch and sugar. Lose weight if you are overweight. Losing just 5-10% of your body weight can help your overall health and prevent diseases such as diabetes and heart disease. Monitor your salt (sodium) intake, especially if you have high blood pressure. Talk with your health care provider about your sodium intake. Try to incorporate more vegetarian meals weekly. What foods can I eat? Fruits All fresh, canned (in natural juice), or frozen fruits. Vegetables Fresh or frozen vegetables (raw, steamed, roasted, or grilled). Green salads. Grains Most grains. Choose whole wheat and whole grains most of the time. Rice and pasta, including brown rice and pastas made with whole wheat. Meats and other proteins Lean, well-trimmed beef, veal, pork, and lamb. Chicken and turkey without skin. All fish and shellfish. Wild duck, rabbit, pheasant, and venison. Egg whites or low-cholesterol egg substitutes. Dried beans, peas, lentils, and tofu. Seeds and most nuts. Dairy Low-fat or nonfat cheeses,   including ricotta and mozzarella. Skim or 1% milk (liquid, powdered, or evaporated). Buttermilk made with low-fat milk. Nonfat or low-fat  yogurt. Fats and oils Non-hydrogenated (trans-free) margarines. Vegetable oils, including soybean, sesame, sunflower, olive, peanut, safflower, corn, canola, and cottonseed. Salad dressings or mayonnaise made with a vegetable oil. Beverages Water (mineral or sparkling). Coffee and tea. Diet carbonated beverages. Sweets and desserts Sherbet, gelatin, and fruit ice. Small amounts of dark chocolate. Limit all sweets and desserts. Seasonings and condiments All seasonings and condiments. The items listed above may not be a complete list of foods and beverages you can eat. Contact a dietitian for more options. What foods are not recommended? Fruits Canned fruit in heavy syrup. Fruit in cream or butter sauce. Fried fruit. Limit coconut. Vegetables Vegetables cooked in cheese, cream, or butter sauce. Fried vegetables. Grains Breads made with saturated or trans fats, oils, or whole milk. Croissants. Sweet rolls. Donuts. High-fat crackers, such as cheese crackers. Meats and other proteins Fatty meats, such as hot dogs, ribs, sausage, bacon, rib-eye roast or steak. High-fat deli meats, such as salami and bologna. Caviar. Domestic duck and goose. Organ meats, such as liver. Dairy Cream, sour cream, cream cheese, and creamed cottage cheese. Whole-milk cheeses. Whole or 2% milk (liquid, evaporated, or condensed). Whole buttermilk. Cream sauce or high-fat cheese sauce. Whole-milk yogurt. Fats and oils Meat fat, or shortening. Cocoa butter, hydrogenated oils, palm oil, coconut oil, palm kernel oil. Solid fats and shortenings, including bacon fat, salt pork, lard, and butter. Nondairy cream substitutes. Salad dressings with cheese or sour cream. Beverages Regular sodas and any drinks with added sugar. Sweets and desserts Frosting. Pudding. Cookies. Cakes. Pies. Milk chocolate or white chocolate. Buttered syrups. Full-fat ice cream or ice cream drinks. The items listed above may not be a complete list of  foods and beverages to avoid. Contact a dietitian for more information. Summary Heart-healthy meal planning includes limiting unhealthy fats, increasing healthy fats, and making other diet and lifestyle changes. Lose weight if you are overweight. Losing just 5-10% of your body weight can help your overall health and prevent diseases such as diabetes and heart disease. Focus on eating a balance of foods, including fruits and vegetables, low-fat or nonfat dairy, lean protein, nuts and legumes, whole grains, and heart-healthy oils and fats. This information is not intended to replace advice given to you by your health care provider. Make sure you discuss any questions you have with your health care provider. Document Revised: 07/29/2020 Document Reviewed: 07/29/2020 Elsevier Patient Education  2022 [place] Eating Plan DASH stands for Dietary Approaches to Stop Hypertension. The DASH eating plan is a healthy eating plan that has been shown to: Reduce high blood pressure (hypertension). Reduce your risk for type 2 diabetes, heart disease, and stroke. Help with weight loss. What are tips for following this plan? Reading food labels Check food labels for the amount of salt (sodium) per serving. Choose foods with less than 5 percent of the Daily Value of sodium. Generally, foods with less than 300 milligrams (mg) of sodium per serving fit into this eating plan. To find whole grains, look for the word "whole" as the first word in the ingredient list. Shopping Buy products labeled as "low-sodium" or "no salt added." Buy fresh foods. Avoid canned foods and pre-made or frozen meals. Cooking Avoid adding salt when cooking. Use salt-free seasonings or herbs instead of table salt or sea salt. Check with your health care provider or pharmacist before using salt substitutes.  Do not fry foods. Cook foods using healthy methods such as baking, boiling, grilling, roasting, and broiling instead. Cook  with heart-healthy oils, such as olive, canola, avocado, soybean, or sunflower oil. Meal planning  Eat a balanced diet that includes: 4 or more servings of fruits and 4 or more servings of vegetables each day. Try to fill one-half of your plate with fruits and vegetables. 6-8 servings of whole grains each day. Less than 6 oz (170 g) of lean meat, poultry, or fish each day. A 3-oz (85-g) serving of meat is about the same size as a deck of cards. One egg equals 1 oz (28 g). 2-3 servings of low-fat dairy each day. One serving is 1 cup (237 mL). 1 serving of nuts, seeds, or beans 5 times each week. 2-3 servings of heart-healthy fats. Healthy fats called omega-3 fatty acids are found in foods such as walnuts, flaxseeds, fortified milks, and eggs. These fats are also found in cold-water fish, such as sardines, salmon, and mackerel. Limit how much you eat of: Canned or prepackaged foods. Food that is high in trans fat, such as some fried foods. Food that is high in saturated fat, such as fatty meat. Desserts and other sweets, sugary drinks, and other foods with added sugar. Full-fat dairy products. Do not salt foods before eating. Do not eat more than 4 egg yolks a week. Try to eat at least 2 vegetarian meals a week. Eat more home-cooked food and less restaurant, buffet, and fast food. Lifestyle When eating at a restaurant, ask that your food be prepared with less salt or no salt, if possible. If you drink alcohol: Limit how much you use to: 0-1 drink a day for women who are not pregnant. 0-2 drinks a day for men. Be aware of how much alcohol is in your drink. In the [place] one drink equals one 12 oz bottle of beer (355 mL), one 5 oz glass of wine (148 mL), or one 1 oz glass of hard liquor (44 mL). General information Avoid eating more than 2,300 mg of salt a day. If you have hypertension, you may need to reduce your sodium intake to 1,500 mg a day. Work with your health care provider to  maintain a healthy body weight or to lose weight. Ask what an ideal weight is for you. Get at least 30 minutes of exercise that causes your heart to beat faster (aerobic exercise) most days of the week. Activities may include walking, swimming, or biking. Work with your health care provider or dietitian to adjust your eating plan to your individual calorie needs. What foods should I eat? Fruits All fresh, dried, or frozen fruit. Canned fruit in natural juice (without added sugar). Vegetables Fresh or frozen vegetables (raw, steamed, roasted, or grilled). Low-sodium or reduced-sodium tomato and vegetable juice. Low-sodium or reduced-sodium tomato sauce and tomato paste. Low-sodium or reduced-sodium canned vegetables. Grains Whole-grain or whole-wheat bread. Whole-grain or whole-wheat pasta. Brown rice. [place]. Bulgur. Whole-grain and low-sodium cereals. Pita bread. Low-fat, low-sodium crackers. Whole-wheat flour tortillas. Meats and other proteins Skinless chicken or [place]. Ground chicken or [place]. Pork with fat trimmed off. Fish and seafood. Egg whites. Dried beans, peas, or lentils. Unsalted nuts, nut butters, and seeds. Unsalted canned beans. Lean cuts of beef with fat trimmed off. Low-sodium, lean precooked or cured meat, such as sausages or meat loaves. Dairy Low-fat (1%) or fat-free (skim) milk. Reduced-fat, low-fat, or fat-free cheeses. Nonfat, low-sodium ricotta or cottage cheese. Low-fat or nonfat yogurt.  Low-fat, low-sodium cheese. Fats and oils Soft margarine without trans fats. Vegetable oil. Reduced-fat, low-fat, or light mayonnaise and salad dressings (reduced-sodium). Canola, safflower, olive, avocado, soybean, and sunflower oils. Avocado. Seasonings and condiments Herbs. Spices. Seasoning mixes without salt. Other foods Unsalted popcorn and pretzels. Fat-free sweets. The items listed above may not be a complete list of foods and beverages you can eat. Contact a dietitian  for more information. What foods should I avoid? Fruits Canned fruit in a light or heavy syrup. Fried fruit. Fruit in cream or butter sauce. Vegetables Creamed or fried vegetables. Vegetables in a cheese sauce. Regular canned vegetables (not low-sodium or reduced-sodium). Regular canned tomato sauce and paste (not low-sodium or reduced-sodium). Regular tomato and vegetable juice (not low-sodium or reduced-sodium). Angie Fava. Olives. Grains Baked goods made with fat, such as croissants, muffins, or some breads. Dry pasta or rice meal packs. Meats and other proteins Fatty cuts of meat. Ribs. Fried meat. Berniece Salines. Bologna, salami, and other precooked or cured meats, such as sausages or meat loaves. Fat from the back of a pig (fatback). Bratwurst. Salted nuts and seeds. Canned beans with added salt. Canned or smoked fish. Whole eggs or egg yolks. Chicken or [place] with skin. Dairy Whole or 2% milk, cream, and half-and-half. Whole or full-fat cream cheese. Whole-fat or sweetened yogurt. Full-fat cheese. Nondairy creamers. Whipped toppings. Processed cheese and cheese spreads. Fats and oils Butter. Stick margarine. Lard. Shortening. Ghee. Bacon fat. Tropical oils, such as coconut, palm kernel, or palm oil. Seasonings and condiments Onion salt, garlic salt, seasoned salt, table salt, and sea salt. Worcestershire sauce. Tartar sauce. Barbecue sauce. Teriyaki sauce. Soy sauce, including reduced-sodium. Steak sauce. Canned and packaged gravies. Fish sauce. Oyster sauce. Cocktail sauce. Store-bought horseradish. Ketchup. Mustard. Meat flavorings and tenderizers. Bouillon cubes. Hot sauces. Pre-made or packaged marinades. Pre-made or packaged taco seasonings. Relishes. Regular salad dressings. Other foods Salted popcorn and pretzels. The items listed above may not be a complete list of foods and beverages you should avoid. Contact a dietitian for more information. Where to find more information National Heart,  Lung, and Blood Institute: https://wilson-eaton.com/ American Heart Association: www.heart.org Academy of Nutrition and Dietetics: www.eatright.[place]: www.kidney.org Summary The DASH eating plan is a healthy eating plan that has been shown to reduce high blood pressure (hypertension). It may also reduce your risk for type 2 diabetes, heart disease, and stroke. When on the DASH eating plan, aim to eat more fresh fruits and vegetables, whole grains, lean proteins, low-fat dairy, and heart-healthy fats. With the DASH eating plan, you should limit salt (sodium) intake to 2,300 mg a day. If you have hypertension, you may need to reduce your sodium intake to 1,500 mg a day. Work with your health care provider or dietitian to adjust your eating plan to your individual calorie needs. This information is not intended to replace advice given to you by your health care provider. Make sure you discuss any questions you have with your health care provider. Document Revised: 02/21/2019 Document Reviewed: 02/21/2019 Elsevier Patient Education  2022 [employer].

## 2021-03-17 NOTE — Progress Notes (Signed)
Established Patient Office Visit  Subjective:  Patient ID: Dana Dunlap, female    DOB: 07-27-1970  Age: 50 y.o. MRN: [id number]  CC:  Chief Complaint  Patient presents with   Follow-up    HPI  Dana Dunlap is a 50 year old female who has history of depression, eye abnormality, fibroid, hypertension,presents for routine follow up and medication refill. She has established care at [facility] on 03/10/21 and this will be her last visit. She states that she's compliant with her medications, denies side effects and continues to make healthy life style changes. She had Endovaginal ultrasound done at [facility] on 03/16/21, showed numerous fibroids. Overall, she states that she's doing well and offers no further complaint.  Past Medical History:  Diagnosis Date   Anemia    Depression    Currently on Cymbalta   Eye abnormalities    Fibroids    Frequent headaches    Worse during monthly menstrual cycle   Hypertension     Past Surgical History:  Procedure Laterality Date   BREAST MASS EXCISION     benign   KNEE SURGERY      Family History  Problem Relation Age of Onset   Hypertension Mother    Hyperlipidemia Mother    Stroke Mother    Cancer Father        Prostate cancer   Hypertension Father    Hyperlipidemia Father    Fibroids Sister    Sickle cell trait Sister    Sickle cell trait Brother    Fibroids Sister    Stroke Paternal Aunt    Diabetes Paternal Aunt    Sickle cell trait Other    Thalassemia Other     Social History   Socioeconomic History   Marital status: Single    Spouse name: Not on file   Number of children: 0   Years of education: 16   Highest education level: Bachelor's degree (e.g., BA, AB, BS)  Occupational History   Occupation: Psychologist, clinical: [employer]    Comment: Molecular biology  Tobacco Use   Smoking status: Never   Smokeless tobacco: Never  Vaping Use   Vaping Use: Never used  Substance and Sexual Activity    Alcohol use: Yes    Alcohol/week: 1.0 standard drink    Types: 1 Glasses of wine per week   Drug use: No   Sexual activity: Yes    Birth control/protection: Condom  Other Topics Concern   Not on file  Social History Narrative   Dana Dunlap is originally from [place]. She attended [employer] in [place] where she obtained her Therapist, nutritional in Biology in 1995. 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 She is in a long term relationship with her boyfried, [name]. They have been together for 6 years. Dana Dunlap transferred to [facility] with [facility] in January. She enjoys reading and she enjoys the outdoors. She loves to travel. 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   Social Determinants of Health   Financial Resource Strain: Not on file  Food Insecurity: No Food Insecurity   Worried About Charity fundraiser in the Last Year: Never true   Ran Out of Food in the Last Year: Never true  Transportation Needs: No Transportation Needs   Lack of Transportation (Medical): No   Lack of Transportation (Non-Medical): No  Physical Activity: Not  on file  Stress: Not on file  Social Connections: Not on file  Intimate Partner Violence: Not on file    Outpatient Medications Prior to Visit  Medication Sig Dispense Refill   acetaminophen (TYLENOL) 325 MG tablet Take 650 mg by mouth every 6 (six) hours as needed. Patient taking twice weekly     amLODipine (NORVASC) 5 MG tablet Take 1 tablet (5 mg total) by mouth once daily. 30 tablet 2   atorvastatin (LIPITOR) 10 MG tablet Take 1 tablet (10 mg total) by mouth once daily. 30 tablet 2   Blood Pressure Monitoring (BLOOD PRESSURE KIT) DEVI Use as directed to check blood pressure. 1 each 0   DULoxetine (CYMBALTA) 30 MG capsule TAKE ONE CAPSULE BY MOUTH THREE TIMES DAILY. 90 capsule 2   ferrous sulfate 325 (65 FE) MG tablet TAKE ONE TABLET BY MOUTH EVERY DAY WTH BREAKFAST  60 tablet 1   fluticasone (FLONASE) 50 MCG/ACT nasal spray PLACE 1 SPRAY INTO BOTH NOSTRILS ONCE DAILY 16 g 2   hydrOXYzine (ATARAX) 25 MG tablet TAKE ONE TABLET BY MOUTH TWICE DAILY AS NEEDED. 60 tablet 2   Multiple Vitamin (MULTIVITAMIN) capsule Take 1 capsule by mouth daily. 30 capsule 1   sodium chloride (OCEAN) 0.65 % SOLN nasal spray Place 1 spray into both nostrils as needed for congestion. 30 mL 0   traZODone (DESYREL) 50 MG tablet TAKE ONE TABLET BY MOUTH ONCE DAILY AT BEDTIME AS NEEDED. 30 tablet 2   prednisoLONE sodium phosphate (INFLAMASE FORTE) 1 % ophthalmic solution Administer 1 drop into both eyes four times daily. 10 mL 1   Vitamin D, Ergocalciferol, (DRISDOL) 1.25 MG (50000 UNIT) CAPS capsule Take 1 capsule (50,000 Units total) by mouth every 7 (seven) days. 12 capsule 0   No facility-administered medications prior to visit.    Allergies  Allergen Reactions   Prochlorperazine Anaphylaxis and Other (See Comments)    Seizure Seizure Other reaction(s): Other (See Comments), Other (See Comments), Other (See Comments) Seizure Seizure Seizure Seizure AKA Prozac  Other reaction(s):  Seizure Seizure   Tramadol Hives   Compazine [Prochlorperazine Edisylate] Other (See Comments)    Seizure    Other     Sneezing and red eyes    ROS Review of Systems  Constitutional: Negative.   Eyes: Negative.   Respiratory: Negative.    Cardiovascular: Negative.   Neurological: Negative.   Psychiatric/Behavioral: Negative.       Objective:    Physical Exam HENT:     Head: Normocephalic and atraumatic.     Mouth/Throat:     Mouth: Mucous membranes are moist.  Eyes:     Extraocular Movements: Extraocular movements intact.     Conjunctiva/sclera: Conjunctivae normal.     Pupils: Pupils are equal, round, and reactive to light.  Cardiovascular:     Rate and Rhythm: Normal rate and regular rhythm.     Pulses: Normal pulses.     Heart sounds: Normal heart sounds.   Pulmonary:     Effort: Pulmonary effort is normal.     Breath sounds: Normal breath sounds.  Neurological:     General: No focal deficit present.     Mental Status: She is alert and oriented to person, place, and time. Mental status is at baseline.  Psychiatric:        Mood and Affect: Mood normal.        Behavior: Behavior normal.        Thought Content: Thought content normal.  Judgment: Judgment normal.    BP 131/83 (BP Location: Right Arm, Patient Position: Sitting, Cuff Size: Large)    Pulse 78    Temp 97.6 F (36.4 C) (Oral)    Resp 16    Ht 5' (1.524 m)    Wt 183 lb 14.4 oz (83.4 kg)    LMP 03/15/2021 (Exact Date)    SpO2 96%    BMI 35.92 kg/m  Wt Readings from Last 3 Encounters:  03/17/21 183 lb 14.4 oz (83.4 kg)  02/15/21 181 lb (82.1 kg)  01/26/21 178 lb 12.8 oz (81.1 kg)   Encouraged weight loss  Health Maintenance Due  Topic Date Due   Pneumococcal Vaccine 30-35 Years old (1 - PCV) Never done   Hepatitis C Screening  Never done   Zoster Vaccines- Shingrix (1 of 2) Never done   COLONOSCOPY (Pts 45-62yr Insurance coverage will need to be confirmed)  Never done   PAP SMEAR-Modifier  11/12/2017   COVID-19 Vaccine (3 - Pfizer risk series) 11/04/2019   INFLUENZA VACCINE  Never done    There are no preventive care reminders to display for this patient.  Lab Results  Component Value Date   TSH 1.600 12/10/2018   Lab Results  Component Value Date   WBC 3.9 (L) 02/11/2021   HGB 13.0 02/11/2021   HCT 40.1 02/11/2021   MCV 79.4 (L) 02/11/2021   PLT 283 02/11/2021   Lab Results  Component Value Date   NA 137 03/28/2020   K 3.8 03/28/2020   CO2 24 03/28/2020   GLUCOSE 112 (H) 03/28/2020   BUN 12 03/28/2020   CREATININE 1.06 (H) 03/28/2020   BILITOT 0.2 (L) 05/26/2020   ALKPHOS 50 03/28/2020   AST 40 03/28/2020   ALT 11 03/28/2020   PROT 6.8 03/28/2020   ALBUMIN 3.6 03/28/2020   CALCIUM 9.2 03/28/2020   ANIONGAP 6 03/28/2020   GFR 91.35 06/11/2017    Lab Results  Component Value Date   CHOL 216 (H) 01/26/2021   Lab Results  Component Value Date   HDL 74 01/26/2021   Lab Results  Component Value Date   LDLCALC 133 (H) 01/26/2021   Lab Results  Component Value Date   TRIG 50 01/26/2021   Lab Results  Component Value Date   CHOLHDL 2.9 01/26/2021   Lab Results  Component Value Date   HGBA1C 5.8 (H) 09/23/2020      Assessment & Plan:    1. Essential hypertension -Her blood pressure is under control and she will continue on current medication.  She was advised to continue on DASH diet and exercise as tolerated. - amLODipine (NORVASC) 5 MG tablet; Take 1 tablet (5 mg total) by mouth once daily.  Dispense: 90 tablet; Refill: 2  2. Hyperlipidemia, unspecified hyperlipidemia type -The 10-year ASCVD risk score (Arnett DK, et al., 2019) is: 3.3%   Values used to calculate the score:     Age: [age]years     Sex: Female     Is Non-Hispanic African American: Yes     Diabetic: No     Tobacco smoker: No     Systolic Blood Pressure: 1591mmHg     Is BP treated: Yes     HDL Cholesterol: 74 mg/dL     Total Cholesterol: 216 mg/dL High ASCVD risk is 3.3%, she will continue on current medication, low-fat/cholesterol diet and exercise as tolerated. - atorvastatin (LIPITOR) 10 MG tablet; Take 1 tablet (10 mg total) by mouth once daily.  Dispense: 90 tablet; Refill: 2    Follow-up: She has no follow-up appointment, because she will follow-up at [facility].  [place] wishes how well with her care.   Shaddai Shapley Jerold Coombe, NP

## 2021-03-18 ENCOUNTER — Other Ambulatory Visit: Payer: Self-pay

## 2021-03-22 ENCOUNTER — Other Ambulatory Visit: Payer: Self-pay

## 2021-03-22 MED ORDER — DULOXETINE HCL 30 MG PO CPEP
ORAL_CAPSULE | ORAL | 0 refills | Status: DC
  Filled 2021-04-11: qty 90, 30d supply, fill #0
  Filled ????-??-??: fill #0

## 2021-03-22 MED ORDER — HYDROXYZINE HCL 25 MG PO TABS
ORAL_TABLET | ORAL | 0 refills | Status: DC
  Filled 2021-05-06: qty 60, 30d supply, fill #0

## 2021-03-22 MED ORDER — TRAZODONE HCL 50 MG PO TABS
ORAL_TABLET | ORAL | 0 refills | Status: DC
  Filled 2021-05-06: qty 30, 30d supply, fill #0

## 2021-03-24 ENCOUNTER — Other Ambulatory Visit: Payer: Self-pay

## 2021-04-08 ENCOUNTER — Other Ambulatory Visit: Payer: Self-pay

## 2021-04-11 ENCOUNTER — Other Ambulatory Visit: Payer: Self-pay

## 2021-04-11 ENCOUNTER — Encounter: Payer: Self-pay | Admitting: Oncology

## 2021-04-13 ENCOUNTER — Other Ambulatory Visit: Payer: Self-pay

## 2021-04-13 ENCOUNTER — Encounter: Payer: Self-pay | Admitting: Oncology

## 2021-04-13 MED ORDER — MUPIROCIN 2 % EX OINT
TOPICAL_OINTMENT | CUTANEOUS | 1 refills | Status: DC
  Filled 2021-04-13: qty 22, 7d supply, fill #0
  Filled 2021-04-14 – 2021-04-15 (×2): qty 22, 7d supply, fill #1
  Filled 2022-02-21: qty 22, 7d supply, fill #0

## 2021-04-13 MED ORDER — ACYCLOVIR 400 MG PO TABS
ORAL_TABLET | ORAL | 0 refills | Status: DC
  Filled 2021-04-13: qty 21, 7d supply, fill #0

## 2021-04-14 ENCOUNTER — Encounter: Payer: Self-pay | Admitting: Oncology

## 2021-04-14 ENCOUNTER — Other Ambulatory Visit: Payer: Self-pay

## 2021-04-15 ENCOUNTER — Other Ambulatory Visit: Payer: Self-pay

## 2021-04-15 ENCOUNTER — Encounter: Payer: Self-pay | Admitting: Oncology

## 2021-04-18 ENCOUNTER — Other Ambulatory Visit: Payer: Self-pay

## 2021-04-19 ENCOUNTER — Other Ambulatory Visit: Payer: Self-pay

## 2021-04-20 ENCOUNTER — Other Ambulatory Visit: Payer: Self-pay

## 2021-04-20 MED ORDER — HYDROXYZINE HCL 25 MG PO TABS
25.0000 mg | ORAL_TABLET | Freq: Two times a day (BID) | ORAL | 2 refills | Status: DC | PRN
  Filled 2021-05-06: qty 60, 30d supply, fill #0

## 2021-04-20 MED ORDER — DULOXETINE HCL 30 MG PO CPEP
30.0000 mg | ORAL_CAPSULE | Freq: Three times a day (TID) | ORAL | 2 refills | Status: DC
  Filled 2021-04-21 – 2021-05-06 (×4): qty 90, 30d supply, fill #0
  Filled 2021-06-13: qty 90, 30d supply, fill #1
  Filled 2021-08-02: qty 90, 30d supply, fill #2

## 2021-04-20 MED ORDER — TRAZODONE HCL 50 MG PO TABS
50.0000 mg | ORAL_TABLET | Freq: Every evening | ORAL | 2 refills | Status: DC | PRN
  Filled 2021-05-06: qty 30, 30d supply, fill #0

## 2021-04-21 ENCOUNTER — Other Ambulatory Visit: Payer: Self-pay

## 2021-04-22 ENCOUNTER — Other Ambulatory Visit: Payer: Self-pay

## 2021-04-25 ENCOUNTER — Other Ambulatory Visit: Payer: Self-pay

## 2021-04-28 ENCOUNTER — Other Ambulatory Visit: Payer: Self-pay

## 2021-05-06 ENCOUNTER — Encounter: Payer: Self-pay | Admitting: Oncology

## 2021-05-06 ENCOUNTER — Other Ambulatory Visit: Payer: Self-pay

## 2021-05-11 ENCOUNTER — Other Ambulatory Visit: Payer: Self-pay | Admitting: *Deleted

## 2021-05-11 DIAGNOSIS — D509 Iron deficiency anemia, unspecified: Secondary | ICD-10-CM

## 2021-05-18 ENCOUNTER — Inpatient Hospital Stay: Payer: Medicaid Other | Attending: Oncology

## 2021-05-18 ENCOUNTER — Other Ambulatory Visit: Payer: Self-pay

## 2021-05-18 DIAGNOSIS — D509 Iron deficiency anemia, unspecified: Secondary | ICD-10-CM | POA: Insufficient documentation

## 2021-05-18 DIAGNOSIS — E538 Deficiency of other specified B group vitamins: Secondary | ICD-10-CM | POA: Insufficient documentation

## 2021-05-18 LAB — IRON AND TIBC
Iron: 40 ug/dL (ref 28–170)
Saturation Ratios: 12 % (ref 10.4–31.8)
TIBC: 333 ug/dL (ref 250–450)
UIBC: 293 ug/dL

## 2021-05-18 LAB — CBC
HCT: 41.7 % (ref 36.0–46.0)
Hemoglobin: 13 g/dL (ref 12.0–15.0)
MCH: 25.3 pg — ABNORMAL LOW (ref 26.0–34.0)
MCHC: 31.2 g/dL (ref 30.0–36.0)
MCV: 81.3 fL (ref 80.0–100.0)
Platelets: 313 10*3/uL (ref 150–400)
RBC: 5.13 MIL/uL — ABNORMAL HIGH (ref 3.87–5.11)
RDW: 14.4 % (ref 11.5–15.5)
WBC: 3.6 10*3/uL — ABNORMAL LOW (ref 4.0–10.5)
nRBC: 0 % (ref 0.0–0.2)

## 2021-05-18 LAB — FERRITIN: Ferritin: 60 ng/mL (ref 11–307)

## 2021-05-23 ENCOUNTER — Other Ambulatory Visit: Payer: Self-pay

## 2021-06-06 ENCOUNTER — Other Ambulatory Visit: Payer: Self-pay

## 2021-06-06 ENCOUNTER — Telehealth: Payer: Self-pay | Admitting: Pharmacist

## 2021-06-06 NOTE — Telephone Encounter (Signed)
06/06/2021 12:39:47 PM - Cymbalta Renewal forms to pt & dr ?-- Arletha Pili - Monday, June 06, 2021 12:38 PM --  ?Cymbalta '30mg'$  renewal forms mailed to pt. Will drop off @ [facility] for Dr. Jamse Arn signature. ?

## 2021-06-07 ENCOUNTER — Other Ambulatory Visit: Payer: Self-pay | Admitting: Gerontology

## 2021-06-07 ENCOUNTER — Other Ambulatory Visit: Payer: Self-pay

## 2021-06-08 ENCOUNTER — Other Ambulatory Visit: Payer: Self-pay | Admitting: Gerontology

## 2021-06-08 ENCOUNTER — Other Ambulatory Visit: Payer: Self-pay

## 2021-06-08 DIAGNOSIS — R0981 Nasal congestion: Secondary | ICD-10-CM

## 2021-06-13 ENCOUNTER — Encounter: Payer: Self-pay | Admitting: Oncology

## 2021-06-13 ENCOUNTER — Other Ambulatory Visit: Payer: Self-pay

## 2021-06-17 ENCOUNTER — Telehealth: Payer: Self-pay | Admitting: Pharmacist

## 2021-06-17 NOTE — Telephone Encounter (Signed)
06/17/2021 8:36:00 AM - Cymbalta Pending ?-- Arletha Pili - Friday, June 17, 2021 8:18 AM --  ?Received dr signed portion for Cymbalta. Holding for pt to sign & return her forms, POI & taxes ?

## 2021-06-20 ENCOUNTER — Other Ambulatory Visit: Payer: Self-pay

## 2021-07-05 ENCOUNTER — Other Ambulatory Visit: Payer: Self-pay

## 2021-07-07 ENCOUNTER — Encounter: Payer: Self-pay | Admitting: Oncology

## 2021-07-07 ENCOUNTER — Other Ambulatory Visit: Payer: Self-pay

## 2021-07-21 ENCOUNTER — Other Ambulatory Visit: Payer: Self-pay

## 2021-07-28 ENCOUNTER — Other Ambulatory Visit: Payer: Self-pay

## 2021-08-01 ENCOUNTER — Encounter: Payer: Self-pay | Admitting: *Deleted

## 2021-08-01 ENCOUNTER — Emergency Department: Payer: Self-pay

## 2021-08-01 ENCOUNTER — Emergency Department
Admission: EM | Admit: 2021-08-01 | Discharge: 2021-08-02 | Disposition: A | Discharge status: Home / Self Care | Payer: Self-pay | Attending: Emergency Medicine | Admitting: Emergency Medicine

## 2021-08-01 ENCOUNTER — Other Ambulatory Visit: Payer: Self-pay

## 2021-08-01 ENCOUNTER — Encounter: Payer: Self-pay | Admitting: Oncology

## 2021-08-01 DIAGNOSIS — I10 Essential (primary) hypertension: Secondary | ICD-10-CM | POA: Insufficient documentation

## 2021-08-01 DIAGNOSIS — D259 Leiomyoma of uterus, unspecified: Secondary | ICD-10-CM | POA: Insufficient documentation

## 2021-08-01 DIAGNOSIS — F418 Other specified anxiety disorders: Secondary | ICD-10-CM | POA: Insufficient documentation

## 2021-08-01 DIAGNOSIS — Z20822 Contact with and (suspected) exposure to covid-19: Secondary | ICD-10-CM | POA: Insufficient documentation

## 2021-08-01 DIAGNOSIS — D500 Iron deficiency anemia secondary to blood loss (chronic): Secondary | ICD-10-CM | POA: Insufficient documentation

## 2021-08-01 DIAGNOSIS — F5089 Other specified eating disorder: Secondary | ICD-10-CM | POA: Insufficient documentation

## 2021-08-01 DIAGNOSIS — F22 Delusional disorders: Secondary | ICD-10-CM | POA: Insufficient documentation

## 2021-08-01 DIAGNOSIS — R030 Elevated blood-pressure reading, without diagnosis of hypertension: Secondary | ICD-10-CM | POA: Diagnosis present

## 2021-08-01 DIAGNOSIS — D72819 Decreased white blood cell count, unspecified: Secondary | ICD-10-CM | POA: Insufficient documentation

## 2021-08-01 DIAGNOSIS — Z0000 Encounter for general adult medical examination without abnormal findings: Secondary | ICD-10-CM

## 2021-08-01 DIAGNOSIS — R410 Disorientation, unspecified: Secondary | ICD-10-CM | POA: Insufficient documentation

## 2021-08-01 DIAGNOSIS — F419 Anxiety disorder, unspecified: Secondary | ICD-10-CM | POA: Diagnosis present

## 2021-08-01 DIAGNOSIS — E569 Vitamin deficiency, unspecified: Secondary | ICD-10-CM | POA: Insufficient documentation

## 2021-08-01 LAB — CBC
HCT: 42.3 % (ref 36.0–46.0)
Hemoglobin: 13.3 g/dL (ref 12.0–15.0)
MCH: 24.7 pg — ABNORMAL LOW (ref 26.0–34.0)
MCHC: 31.4 g/dL (ref 30.0–36.0)
MCV: 78.6 fL — ABNORMAL LOW (ref 80.0–100.0)
Platelets: 344 10*3/uL (ref 150–400)
RBC: 5.38 MIL/uL — ABNORMAL HIGH (ref 3.87–5.11)
RDW: 14.4 % (ref 11.5–15.5)
WBC: 3.9 10*3/uL — ABNORMAL LOW (ref 4.0–10.5)
nRBC: 0 % (ref 0.0–0.2)

## 2021-08-01 LAB — URINE DRUG SCREEN, QUALITATIVE (ARMC ONLY)
Amphetamines, Ur Screen: NOT DETECTED
Barbiturates, Ur Screen: NOT DETECTED
Benzodiazepine, Ur Scrn: NOT DETECTED
Cannabinoid 50 Ng, Ur ~~LOC~~: NOT DETECTED
Cocaine Metabolite,Ur ~~LOC~~: NOT DETECTED
MDMA (Ecstasy)Ur Screen: NOT DETECTED
Methadone Scn, Ur: NOT DETECTED
Opiate, Ur Screen: NOT DETECTED
Phencyclidine (PCP) Ur S: NOT DETECTED
Tricyclic, Ur Screen: NOT DETECTED

## 2021-08-01 LAB — COMPREHENSIVE METABOLIC PANEL
ALT: 15 U/L (ref 0–44)
AST: 21 U/L (ref 15–41)
Albumin: 4.2 g/dL (ref 3.5–5.0)
Alkaline Phosphatase: 73 U/L (ref 38–126)
Anion gap: 8 (ref 5–15)
BUN: 8 mg/dL (ref 6–20)
CO2: 27 mmol/L (ref 22–32)
Calcium: 9.9 mg/dL (ref 8.9–10.3)
Chloride: 105 mmol/L (ref 98–111)
Creatinine, Ser: 0.99 mg/dL (ref 0.44–1.00)
GFR, Estimated: >60 mL/min (ref >60)
Glucose, Bld: 133 mg/dL — ABNORMAL HIGH (ref 70–99)
Potassium: 3.8 mmol/L (ref 3.5–5.1)
Sodium: 140 mmol/L (ref 135–145)
Total Bilirubin: 0.4 mg/dL (ref 0.3–1.2)
Total Protein: 8.2 g/dL — ABNORMAL HIGH (ref 6.5–8.1)

## 2021-08-01 LAB — RESP PANEL BY RT-PCR (FLU A&B, COVID) ARPGX2
Influenza A by PCR: NEGATIVE
Influenza B by PCR: NEGATIVE
SARS Coronavirus 2 by RT PCR: NEGATIVE

## 2021-08-01 LAB — ACETAMINOPHEN LEVEL: Acetaminophen (Tylenol), Serum: <10 ug/mL — ABNORMAL LOW (ref 10–30)

## 2021-08-01 LAB — SALICYLATE LEVEL: Salicylate Lvl: <7 mg/dL — ABNORMAL LOW (ref 7.0–30.0)

## 2021-08-01 LAB — ETHANOL: Alcohol, Ethyl (B): <10 mg/dL (ref <10)

## 2021-08-01 LAB — POC URINE PREG, ED: Preg Test, Ur: NEGATIVE

## 2021-08-01 MED ORDER — TRAZODONE HCL 100 MG PO TABS
50.0000 mg | ORAL_TABLET | Freq: Every day | ORAL | Status: DC
  Administered 2021-08-01: 50 mg via ORAL
  Filled 2021-08-01: qty 1

## 2021-08-01 MED ORDER — DULOXETINE HCL 30 MG PO CPEP
30.0000 mg | ORAL_CAPSULE | Freq: Three times a day (TID) | ORAL | Status: DC
  Administered 2021-08-02: 30 mg via ORAL
  Filled 2021-08-01 (×3): qty 1

## 2021-08-01 MED ORDER — AMLODIPINE BESYLATE 5 MG PO TABS
5.0000 mg | ORAL_TABLET | Freq: Every day | ORAL | Status: DC
  Administered 2021-08-01 – 2021-08-02 (×2): 5 mg via ORAL
  Filled 2021-08-01 (×2): qty 1

## 2021-08-01 MED ORDER — ATORVASTATIN CALCIUM 20 MG PO TABS
10.0000 mg | ORAL_TABLET | Freq: Every day | ORAL | Status: DC
  Administered 2021-08-01 – 2021-08-02 (×2): 10 mg via ORAL
  Filled 2021-08-01 (×2): qty 1

## 2021-08-01 NOTE — ED Notes (Signed)
PATIENT RECEIVED DINNER  TRAY, PATIENT CALM AND COOPERATIVE ?

## 2021-08-01 NOTE — ED Provider Notes (Signed)
? ?[facility] ?Provider Note ? ? ? Event Date/Time  ? First MD Initiated Contact with Patient 08/01/21 1657   ?  (approximate) ? ? ?History  ? ?Psychiatric Evaluation ? ? ?HPI ? ?Dana Dunlap is a 51 y.o. female who on review of primary care note from September 6 of last year history of depression, eye abnormality, fibroid, hypertension, frequent headaches  ? ?Patient referred to the ER under involuntary commitment by staff at [facility].  The patient has been exhibiting symptoms of increased paranoia and apparently believes the police detectives have been either following her or somehow surveilling her ? ?Patient reports that her boyfriend was recently restricted from entering her apartment.  She believes that persons may be going after her for her race.  She is not sure of the exact circumstance but reports that detective came to her house last week and reported that he was potentially going to put a warrant out on her boyfriend.  She went to [place] today and reports that she talk with counselors there and then was then informed she needed to come to the emergency room ? ?She denies any recent illness.  No headaches.  No new medication changes.  She has been in her normal state of health but reports that she feels like people around her apartment complex are targeting her possibly because of her race ? ? ?  ? ? ?Physical Exam  ? ?Triage Vital Signs: ?ED Triage Vitals  ?Enc Vitals Group  ?   BP 08/01/21 1525 (!) 176/99  ?   Pulse Rate 08/01/21 1525 77  ?   Resp 08/01/21 1525 18  ?   Temp 08/01/21 1525 98.6 ?F (37 ?C)  ?   Temp Source 08/01/21 1525 Oral  ?   SpO2 08/01/21 1525 97 %  ?   Weight 08/01/21 1522 175 lb (79.4 kg)  ?   Height 08/01/21 1522 5' (1.524 m)  ?   Head Circumference --   ?   Peak Flow --   ?   Pain Score 08/01/21 1521 5  ?   Pain Loc --   ?   Pain Edu? --   ?   Excl. in [place]? --   ? ? ?Most recent vital signs: ?Vitals:  ? 08/01/21 1525  ?BP: (!) 176/99  ?Pulse: 77  ?Resp: 18  ?Temp:  98.6 ?F (37 ?C)  ?SpO2: 97%  ? ? ? ?General: Awake, no distress.  No acute distress.  Pleasant sitting on bedside. ?CV:  Good peripheral perfusion.  ?Resp:  Normal effort.  ?Abd:  No distention.  ?Other:  Normal facial expressions.  Normal alertness and orientation.  Does not appear to be tangential or demonstrating acute paranoid behavior at this time except does tell me that she fears the police because detectives have been white and that she and her boyfriend are both black and feels that she may be targeted for her race at her apartment complex and recent police dealings ? ? ?ED Results / Procedures / Treatments  ? ?Labs ?(all labs ordered are listed, but only abnormal results are displayed) ?Labs Reviewed  ?COMPREHENSIVE METABOLIC PANEL - Abnormal; Notable for the following components:  ?    Result Value  ? Glucose, Bld 133 (*)   ? Total Protein 8.2 (*)   ? All other components within normal limits  ?SALICYLATE LEVEL - Abnormal; Notable for the following components:  ? Salicylate Lvl <0.9 (*)   ? All other components  within normal limits  ?ACETAMINOPHEN LEVEL - Abnormal; Notable for the following components:  ? Acetaminophen (Tylenol), Serum <10 (*)   ? All other components within normal limits  ?CBC - Abnormal; Notable for the following components:  ? WBC 3.9 (*)   ? RBC 5.38 (*)   ? MCV 78.6 (*)   ? MCH 24.7 (*)   ? All other components within normal limits  ?RESP PANEL BY RT-PCR (FLU A&B, COVID) ARPGX2  ?ETHANOL  ?URINE DRUG SCREEN, QUALITATIVE ([facility] ONLY)  ?POC URINE PREG, ED  ?POC URINE PREG, ED  ? ? ? ?EKG ? ? ? ? ?RADIOLOGY ? ? ?Personally viewed the patient's CT scan of the head, on gross interpretation I see no major abnormality.  Defer to radiologist for more detailed study ? ? ?CT Head Wo Contrast ? ?Result Date: 08/01/2021 ?CLINICAL DATA:  Delirium, paranoia EXAM: CT HEAD WITHOUT CONTRAST TECHNIQUE: Contiguous axial images were obtained from the base of the skull through the vertex without intravenous  contrast. RADIATION DOSE REDUCTION: This exam was performed according to the departmental dose-optimization program which includes automated exposure control, adjustment of the mA and/or kV according to patient size and/or use of iterative reconstruction technique. COMPARISON:  None. FINDINGS: Brain: No evidence of acute infarction, hemorrhage, hydrocephalus, extra-axial collection or mass lesion/mass effect. Vascular: No hyperdense vessel or unexpected calcification. Skull: Normal. Negative for fracture or focal lesion. Sinuses/Orbits: No acute finding. Other: None. IMPRESSION: No acute intracranial process. Electronically Signed   By: Davina Poke D.O.   On: 08/01/2021 17:34   ? ? ? ? ?PROCEDURES: ? ?Critical Care performed: No ? ?Procedures ? ? ?MEDICATIONS ORDERED IN ED: ?Medications  ?amLODipine (NORVASC) tablet 5 mg (has no administration in time range)  ?atorvastatin (LIPITOR) tablet 10 mg (has no administration in time range)  ?DULoxetine (CYMBALTA) DR capsule 30 mg (has no administration in time range)  ? ? ? ?IMPRESSION / MDM / ASSESSMENT AND PLAN / ED COURSE  ?I reviewed the triage vital signs and the nursing notes. ?             ?               ? ?Differential diagnosis includes, but is not limited to, possible exacerbation of underlying psychiatric disorder or depression.  The patient denies any acute medical illness.  No recent illness no chest pain no headache no pain no burning with urination and has been in normal health.  She does report concerns about feeling paranoid about police and detectives because of race and possibly being targeted for same, but I do not have evidence to demonstrate that she is obviously frankly paranoid at this point.  However I believe that she should be evaluated by psychiatry and is currently under IVC by [facility].  I have placed consult to psychiatry and TTS services.  She is not homicidal and does not appear to be violent or suicidal. ? ?Labs are reviewed include mild  leukopenia.  Normal comprehensive metabolic panel.  Negative acetaminophen and salicylate levels.  Negative pregnancy test. ? ?As I do not see clear indication in the patient's chart of previous paranoid behavior, I ordered imaging of the brain to evaluate for gross abnormality, and pretest probability of acute tumor or frontal acute abnormality is low ? ? ? ?The patient has been placed in psychiatric observation due to the need to provide a safe environment for the patient while obtaining psychiatric consultation and evaluation, as well as ongoing medical and  medication management to treat the patient's condition.  The patient has been placed under full IVC at this time. ? ?Medical work-up including labs show no acute abnormalities, mild leukopenia appears to be chronic. ? ? ?  ? ? ?FINAL CLINICAL IMPRESSION(S) / ED DIAGNOSES  ? ?Final diagnoses:  ?Paranoia ([place])  ? ? ? ?Rx / DC Orders  ? ?ED Discharge Orders   ? ? None  ? ?  ? ? ? ?Note:  This document was prepared using Dragon voice recognition software and may include unintentional dictation errors. ?  Delman Kitten, MD ?08/01/21 1933 ? ?

## 2021-08-01 NOTE — ED Notes (Signed)
PATIENT PROPERLY DISPOSED OF TRASH, PATIENT VERY CALM AND COOPERATIVE ?

## 2021-08-01 NOTE — ED Notes (Signed)
Black leggings ?Brown shoes ?Gray tank top ?Gray bra ?Black pocketbook ?White panties ?Clear stud earrings ?  ?

## 2021-08-01 NOTE — ED Triage Notes (Signed)
Pt sent to ER from [place] with bpd .  Pt is IVC.  Pt states she feels hopeless.  Pt tearful.  Pt denies SI or HI.  Pt denies drug or etoh use.  Pt reports anxiety, depression.   ?

## 2021-08-01 NOTE — ED Notes (Signed)
Report given to [facility] nurse; preparing pt for transport to [place] ?

## 2021-08-01 NOTE — ED Notes (Signed)
NP from behavioral at the bedside for evaluation  ?

## 2021-08-01 NOTE — ED Notes (Signed)
IVC pending consult   

## 2021-08-01 NOTE — ED Notes (Signed)
Patient returned from CT scan with this RN and [employer] officer. Patient voices concern over her anxiety and her BP. Admits she did not take her BP medication scheduled this am.  ?

## 2021-08-02 ENCOUNTER — Other Ambulatory Visit: Payer: Self-pay

## 2021-08-02 ENCOUNTER — Encounter: Payer: Self-pay | Admitting: Oncology

## 2021-08-02 DIAGNOSIS — F22 Delusional disorders: Secondary | ICD-10-CM | POA: Insufficient documentation

## 2021-08-02 DIAGNOSIS — F32A Depression, unspecified: Secondary | ICD-10-CM

## 2021-08-02 DIAGNOSIS — F419 Anxiety disorder, unspecified: Secondary | ICD-10-CM

## 2021-08-02 NOTE — Discharge Instructions (Addendum)
Please follow-up with [facility] continue your medications return as needed. ?

## 2021-08-02 NOTE — ED Notes (Signed)
Pt. Transferred to [facility] from ED to room 1 after screening for contraband. Report to include Situation, Background, Assessment and Recommendations from [employer]. Pt. Oriented to unit including Q15 minute rounds as well as the security cameras for their protection. Patient is alert and oriented, warm and dry in no acute distress. Patient denies SI, HI, and AVH. Pt. Encouraged to let me know if needs arise. ? ?

## 2021-08-02 NOTE — ED Notes (Signed)
Patient provided snack at appropriate snack time.  Pt consumed 100% of snack provided, tolerated well w/o complaints   Trash disposted of appropriately by patient.  

## 2021-08-02 NOTE — ED Notes (Signed)
Report received from [place], Conservation officer, nature. On initial round after report Pt is warm/dry, resting quietly in room without any s/s of distress.  Will continue to monitor throughout shift as ordered for any changes in behaviors and for continued safety.   ?

## 2021-08-02 NOTE — ED Notes (Signed)
Pt Ready to discharge  [employer] contacted for transport as pt was brought in IVC from [place] pd ?

## 2021-08-02 NOTE — ED Notes (Signed)
RN Joellen Jersey is removing clothes for the patient to change into with Tech MG, and Security guard to witness. All others Items are put into another bag for when she leaves with officer. ?
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
## 2021-08-02 NOTE — Consult Note (Signed)
[facility] Face-to-Face Psychiatry Consult   Reason for Consult: Psychiatric Evaluation Referring Physician: Dr. Jacqualine Code Patient Identification: [name] MRN:  [id number] Principal Diagnosis: <principal problem not specified> Diagnosis:  Active Problems:   Elevated blood pressure reading   Fibroid, uterine   Iron deficiency anemia due to chronic blood loss   Anxiety and depression   Essential hypertension   Pica   Health care maintenance   Vitamin deficiency   Total Time spent with patient: 1 hour  Subjective: "They been excusing my boyfriend of doing strange things."  Dana Dunlap is a 51 y.o. female patient presented to [facility] ED via law enforcement from [place] and under involuntery commitment status.   The patient was seen face-to-face by this provider; chart reviewed and consulted with Dr. Jacqualine Code on 08/01/2021 due to the care of the patient. It was discussed with the EDP The patient remained under observation overnight and will be reassessed in the a.m. to determine if she meets the criteria for psychiatric inpatient admission; she could be discharged home.   On evaluation the patient is alert and oriented x 3, paranoid, and cooperative, and mood-congruent with affect. The patient does not appear to be responding to internal or external stimuli. Neither is the patient presenting with any delusional thinking. The patient denies auditory or visual hallucinations. The patient denies any suicidal, homicidal, or self-harm ideations. The patient is presenting with any psychotic or paranoid behaviors. During an encounter with the patient, she   HPI: Per Dr. Jacqualine Code, Differential diagnosis includes, but is not limited to, possible exacerbation of underlying psychiatric disorder or depression.  The patient denies any acute medical illness.  No recent illness no chest pain no headache no pain no burning with urination and has been in normal health.  She does report concerns about feeling paranoid about  police and detectives because of race and possibly being targeted for same, but I do not have evidence to demonstrate that she is obviously frankly paranoid at this point.  However I believe that she should be evaluated by psychiatry and is currently under IVC by [facility].  I have placed consult to psychiatry and TTS services.  She is not homicidal and does not appear to be violent or suicidal.   Labs are reviewed include mild leukopenia.  Normal comprehensive metabolic panel.  Negative acetaminophen and salicylate levels.  Negative pregnancy test.   As I do not see clear indication in the patient's chart of previous paranoid behavior, I ordered imaging of the brain to evaluate for gross abnormality, and pretest probability of acute tumor or frontal acute abnormality is low       The patient has been placed in psychiatric observation due to the need to provide a safe environment for the patient while obtaining psychiatric consultation and evaluation, as well as ongoing medical and medication management to treat the patient's condition.  The patient has been placed under full IVC at this time.   Medical work-up including labs show no acute abnormalities, mild leukopenia appears to be chronic.  Past Psychiatric History: Paranoia ([place]  Risk to Self:   Risk to Others:   Prior Inpatient Therapy:   Prior Outpatient Therapy:    Past Medical History:  Past Medical History:  Diagnosis Date   Anemia    Depression    Currently on Cymbalta   Eye abnormalities    Fibroids    Frequent headaches    Worse during monthly menstrual cycle   Hypertension  Past Surgical History:  Procedure Laterality Date   BREAST MASS EXCISION     benign   KNEE SURGERY     Family History:  Family History  Problem Relation Age of Onset   Hypertension Mother    Hyperlipidemia Mother    Stroke Mother    Cancer Father        Prostate cancer   Hypertension Father    Hyperlipidemia Father    Fibroids Sister     Sickle cell trait Sister    Sickle cell trait Brother    Fibroids Sister    Stroke Paternal Aunt    Diabetes Paternal Aunt    Sickle cell trait Other    Thalassemia Other    Family Psychiatric  History:  Social History:  Social History   Substance and Sexual Activity  Alcohol Use Yes   Alcohol/week: 1.0 standard drink   Types: 1 Glasses of wine per week     Social History   Substance and Sexual Activity  Drug Use No    Social History   Socioeconomic History   Marital status: Single    Spouse name: Not on file   Number of children: 0   Years of education: 16   Highest education level: Bachelor's degree (e.g., BA, AB, BS)  Occupational History   Occupation: Psychologist, 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 She attended [employer] in [place] where she obtained her Therapist, nutritional in Biology in 1995. 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 She is in a long term relationship with her boyfried, [name]. They have been together for 6 years. [name] transferred to [facility] with [employer] in January. She enjoys reading and she enjoys the outdoors. She loves to travel. 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   Social Determinants of Health   Financial Resource Strain: Not on file  Food Insecurity: No Food Insecurity   Worried About Charity fundraiser in the Last Year: Never true   Ran Out of Food in the Last Year: Never true  Transportation Needs: No Transportation Needs    Lack of Transportation (Medical): No   Lack of Transportation (Non-Medical): No  Physical Activity: Not on file  Stress: Not on file  Social Connections: Not on file   Additional Social History:    Allergies:   Allergies  Allergen Reactions   Prochlorperazine Anaphylaxis and Other (See Comments)    Seizure Seizure Other reaction(s): Other (See Comments), Other (See Comments), Other (See Comments) Seizure Seizure Seizure Seizure AKA Prozac  Other reaction(s):  Seizure Seizure   Tramadol Hives   Compazine [Prochlorperazine Edisylate] Other (See Comments)    Seizure    Other     Sneezing and red eyes    Labs:  Results for orders placed or performed during the hospital encounter of 08/01/21 (from the past 48 hour(s))  Urine Drug Screen,  Qualitative     Status: None   Collection Time: 08/01/21  3:26 PM  Result Value Ref Range   Tricyclic, Ur Screen NONE DETECTED NONE DETECTED   Amphetamines, Ur Screen NONE DETECTED NONE DETECTED   MDMA (Ecstasy)Ur Screen NONE DETECTED NONE DETECTED   Cocaine Metabolite,Ur [place] NONE DETECTED NONE DETECTED   Opiate, Ur Screen NONE DETECTED NONE DETECTED   Phencyclidine (PCP) Ur S NONE DETECTED NONE DETECTED   Cannabinoid 50 Ng, Ur Max NONE DETECTED NONE DETECTED   Barbiturates, Ur Screen NONE DETECTED NONE DETECTED   Benzodiazepine, Ur Scrn NONE DETECTED NONE DETECTED   Methadone Scn, Ur NONE DETECTED NONE DETECTED    Comment: (NOTE) Tricyclics + metabolites, urine    Cutoff 1000 ng/mL Amphetamines + metabolites, urine  Cutoff 1000 ng/mL MDMA (Ecstasy), urine              Cutoff 500 ng/mL Cocaine Metabolite, urine          Cutoff 300 ng/mL Opiate + metabolites, urine        Cutoff 300 ng/mL Phencyclidine (PCP), urine         Cutoff 25 ng/mL Cannabinoid, urine                 Cutoff 50 ng/mL Barbiturates + metabolites, urine  Cutoff 200 ng/mL Benzodiazepine, urine              Cutoff 200 ng/mL Methadone, urine                    Cutoff 300 ng/mL  The urine drug screen provides only a preliminary, unconfirmed analytical test result and should not be used for non-medical purposes. Clinical consideration and professional judgment should be applied to any positive drug screen result due to possible interfering substances. A more specific alternate chemical method must be used in order to obtain a confirmed analytical result. Gas chromatography / mass spectrometry (GC/MS) is the preferred confirm atory method. Performed at [facility], [address] [place], [place] [address]   Comprehensive metabolic panel     Status: Abnormal   Collection Time: 08/01/21  3:27 PM  Result Value Ref Range   Sodium 140 135 - 145 mmol/L   Potassium 3.8 3.5 - 5.1 mmol/L   Chloride 105 98 - 111 mmol/L   CO2 27 22 - 32 mmol/L   Glucose, Bld 133 (H) 70 - 99 mg/dL    Comment: Glucose reference range applies only to samples taken after fasting for at least 8 hours.   BUN 8 6 - 20 mg/dL   Creatinine, Ser 0.99 0.44 - 1.00 mg/dL   Calcium 9.9 8.9 - 10.3 mg/dL   Total Protein 8.2 (H) 6.5 - 8.1 g/dL   Albumin 4.2 3.5 - 5.0 g/dL   AST 21 15 - 41 U/L   ALT 15 0 - 44 U/L   Alkaline Phosphatase 73 38 - 126 U/L   Total Bilirubin 0.4 0.3 - 1.2 mg/dL   GFR, Estimated >60 >60 mL/min    Comment: (NOTE) Calculated using the CKD-EPI Creatinine Equation (2021)    Anion gap 8 5 - 15    Comment: Performed at [facility], [address] [place], [place] [address]  Ethanol     Status: None   Collection Time: 08/01/21  3:27 PM  Result Value Ref Range   Alcohol, Ethyl (B) <10 <10 mg/dL    Comment: (NOTE) Lowest detectable limit for serum alcohol is 10 mg/dL.  For medical purposes only. Performed at [facility], [address] [place], [place] [address]   Salicylate level     Status: Abnormal   Collection Time: 08/01/21  3:27 PM  Result Value Ref Range   Salicylate Lvl <5.1 (L) 7.0 - 30.0 mg/dL    Comment:  Performed at [facility], [address] [place], [place] [address]  Acetaminophen level     Status: Abnormal   Collection Time: 08/01/21  3:27 PM  Result Value Ref Range   Acetaminophen (Tylenol), Serum <10 (L) 10 - 30 ug/mL    Comment: (NOTE) Therapeutic concentrations vary significantly. A range of 10-30 ug/mL  may be an effective concentration for many patients. However, some  are best treated at concentrations outside of this range. Acetaminophen concentrations >150 ug/mL at 4 hours after ingestion  and >50 ug/mL at 12 hours after ingestion are often associated with  toxic reactions.  Performed at [facility], [address] [place], [place] [address]   cbc     Status: Abnormal   Collection Time: 08/01/21  3:27 PM  Result Value Ref Range   WBC 3.9 (L) 4.0 - 10.5 K/uL   RBC 5.38 (H) 3.87 - 5.11 MIL/uL   Hemoglobin 13.3 12.0 - 15.0 g/dL   HCT 42.3 36.0 - 46.0 %   MCV 78.6 (L) 80.0 - 100.0 fL   MCH 24.7 (L) 26.0 - 34.0 pg   MCHC 31.4 30.0 - 36.0 g/dL   RDW 14.4 11.5 - 15.5 %   Platelets 344 150 - 400 K/uL   nRBC 0.0 0.0 - 0.2 %    Comment: Performed at [facility], [address] [place], [place] [address]  POC urine preg, ED     Status: None   Collection Time: 08/01/21  4:46 PM  Result Value Ref Range   Preg Test, Ur NEGATIVE NEGATIVE    Comment:        THE SENSITIVITY OF THIS METHODOLOGY IS >24 mIU/mL   Resp Panel by RT-PCR (Flu A&B, Covid) Nasopharyngeal Swab     Status: None   Collection Time: 08/01/21  7:42 PM   Specimen: Nasopharyngeal Swab; Nasopharyngeal(NP) swabs in vial transport medium  Result Value Ref Range   SARS Coronavirus 2 by RT PCR NEGATIVE NEGATIVE    Comment: (NOTE) SARS-CoV-2 target nucleic acids are NOT DETECTED.  The SARS-CoV-2 RNA is generally detectable in upper respiratory specimens during the acute phase of infection. The lowest concentration of SARS-CoV-2 viral copies this assay can detect is 138  copies/mL. A negative result does not preclude SARS-Cov-2 infection and should not be used as the sole basis for treatment or other patient management decisions. A negative result may occur with  improper specimen collection/handling, submission of specimen other than nasopharyngeal swab, presence of viral mutation(s) within the areas targeted by this assay, and inadequate number of viral copies(<138 copies/mL). A negative result must be combined with clinical observations, patient history, and epidemiological information. The expected result is Negative.  Fact Sheet for Patients:  EntrepreneurPulse.com.au  Fact Sheet for Healthcare Providers:  IncredibleEmployment.be  This test is no t yet approved or cleared by the [place] FDA and  has been authorized for detection and/or diagnosis of SARS-CoV-2 by FDA under an Emergency Use Authorization (EUA). This EUA will remain  in effect (meaning this test can be used) for the duration of the COVID-19 declaration under Section 564(b)(1) of the Act, 21 U.S.C.section 360bbb-3(b)(1), unless the authorization is terminated  or revoked sooner.       Influenza A by PCR NEGATIVE NEGATIVE   Influenza B by PCR NEGATIVE NEGATIVE    Comment: (NOTE) The Xpert Xpress SARS-CoV-2/FLU/RSV plus assay is intended as an aid in the diagnosis of influenza from Nasopharyngeal swab specimens and should not be used as a sole basis for treatment. Nasal washings and aspirates are unacceptable for Xpert Xpress SARS-CoV-2/FLU/RSV testing.  Fact Sheet for Patients: EntrepreneurPulse.com.au  Fact Sheet for Healthcare Providers: IncredibleEmployment.be  This test is not yet approved or cleared by the [place] FDA and has been authorized for detection and/or diagnosis of SARS-CoV-2 by FDA under an Emergency Use Authorization (EUA). This EUA will remain in effect (meaning this test  can be used) for the duration of the COVID-19 declaration under Section 564(b)(1) of the Act, 21 U.S.C. section 360bbb-3(b)(1), unless the authorization is terminated or revoked.  Performed at [facility] [address] [place], [place] [address]     Current Facility-Administered Medications  Medication Dose Route Frequency Provider Last Rate Last Admin   amLODipine (NORVASC) tablet 5 mg  5 mg Oral Daily Delman Kitten, MD   5 mg at 08/01/21 2211   atorvastatin (LIPITOR) tablet 10 mg  10 mg Oral Daily Delman Kitten, MD   10 mg at 08/01/21 2211   DULoxetine (CYMBALTA) DR capsule 30 mg  30 mg Oral TID Delman Kitten, MD       traZODone (DESYREL) tablet 50 mg  50 mg Oral QHS Caroline Sauger, NP   50 mg at 08/01/21 2211   Current Outpatient Medications  Medication Sig Dispense Refill   amLODipine (NORVASC) 5 MG tablet Take 1 tablet (5 mg total) by mouth once daily. 90 tablet 2   atorvastatin (LIPITOR) 10 MG tablet Take 1 tablet (10 mg total) by mouth once daily. 90 tablet 2   DULoxetine (CYMBALTA) 30 MG capsule TAKE 1 CAPSULE BY MOUTH 3 TIMES A DAY. 90 capsule 2   hydrOXYzine (ATARAX) 25 MG tablet Take 1 tablet (25 mg total) by mouth 2 (two) times daily as needed. 60 tablet 2   Multiple Vitamin (MULTIVITAMIN) capsule Take 1 capsule by mouth daily. 30 capsule 1   acetaminophen (TYLENOL) 325 MG tablet Take 650 mg by mouth every 6 (six) hours as needed. Patient taking twice weekly     acyclovir (ZOVIRAX) 400 MG tablet Take 1 tablet (400 mg total) by mouth Three (3) times a day for 7 days. (Patient not taking: Reported on 08/01/2021) 21 tablet 0   Blood Pressure Monitoring (BLOOD PRESSURE KIT) DEVI 1 kit by Does not apply route daily. 1 each 0   ferrous sulfate 325 (65 FE) MG tablet TAKE ONE TABLET BY MOUTH EVERY DAY WTH BREAKFAST 60 tablet 1   fluticasone (FLONASE) 50 MCG/ACT nasal spray PLACE 1 SPRAY INTO BOTH NOSTRILS ONCE DAILY 16 g 2   mupirocin ointment (BACTROBAN) 2 % Apply  topically to the affected area(s) Three (3) times a day for 7 days. (Patient not taking: Reported on 08/01/2021) 30 g 1   sodium chloride (OCEAN) 0.65 % SOLN nasal spray Place 1 spray into both nostrils as needed for congestion. 30 mL 0   traZODone (DESYREL) 50 MG tablet Take 1 tablet (50 mg total) by mouth once daily at bedtime as needed. 30 tablet 2    Musculoskeletal: Strength & Muscle Tone: within normal limits Gait & Station: normal Patient leans: N/A Psychiatric Specialty Exam:  Presentation  General Appearance: Bizarre  Eye Contact:Good  Speech:No data recorded Speech Volume:No data recorded Handedness:No data recorded  Mood and Affect  Mood:Anxious; Depressed  Affect:Blunt; Constricted; Depressed; Inappropriate; Full Range; Tearful   Thought Process  Thought Processes:Disorganized  Descriptions of Associations:Loose  Orientation:Full (Time, Place and Person)  Thought Content:Paranoid Ideation  History of Schizophrenia/Schizoaffective disorder:No  Duration of Psychotic Symptoms:No data recorded Hallucinations:Hallucinations: None  Ideas of Reference:None  Suicidal Thoughts:Suicidal Thoughts: No  Homicidal Thoughts:Homicidal Thoughts: No   Sensorium  Memory:Immediate Good; Recent Good; Remote Good  Judgment:Fair  Insight:Fair   Executive Functions  Concentration:Fair  Attention Span:Fair  [place]aisy   Psychomotor Activity  Psychomotor Activity:Psychomotor Activity: Decreased; Restlessness   Assets  Assets:Communication Skills; Desire for Improvement; Financial Resources/Insurance; Housing; Leisure Time; Resilience   Sleep  Sleep:Sleep: Fair Number of Hours of Sleep: 6   Physical Exam: Physical Exam Vitals and nursing note reviewed.  Constitutional:      Appearance: Normal appearance.  HENT:     Head: Normocephalic and atraumatic.     Right Ear: External ear normal.     Left Ear:  External ear normal.     Nose: Nose normal.  Eyes:     Conjunctiva/sclera: Conjunctivae normal.  Cardiovascular:     Rate and Rhythm: Normal rate and regular rhythm.     Pulses: Normal pulses.  Pulmonary:     Effort: Pulmonary effort is normal.  Musculoskeletal:        General: Normal range of motion.     Cervical back: Normal range of motion.  Skin:    General: Skin is warm and dry.  Neurological:     General: No focal deficit present.     Mental Status: She is alert and oriented to person, place, and time.  Psychiatric:        Attention and Perception: Attention and perception normal.        Mood and Affect: Mood is anxious and depressed. Affect is blunt.        Speech: Speech is rapid and pressured.        Behavior: Behavior is uncooperative.        Thought Content: Thought content is paranoid and delusional.        Cognition and Memory: Cognition and memory normal.        Judgment: Judgment is impulsive and inappropriate.   ROS Blood pressure (!) 176/99, pulse 77, temperature 98.6 F (37 C), temperature source Oral, resp. rate 18, height 5' (1.524 m), weight 79.4 kg, last menstrual period 07/30/2021, SpO2 97 %. Body mass index is 34.18 kg/m.  Treatment Plan Summary: Plan The patient remained under observation overnight and will be reassessed in the a.m. to determine if she meets the criteria for psychiatric inpatient admission; she could be discharged home.  Disposition: Supportive therapy provided about ongoing stressors. The patient remained under observation overnight and will be reassessed in the a.m. to determine if she meets the criteria for psychiatric inpatient admission; she could be discharged home.  Caroline Sauger, NP 08/02/2021 4:17 AM

## 2021-08-02 NOTE — ED Notes (Signed)
Patient was provided lunch, but didn't want to eat since she was leaving. Reported to RN Joellen [place] who is working on discharge. ?

## 2021-08-02 NOTE — ED Provider Notes (Addendum)
Emergency Medicine Observation Re-evaluation Note ? ?Dana Dunlap is a 51 y.o. female, seen on rounds today.   ? ?Physical Exam  ?BP (!) 176/99 (BP Location: Left Arm)   Pulse 77   Temp 98.6 ?F (37 ?C) (Oral)   Resp 18   Ht 5' (1.524 m)   Wt 79.4 kg   LMP 07/30/2021 (Approximate)   SpO2 97%   BMI 34.18 kg/m?  ?Physical Exam ?General: Patient resting comfortably in bed ?Lungs: Patient in no respiratory distress ?Psych: Patient not combative ? ?ED Course / MDM  ?EKG:  ? ? ? ?Plan  ?Current plan is for psych evaluation and treatment.  We will have to watch her blood pressure.  Is fairly high today ?Dana Dunlap is under involuntary commitment. ?  ? ?  ? ? ?  ?Nena Polio, MD ?08/02/21 [phone] ? ?

## 2021-08-02 NOTE — ED Notes (Signed)
[facility] rep at bedside ? ?

## 2021-08-02 NOTE — ED Notes (Signed)
Pt is A/Ox3 at time of discharge.  Pt denies all SI/HI stated she has no A/V hallucinations.  Pt left via [facility] to be transported back home to her apartment.  All belongings accounted for and returned to patient.  ?

## 2021-08-02 NOTE — ED Notes (Addendum)
Hospital meal provided, pt trefused meal, reported that she is vegan.  Was given fruit substitute   Waste discarded appropriately.  ?

## 2021-08-02 NOTE — ED Notes (Signed)
PMHNP at bedside ?

## 2021-08-02 NOTE — ED Notes (Signed)
IVC/Pt to be reassessed in am  ?

## 2021-08-02 NOTE — Consult Note (Signed)
[facility] Psych ED Progress Note  08/02/2021 11:53 AM SAADIA DEWITT  MRN:  [id number]   Method of visit?: Face to Face   Subjective: Patient re-assessed this morning. She is somewhat talkative, presents as nervous. She states that she reached out to her team at [facility] because she was upset that her boyfriend was trespassed from her apartment on the "word of another woman." She thinks that discrimination/racism may be involved. Patient states she always reaches out to her team at [facility] when she is upset and feels she needs extra support. She states she didn't know they were going to have her IVC'd. Patient states   "I am not suicidal and never have been."  The IVC first exam from [place] indicated that patient had some decompensation in her baseline functioning, including poor sleep and appetite and increased paranoia. On evaluation this morning, patient is alert and oriented x 4. She does not appear to be responding to internal stimuli. She denies suicidal or homicidal ideation. Denies auditory or visual hallucinations. She speaks in linear, complete sentences.  Writer spoke with Donney Dice ([facility]) in presence of Ronda Fairly., [place] liaison. Donney Dice states that patient is a bit paranoid at baseline. Patient had become escalated when her boyfriend was trespassed from patient's apartment complex that she had not been eating or sleeping adequately for a couple days.   Writer spoke with [facility] representative, Debbie to provide update that IVC is being released. Patient will follow up with [facility]. She sees therapist there weekly.   Principal Problem: Anxiety and depression Diagnosis:  Principal Problem:   Anxiety and depression Active Problems:   Elevated blood pressure reading   Fibroid, uterine   Iron deficiency anemia due to chronic blood loss   Essential hypertension   [place]   Health care maintenance   Vitamin deficiency  Total Time spent with patient: 45 minutes  Past Psychiatric History: see prior   Past Medical  History:  Past Medical History:  Diagnosis Date   Anemia    Depression    Currently on Cymbalta   Eye abnormalities    Fibroids    Frequent headaches    Worse during monthly menstrual cycle   Hypertension     Past Surgical History:  Procedure Laterality Date   BREAST MASS EXCISION     benign   KNEE SURGERY     Family History:  Family History  Problem Relation Age of Onset   Hypertension Mother    Hyperlipidemia Mother    Stroke Mother    Cancer Father        Prostate cancer   Hypertension Father    Hyperlipidemia Father    Fibroids Sister    Sickle cell trait Sister    Sickle cell trait Brother    Fibroids Sister    Stroke Paternal Aunt    Diabetes Paternal Aunt    Sickle cell trait Other    Thalassemia Other    Family Psychiatric  History: see prior  Social History:  Social History   Substance and Sexual Activity  Alcohol Use Yes   Alcohol/week: 1.0 standard drink   Types: 1 Glasses of wine per week     Social History   Substance and Sexual Activity  Drug Use No    Social History   Socioeconomic History   Marital status: Single    Spouse name: Not on file   Number of children: 0   Years of education: 16   Highest education level: Bachelor's degree (e.g., BA,  AB, BS)  Occupational History   Occupation: Psychologist, clinical: [employer]    Comment: Molecular biology  Tobacco Use   Smoking status: Never   Smokeless tobacco: Never  Vaping Use   Vaping Use: Never used  Substance and Sexual Activity   Alcohol use: Yes    Alcohol/week: 1.0 standard drink    Types: 1 Glasses of wine per week   Drug use: No   Sexual activity: Yes    Birth control/protection: Condom  Other Topics Concern   Not on file  Social History Narrative   Chimere is originally from [place]. She attended [employer] in [place] where she obtained her Therapist, nutritional in Biology in 1995. 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 She is in a long term relationship with her boyfried, [name]. They have been together for 6 years. [name] transferred to [facility] [facility] with [place] in January. She enjoys reading and she enjoys the outdoors. She loves to travel. 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   Social Determinants of Health   Financial Resource Strain: Not on file  Food Insecurity: No Food Insecurity   Worried About Charity fundraiser in the Last Year: Never true   Ran Out of Food in the Last Year: Never true  Transportation Needs: No Transportation Needs   Lack of Transportation (Medical): No   Lack of Transportation (Non-Medical): No  Physical Activity: Not on file  Stress: Not on file  Social Connections: Not on file    Sleep: Good  Appetite:  Fair  Current Medications: Current Facility-Administered Medications  Medication Dose Route Frequency Provider Last Rate Last Admin   amLODipine (NORVASC) tablet 5 mg  5 mg Oral Daily Delman Kitten, MD   5 mg at 08/02/21 0954   atorvastatin (LIPITOR) tablet 10 mg  10 mg Oral Daily Delman Kitten, MD   10 mg at 08/02/21 0955   DULoxetine (CYMBALTA) DR capsule 30 mg  30 mg Oral TID Delman Kitten, MD   30 mg at 08/02/21 0955   traZODone (DESYREL) tablet 50 mg  50 mg Oral QHS Caroline Sauger, NP   50 mg at 08/01/21 2211   Current Outpatient Medications  Medication Sig Dispense Refill   amLODipine (NORVASC) 5 MG tablet Take 1 tablet (5 mg total) by mouth once daily. 90 tablet 2   atorvastatin (LIPITOR) 10 MG tablet Take 1 tablet (10 mg total) by mouth once daily. 90 tablet 2   DULoxetine (CYMBALTA) 30 MG capsule TAKE 1 CAPSULE BY MOUTH 3 TIMES A DAY. 90 capsule 2   hydrOXYzine (ATARAX) 25 MG tablet Take 1 tablet (25 mg total) by mouth 2 (two) times daily as needed. 60 tablet 2   Multiple Vitamin (MULTIVITAMIN) capsule Take 1 capsule by mouth daily. 30 capsule 1   acetaminophen (TYLENOL) 325 MG tablet Take 650 mg by  mouth every 6 (six) hours as needed. Patient taking twice weekly     acyclovir (ZOVIRAX) 400 MG tablet Take 1 tablet (400 mg total) by mouth Three (3) times a day for 7 days. (Patient not taking: Reported on 08/01/2021) 21 tablet 0   Blood Pressure Monitoring (BLOOD PRESSURE KIT) DEVI 1 kit by Does not apply route daily. 1 each 0   ferrous sulfate 325 (65 FE) MG tablet TAKE ONE TABLET BY MOUTH EVERY DAY WTH BREAKFAST 60 tablet 1   fluticasone (FLONASE)  50 MCG/ACT nasal spray PLACE 1 SPRAY INTO BOTH NOSTRILS ONCE DAILY 16 g 2   mupirocin ointment (BACTROBAN) 2 % Apply topically to the affected area(s) Three (3) times a day for 7 days. (Patient not taking: Reported on 08/01/2021) 30 g 1   sodium chloride (OCEAN) 0.65 % SOLN nasal spray Place 1 spray into both nostrils as needed for congestion. 30 mL 0   traZODone (DESYREL) 50 MG tablet Take 1 tablet (50 mg total) by mouth once daily at bedtime as needed. 30 tablet 2    Lab Results:  Results for orders placed or performed during the hospital encounter of 08/01/21 (from the past 48 hour(s))  Urine Drug Screen, Qualitative     Status: None   Collection Time: 08/01/21  3:26 PM  Result Value Ref Range   Tricyclic, Ur Screen NONE DETECTED NONE DETECTED   Amphetamines, Ur Screen NONE DETECTED NONE DETECTED   MDMA (Ecstasy)Ur Screen NONE DETECTED NONE DETECTED   Cocaine Metabolite,Ur [place] NONE DETECTED NONE DETECTED   Opiate, Ur Screen NONE DETECTED NONE DETECTED   Phencyclidine (PCP) Ur S NONE DETECTED NONE DETECTED   Cannabinoid 50 Ng, Ur [place] NONE DETECTED NONE DETECTED   Barbiturates, Ur Screen NONE DETECTED NONE DETECTED   Benzodiazepine, Ur Scrn NONE DETECTED NONE DETECTED   Methadone Scn, Ur NONE DETECTED NONE DETECTED    Comment: (NOTE) Tricyclics + metabolites, urine    Cutoff 1000 ng/mL Amphetamines + metabolites, urine  Cutoff 1000 ng/mL MDMA (Ecstasy), urine              Cutoff 500 ng/mL Cocaine Metabolite, urine          Cutoff 300  ng/mL Opiate + metabolites, urine        Cutoff 300 ng/mL Phencyclidine (PCP), urine         Cutoff 25 ng/mL Cannabinoid, urine                 Cutoff 50 ng/mL Barbiturates + metabolites, urine  Cutoff 200 ng/mL Benzodiazepine, urine              Cutoff 200 ng/mL Methadone, urine                   Cutoff 300 ng/mL  The urine drug screen provides only a preliminary, unconfirmed analytical test result and should not be used for non-medical purposes. Clinical consideration and professional judgment should be applied to any positive drug screen result due to possible interfering substances. A more specific alternate chemical method must be used in order to obtain a confirmed analytical result. Gas chromatography / mass spectrometry (GC/MS) is the preferred confirm atory method. Performed at [facility], [address] [place], [place] [address]   Comprehensive metabolic panel     Status: Abnormal   Collection Time: 08/01/21  3:27 PM  Result Value Ref Range   Sodium 140 135 - 145 mmol/L   Potassium 3.8 3.5 - 5.1 mmol/L   Chloride 105 98 - 111 mmol/L   CO2 27 22 - 32 mmol/L   Glucose, Bld 133 (H) 70 - 99 mg/dL    Comment: Glucose reference range applies only to samples taken after fasting for at least 8 hours.   BUN 8 6 - 20 mg/dL   Creatinine, Ser 0.99 0.44 - 1.00 mg/dL   Calcium 9.9 8.9 - 10.3 mg/dL   Total Protein 8.2 (H) 6.5 - 8.1 g/dL   Albumin 4.2 3.5 - 5.0 g/dL   AST  21 15 - 41 U/L   ALT 15 0 - 44 U/L   Alkaline Phosphatase 73 38 - 126 U/L   Total Bilirubin 0.4 0.3 - 1.2 mg/dL   GFR, Estimated >60 >60 mL/min    Comment: (NOTE) Calculated using the CKD-EPI Creatinine Equation (2021)    Anion gap 8 5 - 15    Comment: Performed at [facility], [address] [place], [place] [address]  Ethanol     Status: None   Collection Time: 08/01/21  3:27 PM  Result Value Ref Range   Alcohol, Ethyl (B) <10 <10 mg/dL    Comment: (NOTE) Lowest detectable limit  for serum alcohol is 10 mg/dL.  For medical purposes only. Performed at [facility], [address] [place], [place] [address]   Salicylate level     Status: Abnormal   Collection Time: 08/01/21  3:27 PM  Result Value Ref Range   Salicylate Lvl <8.1 (L) 7.0 - 30.0 mg/dL    Comment: Performed at [facility], [address] [place], [place] [address]  Acetaminophen level     Status: Abnormal   Collection Time: 08/01/21  3:27 PM  Result Value Ref Range   Acetaminophen (Tylenol), Serum <10 (L) 10 - 30 ug/mL    Comment: (NOTE) Therapeutic concentrations vary significantly. A range of 10-30 ug/mL  may be an effective concentration for many patients. However, some  are best treated at concentrations outside of this range. Acetaminophen concentrations >150 ug/mL at 4 hours after ingestion  and >50 ug/mL at 12 hours after ingestion are often associated with  toxic reactions.  Performed at [facility], [address] [place], [place] [address]   cbc     Status: Abnormal   Collection Time: 08/01/21  3:27 PM  Result Value Ref Range   WBC 3.9 (L) 4.0 - 10.5 K/uL   RBC 5.38 (H) 3.87 - 5.11 MIL/uL   Hemoglobin 13.3 12.0 - 15.0 g/dL   HCT 42.3 36.0 - 46.0 %   MCV 78.6 (L) 80.0 - 100.0 fL   MCH 24.7 (L) 26.0 - 34.0 pg   MCHC 31.4 30.0 - 36.0 g/dL   RDW 14.4 11.5 - 15.5 %   Platelets 344 150 - 400 K/uL   nRBC 0.0 0.0 - 0.2 %    Comment: Performed at [facility], [address] [place], [place] [address]  POC urine preg, ED     Status: None   Collection Time: 08/01/21  4:46 PM  Result Value Ref Range   Preg Test, Ur NEGATIVE NEGATIVE    Comment:        THE SENSITIVITY OF THIS METHODOLOGY IS >24 mIU/mL   Resp Panel by RT-PCR (Flu A&B, Covid) Nasopharyngeal Swab     Status: None   Collection Time: 08/01/21  7:42 PM   Specimen: Nasopharyngeal Swab; Nasopharyngeal(NP) swabs in vial transport medium  Result Value Ref Range   SARS Coronavirus  2 by RT PCR NEGATIVE NEGATIVE    Comment: (NOTE) SARS-CoV-2 target nucleic acids are NOT DETECTED.  The SARS-CoV-2 RNA is generally detectable in upper respiratory specimens during the acute phase of infection. The lowest concentration of SARS-CoV-2 viral copies this assay can detect is 138 copies/mL. A negative result does not preclude SARS-Cov-2 infection and should not be used as the sole basis for treatment or other patient management decisions. A negative result may occur with  improper specimen collection/handling, submission of specimen other than nasopharyngeal swab, presence  of viral mutation(s) within the areas targeted by this assay, and inadequate number of viral copies(<138 copies/mL). A negative result must be combined with clinical observations, patient history, and epidemiological information. The expected result is Negative.  Fact Sheet for Patients:  EntrepreneurPulse.com.au  Fact Sheet for Healthcare Providers:  IncredibleEmployment.be  This test is no t yet approved or cleared by the [place] FDA and  has been authorized for detection and/or diagnosis of SARS-CoV-2 by FDA under an Emergency Use Authorization (EUA). This EUA will remain  in effect (meaning this test can be used) for the duration of the COVID-19 declaration under Section 564(b)(1) of the Act, 21 U.S.C.section 360bbb-3(b)(1), unless the authorization is terminated  or revoked sooner.       Influenza A by PCR NEGATIVE NEGATIVE   Influenza B by PCR NEGATIVE NEGATIVE    Comment: (NOTE) The Xpert Xpress SARS-CoV-2/FLU/RSV plus assay is intended as an aid in the diagnosis of influenza from Nasopharyngeal swab specimens and should not be used as a sole basis for treatment. Nasal washings and aspirates are unacceptable for Xpert Xpress SARS-CoV-2/FLU/RSV testing.  Fact Sheet for Patients: EntrepreneurPulse.com.au  Fact Sheet for Healthcare  Providers: IncredibleEmployment.be  This test is not yet approved or cleared by the [place] FDA and has been authorized for detection and/or diagnosis of SARS-CoV-2 by FDA under an Emergency Use Authorization (EUA). This EUA will remain in effect (meaning this test can be used) for the duration of the COVID-19 declaration under Section 564(b)(1) of the Act, 21 U.S.C. section 360bbb-3(b)(1), unless the authorization is terminated or revoked.  Performed at [facility], [address] [place], [place] [address]     Blood Alcohol level:  Lab Results  Component Value Date   [facility] <10 08/01/2021    Physical Findings: AIMS:  , ,  ,  ,    CIWA:    COWS:     Musculoskeletal: Strength & Muscle Tone: within normal limits Gait & Station: normal Patient leans: N/A  Psychiatric Specialty Exam:  Presentation  General Appearance: Appropriate for Environment  Eye Contact:Good  Speech:Clear and Coherent Speech Volume:Normal Handedness:No data recorded  Mood and Affect  Mood:Anxious  Affect:Congruent   Thought Process  Thought Processes:Goal Directed  Descriptions of Associations:Intact  Orientation:Full (Time, Place and Person)  Thought Content:WDL  History of Schizophrenia/Schizoaffective disorder:No  Duration of Psychotic Symptoms:No data recorded Hallucinations:Hallucinations: None  Ideas of Reference:None  Suicidal Thoughts:Suicidal Thoughts: No  Homicidal Thoughts:Homicidal Thoughts: No   Sensorium  Memory:Immediate Good  Judgment:Fair  Insight:Fair   Executive Functions  Concentration:Fair  Attention Span:Fair  [place]   Psychomotor Activity  Psychomotor Activity:Psychomotor Activity: Normal   Assets  Assets:Desire for Improvement; Financial Resources/Insurance; Housing; Resilience   Sleep  Sleep:Sleep: Fair Number of Hours of Sleep: 6    Physical  Exam: Physical Exam Vitals and nursing note reviewed.  HENT:     Head: Normocephalic.     Nose: No congestion or rhinorrhea.  Eyes:     General:        Right eye: No discharge.        Left eye: No discharge.  Cardiovascular:     Rate and Rhythm: Normal rate.  Pulmonary:     Effort: Pulmonary effort is normal.  Musculoskeletal:        General: Normal range of motion.     Cervical back: Normal range of motion.  Skin:    General: Skin is dry.  Neurological:  Mental Status: She is alert and oriented to person, place, and time.   Review of Systems  Psychiatric/Behavioral:  Positive for depression (stable). Negative for hallucinations, memory loss, substance abuse and suicidal ideas. The patient is not nervous/anxious and does not have insomnia.   All other systems reviewed and are negative. Blood pressure (!) 149/90, pulse 72, temperature 98.6 F (37 C), temperature source Oral, resp. rate 16, height 5' (1.524 m), weight 79.4 kg, last menstrual period 07/30/2021, SpO2 91 %. Body mass index is 34.18 kg/m.  Treatment Plan Summary: Plan Patient is alert and oriented x 4. Denies SI/HI/AVH. She may be a bit paranoid surrounding actions of apartment complex management trespassing her boyfriend, stating that she thinks it could be racism. Per Donney Dice  at [facility], who knows patient, the patient is somewhat paranoid at baseline. Patient does not presenting as if a danger to herself or others at this time. Recommend discharge home.  Reviewed with EDP  Sherlon Handing, NP 08/02/2021, 11:53 AM

## 2021-08-04 ENCOUNTER — Other Ambulatory Visit: Payer: Self-pay

## 2021-08-04 ENCOUNTER — Other Ambulatory Visit: Payer: Self-pay | Admitting: Gerontology

## 2021-08-05 ENCOUNTER — Other Ambulatory Visit: Payer: Self-pay

## 2021-08-09 ENCOUNTER — Other Ambulatory Visit: Payer: Self-pay

## 2021-08-09 ENCOUNTER — Encounter: Payer: Self-pay | Admitting: Oncology

## 2021-08-09 MED ORDER — DULOXETINE HCL 30 MG PO CPEP
30.0000 mg | ORAL_CAPSULE | Freq: Three times a day (TID) | ORAL | 1 refills | Status: DC
  Filled 2021-08-09: qty 90, 30d supply, fill #0
  Filled 2021-09-19: qty 180, 60d supply, fill #0

## 2021-08-09 MED ORDER — HYDROXYZINE HCL 25 MG PO TABS
25.0000 mg | ORAL_TABLET | Freq: Two times a day (BID) | ORAL | 1 refills | Status: DC | PRN
  Filled 2021-08-09: qty 60, 30d supply, fill #0

## 2021-08-09 MED ORDER — ARIPIPRAZOLE 5 MG PO TABS
10.0000 mg | ORAL_TABLET | Freq: Every day | ORAL | 1 refills | Status: DC
  Filled 2021-08-09 – 2021-09-02 (×2): qty 60, 30d supply, fill #0
  Filled 2021-09-02: qty 60, 30d supply, fill #1

## 2021-08-09 MED ORDER — TRAZODONE HCL 50 MG PO TABS
50.0000 mg | ORAL_TABLET | Freq: Every evening | ORAL | 1 refills | Status: DC | PRN
  Filled 2021-08-09: qty 30, 30d supply, fill #0

## 2021-08-10 ENCOUNTER — Other Ambulatory Visit: Payer: Self-pay

## 2021-08-15 ENCOUNTER — Other Ambulatory Visit: Payer: Self-pay

## 2021-08-15 ENCOUNTER — Inpatient Hospital Stay: Payer: Medicaid Other | Attending: Oncology

## 2021-08-15 DIAGNOSIS — Z79899 Other long term (current) drug therapy: Secondary | ICD-10-CM | POA: Insufficient documentation

## 2021-08-15 DIAGNOSIS — N920 Excessive and frequent menstruation with regular cycle: Secondary | ICD-10-CM | POA: Insufficient documentation

## 2021-08-15 DIAGNOSIS — D509 Iron deficiency anemia, unspecified: Secondary | ICD-10-CM

## 2021-08-15 DIAGNOSIS — D500 Iron deficiency anemia secondary to blood loss (chronic): Secondary | ICD-10-CM | POA: Insufficient documentation

## 2021-08-15 LAB — CBC WITH DIFFERENTIAL/PLATELET
Abs Immature Granulocytes: 0.01 10*3/uL (ref 0.00–0.07)
Basophils Absolute: 0 10*3/uL (ref 0.0–0.1)
Basophils Relative: 1 %
Eosinophils Absolute: 0.1 10*3/uL (ref 0.0–0.5)
Eosinophils Relative: 2 %
HCT: 41.5 % (ref 36.0–46.0)
Hemoglobin: 13.1 g/dL (ref 12.0–15.0)
Immature Granulocytes: 0 %
Lymphocytes Relative: 35 %
Lymphs Abs: 1.4 10*3/uL (ref 0.7–4.0)
MCH: 24.6 pg — ABNORMAL LOW (ref 26.0–34.0)
MCHC: 31.6 g/dL (ref 30.0–36.0)
MCV: 77.9 fL — ABNORMAL LOW (ref 80.0–100.0)
Monocytes Absolute: 0.2 10*3/uL (ref 0.1–1.0)
Monocytes Relative: 6 %
Neutro Abs: 2.2 10*3/uL (ref 1.7–7.7)
Neutrophils Relative %: 56 %
Platelets: 274 10*3/uL (ref 150–400)
RBC: 5.33 MIL/uL — ABNORMAL HIGH (ref 3.87–5.11)
RDW: 14.8 % (ref 11.5–15.5)
WBC: 4.1 10*3/uL (ref 4.0–10.5)
nRBC: 0 % (ref 0.0–0.2)

## 2021-08-15 LAB — IRON AND TIBC
Iron: 31 ug/dL (ref 28–170)
Saturation Ratios: 10 % — ABNORMAL LOW (ref 10.4–31.8)
TIBC: 309 ug/dL (ref 250–450)
UIBC: 278 ug/dL

## 2021-08-15 LAB — FERRITIN: Ferritin: 78 ng/mL (ref 11–307)

## 2021-08-16 ENCOUNTER — Encounter: Payer: Self-pay | Admitting: Oncology

## 2021-08-16 ENCOUNTER — Inpatient Hospital Stay (HOSPITAL_BASED_OUTPATIENT_CLINIC_OR_DEPARTMENT_OTHER): Payer: Self-pay | Admitting: Oncology

## 2021-08-16 DIAGNOSIS — D509 Iron deficiency anemia, unspecified: Secondary | ICD-10-CM

## 2021-08-16 NOTE — Progress Notes (Signed)
two weeks ago--traumatic event in her life; couldn't handle it; personal family matter; went to mental health [facility] called a crisus; went to psych ward for 24 hours; was very traumatic because all she wanted to do was speak to someone and they admitted her; was dx was paranoia and she believes she is not being heard by her healthcare providers or the facility and believes she was misdiagnosed; she has been dealing with lots of racism and all she wanted to do was speak to anyone. Is not sure that she should be taking ABILIFY. Pt seems to be very stressed on the phone.

## 2021-08-18 ENCOUNTER — Other Ambulatory Visit: Payer: Self-pay

## 2021-08-18 ENCOUNTER — Telehealth: Payer: Self-pay | Admitting: Pharmacy Technician

## 2021-08-18 ENCOUNTER — Encounter: Payer: Self-pay | Admitting: Oncology

## 2021-08-18 NOTE — Telephone Encounter (Signed)
Received updated proof of income.  Patient eligible to receive medication assistance at [facility] until time for re-certification in 2024, and as long as eligibility requirements continue to be met.  Dana Dunlap Care Manager [facility]  

## 2021-08-18 NOTE — Progress Notes (Signed)
I connected with [name] on 08/18/21 at  2:45 PM EDT by video enabled telemedicine visit and verified that I am speaking with the correct person using two identifiers.   I discussed the limitations, risks, security and privacy concerns of performing an evaluation and management service by telemedicine and the availability of in-person appointments. I also discussed with the patient that there may be a patient responsible charge related to this service. The patient expressed understanding and agreed to proceed.  Other persons participating in the visit and their role in the encounter:  none  Patient's location:  home Provider's location:  work  Risk analyst Complaint:  routine f/u of anemia  History of present illness: Patient is a 51 year old African-American female who was seen by Dr. Grayland Ormond for iron deficiency anemia and has required IV iron in the past.  She is transferring her care to me.  Etiology of iron deficiency anemia has been related to be heavy menstrual bleeding secondary to fibroids.  Patient also has history of thrombocytosis which has been attributed to iron deficiency.  Patient has concerns that she has sickle cell disease but on hemoglobinopathy evaluation was normal in 2018.   Patient has not had any menstrual cycles since May 2022.  She does have a history of uterine fibroids but has not required any surgical intervention for it yet.  Interval history Patient reports having tremendous mental stress recently. She was also in the ER for involuntary commitment. Denies any suicidal or homicidal ideations   Review of Systems  Constitutional:  Positive for malaise/fatigue. Negative for chills, fever and weight loss.  HENT:  Negative for congestion, ear discharge and nosebleeds.   Eyes:  Negative for blurred vision.  Respiratory:  Negative for cough, hemoptysis, sputum production, shortness of breath and wheezing.   Cardiovascular:  Negative for chest pain, palpitations,  orthopnea and claudication.  Gastrointestinal:  Negative for abdominal pain, blood in stool, constipation, diarrhea, heartburn, melena, nausea and vomiting.  Genitourinary:  Negative for dysuria, flank pain, frequency, hematuria and urgency.  Musculoskeletal:  Negative for back pain, joint pain and myalgias.  Skin:  Negative for rash.  Neurological:  Negative for dizziness, tingling, focal weakness, seizures, weakness and headaches.  Endo/Heme/Allergies:  Does not bruise/bleed easily.  Psychiatric/Behavioral:  Negative for depression and suicidal ideas. The patient does not have insomnia.    Allergies  Allergen Reactions   Prochlorperazine Anaphylaxis and Other (See Comments)    Seizure Seizure Other reaction(s): Other (See Comments), Other (See Comments), Other (See Comments) Seizure Seizure Seizure Seizure AKA Prozac  Other reaction(s):  Seizure Seizure   Tramadol Hives   Compazine [Prochlorperazine Edisylate] Other (See Comments)    Seizure    Other     Sneezing and red eyes    Past Medical History:  Diagnosis Date   Anemia    Depression    Currently on Cymbalta   Eye abnormalities    Fibroids    Frequent headaches    Worse during monthly menstrual cycle   Hypertension     Past Surgical History:  Procedure Laterality Date   BREAST MASS EXCISION     benign   KNEE SURGERY      Social History   Socioeconomic History   Marital status: Single    Spouse name: Not on file   Number of children: 0   Years of education: 16   Highest education level: Bachelor's degree (e.g., BA, AB, BS)  Occupational History   Occupation: Psychologist, clinical:  [employer]    Comment: Molecular biology  Tobacco Use   Smoking status: Never   Smokeless tobacco: Never  Vaping Use   Vaping Use: Never used  Substance and Sexual Activity   Alcohol use: Yes    Alcohol/week: 1.0 standard drink    Types: 1 Glasses of wine per week   Drug use: No   Sexual activity: Yes    Birth  control/protection: Condom  Other Topics Concern   Not on file  Social History Narrative   Orpah is originally from [place]. She attended [employer] in [place] where she obtained her Therapist, nutritional in Biology in 1995. 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 She is in a long term relationship with her boyfried, [name]. They have been together for 6 years. [name] transferred to [facility] with [facility] in January. She enjoys reading and she enjoys the outdoors. She loves to travel. 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   Social Determinants of Health   Financial Resource Strain: Not on file  Food Insecurity: Not on file  Transportation Needs: Not on file  Physical Activity: Not on file  Stress: Not on file  Social Connections: Not on file  Intimate Partner Violence: Not on file    Family History  Problem Relation Age of Onset   Hypertension Mother    Hyperlipidemia Mother    Stroke Mother    Cancer Father        Prostate cancer   Hypertension Father    Hyperlipidemia Father    Fibroids Sister    Sickle cell trait Sister    Sickle cell trait Brother    Fibroids Sister    Stroke Paternal Aunt    Diabetes Paternal Aunt    Sickle cell trait Other    Thalassemia Other      Current Outpatient Medications:    acetaminophen (TYLENOL) 325 MG tablet, Take 650 mg by mouth every 6 (six) hours as needed. Patient taking twice weekly, Disp: , Rfl:    amLODipine (NORVASC) 5 MG tablet, Take 1 tablet (5 mg total) by mouth once daily., Disp: 90 tablet, Rfl: 2   ARIPiprazole (ABILIFY) 5 MG tablet, Take 2 tablets (10 mg total) by mouth once nightly at bedtime., Disp: 60 tablet, Rfl: 1   atorvastatin (LIPITOR) 10 MG tablet, Take 1 tablet (10 mg total) by mouth once daily., Disp: 90 tablet, Rfl: 2   DULoxetine (CYMBALTA) 30 MG capsule, Take 1 capsule (30 mg total) by mouth 3 (three) times daily.,  Disp: 90 capsule, Rfl: 1   ferrous sulfate 325 (65 FE) MG tablet, TAKE ONE TABLET BY MOUTH EVERY DAY WTH BREAKFAST, Disp: 60 tablet, Rfl: 1   fluticasone (FLONASE) 50 MCG/ACT nasal spray, PLACE 1 SPRAY INTO BOTH NOSTRILS ONCE DAILY, Disp: 16 g, Rfl: 2   hydrOXYzine (ATARAX) 25 MG tablet, Take 1 tablet (25 mg total) by mouth 2 (two) times daily as needed., Disp: 60 tablet, Rfl: 1   Multiple Vitamin (MULTIVITAMIN) capsule, Take 1 capsule by mouth daily., Disp: 30 capsule, Rfl: 1   sodium chloride (OCEAN) 0.65 % SOLN nasal spray, Place 1 spray into both nostrils as needed for congestion., Disp: 30 mL, Rfl: 0   traZODone (DESYREL) 50 MG tablet, Take 1 tablet (50 mg total) by mouth once nightly at bedtime as needed., Disp: 30 tablet, Rfl: 1   acyclovir (ZOVIRAX) 400 MG tablet, Take  1 tablet (400 mg total) by mouth Three (3) times a day for 7 days. (Patient not taking: Reported on 08/01/2021), Disp: 21 tablet, Rfl: 0   Blood Pressure Monitoring (BLOOD PRESSURE KIT) DEVI, 1 kit by Does not apply route daily., Disp: 1 each, Rfl: 0   mupirocin ointment (BACTROBAN) 2 %, Apply topically to the affected area(s) Three (3) times a day for 7 days. (Patient not taking: Reported on 08/01/2021), Disp: 30 g, Rfl: 1  CT Head Wo Contrast  Result Date: 08/01/2021 CLINICAL DATA:  Delirium, paranoia EXAM: CT HEAD WITHOUT CONTRAST TECHNIQUE: Contiguous axial images were obtained from the base of the skull through the vertex without intravenous contrast. RADIATION DOSE REDUCTION: This exam was performed according to the departmental dose-optimization program which includes automated exposure control, adjustment of the mA and/or kV according to patient size and/or use of iterative reconstruction technique. COMPARISON:  None. FINDINGS: Brain: No evidence of acute infarction, hemorrhage, hydrocephalus, extra-axial collection or mass lesion/mass effect. Vascular: No hyperdense vessel or unexpected calcification. Skull: Normal. Negative  for fracture or focal lesion. Sinuses/Orbits: No acute finding. Other: None. IMPRESSION: No acute intracranial process. Electronically Signed   By: Davina Poke D.O.   On: 08/01/2021 17:34    No images are attached to the encounter.      Latest Ref Rng & Units 08/01/2021    3:27 PM  CMP  Glucose 70 - 99 mg/dL 133    BUN 6 - 20 mg/dL 8    Creatinine 0.44 - 1.00 mg/dL 0.99    Sodium 135 - 145 mmol/L 140    Potassium 3.5 - 5.1 mmol/L 3.8    Chloride 98 - 111 mmol/L 105    CO2 22 - 32 mmol/L 27    Calcium 8.9 - 10.3 mg/dL 9.9    Total Protein 6.5 - 8.1 g/dL 8.2    Total Bilirubin 0.3 - 1.2 mg/dL 0.4    Alkaline Phos 38 - 126 U/L 73    AST 15 - 41 U/L 21    ALT 0 - 44 U/L 15        Latest Ref Rng & Units 08/15/2021    1:45 PM  CBC  WBC 4.0 - 10.5 K/uL 4.1    Hemoglobin 12.0 - 15.0 g/dL 13.1    Hematocrit 36.0 - 46.0 % 41.5    Platelets 150 - 400 K/uL 274       Observation/objective:appears in no acute distress over video vsiit today. Breathing is non labored  Assessment and plan:Patient is a 51 yr old female here for routine f/u of iron deficiency anemia  Although there is some microcytosis, she is not anemic. Hb is 13.1. ferritin is low but iron saturation is low at 10%. She is ok with holding off on IV iron at this time given her personal situation  Follow-up instructions:cbc ferritin and iron studies in 3 and 6 months,. See dr Janese Banks in 6 months  I discussed the assessment and treatment plan with the patient. The patient was provided an opportunity to ask questions and all were answered. The patient agreed with the plan and demonstrated an understanding of the instructions.   The patient was advised to call back or seek an in-person evaluation if the symptoms worsen or if the condition fails to improve as anticipated.  Visit Diagnosis: 1. Iron deficiency anemia, unspecified iron deficiency anemia type     Dr. Randa Evens, MD, MPH [facility] Tel- [phone] 08/18/2021 12:48  PM

## 2021-08-19 ENCOUNTER — Telehealth: Payer: Self-pay | Admitting: Pharmacist

## 2021-08-19 ENCOUNTER — Encounter: Payer: Self-pay | Admitting: Oncology

## 2021-08-19 NOTE — Telephone Encounter (Signed)
08/19/2021 9:21:35 AM - Cymbalta approval -- Arletha Pili - Friday, Aug 19, 2021 9:19 AM --Received approval letter from [facility] for Cymbalta. Patient is enrolled for a 12 month period.

## 2021-08-26 ENCOUNTER — Encounter: Payer: Self-pay | Admitting: Oncology

## 2021-08-26 ENCOUNTER — Other Ambulatory Visit: Payer: Self-pay

## 2021-08-26 MED ORDER — MELOXICAM 15 MG PO TABS
ORAL_TABLET | ORAL | 0 refills | Status: DC
Start: 2021-08-26 — End: 2023-02-21
  Filled 2021-08-26: qty 30, 30d supply, fill #0

## 2021-09-02 ENCOUNTER — Other Ambulatory Visit: Payer: Self-pay

## 2021-09-02 ENCOUNTER — Encounter: Payer: Self-pay | Admitting: Oncology

## 2021-09-19 ENCOUNTER — Encounter: Payer: Self-pay | Admitting: Oncology

## 2021-09-19 ENCOUNTER — Other Ambulatory Visit: Payer: Self-pay

## 2021-09-22 ENCOUNTER — Encounter: Payer: Self-pay | Admitting: Oncology

## 2021-09-26 ENCOUNTER — Encounter: Payer: Self-pay | Admitting: Oncology

## 2021-09-26 ENCOUNTER — Other Ambulatory Visit: Payer: Self-pay

## 2021-09-26 ENCOUNTER — Other Ambulatory Visit: Payer: Medicaid Other

## 2021-09-27 ENCOUNTER — Ambulatory Visit: Payer: Medicaid Other

## 2021-09-27 DIAGNOSIS — Z130 Encounter for screening for diseases of the blood and blood-forming organs and certain disorders involving the immune mechanism: Secondary | ICD-10-CM

## 2021-10-05 ENCOUNTER — Other Ambulatory Visit: Payer: Self-pay

## 2021-10-06 ENCOUNTER — Telehealth: Payer: Self-pay

## 2021-10-06 NOTE — Telephone Encounter (Signed)
Reached client by phone regarding test results. Client will pick up results on 10/07/2021 at the information booth.  ROI on file.   Patient had lots of questions and was referred to [facility] related to family history.  Venice Liz Shelda Pal, RN

## 2021-10-06 NOTE — Telephone Encounter (Signed)
Attempted to reach client by phone to inform her that her test results were available.  No Answer, and Mailbox is full.  Yaakov Saindon Shelda Pal, RN

## 2021-10-11 ENCOUNTER — Other Ambulatory Visit: Payer: Self-pay

## 2021-10-12 ENCOUNTER — Other Ambulatory Visit (HOSPITAL_BASED_OUTPATIENT_CLINIC_OR_DEPARTMENT_OTHER): Payer: Self-pay

## 2021-10-12 ENCOUNTER — Encounter: Payer: Self-pay | Admitting: Oncology

## 2021-10-12 ENCOUNTER — Other Ambulatory Visit: Payer: Self-pay

## 2021-10-12 MED ORDER — NYSTATIN-TRIAMCINOLONE 100000-0.1 UNIT/GM-% EX OINT
TOPICAL_OINTMENT | CUTANEOUS | 3 refills | Status: DC
  Filled 2021-10-12 – 2021-10-21 (×2): qty 30, 15d supply, fill #0
  Filled 2022-02-21: qty 30, 30d supply, fill #0

## 2021-10-12 MED ORDER — AMLODIPINE BESYLATE 5 MG PO TABS
ORAL_TABLET | ORAL | 1 refills | Status: DC
  Filled 2021-10-12 – 2022-01-27 (×2): qty 90, 90d supply, fill #0

## 2021-10-12 MED ORDER — AMLODIPINE BESYLATE 5 MG PO TABS
ORAL_TABLET | ORAL | 1 refills | Status: DC
  Filled 2021-10-12 (×2): qty 90, 90d supply, fill #0
  Filled 2022-02-21: qty 90, 90d supply, fill #1

## 2021-10-12 MED ORDER — NYSTATIN-TRIAMCINOLONE 100000-0.1 UNIT/GM-% EX OINT
TOPICAL_OINTMENT | CUTANEOUS | 3 refills | Status: DC
  Filled 2021-10-12: qty 30, 10d supply, fill #0

## 2021-10-17 ENCOUNTER — Encounter: Payer: Self-pay | Admitting: Oncology

## 2021-10-17 ENCOUNTER — Other Ambulatory Visit: Payer: Self-pay

## 2021-10-17 MED ORDER — ARIPIPRAZOLE 10 MG PO TABS
ORAL_TABLET | ORAL | 1 refills | Status: DC
  Filled 2021-10-17 – 2021-11-11 (×2): qty 30, 30d supply, fill #0
  Filled 2022-02-21 – 2022-03-08 (×2): qty 30, 30d supply, fill #1

## 2021-10-17 MED ORDER — TRAZODONE HCL 50 MG PO TABS
ORAL_TABLET | ORAL | 1 refills | Status: DC
  Filled 2021-10-17 – 2022-01-27 (×2): qty 30, 30d supply, fill #0
  Filled 2022-02-21: qty 30, 30d supply, fill #1

## 2021-10-17 MED ORDER — DULOXETINE HCL 30 MG PO CPEP
ORAL_CAPSULE | ORAL | 1 refills | Status: DC
  Filled 2021-10-17: qty 90, 30d supply, fill #0
  Filled 2021-10-18 – 2021-10-21 (×3): qty 90, fill #0
  Filled 2022-02-21: qty 90, 30d supply, fill #0

## 2021-10-17 MED ORDER — HYDROXYZINE HCL 25 MG PO TABS
ORAL_TABLET | ORAL | 1 refills | Status: DC
  Filled 2021-10-17 – 2022-01-27 (×2): qty 60, 30d supply, fill #0
  Filled 2022-02-21: qty 60, 30d supply, fill #1

## 2021-10-18 ENCOUNTER — Other Ambulatory Visit (HOSPITAL_BASED_OUTPATIENT_CLINIC_OR_DEPARTMENT_OTHER): Payer: Self-pay

## 2021-10-18 ENCOUNTER — Other Ambulatory Visit: Payer: Self-pay

## 2021-10-20 ENCOUNTER — Other Ambulatory Visit: Payer: Self-pay

## 2021-10-21 ENCOUNTER — Other Ambulatory Visit: Payer: Self-pay

## 2021-10-24 ENCOUNTER — Other Ambulatory Visit: Payer: Self-pay

## 2021-10-28 ENCOUNTER — Other Ambulatory Visit: Payer: Self-pay

## 2021-11-01 ENCOUNTER — Other Ambulatory Visit: Payer: Self-pay

## 2021-11-10 ENCOUNTER — Other Ambulatory Visit: Payer: Self-pay

## 2021-11-11 ENCOUNTER — Other Ambulatory Visit: Payer: Self-pay

## 2021-11-11 ENCOUNTER — Encounter: Payer: Self-pay | Admitting: Oncology

## 2021-11-14 ENCOUNTER — Other Ambulatory Visit: Payer: Self-pay

## 2021-11-15 ENCOUNTER — Other Ambulatory Visit: Payer: Self-pay

## 2021-11-16 ENCOUNTER — Inpatient Hospital Stay: Payer: Self-pay | Attending: Oncology

## 2021-11-16 ENCOUNTER — Other Ambulatory Visit: Payer: Self-pay

## 2021-11-17 ENCOUNTER — Other Ambulatory Visit: Payer: Self-pay

## 2022-01-06 ENCOUNTER — Telehealth: Payer: Self-pay | Admitting: *Deleted

## 2022-01-06 NOTE — Telephone Encounter (Signed)
Please call that above number and see what they want from me and how I can help

## 2022-01-06 NOTE — Telephone Encounter (Signed)
Dr Billie Lade nurse called stating that Dr Billie Lade needs to do a peer to peer with Dr Janese Banks regarding this patient disability claim. Please call at [phone]

## 2022-01-06 NOTE — Telephone Encounter (Signed)
Dana Dunlap- I do not recall doing disability for her. Can you please check?

## 2022-01-11 ENCOUNTER — Encounter: Payer: Self-pay | Admitting: Oncology

## 2022-01-12 ENCOUNTER — Telehealth: Payer: Self-pay

## 2022-01-12 NOTE — Telephone Encounter (Signed)
Frances Furbish called from Dr. Bethena Roys Schmidt's office regarding patient.  I let Frances Furbish know that this is not our patient.

## 2022-01-23 ENCOUNTER — Other Ambulatory Visit: Payer: Self-pay

## 2022-01-27 ENCOUNTER — Other Ambulatory Visit: Payer: Self-pay

## 2022-01-27 ENCOUNTER — Encounter: Payer: Self-pay | Admitting: Oncology

## 2022-02-17 ENCOUNTER — Inpatient Hospital Stay: Payer: Medicaid Other | Admitting: Oncology

## 2022-02-17 ENCOUNTER — Inpatient Hospital Stay: Payer: Medicaid Other | Attending: Oncology

## 2022-02-20 ENCOUNTER — Encounter: Payer: Self-pay | Admitting: Oncology

## 2022-02-21 ENCOUNTER — Encounter: Payer: Self-pay | Admitting: Oncology

## 2022-02-21 ENCOUNTER — Other Ambulatory Visit (HOSPITAL_BASED_OUTPATIENT_CLINIC_OR_DEPARTMENT_OTHER): Payer: Self-pay

## 2022-02-21 ENCOUNTER — Other Ambulatory Visit: Payer: Self-pay | Admitting: Gerontology

## 2022-02-21 ENCOUNTER — Other Ambulatory Visit: Payer: Self-pay

## 2022-02-21 DIAGNOSIS — R0981 Nasal congestion: Secondary | ICD-10-CM

## 2022-02-21 MED ORDER — AMLODIPINE BESYLATE 5 MG PO TABS
5.0000 mg | ORAL_TABLET | Freq: Every day | ORAL | 1 refills | Status: DC
  Filled 2022-02-21 – 2022-05-03 (×2): qty 90, 90d supply, fill #0

## 2022-02-21 MED ORDER — ATORVASTATIN CALCIUM 10 MG PO TABS
10.0000 mg | ORAL_TABLET | Freq: Every day | ORAL | 0 refills | Status: DC
  Filled 2022-08-29: qty 90, 90d supply, fill #0

## 2022-02-24 ENCOUNTER — Other Ambulatory Visit: Payer: Self-pay

## 2022-03-05 ENCOUNTER — Other Ambulatory Visit: Payer: Self-pay

## 2022-03-06 ENCOUNTER — Other Ambulatory Visit: Payer: Self-pay

## 2022-03-08 ENCOUNTER — Other Ambulatory Visit: Payer: Self-pay

## 2022-03-08 ENCOUNTER — Encounter: Payer: Self-pay | Admitting: Oncology

## 2022-03-09 ENCOUNTER — Encounter: Payer: Self-pay | Admitting: Oncology

## 2022-03-09 ENCOUNTER — Other Ambulatory Visit: Payer: Self-pay

## 2022-03-17 ENCOUNTER — Other Ambulatory Visit: Payer: Self-pay

## 2022-03-21 ENCOUNTER — Other Ambulatory Visit: Payer: Self-pay

## 2022-03-23 ENCOUNTER — Other Ambulatory Visit: Payer: Self-pay

## 2022-03-24 ENCOUNTER — Other Ambulatory Visit: Payer: Self-pay

## 2022-03-28 ENCOUNTER — Other Ambulatory Visit: Payer: Self-pay

## 2022-03-30 ENCOUNTER — Other Ambulatory Visit: Payer: Self-pay

## 2022-04-07 ENCOUNTER — Other Ambulatory Visit: Payer: Self-pay

## 2022-04-10 ENCOUNTER — Other Ambulatory Visit: Payer: Self-pay

## 2022-04-11 ENCOUNTER — Other Ambulatory Visit: Payer: Self-pay

## 2022-04-14 ENCOUNTER — Other Ambulatory Visit: Payer: Self-pay

## 2022-04-20 ENCOUNTER — Other Ambulatory Visit: Payer: Self-pay

## 2022-04-21 ENCOUNTER — Other Ambulatory Visit: Payer: Self-pay

## 2022-04-25 ENCOUNTER — Other Ambulatory Visit: Payer: Self-pay

## 2022-04-25 MED ORDER — DULOXETINE HCL 30 MG PO CPEP
30.0000 mg | ORAL_CAPSULE | Freq: Three times a day (TID) | ORAL | 0 refills | Status: DC
  Filled 2022-05-03: qty 180, 60d supply, fill #0

## 2022-04-25 MED ORDER — HYDROXYZINE HCL 25 MG PO TABS
25.0000 mg | ORAL_TABLET | Freq: Two times a day (BID) | ORAL | 3 refills | Status: DC | PRN
  Filled 2022-05-03: qty 60, 30d supply, fill #0
  Filled 2022-08-29: qty 60, 30d supply, fill #1
  Filled 2022-12-08: qty 60, 30d supply, fill #2
  Filled 2023-02-08: qty 60, 30d supply, fill #3

## 2022-04-25 MED ORDER — ATORVASTATIN CALCIUM 10 MG PO TABS
10.0000 mg | ORAL_TABLET | Freq: Every day | ORAL | 0 refills | Status: DC
  Filled 2022-05-03: qty 90, 90d supply, fill #0

## 2022-04-25 MED ORDER — TRAZODONE HCL 50 MG PO TABS
50.0000 mg | ORAL_TABLET | Freq: Every day | ORAL | 1 refills | Status: DC
  Filled 2022-05-03: qty 90, 90d supply, fill #0
  Filled 2022-09-01: qty 90, 90d supply, fill #1

## 2022-04-25 MED ORDER — AMLODIPINE BESYLATE 10 MG PO TABS
10.0000 mg | ORAL_TABLET | Freq: Every day | ORAL | 3 refills | Status: DC
  Filled 2022-05-03: qty 90, 90d supply, fill #0
  Filled 2022-08-29: qty 90, 90d supply, fill #1

## 2022-04-29 ENCOUNTER — Other Ambulatory Visit: Payer: Self-pay

## 2022-05-02 ENCOUNTER — Encounter: Payer: Self-pay | Admitting: Oncology

## 2022-05-02 ENCOUNTER — Other Ambulatory Visit: Payer: Self-pay

## 2022-05-03 ENCOUNTER — Other Ambulatory Visit (HOSPITAL_BASED_OUTPATIENT_CLINIC_OR_DEPARTMENT_OTHER): Payer: Self-pay

## 2022-05-03 ENCOUNTER — Other Ambulatory Visit: Payer: Self-pay

## 2022-05-04 ENCOUNTER — Other Ambulatory Visit (HOSPITAL_BASED_OUTPATIENT_CLINIC_OR_DEPARTMENT_OTHER): Payer: Self-pay

## 2022-05-04 ENCOUNTER — Other Ambulatory Visit: Payer: Self-pay

## 2022-05-05 ENCOUNTER — Other Ambulatory Visit: Payer: Self-pay

## 2022-05-08 ENCOUNTER — Other Ambulatory Visit: Payer: Self-pay

## 2022-05-09 ENCOUNTER — Other Ambulatory Visit: Payer: Self-pay

## 2022-05-10 ENCOUNTER — Other Ambulatory Visit: Payer: Self-pay

## 2022-05-11 ENCOUNTER — Other Ambulatory Visit: Payer: Self-pay

## 2022-05-12 ENCOUNTER — Other Ambulatory Visit: Payer: Self-pay

## 2022-05-15 ENCOUNTER — Other Ambulatory Visit: Payer: Self-pay

## 2022-05-16 ENCOUNTER — Other Ambulatory Visit (HOSPITAL_COMMUNITY): Payer: Self-pay

## 2022-06-21 ENCOUNTER — Ambulatory Visit (INDEPENDENT_AMBULATORY_CARE_PROVIDER_SITE_OTHER): Payer: Medicaid Other | Admitting: Cardiovascular Disease

## 2022-06-21 ENCOUNTER — Encounter (HOSPITAL_BASED_OUTPATIENT_CLINIC_OR_DEPARTMENT_OTHER): Payer: Self-pay | Admitting: Cardiovascular Disease

## 2022-06-21 VITALS — BP 138/70 | HR 61 | Ht 60.0 in | Wt 180.0 lb

## 2022-06-21 DIAGNOSIS — E7849 Other hyperlipidemia: Secondary | ICD-10-CM

## 2022-06-21 DIAGNOSIS — I10 Essential (primary) hypertension: Secondary | ICD-10-CM

## 2022-06-21 NOTE — Patient Instructions (Signed)
Medication Instructions:  Your physician recommends that you continue on your current medications as directed. Please refer to the Current Medication list given to you today.    Labwork: [facility] TODAY    Testing/Procedures: CALCIUM SCORE-THIS WILL COST YOU $99 OUT OF POCKET  Your physician has requested that you have an echocardiogram. Echocardiography is a painless test that uses sound waves to create images of your heart. It provides your doctor with information about the size and shape of your heart and how well your heart's chambers and valves are working. This procedure takes approximately one hour. There are no restrictions for this procedure. Please do NOT wear cologne, perfume, aftershave, or lotions (deodorant is allowed). Please arrive 15 minutes prior to your appointment time.   Follow-Up: 08/03/2022 8:00 AM WITH CAITLIN W NP     Special Instructions:  AN EMAIL HAS BEEN SENT TO [facility]. YOU MAY ALSO STOP BY WHEN YOU LEAVE   MONITOR YOUR BLOOD PRESSURE TWICE A DAY, LOG IN THE BOOK PROVIDED. BRING THE BOOK AND YOUR BLOOD PRESSURE MACHINE TO YOUR FOLLOW UP   DASH Eating Plan DASH stands for "Dietary Approaches to Stop Hypertension." The DASH eating plan is a healthy eating plan that has been shown to reduce high blood pressure (hypertension). It may also reduce your risk for type 2 diabetes, heart disease, and stroke. The DASH eating plan may also help with weight loss. What are tips for following this plan?  General guidelines Avoid eating more than 2,300 mg (milligrams) of salt (sodium) a day. If you have hypertension, you may need to reduce your sodium intake to 1,500 mg a day. Limit alcohol intake to no more than 1 drink a day for nonpregnant women and 2 drinks a day for men. One drink equals 12 oz of beer, 5 oz of wine, or 1 oz of hard liquor. Work with your health care provider to maintain a healthy body weight or to lose weight. Ask what an ideal weight  is for you. Get at least 30 minutes of exercise that causes your heart to beat faster (aerobic exercise) most days of the week. Activities may include walking, swimming, or biking. Work with your health care provider or diet and nutrition specialist (dietitian) to adjust your eating plan to your individual calorie needs. Reading food labels  Check food labels for the amount of sodium per serving. Choose foods with less than 5 percent of the Daily Value of sodium. Generally, foods with less than 300 mg of sodium per serving fit into this eating plan. To find whole grains, look for the word "whole" as the first word in the ingredient list. Shopping Buy products labeled as "low-sodium" or "no salt added." Buy fresh foods. Avoid canned foods and premade or frozen meals. Cooking Avoid adding salt when cooking. Use salt-free seasonings or herbs instead of table salt or sea salt. Check with your health care provider or pharmacist before using salt substitutes. Do not fry foods. Cook foods using healthy methods such as baking, boiling, grilling, and broiling instead. Cook with heart-healthy oils, such as olive, canola, soybean, or sunflower oil. Meal planning Eat a balanced diet that includes: 5 or more servings of fruits and vegetables each day. At each meal, try to fill half of your plate with fruits and vegetables. Up to 6-8 servings of whole grains each day. Less than 6 oz of lean meat, poultry, or fish each day. A 3-oz serving of meat is about the same size  as a deck of cards. One egg equals 1 oz. 2 servings of low-fat dairy each day. A serving of nuts, seeds, or beans 5 times each week. Heart-healthy fats. Healthy fats called Omega-3 fatty acids are found in foods such as flaxseeds and coldwater fish, like sardines, salmon, and mackerel. Limit how much you eat of the following: Canned or prepackaged foods. Food that is high in trans fat, such as fried foods. Food that is high in saturated fat,  such as fatty meat. Sweets, desserts, sugary drinks, and other foods with added sugar. Full-fat dairy products. Do not salt foods before eating. Try to eat at least 2 vegetarian meals each week. Eat more home-cooked food and less restaurant, buffet, and fast food. When eating at a restaurant, ask that your food be prepared with less salt or no salt, if possible. What foods are recommended? The items listed may not be a complete list. Talk with your dietitian about what dietary choices are best for you. Grains Whole-grain or whole-wheat bread. Whole-grain or whole-wheat pasta. Brown rice. Modena Morrow. Bulgur. Whole-grain and low-sodium cereals. Pita bread. Low-fat, low-sodium crackers. Whole-wheat flour tortillas. Vegetables Fresh or frozen vegetables (raw, steamed, roasted, or grilled). Low-sodium or reduced-sodium tomato and vegetable juice. Low-sodium or reduced-sodium tomato sauce and tomato paste. Low-sodium or reduced-sodium canned vegetables. Fruits All fresh, dried, or frozen fruit. Canned fruit in natural juice (without added sugar). Meat and other protein foods Skinless chicken or [place]. Ground chicken or [place]. Pork with fat trimmed off. Fish and seafood. Egg whites. Dried beans, peas, or lentils. Unsalted nuts, nut butters, and seeds. Unsalted canned beans. Lean cuts of beef with fat trimmed off. Low-sodium, lean deli meat. Dairy Low-fat (1%) or fat-free (skim) milk. Fat-free, low-fat, or reduced-fat cheeses. Nonfat, low-sodium ricotta or cottage cheese. Low-fat or nonfat yogurt. Low-fat, low-sodium cheese. Fats and oils Soft margarine without trans fats. Vegetable oil. Low-fat, reduced-fat, or light mayonnaise and salad dressings (reduced-sodium). Canola, safflower, olive, soybean, and sunflower oils. Avocado. Seasoning and other foods Herbs. Spices. Seasoning mixes without salt. Unsalted popcorn and pretzels. Fat-free sweets. What foods are not recommended? The items listed  may not be a complete list. Talk with your dietitian about what dietary choices are best for you. Grains Baked goods made with fat, such as croissants, muffins, or some breads. Dry pasta or rice meal packs. Vegetables Creamed or fried vegetables. Vegetables in a cheese sauce. Regular canned vegetables (not low-sodium or reduced-sodium). Regular canned tomato sauce and paste (not low-sodium or reduced-sodium). Regular tomato and vegetable juice (not low-sodium or reduced-sodium). Angie Fava. Olives. Fruits Canned fruit in a light or heavy syrup. Fried fruit. Fruit in cream or butter sauce. Meat and other protein foods Fatty cuts of meat. Ribs. Fried meat. Berniece Salines. Sausage. Bologna and other processed lunch meats. Salami. Fatback. Hotdogs. Bratwurst. Salted nuts and seeds. Canned beans with added salt. Canned or smoked fish. Whole eggs or egg yolks. Chicken or [place] with skin. Dairy Whole or 2% milk, cream, and half-and-half. Whole or full-fat cream cheese. Whole-fat or sweetened yogurt. Full-fat cheese. Nondairy creamers. Whipped toppings. Processed cheese and cheese spreads. Fats and oils Butter. Stick margarine. Lard. Shortening. Ghee. Bacon fat. Tropical oils, such as coconut, palm kernel, or palm oil. Seasoning and other foods Salted popcorn and pretzels. Onion salt, garlic salt, seasoned salt, table salt, and sea salt. Worcestershire sauce. Tartar sauce. Barbecue sauce. Teriyaki sauce. Soy sauce, including reduced-sodium. Steak sauce. Canned and packaged gravies. Fish sauce. Oyster sauce. Cocktail sauce. Horseradish that  you find on the shelf. Ketchup. Mustard. Meat flavorings and tenderizers. Bouillon cubes. Hot sauce and Tabasco sauce. Premade or packaged marinades. Premade or packaged taco seasonings. Relishes. Regular salad dressings. Where to find more information: National Heart, Lung, and [place]: https://wilson-eaton.com/ American Heart Association: www.heart.org Summary The DASH eating  plan is a healthy eating plan that has been shown to reduce high blood pressure (hypertension). It may also reduce your risk for type 2 diabetes, heart disease, and stroke. With the DASH eating plan, you should limit salt (sodium) intake to 2,300 mg a day. If you have hypertension, you may need to reduce your sodium intake to 1,500 mg a day. When on the DASH eating plan, aim to eat more fresh fruits and vegetables, whole grains, lean proteins, low-fat dairy, and heart-healthy fats. Work with your health care provider or diet and nutrition specialist (dietitian) to adjust your eating plan to your individual calorie needs. This information is not intended to replace advice given to you by your health care provider. Make sure you discuss any questions you have with your health care provider. Document Released: 03/09/2011 Document Revised: 03/02/2017 Document Reviewed: 03/13/2016 Elsevier Patient Education  2020 [employer].

## 2022-06-21 NOTE — Progress Notes (Signed)
Advanced Hypertension Clinic Initial Assessment:    Date:  06/21/2022   ID:  Dana Dunlap, DOB [birth year], MRN [id number]  PCP:  [facility]  Cardiologist:  None  Nephrologist:  Referring MD: Bruna Potter,*   CC: Hypertension  History of Present Illness:    Dana Dunlap is a 52 y.o. female with a hx of hypertension, hyperlipidemia, prediabetes, anemia, PTSD, and depression, here to establish care in the Advanced Hypertension Clinic. She was seen by Alinda Money, PA 05/02/2022 and her blood pressure was uncontrolled on amlodipine 5 mg. Her amlodipine was increased to 10 mg and she requested a referral to cardiology. On 10 mg atorvastatin for hyperlipidemia. She was also struggling with increased anxiety and depression but planned to resume seeing a therapist.  Today, she is feeling okay. Her main concern today is discussing the possibility of pursuing a coronary calcium score. This is because she has noticed numbness in her fingers and her parents have cardiovascular issues and similar numbness. A year ago, she had sudden acute numbness of her left 3rd finger which has persisted. She has been told it was carpal tunnel. Only recently her numbness feels like it may be subsiding gradually. In her family, her father had a stent placed when he was about 54 yo; he also wore a heart monitor due to frequent syncope. Her mother had a stroke at 55 yo. Both of her parents have the sickle cell trait. Additionally she states that she is an alpha thalassemia silent carrier. Mostly she would attribute her hypertension to constantly struggling with a lot of stress. She reports that her blood pressure used to be in the 140s-150s. After increasing to 10 mg amlodipine she has noticed home readings of [id number] systolic. Today in clinic her blood pressure was initially 126/83 (138/70 on recheck). She is not certain if she has white coat syndrome. Of note, she does endorse a history of anemia. In the  past she had a lot of blood transfusions; her lowest hemoglobin was 3.0. It was felt her anemia was secondary to heavy menses. She used to hear her heartbeat in her left ear loudly while very anemic. Recently she suffered a fall and her right hand was swollen for 2 weeks. Currently she is wearing a right hand brace. She admits to needing to restart her exercise. She enjoys walking, and will use the treadmill at the gym. Recently she underwent a left total knee arthroplasty which has changed how it feels while walking. Overall when she does exercise she feels better. Previously she ordered out frequently. Lately she stopped and is now cooking her meals at home. Typically she drinks 1 caffeinated beverage in a day. Alcohol consumption is limited to 1 glass of wine occasionally. No smoking history or drug use. She denies any palpitations, chest pain, shortness of breath, lightheadedness, headaches, syncope, orthopnea, or PND.  Previous antihypertensives:   Past Medical History:  Diagnosis Date   Anemia    Depression    Currently on Cymbalta   Eye abnormalities    Fibroids    Frequent headaches    Worse during monthly menstrual cycle   Hypertension     Past Surgical History:  Procedure Laterality Date   BREAST MASS EXCISION     benign   KNEE SURGERY      Current Medications: Current Meds  Medication Sig   acetaminophen (TYLENOL) 325 MG tablet Take 650 mg by mouth every 6 (six) hours as needed. Patient taking twice weekly  amLODipine (NORVASC) 10 MG tablet Take 1 tablet (10 mg total) by mouth daily.   atorvastatin (LIPITOR) 10 MG tablet Take 1 tablet (10 mg total) by mouth daily.   Blood Pressure Monitoring (BLOOD PRESSURE KIT) DEVI 1 kit by Does not apply route daily.   DULoxetine (CYMBALTA) 30 MG capsule Take 90 mg by mouth daily.   hydrOXYzine (ATARAX) 25 MG tablet Take 1 tablet (25 mg total) by mouth 2 (two) times daily as needed.   meloxicam (MOBIC) 15 MG tablet Take 1 tablet by  mouth once daily. Do not take with other oral NSAIDs.   nystatin-triamcinolone ointment (MYCOLOG) Apply topically Two (2) times a day.     Allergies:   Prochlorperazine, Tramadol, Compazine [prochlorperazine edisylate], and Other   Social History   Socioeconomic History   Marital status: Single    Spouse name: Not on file   Number of children: 0   Years of education: 16   Highest education level: Bachelor's degree (e.g., BA, AB, BS)  Occupational History   Occupation: Psychologist, clinical: [employer]    Comment: Molecular biology  Tobacco Use   Smoking status: Never   Smokeless tobacco: Never  Vaping Use   Vaping Use: Never used  Substance and Sexual Activity   Alcohol use: Yes    Alcohol/week: 1.0 standard drink of alcohol    Types: 1 Glasses of wine per week   Drug use: No   Sexual activity: Yes    Birth control/protection: Condom  Other Topics Concern   Not on file  Social History Narrative   Dana Dunlap is originally from [place]. She attended [employer] in [place] where she obtained her Therapist, nutritional in Biology in 1995. 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 She is in a long term relationship with her boyfried, [name]. They have been together for 6 years. Dana Dunlap transferred to [employer] [facility] [employer] with [place] in January. She enjoys reading and she enjoys the outdoors. She loves to travel. 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   Social Determinants of Health   Financial Resource Strain: Low Risk  (05/28/2017)   Overall Financial Resource Strain (CARDIA)    Difficulty of Paying Living Expenses: Not hard at all  Food Insecurity: No Food Insecurity (06/21/2022)   Hunger Vital Sign    Worried About Running Out of Food in the Last Year: Never true    Ran Out of Food in the Last Year: Never true  Transportation Needs: No Transportation Needs (06/21/2022)   PRAPARE - Armed forces logistics/support/administrative officer (Medical): No    Lack of Transportation (Non-Medical): No  Physical Activity: Insufficiently Active (06/21/2022)   Exercise Vital Sign    Days of Exercise per Week: 1 day    Minutes of Exercise per Session: 30 min  Stress: Stress Concern Present (05/28/2017)   [place]    Feeling of Stress : Very much  Social Connections: Socially Isolated (05/28/2017)   Social Connection and Isolation Panel [NHANES]    Frequency of Communication with Friends and Family: Once a week    Frequency of Social Gatherings with Friends and Family: Never    Attends Religious Services: Never    Marine scientist or Organizations: No    Attends Archivist Meetings: Never    Marital Status: Never married  Family History: The patient's family history includes Cancer in her father; Coronary artery disease in her father; Diabetes in her father and paternal aunt; Fibroids in her sister and sister; Hyperlipidemia in her father and mother; Hypertension in her father and mother; Sickle cell trait in her brother, sister, and another family member; Stroke in her mother and paternal aunt; Thalassemia in an other family member.  ROS:   Please see the history of present illness.    (+) Numbness of left 3rd finger (+) Recent fall All other systems reviewed and are negative.  EKGs/Labs/Other Studies Reviewed:    No prior cardiovascular studies available.  EKG:  EKG is personally reviewed. 06/21/2022: EKG was not ordered.  Recent Labs: 08/01/2021: ALT 15; BUN 8; Creatinine, Ser 0.99; Potassium 3.8; Sodium 140 08/15/2021: Hemoglobin 13.1; Platelets 274   Recent Lipid Panel    Component Value Date/Time   CHOL 216 (H) 01/26/2021 0950   TRIG 50 01/26/2021 0950   HDL 74 01/26/2021 0950   CHOLHDL 2.9 01/26/2021 0950   CHOLHDL 3 03/21/2016 1621   VLDL 10.8 03/21/2016 1621   LDLCALC 133 (H) 01/26/2021 0950     Physical Exam:    VS:  BP 138/70 (BP Location: Right Arm, Patient Position: Sitting, Cuff Size: Large)   Pulse 61   Ht 5' (1.524 m)   Wt 180 lb (81.6 kg)   BMI 35.15 kg/m  , BMI Body mass index is 35.15 kg/m. GENERAL:  Well appearing HEENT: Pupils equal round and reactive, fundi not visualized, oral mucosa unremarkable NECK:  No jugular venous distention, waveform within normal limits, carotid upstroke brisk and symmetric, no bruits, no thyromegaly LYMPHATICS:  No cervical adenopath HEART:  RRR.  PMI not displaced or sustained,S1 and S2 within normal limits, no S3, no S4, no clicks, no rubs, 2/6 systolic murmur at the LUSB. ABD:  Flat, positive bowel sounds normal in frequency in pitch, no bruits, no rebound, no guarding, no midline pulsatile mass, no hepatomegaly, no splenomegaly EXT:  2 plus pulses throughout, no edema, no cyanosis, no clubbing SKIN:  No rashes, no nodules NEURO:  Cranial nerves II through XII grossly intact, motor grossly intact throughout PSYCH:  Cognitively intact, oriented to person place and time   ASSESSMENT/PLAN:    # CV Risk assessment:  Both parents with ASCVD, but not premature.  She is on appropriate therapy with atorvastatin.  Check Lp(a).  She is also interested in a coronary calcium score.    # Heart Murmur: - Plan:   a. Order an echocardiogram to further investigate the murmur.  # Hypertension: - Blood pressure recorded during the visit was 138/78, with the patient already engaged in home blood pressure monitoring.  Continue amlodipine.  Track BP bid and bring to follow up.  Continue exercise.  At least 150 minutes weekly.  Screening for Secondary Hypertension:     Relevant Labs/Studies:    Latest Ref Rng & Units 08/01/2021    3:27 PM 03/28/2020    8:38 PM 03/24/2020   11:14 AM  Basic Labs  Sodium 135 - 145 mmol/L 140  137  137   Potassium 3.5 - 5.1 mmol/L 3.8  3.8  3.8   Creatinine 0.44 - 1.00 mg/dL 1.61  0.96  0.45        Latest  Ref Rng & Units 12/10/2018    4:40 PM 06/11/2017   10:55 AM  Thyroid   TSH 0.450 - 4.500 uIU/mL 1.600  3.40  Disposition:    FU with APP in 1-2 months.  Medication Adjustments/Labs and Tests Ordered: Current medicines are reviewed at length with the patient today.  Concerns regarding medicines are outlined above.   Orders Placed This Encounter  Procedures   CT CARDIAC SCORING (SELF PAY ONLY)   Lipoprotein A (LPA)   ECHOCARDIOGRAM COMPLETE   No orders of the defined types were placed in this encounter.  I,Mathew Stumpf,acting as a Neurosurgeon for Chilton Si, MD.,have documented all relevant documentation on the behalf of Chilton Si, MD,as directed by  Chilton Si, MD while in the presence of Chilton Si, MD.  I, Tashari Schoenfelder C. Duke Salvia, MD have reviewed all documentation for this visit.  The documentation of the exam, diagnosis, procedures, and orders on 08/13/2022 are all accurate and complete.   Signed, Chilton Si, MD  06/21/2022 4:28 PM    [facility]

## 2022-07-11 ENCOUNTER — Ambulatory Visit (HOSPITAL_BASED_OUTPATIENT_CLINIC_OR_DEPARTMENT_OTHER): Payer: Medicaid Other | Admitting: Cardiovascular Disease

## 2022-07-20 ENCOUNTER — Telehealth (HOSPITAL_BASED_OUTPATIENT_CLINIC_OR_DEPARTMENT_OTHER): Payer: Self-pay | Admitting: Cardiovascular Disease

## 2022-07-20 ENCOUNTER — Other Ambulatory Visit (HOSPITAL_BASED_OUTPATIENT_CLINIC_OR_DEPARTMENT_OTHER): Payer: Medicaid Other

## 2022-07-20 NOTE — Telephone Encounter (Signed)
Called to discuss rescheduling the Echocardiogram appointment that was missed on 07/20/22---no answer and voice mail is full

## 2022-07-26 ENCOUNTER — Ambulatory Visit (HOSPITAL_BASED_OUTPATIENT_CLINIC_OR_DEPARTMENT_OTHER)
Admission: RE | Admit: 2022-07-26 | Discharge: 2022-07-26 | Disposition: A | Discharge status: Home / Self Care | Payer: Medicaid Other | Source: Ambulatory Visit | Attending: Cardiovascular Disease | Admitting: Cardiovascular Disease

## 2022-07-26 ENCOUNTER — Encounter (HOSPITAL_BASED_OUTPATIENT_CLINIC_OR_DEPARTMENT_OTHER): Payer: Self-pay | Admitting: Cardiovascular Disease

## 2022-07-26 DIAGNOSIS — E7849 Other hyperlipidemia: Secondary | ICD-10-CM | POA: Insufficient documentation

## 2022-07-26 DIAGNOSIS — I10 Essential (primary) hypertension: Secondary | ICD-10-CM | POA: Insufficient documentation

## 2022-07-26 NOTE — Telephone Encounter (Signed)
Called to discuss rescheduling the 07/20/22 missed echocardiogram appointment.  No answer and voice mail is full---will mail letter requesting patient call

## 2022-07-28 LAB — LIPOPROTEIN A (LPA): Lipoprotein (a): 133.5 nmol/L — ABNORMAL HIGH (ref <75.0)

## 2022-08-01 ENCOUNTER — Ambulatory Visit (HOSPITAL_BASED_OUTPATIENT_CLINIC_OR_DEPARTMENT_OTHER): Payer: Medicaid Other | Admitting: Cardiovascular Disease

## 2022-08-03 ENCOUNTER — Ambulatory Visit (HOSPITAL_BASED_OUTPATIENT_CLINIC_OR_DEPARTMENT_OTHER): Payer: Medicaid Other | Admitting: Family

## 2022-08-13 ENCOUNTER — Encounter (HOSPITAL_BASED_OUTPATIENT_CLINIC_OR_DEPARTMENT_OTHER): Payer: Self-pay | Admitting: Cardiovascular Disease

## 2022-08-16 ENCOUNTER — Encounter: Payer: Self-pay | Admitting: Oncology

## 2022-08-21 ENCOUNTER — Other Ambulatory Visit: Payer: Self-pay

## 2022-08-23 ENCOUNTER — Other Ambulatory Visit (HOSPITAL_BASED_OUTPATIENT_CLINIC_OR_DEPARTMENT_OTHER): Payer: Medicaid Other

## 2022-08-29 ENCOUNTER — Other Ambulatory Visit (HOSPITAL_BASED_OUTPATIENT_CLINIC_OR_DEPARTMENT_OTHER): Payer: Self-pay

## 2022-08-30 ENCOUNTER — Other Ambulatory Visit (HOSPITAL_BASED_OUTPATIENT_CLINIC_OR_DEPARTMENT_OTHER): Payer: Self-pay

## 2022-08-31 ENCOUNTER — Telehealth (HOSPITAL_BASED_OUTPATIENT_CLINIC_OR_DEPARTMENT_OTHER): Payer: Self-pay | Admitting: Licensed Clinical Social Worker

## 2022-08-31 ENCOUNTER — Other Ambulatory Visit (HOSPITAL_BASED_OUTPATIENT_CLINIC_OR_DEPARTMENT_OTHER): Payer: Self-pay

## 2022-08-31 ENCOUNTER — Encounter (HOSPITAL_BASED_OUTPATIENT_CLINIC_OR_DEPARTMENT_OTHER): Payer: Self-pay | Admitting: Family

## 2022-08-31 ENCOUNTER — Ambulatory Visit (INDEPENDENT_AMBULATORY_CARE_PROVIDER_SITE_OTHER): Payer: Medicaid Other | Admitting: Family

## 2022-08-31 VITALS — BP 144/88 | HR 67 | Ht 60.0 in | Wt 184.0 lb

## 2022-08-31 DIAGNOSIS — F419 Anxiety disorder, unspecified: Secondary | ICD-10-CM | POA: Diagnosis not present

## 2022-08-31 DIAGNOSIS — R011 Cardiac murmur, unspecified: Secondary | ICD-10-CM

## 2022-08-31 DIAGNOSIS — I10 Essential (primary) hypertension: Secondary | ICD-10-CM | POA: Diagnosis not present

## 2022-08-31 DIAGNOSIS — F439 Reaction to severe stress, unspecified: Secondary | ICD-10-CM

## 2022-08-31 MED ORDER — DULOXETINE HCL 30 MG PO CPEP
30.0000 mg | ORAL_CAPSULE | Freq: Three times a day (TID) | ORAL | 0 refills | Status: DC
  Filled 2022-08-31: qty 180, 60d supply, fill #0

## 2022-08-31 MED ORDER — AMLODIPINE-OLMESARTAN 10-20 MG PO TABS
1.0000 | ORAL_TABLET | Freq: Every day | ORAL | 5 refills | Status: DC
  Filled 2022-08-31: qty 30, 30d supply, fill #0
  Filled 2022-10-17: qty 30, 30d supply, fill #1

## 2022-08-31 NOTE — Patient Instructions (Signed)
Medication Instructions:  Your physician has recommended you make the following change in your medication:   STOP Amlodipine  START Amlodipine-Olmesartan 10-20mg  once per day    Testing/Procedures: Your physician recommends that you return for lab work in 1 week for BMP  Please return for Lab work. You may come to the...   [facility] (3rd floor) [address], [place], [place] [address]  Open: 8am-Noon and 1pm-4:30pm  Please ring the doorbell on the small table when you exit the elevator and the Lab Tech will come get you  [facility] at [facility] [address], [place], [place] [address] Open: 8am-1pm, then 2pm-4:30pm   [employer] [facility]- Please see attached locations sheet stapled to your lab work with address and hours.    Your physician has requested that you have an echocardiogram. Echocardiography is a painless test that uses sound waves to create images of your heart. It provides your doctor with information about the size and shape of your heart and how well your heart's chambers and valves are working. This procedure takes approximately one hour. There are no restrictions for this procedure. Please do NOT wear cologne, perfume, aftershave, or lotions (deodorant is allowed). Please arrive 15 minutes prior to your appointment time.   Follow-Up: In 1 month with Dr. Duke Salvia or Phillips Hay, [facility] or Alver Sorrow, NP   Special Instructions:   [facility] [phone] [address] [place], [place] [address] Open 24/7, No appointment required.  Primary care: Dr. Celene Squ[facility]b [facility] [address] [place] [address] [place], [place] [address] [phone]

## 2022-08-31 NOTE — Progress Notes (Signed)
Heart and Vascular Care Navigation  08/31/2022  Dana Dunlap Aug 10, 1970 [id number]  Reason for Referral: SDOH Screening/Stress Patient is participating in a Managed Medicaid Plan:No, Medicaid [place] Access  Engaged with patient by telephone for initial visit for Heart and Vascular Care Coordination.                                                                                                   Assessment:                                     LCSW reached out to pt via telephone today to address referral for financial challenges/stress. LCSW was unable to reach pt at first as voicemail at [phone] is full. Pt immediately called back. I introduced self, role, reason for call. Pt had been encouraged to go to [facility] after visit due to stressors. When I reached her she had not gone to [facility] but had gone to [facility] for support and resource counseling. She was given some information. Pt confirmed new home address in [place] and this has been added to her chart. She shares multiple stressors including loss of job, loss of housing, and challenges with her knee and BP. She was evicted from last housing in [place] and her partner moved back to [place], she currently resides in student housing in [place] and has a roommate who has animals and the apartment has pests. Pt tearful/sobbing throughout the conversation. She feels that the housing management charged her more for her housing due to her complaints about the unit. She states throughout the conversation that she feels racism is playing a large part in the way people are treating her. She is using savings to pay for her housing.   Pt care mostly through [facility], has been given resources for local providers since she is now in [facility], prefers [place] female providers due to past experiences/distrust of medical system.   Pt previously has had counseling through [facility] and [facility]. She worries about some mental health  supports, stopped going to [facility] as they dx her with paranoia and she feels they did not provide adequate support during the time of her eviction- she still feels she is not sure why she was evicted. Pt had seen provider at [facility] of the [place] and they unfortunately left practice for counseling. She isn't sure/doesn't think [facility] reached out to schedule with new provider. She also has completed intake at [facility]. She isnt sure they have reached out. I requested pt take BP medications as prescribed today, ensured she had them from pharmacy. Checked for safety and requested she go to [facility] if still overwhelmed. I will send emails to intake at [facility] and [facility] to see if they can reach out with any additional resources. I will send her information about rent/utility assistance and Legal Aid. Pt agreeable to this plan.   HRT/VAS Care Coordination     Patients Home Cardiology Office --  [facility]   Outpatient Care Team  Social Worker   Social Worker Name: Octavio Graves, [place], [phone]   Living arrangements for the past 2 months Apartment   Lives with: Roommate   Patient Current Insurance Coverage Medicaid   Patient Has Concern With Paying Medical Bills No   Does Patient Have Prescription Coverage? Yes   Home Assistive Devices/Equipment Cane (specify quad or straight)       Social History:                                                                             SDOH Screenings   Food Insecurity: No Food Insecurity (08/31/2022)  Housing: High Risk (08/31/2022)  Transportation Needs: No Transportation Needs (08/31/2022)  Utilities: Not At Risk (08/31/2022)  Alcohol Screen: Low Risk  (06/21/2022)  Depression (PHQ2-9): Medium Risk (07/27/2020)  Financial Resource Strain: High Risk (08/31/2022)  Physical Activity: Insufficiently Active (06/21/2022)  Social Connections: Socially Isolated (05/28/2017)  Stress: Stress Concern Present (08/31/2022)  Tobacco Use: Low Risk  (08/31/2022)    SDOH  Interventions: Financial Resources:  Financial Strain Interventions: Other (Comment) (will mail resources for legal aid, code compliance, urban ministries)  Food Insecurity:  Food Insecurity Interventions: Intervention Not Indicated  Housing Insecurity:  Housing Insecurity Interventions: Other (Comment) (will mail resources for legal aid, code compliance, urban ministries)  Transportation:   Transportation Interventions: Intervention Not Indicated    Other Care Navigation Interventions:     Provided Pharmacy assistance resources  Pt was able to afford and access her medications today, encouraged her to take the medications and discussed during her appt.  Patient expressed Mental Health concerns Yes, Referred to:  f/u sent to [facility] and [facility] the [place]. I will mail additional resources, recommended going to [facility] for walk in support/additional resources   Follow-up plan:   LCSW will mail pt information about additional mental health resources, I will send information about Code Compliance due to housing concerns, Legal Aid and [employer] to support with housing concerns and financial assistance. I sent f/u to [facility] and [facility] [place] as pt has completed referral/previously had services at those agencies respectively, pt plan is to go home and take her medications and rest- she verbally agreed to seek support at [facility] tomorrow and was reminded that it is open 24/7 should she need crisis support before that time.

## 2022-08-31 NOTE — Progress Notes (Signed)
Advanced Hypertension Clinic Assessment:    Date:  09/01/2022   ID:  Dana Dunlap, DOB 24-Dec-1970, MRN [id number]  [facility]P:  [facility]  Cardiologist:  None  Nephrologist:  Referring MD: [facility]   CC: Hypertension  History of Present Illness:    Dana Dunlap is a 52 y.o. female with a hx of HTN, HLD, prediabetes, anemia, PTSD, depression here to follow-up in the Advanced Hypertension Clinic.   Primary care previously increased Amlodipine from 5mg  to 10mg .   Established with advanced hypertension clinic 06/21/2022.  She was understandably concerned about cardiovascular risk as both parents ASCVD but not premature.  Murmur noted on exam.  Blood pressure was mildly elevated and she is recommended to monitor at home and increase physical activity and continue present dose amlodipine. Echocardiogram ordered but not yet performed.  Coronary calcium score 07/26/2022 of 0 with no evidence of heart disease.  Presents today for follow up. Notes persistent stress which she attributes to causing her high blood pressure. Since last seen evicted from her home in [place] and now living in [place]. Also notes losing a part time job and that her previous counselor has quit leaving her without mental health services. Also notes floaters and visual difficulties and is frustrated as does not feel her eye doctor is helping. Endorses some understandable mistrust of the medical system based on previous experiences. Blood pressure has been consistently above goal of 130/80 at home.   Previous antihypertensives:   Past Medical History:  Diagnosis Date   Anemia    Depression    Currently on Cymbalta   Eye abnormalities    Fibroids    Frequent headaches    Worse during monthly menstrual cycle   Hypertension     Past Surgical History:  Procedure Laterality Date   BREAST MASS EXCISION     benign   KNEE SURGERY      Current Medications: Current Meds  Medication Sig    acetaminophen (TYLENOL) 325 MG tablet Take 650 mg by mouth every 6 (six) hours as needed. Patient taking twice weekly   amlodipine-olmesartan (AZOR) 10-20 MG tablet Take 1 tablet by mouth daily.   atorvastatin (LIPITOR) 10 MG tablet Take 1 tablet (10 mg total) by mouth daily.   Blood Pressure Monitoring (BLOOD PRESSURE KIT) DEVI 1 kit by Does not apply route daily.   DULoxetine (CYMBALTA) 30 MG capsule Take 90 mg by mouth daily.   hydrOXYzine (ATARAX) 25 MG tablet Take 1 tablet (25 mg total) by mouth 2 (two) times daily as needed.   meloxicam (MOBIC) 15 MG tablet Take 1 tablet by mouth once daily. Do not take with other oral NSAIDs.   nystatin-triamcinolone ointment (MYCOLOG) Apply topically Two (2) times a day.   sodium chloride (OCEAN) 0.65 % SOLN nasal spray Place 1 spray into both nostrils as needed for congestion.   traZODone (DESYREL) 50 MG tablet Take 1 tablet (50 mg total) by mouth once nightly at bedtime as needed.   [DISCONTINUED] amLODipine (NORVASC) 10 MG tablet Take 1 tablet (10 mg total) by mouth daily.     Allergies:   Prochlorperazine, Tramadol, Compazine [prochlorperazine edisylate], and Other   Social History   Socioeconomic History   Marital status: Single    Spouse name: Not on file   Number of children: 0   Years of education: 16   Highest education level: Bachelor's degree (e.g., BA, AB, BS)  Occupational History   Occupation: Retail buyer: [employer]    Comment: Molecular biology  Tobacco Use   Smoking status: Never   Smokeless tobacco: Never  Vaping Use   Vaping Use: Never used  Substance and Sexual Activity   Alcohol use: Yes    Alcohol/week: 1.0 standard drink of alcohol    Types: 1 Glasses of wine per week   Drug use: No   Sexual activity: Yes    Birth control/protection: Condom  Other Topics Concern   Not on file  Social History Narrative   Dana Dunlap is originally from [place]. She attended [employer] in [place], [place]  where she obtained her Scientist, water quality in Biology in 1995. 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 She is in a long term relationship with her boyfried, [name]. They have been together for 6 years. [name] transferred to [facility] with [facility] in January. She enjoys reading and she enjoys the outdoors. She loves to travel. 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   Social Determinants of Health   Financial Resource Strain: High Risk (08/31/2022)   Overall Financial Resource Strain (CARDIA)    Difficulty of Paying Living Expenses: Hard  Food Insecurity: No Food Insecurity (08/31/2022)   Hunger Vital Sign    Worried About Running Out of Food in the Last Year: Never true    Ran Out of Food in the Last Year: Never true  Transportation Needs: No Transportation Needs (08/31/2022)   PRAPARE - Administrator, Civil Service (Medical): No    Lack of Transportation (Non-Medical): No  Physical Activity: Insufficiently Active (06/21/2022)   Exercise Vital Sign    Days of Exercise per Week: 1 day    Minutes of Exercise per Session: 30 min  Stress: Stress Concern Present (08/31/2022)   [employer] of Occupational Health - Occupational Stress Questionnaire    Feeling of Stress : Very much  Social Connections: Socially Isolated (05/28/2017)   Social Connection and Isolation Panel [NHANES]    Frequency of Communication with Friends and Family: Once a week    Frequency of Social Gatherings with Friends and Family: Never    Attends Religious Services: Never    Database administrator or Organizations: No    Attends Engineer, structural: Never    Marital Status: Never married     Family History: The patient's family history includes Cancer in her father; Coronary artery disease in her father; Diabetes in her father and paternal aunt; Fibroids in her sister and sister; Hyperlipidemia in her father and mother;  Hypertension in her father and mother; Sickle cell trait in her brother, sister, and another family member; Stroke in her mother and paternal aunt; Thalassemia in an other family member.  ROS:   Please see the history of present illness.    All other systems reviewed and are negative.  EKGs/Labs/Other Studies Reviewed:    EKG:  EKG is not ordered today.    Recent Labs: No results found for requested labs within last 365 days.   Recent Lipid Panel    Component Value Date/Time   CHOL 216 (H) 01/26/2021 0950   TRIG 50 01/26/2021 0950   HDL 74 01/26/2021 0950   CHOLHDL 2.9 01/26/2021 0950   CHOLHDL 3 03/21/2016 1621   VLDL 10.8 03/21/2016 1621   LDLCALC 133 (H) 01/26/2021 0950    Physical Exam:   VS:  BP (!) 144/88   Pulse 67  Ht 5' (1.524 m)   Wt 184 lb (83.5 kg)   BMI 35.94 kg/m  , BMI Body mass index is 35.94 kg/m. GENERAL:  Well appearing HEENT: Pupils equal round and reactive, fundi not visualized, oral mucosa unremarkable NECK:  No jugular venous distention, waveform within normal limits, carotid upstroke brisk and symmetric, no bruits, no thyromegaly LYMPHATICS:  No cervical adenopathy LUNGS:  Clear to auscultation bilaterally HEART:  RRR.  PMI not displaced or sustained,S1 and S2 within normal limits, no S3, no S4, no clicks, no rubs, gr 2/6 systolic murmur at LUSB ABD:  Flat, positive bowel sounds normal in frequency in pitch, no bruits, no rebound, no guarding, no midline pulsatile mass, no hepatomegaly, no splenomegaly EXT:  2 plus pulses throughout, no edema, no cyanosis no clubbing SKIN:  No rashes no nodules NEURO:  Cranial nerves II through XII grossly intact, motor grossly intact throughout PSYCH:  Cognitively intact, oriented to person place and time  ASSESSMENT/PLAN:    CV risk assessment -07/2022 for calcium score of 0.  No evidence of heart disease recommend primary prevention with diet and exercise.  Recommend aiming for 150 minutes of moderate  intensity activity per week and following a heart healthy diet.    Heart murmur - Echo previously ordered, will facilitate scheduling today.   HTN -initial BP 151/87 with repeat 144/88.  BP not at goal less than 130/80. Stop Amlodipine and start Amlodipine-Olmesartan 10-20mg  QD. BMP in 1 week. Discussed to monitor BP at home at least 2 hours after medications and sitting for 5-10 minutes.   Stress / Anxiety -contributory to hypertension.  Her prior counselor has left that practice.  Encouraged to establish with counselor at [facility] was tearful multiple times during his visit today strongly encouraged her to attend their urgent care walk-in hours today. Also planning to establish with primary care since moving to the area - provided information for local providers. Refer to SW for any possible assistance with mental health resources.   Screening for Secondary Hypertension:     Relevant Labs/Studies:    Latest Ref Rng & Units 08/01/2021    3:27 PM 03/28/2020    8:38 PM 03/24/2020   11:14 AM  Basic Labs  Sodium 135 - 145 mmol/L 140  137  137   Potassium 3.5 - 5.1 mmol/L 3.8  3.8  3.8   Creatinine 0.44 - 1.00 mg/dL 1.61  0.96  0.45        Latest Ref Rng & Units 12/10/2018    4:40 PM 06/11/2017   10:55 AM  Thyroid   TSH 0.450 - 4.500 uIU/mL 1.600  3.40                  Disposition:    FU with MD/PharmD in 1 month    Medication Adjustments/Labs and Tests Ordered: Current medicines are reviewed at length with the patient today.  Concerns regarding medicines are outlined above.  Orders Placed This Encounter  Procedures   Basic metabolic panel   Referral to HRT/VAS Care Navigation   ECHOCARDIOGRAM COMPLETE   Meds ordered this encounter  Medications   amlodipine-olmesartan (AZOR) 10-20 MG tablet    Sig: Take 1 tablet by mouth daily.    Dispense:  30 tablet    Refill:  5     Signed, Alver Sorrow, NP  09/01/2022 10:09 AM    [facility]

## 2022-08-31 NOTE — Telephone Encounter (Signed)
H&V Care Navigation CSW Progress Note  Clinical Social Worker  received call back from pt  to update me that [facility] had called her. She is "on the longest waitlist they have." Pt again extremely tearful, states it triggered her again. She is frustrated by going through the system for her housing and mental health needs and feeling like she is getting no where. We discussed difficulty of locating resources, and I provided support that she is doing what she can to try and receive support and I am sorry that it has been such a frustrating journey. Pt is considering going to [facility] in the morning, I also encouraged her to contact DSS to see if she could get a in network list for mental health providers with her Medicaid plan. She is concerned if she goes to [facility] that she will have similar experience to when she was in treatment at [facility]. She has been using prayer and reading her Bible to try and calm down, took medications and feels a bit better right now. I shared that I appreciated her updates and someone from our team will be in touch tomorrow. I clarified again when asked my role, hours, and how to access our assistance. No additional questions at this time.  Patient is participating in a Managed Medicaid Plan:  No, Medicaid [place] Access  SDOH Screenings   Food Insecurity: No Food Insecurity (08/31/2022)  Housing: High Risk (08/31/2022)  Transportation Needs: No Transportation Needs (08/31/2022)  Utilities: Not At Risk (08/31/2022)  Alcohol Screen: Low Risk  (06/21/2022)  Depression (PHQ2-9): Medium Risk (07/27/2020)  Financial Resource Strain: High Risk (08/31/2022)  Physical Activity: Insufficiently Active (06/21/2022)  Social Connections: Socially Isolated (05/28/2017)  Stress: Stress Concern Present (08/31/2022)  Tobacco Use: Low Risk  (08/31/2022)   Octavio Graves, MSW, LCSW Clinical Social Worker [facility] Heart/Vascular Care Navigation  [phone]- work cell phone  (preferred) [phone]- desk phone

## 2022-09-01 ENCOUNTER — Telehealth (HOSPITAL_COMMUNITY): Payer: Self-pay | Admitting: Licensed Clinical Social Worker

## 2022-09-01 ENCOUNTER — Encounter (HOSPITAL_BASED_OUTPATIENT_CLINIC_OR_DEPARTMENT_OTHER): Payer: Self-pay | Admitting: Family

## 2022-09-01 ENCOUNTER — Other Ambulatory Visit (HOSPITAL_BASED_OUTPATIENT_CLINIC_OR_DEPARTMENT_OTHER): Payer: Self-pay

## 2022-09-01 NOTE — Telephone Encounter (Signed)
H&V Care Navigation CSW Progress Note  Clinical Social Worker called pt to check in following calls with coworker yesterday.  Pt reports she did hear from [facility] and has an appt on 6/20th with them.  Still struggling with mental health today so plans to go to [facility] this afternoon.  Pt tearful on the phone reiterating details she had provided to coworker yesterday.  Housing remains a large stressor for her and she is wanting to move ASAP.  States she can afford around $600 a month- CSW explained this would likely mean a boarding house- she is agreeable to this option.  CSW messaged multiple boarding house owners to get information regarding availability.  CSW also discussed possible further support with social and mental health concerns through her TCM provider with Medicaid.  Online it is listed as [facility]- CSW called and left VM for them to discuss pt case- awaiting return call.   SDOH Screenings   Food Insecurity: No Food Insecurity (08/31/2022)  Housing: High Risk (08/31/2022)  Transportation Needs: No Transportation Needs (08/31/2022)  Utilities: Not At Risk (08/31/2022)  Alcohol Screen: Low Risk  (06/21/2022)  Depression (PHQ2-9): Medium Risk (07/27/2020)  Financial Resource Strain: High Risk (08/31/2022)  Physical Activity: Insufficiently Active (06/21/2022)  Social Connections: Socially Isolated (05/28/2017)  Stress: Stress Concern Present (08/31/2022)  Tobacco Use: Low Risk  (09/01/2022)   Burna Sis, LCSW Clinical Social Worker Advanced Heart Failure Clinic Desk#: [phone] Cell#: [phone]

## 2022-09-04 ENCOUNTER — Telehealth (HOSPITAL_COMMUNITY): Payer: Self-pay | Admitting: Licensed Clinical Social Worker

## 2022-09-04 ENCOUNTER — Other Ambulatory Visit (HOSPITAL_BASED_OUTPATIENT_CLINIC_OR_DEPARTMENT_OTHER): Payer: Self-pay

## 2022-09-04 NOTE — Telephone Encounter (Signed)
H&V Care Navigation CSW Progress Note  Clinical Social Worker received call from pt TCM case management company [employer]- they report that assigned case worker is Daryel Gerald [phone].  CSW able to speak with Ms. Lewis and update on pt situation.  She will plan to reach out to pt to assess for further services- will update CSW after she is able to complete assessment.   SDOH Screenings   Food Insecurity: No Food Insecurity (08/31/2022)  Housing: High Risk (08/31/2022)  Transportation Needs: No Transportation Needs (08/31/2022)  Utilities: Not At Risk (08/31/2022)  Alcohol Screen: Low Risk  (06/21/2022)  Depression (PHQ2-9): Medium Risk (07/27/2020)  Financial Resource Strain: High Risk (08/31/2022)  Physical Activity: Insufficiently Active (06/21/2022)  Social Connections: Socially Isolated (05/28/2017)  Stress: Stress Concern Present (08/31/2022)  Tobacco Use: Low Risk  (09/01/2022)    Burna Sis, LCSW Clinical Social Worker Advanced Heart Failure Clinic Desk#: [phone] Cell#: [phone]

## 2022-09-21 ENCOUNTER — Encounter: Payer: Self-pay | Admitting: Oncology

## 2022-09-26 ENCOUNTER — Encounter (HOSPITAL_BASED_OUTPATIENT_CLINIC_OR_DEPARTMENT_OTHER): Payer: Self-pay

## 2022-09-27 ENCOUNTER — Encounter (HOSPITAL_BASED_OUTPATIENT_CLINIC_OR_DEPARTMENT_OTHER): Payer: Self-pay

## 2022-09-27 ENCOUNTER — Ambulatory Visit (HOSPITAL_BASED_OUTPATIENT_CLINIC_OR_DEPARTMENT_OTHER): Payer: Medicaid Other | Admitting: Family

## 2022-10-02 ENCOUNTER — Encounter: Payer: Self-pay | Admitting: Oncology

## 2022-10-04 ENCOUNTER — Other Ambulatory Visit (HOSPITAL_BASED_OUTPATIENT_CLINIC_OR_DEPARTMENT_OTHER): Payer: Medicaid Other

## 2022-10-17 ENCOUNTER — Encounter: Payer: Self-pay | Admitting: Oncology

## 2022-10-17 ENCOUNTER — Other Ambulatory Visit (HOSPITAL_BASED_OUTPATIENT_CLINIC_OR_DEPARTMENT_OTHER): Payer: Self-pay

## 2022-10-17 ENCOUNTER — Telehealth (HOSPITAL_BASED_OUTPATIENT_CLINIC_OR_DEPARTMENT_OTHER): Payer: Self-pay | Admitting: Family

## 2022-10-17 DIAGNOSIS — I10 Essential (primary) hypertension: Secondary | ICD-10-CM

## 2022-10-17 MED ORDER — AMLODIPINE-OLMESARTAN 10-20 MG PO TABS
1.0000 | ORAL_TABLET | Freq: Every day | ORAL | 2 refills | Status: DC
Start: 2022-10-17 — End: 2023-11-06
  Filled 2022-10-17 – 2022-11-23 (×4): qty 90, 90d supply, fill #0
  Filled 2023-03-29 – 2023-05-14 (×2): qty 90, 90d supply, fill #1
  Filled 2023-07-30 – 2023-09-21 (×2): qty 90, 90d supply, fill #2

## 2022-10-17 NOTE — Telephone Encounter (Signed)
*  STAT* If patient is at the pharmacy, call can be transferred to refill team.   1. Which medications need to be refilled? (please list name of each medication and dose if known) Amlodipine-Olmesartan   2. Would you like to learn more about the convenience, safety, & potential cost savings by using the [facility]?    3. Are you open to using the [facility] (Type [facility].   4. Which pharmacy/location (including street and city if local pharmacy) is medication to be sent to?[facility]   5. Do they need a 30 day or 90 day supply?  90 days and refills- please call today- pt have been out of it

## 2022-10-17 NOTE — Telephone Encounter (Signed)
Rx(s) sent to pharmacy electronically.  

## 2022-10-27 ENCOUNTER — Other Ambulatory Visit: Payer: Self-pay

## 2022-10-28 ENCOUNTER — Other Ambulatory Visit: Payer: Self-pay

## 2022-10-29 ENCOUNTER — Other Ambulatory Visit: Payer: Self-pay

## 2022-10-30 ENCOUNTER — Other Ambulatory Visit: Payer: Self-pay

## 2022-10-31 ENCOUNTER — Other Ambulatory Visit: Payer: Self-pay

## 2022-11-01 ENCOUNTER — Encounter: Payer: Self-pay | Admitting: Oncology

## 2022-11-01 ENCOUNTER — Other Ambulatory Visit: Payer: Self-pay

## 2022-11-02 ENCOUNTER — Ambulatory Visit (INDEPENDENT_AMBULATORY_CARE_PROVIDER_SITE_OTHER): Payer: MEDICAID

## 2022-11-02 ENCOUNTER — Other Ambulatory Visit: Payer: Self-pay

## 2022-11-02 DIAGNOSIS — I10 Essential (primary) hypertension: Secondary | ICD-10-CM

## 2022-11-02 LAB — ECHOCARDIOGRAM COMPLETE
Area-P 1/2: 4.06 cm2
Calc EF: 57.7 %
S' Lateral: 2.49 cm
Single Plane A2C EF: 56.8 %
Single Plane A4C EF: 59.6 %

## 2022-11-03 ENCOUNTER — Other Ambulatory Visit: Payer: Self-pay

## 2022-11-04 ENCOUNTER — Other Ambulatory Visit: Payer: Self-pay

## 2022-11-05 ENCOUNTER — Other Ambulatory Visit: Payer: Self-pay

## 2022-11-06 ENCOUNTER — Other Ambulatory Visit: Payer: Self-pay

## 2022-11-07 ENCOUNTER — Other Ambulatory Visit: Payer: Self-pay

## 2022-11-08 ENCOUNTER — Ambulatory Visit: Payer: MEDICAID

## 2022-11-08 NOTE — Progress Notes (Deleted)
Office Visit    Patient Name: Dana Dunlap Date of Encounter: 11/08/2022  [facility] Provider:  [facility] Primary Cardiologist:  None  Chief Complaint    Hypertension - Advanced hypertension clinic  Past Medical History   HLD 2/24 LDL 144, Lp(a) 133.5  anemia   PTSD   depression On duloxetine 90, trazodone 50     Allergies  Allergen Reactions   Prochlorperazine Anaphylaxis and Other (See Comments)    Seizure Seizure Other reaction(s): Other (See Comments), Other (See Comments), Other (See Comments) Seizure Seizure Seizure Seizure AKA Prozac  Other reaction(s):  Seizure Seizure   Tramadol Hives   Compazine [Prochlorperazine Edisylate] Other (See Comments)    Seizure    Other     Sneezing and red eyes    History of Present Illness    Dana Dunlap is a 52 y.o. female patient who was referred to the Advanced Hypertension Clinic by Dr. Irine Seal.  At her first visit with Dr. Duke Salvia, pressure was slightly elevated at 138/78 on amlodipine.  She was asked to monitor at home and on follow up with Gillian Shields in May was elevated at 144/88.  Olmesartan 20 mg was added to her amlodipine 10 mg  Blood Pressure Goal:  130/80  Current Medications:   Adherence Assessment  Do you ever forget to take your medication? [] Yes [] No  Do you ever skip doses due to side effects? [] Yes [] No  Do you have trouble affording your medicines? [] Yes [] No  Are you ever unable to pick up your medication due to transportation difficulties? [] Yes [] No  Do you ever stop taking your medications because you don't believe they are helping? [] Yes [] No  Do you check your weight daily? [] Yes [] No   Adherence strategy: ***  Barriers to obtaining medications: ***  Previously tried:     Family Hx:     Social Hx:      Tobacco:  Alcohol:  Caffeine:    Diet:      Exercise:   Home BP readings:       Accessory Clinical Findings    Lab Results   Component Value Date   CREATININE 0.99 08/01/2021   BUN 8 08/01/2021   NA 140 08/01/2021   K 3.8 08/01/2021   CL 105 08/01/2021   CO2 27 08/01/2021   Lab Results  Component Value Date   ALT 15 08/01/2021   AST 21 08/01/2021   ALKPHOS 73 08/01/2021   BILITOT 0.4 08/01/2021   Lab Results  Component Value Date   HGBA1C 5.8 (H) 09/23/2020    Screening for Secondary Hypertension: { Click here to document screening for secondary causes of HTN  :1}     Relevant Labs/Studies:    Latest Ref Rng & Units 08/01/2021    3:27 PM 03/28/2020    8:38 PM 03/24/2020   11:14 AM  Basic Labs  Sodium 135 - 145 mmol/L 140  137  137   Potassium 3.5 - 5.1 mmol/L 3.8  3.8  3.8   Creatinine 0.44 - 1.00 mg/dL 1.61  0.96  0.45        Latest Ref Rng & Units 12/10/2018    4:40 PM 06/11/2017   10:55 AM  Thyroid   TSH 0.450 - 4.500 uIU/mL 1.600  3.40                   Home Medications    Current Outpatient Medications  Medication Sig Dispense Refill  acetaminophen (TYLENOL) 325 MG tablet Take 650 mg by mouth every 6 (six) hours as needed. Patient taking twice weekly     amlodipine-olmesartan (AZOR) 10-20 MG tablet Take 1 tablet by mouth daily. 90 tablet 2   atorvastatin (LIPITOR) 10 MG tablet Take 1 tablet (10 mg total) by mouth daily. 90 tablet 0   Blood Pressure Monitoring (BLOOD PRESSURE KIT) DEVI 1 kit by Does not apply route daily. 1 each 0   DULoxetine (CYMBALTA) 30 MG capsule Take 90 mg by mouth daily.     fluticasone (FLONASE) 50 MCG/ACT nasal spray PLACE 1 SPRAY INTO BOTH NOSTRILS ONCE DAILY 16 g 2   hydrOXYzine (ATARAX) 25 MG tablet Take 1 tablet (25 mg total) by mouth 2 (two) times daily as needed. 60 tablet 3   meloxicam (MOBIC) 15 MG tablet Take 1 tablet by mouth once daily. Do not take with other oral NSAIDs. 30 tablet 0   nystatin-triamcinolone ointment (MYCOLOG) Apply topically Two (2) times a day. 30 g 3   sodium chloride (OCEAN) 0.65 % SOLN nasal spray Place 1 spray into  both nostrils as needed for congestion. 30 mL 0   traZODone (DESYREL) 50 MG tablet Take 1 tablet (50 mg total) by mouth once nightly at bedtime as needed. 30 tablet 1   traZODone (DESYREL) 50 MG tablet Take 1 tablet (50 mg total) by mouth at bedtime. 90 tablet 1   No current facility-administered medications for this visit.     Assessment & Plan   No BP recorded.  {Refresh Note OR Click here to enter BP  :1}***   No problem-specific Assessment & Plan notes found for this encounter.   Phillips Hay PharmD CPP [facility]  [address] [place], [place] [address] [phone]

## 2022-11-09 ENCOUNTER — Other Ambulatory Visit (HOSPITAL_BASED_OUTPATIENT_CLINIC_OR_DEPARTMENT_OTHER): Payer: Self-pay

## 2022-11-09 ENCOUNTER — Other Ambulatory Visit: Payer: Self-pay

## 2022-11-09 ENCOUNTER — Encounter (HOSPITAL_BASED_OUTPATIENT_CLINIC_OR_DEPARTMENT_OTHER): Payer: Self-pay

## 2022-11-09 ENCOUNTER — Ambulatory Visit (INDEPENDENT_AMBULATORY_CARE_PROVIDER_SITE_OTHER): Payer: MEDICAID | Admitting: Cardiovascular Disease

## 2022-11-09 ENCOUNTER — Encounter (HOSPITAL_BASED_OUTPATIENT_CLINIC_OR_DEPARTMENT_OTHER): Payer: Self-pay | Admitting: Cardiovascular Disease

## 2022-11-09 VITALS — BP 144/90 | HR 66 | Ht 60.0 in | Wt 179.4 lb

## 2022-11-09 DIAGNOSIS — E785 Hyperlipidemia, unspecified: Secondary | ICD-10-CM

## 2022-11-09 DIAGNOSIS — F419 Anxiety disorder, unspecified: Secondary | ICD-10-CM | POA: Diagnosis not present

## 2022-11-09 DIAGNOSIS — Z006 Encounter for examination for normal comparison and control in clinical research program: Secondary | ICD-10-CM

## 2022-11-09 DIAGNOSIS — I10 Essential (primary) hypertension: Secondary | ICD-10-CM | POA: Diagnosis not present

## 2022-11-09 DIAGNOSIS — F32A Depression, unspecified: Secondary | ICD-10-CM

## 2022-11-09 DIAGNOSIS — H43393 Other vitreous opacities, bilateral: Secondary | ICD-10-CM | POA: Diagnosis not present

## 2022-11-09 MED ORDER — HYDROCHLOROTHIAZIDE 25 MG PO TABS
25.0000 mg | ORAL_TABLET | Freq: Every day | ORAL | 1 refills | Status: DC
  Filled 2022-11-09 (×2): qty 90, 90d supply, fill #0

## 2022-11-09 NOTE — Progress Notes (Signed)
Advanced Hypertension Clinic Follow-up:    Date:  11/09/2022   ID:  Dana Dunlap, DOB 06-28-1970, MRN [id number]  PCP:  [facility]  Cardiologist:  None  Nephrologist:  Referring MD: [facility]   CC: Hypertension  History of Present Illness:    Dana Dunlap is a 52 y.o. female with a hx of hypertension, hyperlipidemia, prediabetes, anemia, PTSD, depression, alpha thalassemia silent carrier, here for follow-up. She was initially seen 06/21/2022 in the [facility] Clinic. She was seen by Demetrios Loll, PA 05/02/2022 and her blood pressure was uncontrolled on amlodipine 5 mg. Her amlodipine was increased to 10 mg and she requested a referral to cardiology. On 10 mg atorvastatin for hyperlipidemia. She was also struggling with increased anxiety and depression but planned to resume seeing a therapist.  At her initial visit, she reported home blood pressures previously in the 140s-150s, but improved to 130s systolic after increasing amlodipine to 10 mg daily. Given a family history of ASCVD in both parents, she was interested in calcium scoring. Coronary CT 07/2022 revealed coronary calcium score of 0 without evidence of heart disease. LP(a) 07/2022 was elevated to 133.5. She followed up with Gillian Shields, NP 08/2022 and continued to struggle with persistent stress which she felt was contributory to her hypertension. Home readings were consistently above goal. Amlodipine was stopped and switched to amlodipine-olmesartan 10-20 mg daily. She had an echo 11/2022 showing LVEF 60-65% with normal diastolic parameters, degenerative mitral valve without regurgitation.  Today, she reports feeling very stressed lately. She notes that her blood pressure is high on some days which she attributes to her stress. In the office her blood pressure is 148/95 initially, and 144/90 on manual recheck. At home she reports a range of readings from 130s-160s. She doesn't complete much formal exercise  at this time but does plan to work on increasing this with neighborhood walks. She complains of some shortness of breath that she believes is due to her weight. She also struggles with right knee pain. Currently on Cymbalta. She has been feeling depressed regarding her weight and other health issues including her eyesight. She has blurry vision with floaters throughout that obstruct her vision. Her weight today is 184 lbs which she notes is her highest adult weight (previously 145 lbs prior to her surgery). She denies any palpitations, chest pain, peripheral edema, lightheadedness, headaches, syncope, orthopnea, or PND.  Previous antihypertensives:   Past Medical History:  Diagnosis Date   Anemia    Depression    Currently on Cymbalta   Eye abnormalities    Fibroids    Frequent headaches    Worse during monthly menstrual cycle   Hypertension     Past Surgical History:  Procedure Laterality Date   BREAST MASS EXCISION     benign   KNEE SURGERY      Current Medications: Current Meds  Medication Sig   acetaminophen (TYLENOL) 325 MG tablet Take 650 mg by mouth every 6 (six) hours as needed. Patient taking twice weekly   amlodipine-olmesartan (AZOR) 10-20 MG tablet Take 1 tablet by mouth daily.   atorvastatin (LIPITOR) 10 MG tablet Take 1 tablet (10 mg total) by mouth daily.   Blood Pressure Monitoring (BLOOD PRESSURE KIT) DEVI 1 kit by Does not apply route daily.   DULoxetine (CYMBALTA) 30 MG capsule Take 90 mg by mouth daily.   fluticasone (FLONASE) 50 MCG/ACT nasal spray PLACE 1 SPRAY INTO BOTH NOSTRILS ONCE DAILY   hydrochlorothiazide (HYDRODIURIL) 25 MG  tablet Take 1 tablet (25 mg total) by mouth daily.   hydrOXYzine (ATARAX) 25 MG tablet Take 1 tablet (25 mg total) by mouth 2 (two) times daily as needed.   meloxicam (MOBIC) 15 MG tablet Take 1 tablet by mouth once daily. Do not take with other oral NSAIDs.   nystatin-triamcinolone ointment (MYCOLOG) Apply topically Two (2) times  a day.   sodium chloride (OCEAN) 0.65 % SOLN nasal spray Place 1 spray into both nostrils as needed for congestion.   traZODone (DESYREL) 50 MG tablet Take 1 tablet (50 mg total) by mouth once nightly at bedtime as needed.   traZODone (DESYREL) 50 MG tablet Take 1 tablet (50 mg total) by mouth at bedtime.     Allergies:   Prochlorperazine, Tramadol, Compazine [prochlorperazine edisylate], and Other   Social History   Socioeconomic History   Marital status: Single    Spouse name: Not on file   Number of children: 0   Years of education: 16   Highest education level: Bachelor's degree (e.g., BA, AB, BS)  Occupational History   Occupation: Retail buyer: [employer]    Comment: Molecular biology  Tobacco Use   Smoking status: Never   Smokeless tobacco: Never  Vaping Use   Vaping status: Never Used  Substance and Sexual Activity   Alcohol use: Yes    Alcohol/week: 1.0 standard drink of alcohol    Types: 1 Glasses of wine per week   Drug use: No   Sexual activity: Yes    Birth control/protection: Condom  Other Topics Concern   Not on file  Social History Narrative   Ayverie is originally from [place]. She attended [employer] in [place], [place] where she obtained her Scientist, water quality in Biology in 1995. 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 She is in a long term relationship with her boyfried, [name]. They have been together for 6 years. [name] transferred to [facility] with [employer] in January. She enjoys reading and she enjoys the outdoors. She loves to travel. 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   Social Determinants of Health   Financial Resource Strain: High Risk (08/31/2022)   Overall Financial Resource Strain (CARDIA)    Difficulty of Paying Living Expenses: Hard  Food Insecurity: No Food Insecurity (08/31/2022)   Hunger Vital Sign    Worried About Running Out of Food in the  Last Year: Never true    Ran Out of Food in the Last Year: Never true  Transportation Needs: No Transportation Needs (08/31/2022)   PRAPARE - Administrator, Civil Service (Medical): No    Lack of Transportation (Non-Medical): No  Physical Activity: Insufficiently Active (06/21/2022)   Exercise Vital Sign    Days of Exercise per Week: 1 day    Minutes of Exercise per Session: 30 min  Stress: Stress Concern Present (08/31/2022)   [employer] of Occupational Health - Occupational Stress Questionnaire    Feeling of Stress : Very much  Social Connections: Socially Isolated (05/28/2017)   Social Connection and Isolation Panel [NHANES]    Frequency of Communication with Friends and Family: Once a week    Frequency of Social Gatherings with Friends and Family: Never    Attends Religious Services: Never    Database administrator or Organizations: No    Attends Banker Meetings: Never    Marital Status: Never  married     Family History: The patient's family history includes Cancer in her father; Coronary artery disease in her father; Diabetes in her father and paternal aunt; Fibroids in her sister and sister; Hyperlipidemia in her father and mother; Hypertension in her father and mother; Sickle cell trait in her brother, sister, and another family member; Stroke in her mother and paternal aunt; Thalassemia in an other family member.  ROS:   Please see the history of present illness.    (+) Stress (+) Shortness of breath (+) Right knee pain (+) Blurry vision, eye floaters All other systems reviewed and are negative.  EKGs/Labs/Other Studies Reviewed:    Echocardiogram  11/02/2022:  1. Left ventricular ejection fraction, by estimation, is 60 to 65%. The  left ventricle has normal function. The left ventricle has no regional  wall motion abnormalities. Left ventricular diastolic parameters were  normal. The average left ventricular  global longitudinal strain is  -20.0 %. The global longitudinal strain is  normal.   2. Right ventricular systolic function is normal. The right ventricular  size is normal.   3. The mitral valve is degenerative. No evidence of mitral valve  regurgitation.   4. The aortic valve is grossly normal. Aortic valve regurgitation is not  visualized.   5. The inferior vena cava is normal in size with greater than 50%  respiratory variability, suggesting right atrial pressure of 3 mmHg.   CT Cardiac Scoring  07/26/2022: IMPRESSION: Coronary calcium score of 0.   EKG:  EKG is personally reviewed. 11/09/2022:  Sinus rhythm. Rate 66 bpm. 06/21/2022: EKG was not ordered.  Recent Labs: No results found for requested labs within last 365 days.   Recent Lipid Panel    Component Value Date/Time   CHOL 216 (H) 01/26/2021 0950   TRIG 50 01/26/2021 0950   HDL 74 01/26/2021 0950   CHOLHDL 2.9 01/26/2021 0950   CHOLHDL 3 03/21/2016 1621   VLDL 10.8 03/21/2016 1621   LDLCALC 133 (H) 01/26/2021 0950    Physical Exam:    VS:  BP (!) 144/90 (BP Location: Right Arm, Patient Position: Sitting, Cuff Size: Large)   Pulse 66  , BMI There is no height or weight on file to calculate BMI. GENERAL:  Well appearing HEENT: Pupils equal round and reactive, fundi not visualized, oral mucosa unremarkable NECK:  No jugular venous distention, waveform within normal limits, carotid upstroke brisk and symmetric, no bruits, no thyromegaly LYMPHATICS:  No cervical adenopathy HEART:  RRR.  PMI not displaced or sustained,S1 and S2 within normal limits, no S3, no S4, no clicks, no rubs, 2/6 systolic murmur at the LUSB. ABD:  Flat, positive bowel sounds normal in frequency in pitch, no bruits, no rebound, no guarding, no midline pulsatile mass, no hepatomegaly, no splenomegaly EXT:  2 plus pulses throughout, no edema, no cyanosis, no clubbing SKIN:  No rashes, no nodules NEURO:  Cranial nerves II through XII grossly intact, motor grossly intact  throughout PSYCH:  Cognitively intact, oriented to person place and time   ASSESSMENT/PLAN:       # Hypertension Blood pressure readings have been variable, with some readings as high as 160/100. The patient reports stress as a contributing factor. Currently on amlodipine and Olmesartan. -Add hydrochlorothiazide 25mg  daily to the current regimen. -Check basic metabolic panel in one week to assess kidney function and potassium levels. -Encourage consistent exercise, such as neighborhood walks. -Enroll in remote patient monitoring study for more frequent blood pressure monitoring and  medication adjustments. -If BP remains uncontrolled on 3+ medications, will complete assessment of secondary causes.  # Visual disturbances The patient reports significant floaters and blurry vision, which is impacting her quality of life. Previous ophthalmology evaluation attributed this to age. -Recommend seeking a second opinion from another ophthalmologist. -Consider neurology referral if no ocular cause is identified.  # Depression The patient reports feeling depressed due to her health issues and personal stressors. She is currently on Cymbalta. -Recommend continued engagement with mental health services at [facility]. -Suggest exploring online therapy options for more consistent care. -Refer to health coach Renaee Munda for assistance with goal setting and strategizing.  # Obesity The patient reports this is the heaviest she has ever been and acknowledges the need for increased exercise. -Encourage regular exercise, such as neighborhood walks. -Refer to health coach Renaee Munda for assistance with goal setting and strategizing.  # Right knee pain The patient reports pain in her right knee, which is impacting her mobility and contributing to her depressive symptoms. -Consider referral to orthopedics for further evaluation and management if pain persists or worsens.      Screening for  Secondary Hypertension:     Relevant Labs/Studies:    Latest Ref Rng & Units 08/01/2021    3:27 PM 03/28/2020    8:38 PM 03/24/2020   11:14 AM  Basic Labs  Sodium 135 - 145 mmol/L 140  137  137   Potassium 3.5 - 5.1 mmol/L 3.8  3.8  3.8   Creatinine 0.44 - 1.00 mg/dL 1.91  4.78  2.95        Latest Ref Rng & Units 12/10/2018    4:40 PM 06/11/2017   10:55 AM  Thyroid   TSH 0.450 - 4.500 uIU/mL 1.600  3.40     Disposition:    FU with APP/PharmD in 1 month.  FU with Elecia Serafin C. Duke Salvia, MD, [facility] in 4 months.  Medication Adjustments/Labs and Tests Ordered: Current medicines are reviewed at length with the patient today.  Concerns regarding medicines are outlined above.   Orders Placed This Encounter  Procedures   Basic metabolic panel   Referral to HRT/VAS Care Navigation   Cantril's Ladder Assessment   EKG 12-Lead   Meds ordered this encounter  Medications   hydrochlorothiazide (HYDRODIURIL) 25 MG tablet    Sig: Take 1 tablet (25 mg total) by mouth daily.    Dispense:  90 tablet    Refill:  1   I,Mathew Stumpf,acting as a scribe for Chilton Si, MD.,have documented all relevant documentation on the behalf of Chilton Si, MD,as directed by  Chilton Si, MD while in the presence of Chilton Si, MD.  I, Dontae Minerva C. Duke Salvia, MD have reviewed all documentation for this visit.  The documentation of the exam, diagnosis, procedures, and orders on 11/09/2022 are all accurate and complete.  Signed, Chilton Si, MD  11/09/2022 2:44 PM    [facility]

## 2022-11-09 NOTE — Progress Notes (Signed)
Scheduled a call with patient for 11/10/2022 at 9:45am to set up initial call for health coaching.    Renaee Munda, MS, ERHD, [facility]  Care Guide, Health & Wellness Coach [address], [address] [place] [address] Telephone: [phone] Email: [email or web address]

## 2022-11-09 NOTE — Research (Signed)
  Subject Name: Dana Dunlap met inclusion and exclusion criteria for the Virtual Care and Social Determinant Interventions for the management of hypertension trial.  The informed consent form, study requirements and expectations were reviewed with the subject by Dr. Duke Salvia and myself. The subject was given the opportunity to read the consent and ask questions. The subject verbalized understanding of the trial requirements.  All questions were addressed prior to the signing of the consent form. The subject agreed to participate in the trial and signed the informed consent. The informed consent was obtained prior to performance of any protocol-specific procedures for the subject.  A copy of the signed informed consent was given to the subject and a copy was placed in the subject's medical record.  [name] was randomized to Group 2.

## 2022-11-09 NOTE — Patient Instructions (Addendum)
Medication Instructions:   START HCTZ (Hydrochlorothiazide) 25 mg daily.  Labwork: Your physician recommends that you return for lab work in: 1 week (BMET)  Please have this collected at [facility]. The lab is open 8:00 am - 4:30 pm. Please avoid 12:00p - 1:00p for lunch hour. You do not need an appointment. Please go to [address] [place], [place] [address]. This is in the Primary Care office on the 3rd floor, let them know you are there for blood work and they will direct you to the lab.  Follow-Up: 01/11/2023 10:05 am with Ronn Melena NP   03/14/2023 10:30 am with Dr Duke Salvia    MONITOR YOUR BLOOD PRESSURE TWICE A DAY WITH MACHINE PROVIDED, MAKE SURE YOU ARE LOGGED INTO YOUR VIVIFY APP   Amy will reach out to around 9:45 am tomorrow 8/9

## 2022-11-10 ENCOUNTER — Other Ambulatory Visit: Payer: Self-pay

## 2022-11-10 ENCOUNTER — Telehealth: Payer: Self-pay

## 2022-11-10 DIAGNOSIS — Z0000 Encounter for general adult medical examination without abnormal findings: Secondary | ICD-10-CM

## 2022-11-10 NOTE — Telephone Encounter (Signed)
Called patient to conduct Vivify welcome call patient is not having issues and did not have questions or need prompt changed. Patient was provided with contact information during visit yesterday. Offered health coaching per survey response regarding not having an eating plan. Patient stated that she is interested in monitoring her sodium intake. Patient was also referred for stress management. Patient is interested in participating in health coaching to improve eating behaviors and manage stress. Patient has been scheduled for her initial consult over the telephone.    Renaee Munda, MS, ERHD, [facility], [facility], [facility]  Care Guide, Health & Wellness Coach [address] [place] [address] Telephone: [phone] Email: [email or web address]

## 2022-11-13 ENCOUNTER — Telehealth: Payer: Self-pay

## 2022-11-13 ENCOUNTER — Telehealth: Payer: Self-pay | Admitting: Cardiovascular Disease

## 2022-11-13 NOTE — Telephone Encounter (Signed)
Patient returned call to office, transferred from call center  Patient states the she was called by someone named sharon at 1136am and she spoke to her with 15 minutes.  She states she was asked questions about where she was and if she was alone or not, and she would not give the  patient her last name.   Patient is convinced that someone is out to get her. She states someone from a private number called and asked her a bunch of questions and told her that her information would be given to Dr. Duke Salvia. Advised patient that we do not call from private numbers. Patient crying and screaming.   Spoke with Arline Asp from research to see if there is someone from the research team named sharon who might have called the patient. She was unaware of anyone on their team. Went into the vivify chart for nurses who may have reached out to the patient. There is a nurse named Wendelyn Breslow who reached out to the patient today, but there is nothing documented in the chart.   Reached out to social work for further help.

## 2022-11-13 NOTE — Telephone Encounter (Signed)
Returned call to patient after finding out who sharon was and talking with social work team. Patient has a lot of PTSD from a past pysch hospitalization. Patients therapists have both left her practice and she has not yet been able to get in with anyone else. Gave resources for help in the meantime, patient expressed thanks! Offered for patient to come out of the study to reduce calls to the patient from unknown sources, patient declines at this time.

## 2022-11-13 NOTE — Telephone Encounter (Signed)
STAT if HR is under 50 or over 120 (normal HR is 60-100 beats per minute)  What is your heart rate? 67    Do you have a log of your heart rate readings (document readings)? Readings between 40's and 70's with lower BP readings.   Do you have any other symptoms? Patient states that she is having issues with HR and low BP readings.  She states that she is feeling overwhelmed and lightheaded, and would like to speak with someone.  138/90 BP 67 HR - taken while on the phone. States she is taking her medication now.

## 2022-11-13 NOTE — Telephone Encounter (Signed)
Returned call to patient,   Patient states she has a monitor and she has been checking her blood pressure more due to the study, at night time her pulse is running in the 40s. She states this happened when she lived in [place] and was seen in the ED. She states she rushed to the ED due to her fingers being numb, it was attributed to carpal tunnel. She states her arms tingle in the morning. She does not believe that it is carpal tunnel . She states this weekend when her HR showed 43, she just kept checking. She states she called the ambulance 3 times because she felt like she didn't have a pulse. She states she was told by EMS it was in her head. She states she feels weak and overwhelmed when this happens.   Explained that it is normal for heart rate to be lower when we are at rest, esp at night time when we are laying down to go to sleep. Advised patient as long as blood pressure is normal and we feel okay then a lower HR okay. Explained to patient having increased anxiety can cause the symptoms she is feeling. Encouraged patient to follow up with PCP regarding carpal tunnel like symptoms.

## 2022-11-13 NOTE — Telephone Encounter (Signed)
H&V Care Navigation CSW Progress Note  Clinical Social Worker  received request for support  for pt. Pt expressing worry/paranoia after receiving calls from multiple staff members and being unable to verify where they are calling from.  I recommended first for staff to clarify with pt who was calling and what they were calling about to try and provide additional context.    If pt continues to remain in a crisis state I recommended that they see if she will be willing to seek in person additional support at our [facility] clinic which is open 24/7. If she is not amenable and staff are concerned further for safety the crisis line at [facility] of the [place] (pt was scheduled to see their provider for supportive counseling) and the number for the Behavioral Health Response Team through non emergency services was provided. Our team remains available to assist pt and team, with mindfulness to the fact that the pt is already concerned by the various staff members that have been calling.   Patient is participating in a Managed Medicaid Plan:  No- [employer] Managed [employer]  SDOH Screenings   Food Insecurity: No Food Insecurity (08/31/2022)  Housing: High Risk (08/31/2022)  Transportation Needs: No Transportation Needs (08/31/2022)  Utilities: Not At Risk (08/31/2022)  Alcohol Screen: Low Risk  (06/21/2022)  Depression (PHQ2-9): Medium Risk (07/27/2020)  Financial Resource Strain: High Risk (08/31/2022)  Physical Activity: Insufficiently Active (06/21/2022)  Social Connections: Socially Isolated (05/28/2017)  Stress: Stress Concern Present (08/31/2022)  Tobacco Use: Low Risk  (11/09/2022)  Health Literacy: Medium Risk (04/25/2022)   Received from [facility], [facility]    Octavio Graves, MSW, LCSW Clinical Social Worker [facility] Heart/Vascular Care Navigation  [phone]- work cell phone (preferred) [phone]- desk phone

## 2022-11-14 ENCOUNTER — Ambulatory Visit: Payer: MEDICAID

## 2022-11-14 ENCOUNTER — Telehealth: Payer: Self-pay

## 2022-11-14 DIAGNOSIS — Z0000 Encounter for general adult medical examination without abnormal findings: Secondary | ICD-10-CM

## 2022-11-14 NOTE — Telephone Encounter (Addendum)
Called patient to hold health coaching appointment for healthy eating and stress management. Patient did not want to hold appointment at this time. Cancelled appointment. Patient began to discuss issues that she has had with the health care system and began to cry at various times during the conversation. Patient was informed that her concerns and needs at this time are out of my scope of practiced. Ensured that the patient was not suicidal. Patient stated twice during the conversation that she was not going to harm herself or anyone else. Patient was provided with local mental health resources yesterday. Confirmed this with patient. Patient is waiting for a call today to see when she can been seen for therapy. Offered patient again the option for health coaching today. Patient declined and stated she wants to address these issues prior to working on eating habits. Will provide an update with Dr. Duke Salvia about patient interaction today.   Call time 9:45am -10:45am  Dana Crail Nedra Hai, MS, ERHD, [facility]  Care Guide, Health & Wellness Coach [address] [place] [address] Telephone: [phone] Email: [email or web address]

## 2022-11-20 ENCOUNTER — Encounter (HOSPITAL_BASED_OUTPATIENT_CLINIC_OR_DEPARTMENT_OTHER): Payer: Self-pay | Admitting: *Deleted

## 2022-11-20 ENCOUNTER — Telehealth (HOSPITAL_BASED_OUTPATIENT_CLINIC_OR_DEPARTMENT_OTHER): Payer: Self-pay | Admitting: *Deleted

## 2022-11-20 NOTE — Progress Notes (Signed)
Patient sent message via Vivify that participating in the program was making her anxiety worse. Sent Dr. Duke Salvia and Juliette Alcide a secure chat regarding patient's concern. Juliette Alcide replied that she will follow up with the patient.    Renaee Munda, MS, ERHD, [facility]  Care Guide, Health & Wellness Coach [address], [address] [place] [address] Telephone: [phone] Email: [email or web address]

## 2022-11-20 NOTE — Telephone Encounter (Signed)
Spoke with patient who sent a message in [employer] portal regarding low blood pressure and increased anxiety Yesterday blood pressure was 94/52 and she was not feeling well She has this same feeling when she misses her Cymbalta which she had not taken in few days.  Did advise patient she we could remove her from the study as our goal is not to give her anxiety. We would still continue to care for her blood pressure needs Stated she wanted to stay in study to find out what was going on Advised patient to continue to check blood pressure twice a day and if she is feeling poorly.  If continues to get SBP readings below 100 to call the office or send mychart message Patient verbalized understanding.  Also reiterated to patient pulling her from the study is always an option if she changed her mind to just let us know.  Will forward to Ronn Melena NP, Dr Duke Salvia, and Roxine Caddy D for review

## 2022-11-21 NOTE — Telephone Encounter (Signed)
Average SBP over last week in Viv[employer] of 125. Continue present medications. No hypotensive episodes noted in [employer].. If she gets hypotensive reading <110/60 recommend she eat and drink something as this will help to naturally raise BP. Agree with recommendation to contact us if SBP routinely <100.   Alver Sorrow, NP

## 2022-11-22 ENCOUNTER — Other Ambulatory Visit: Payer: Self-pay

## 2022-11-22 ENCOUNTER — Other Ambulatory Visit (HOSPITAL_BASED_OUTPATIENT_CLINIC_OR_DEPARTMENT_OTHER): Payer: Self-pay

## 2022-11-23 ENCOUNTER — Ambulatory Visit: Payer: MEDICAID

## 2022-11-23 ENCOUNTER — Other Ambulatory Visit (HOSPITAL_BASED_OUTPATIENT_CLINIC_OR_DEPARTMENT_OTHER): Payer: Self-pay

## 2022-11-30 ENCOUNTER — Other Ambulatory Visit (HOSPITAL_BASED_OUTPATIENT_CLINIC_OR_DEPARTMENT_OTHER): Payer: Self-pay | Admitting: Cardiovascular Disease

## 2022-11-30 ENCOUNTER — Encounter: Payer: Self-pay | Admitting: Oncology

## 2022-11-30 ENCOUNTER — Other Ambulatory Visit: Payer: Self-pay

## 2022-11-30 MED FILL — Atorvastatin Calcium Tab 10 MG (Base Equivalent): ORAL | 90 days supply | Qty: 90 | Fill #0 | Status: CN

## 2022-12-08 ENCOUNTER — Other Ambulatory Visit: Payer: Self-pay

## 2022-12-08 ENCOUNTER — Other Ambulatory Visit (HOSPITAL_BASED_OUTPATIENT_CLINIC_OR_DEPARTMENT_OTHER): Payer: Self-pay

## 2022-12-08 ENCOUNTER — Encounter: Payer: Self-pay | Admitting: Oncology

## 2022-12-08 MED FILL — Atorvastatin Calcium Tab 10 MG (Base Equivalent): ORAL | 90 days supply | Qty: 90 | Fill #0 | Status: CN

## 2022-12-14 ENCOUNTER — Telehealth: Payer: Self-pay | Admitting: Cardiovascular Disease

## 2022-12-14 NOTE — Telephone Encounter (Addendum)
Spoke with patient for almost 30 minutes Patient was crying most of the time during phone call  She talked mostly about how much she disliked [facility] and the care she received there, both medical and from landlord that evicted her.  Stated she was committed to psych unit against her will and feels the it was an act of racism.  Feels her blood pressures are getting too low, some days can hardly "get up and go" Reviewed blood pressure readings in [employer], has had a couple that SBP upper 90's Was having feelings yesterday when SBP was 118. After further discussion she stated it was probably coming from her mental challenges  Briefly discussed her right hand tingling, stated she had been to ED and had work up for both hands Was told had carpel tunnel  Per Dr Duke Salvia, needs to contact PCP. Advised patient Does have appointment with her therapist tomorrow  Strongly encouraged her to reach out to her therapist today instead of waiting until tomorrow Patient verbalized understanding and agreed to call   Did explain to patient [employer] App is just for [place] to monitor her blood pressure and if needs to speak with someone in office to call directly.

## 2022-12-14 NOTE — Telephone Encounter (Signed)
Patient is requesting to speak with Dr. Leonides Sake nurse. She states she has been experiencing right hand numbness. She states her hand just feels limp and this has been ongoing for a while. Patient states a few days ago she went to turn her ignition in her car and she was unable to do so using her right hand. Patient became very upset on the phone while mentioning that someone used to contact her weekly through an app, but no one ever reaches out to her or responds anymore. She states she has been sending message through the app and she wanted to make sure that her messages have been relayed. She states the app is normally used to log her BP.

## 2022-12-19 ENCOUNTER — Other Ambulatory Visit (HOSPITAL_BASED_OUTPATIENT_CLINIC_OR_DEPARTMENT_OTHER): Payer: Self-pay

## 2023-01-01 ENCOUNTER — Ambulatory Visit (HOSPITAL_BASED_OUTPATIENT_CLINIC_OR_DEPARTMENT_OTHER): Payer: Medicaid Other | Admitting: Cardiovascular Disease

## 2023-01-11 ENCOUNTER — Other Ambulatory Visit: Payer: Self-pay

## 2023-01-11 ENCOUNTER — Other Ambulatory Visit (HOSPITAL_BASED_OUTPATIENT_CLINIC_OR_DEPARTMENT_OTHER): Payer: Self-pay

## 2023-01-11 ENCOUNTER — Encounter (HOSPITAL_BASED_OUTPATIENT_CLINIC_OR_DEPARTMENT_OTHER): Payer: Self-pay | Admitting: Family

## 2023-01-11 ENCOUNTER — Ambulatory Visit (HOSPITAL_BASED_OUTPATIENT_CLINIC_OR_DEPARTMENT_OTHER): Payer: MEDICAID | Admitting: Family

## 2023-01-11 VITALS — BP 128/68 | HR 65 | Ht 60.0 in | Wt 182.4 lb

## 2023-01-11 DIAGNOSIS — F419 Anxiety disorder, unspecified: Secondary | ICD-10-CM

## 2023-01-11 DIAGNOSIS — I10 Essential (primary) hypertension: Secondary | ICD-10-CM

## 2023-01-11 DIAGNOSIS — E782 Mixed hyperlipidemia: Secondary | ICD-10-CM

## 2023-01-11 MED ORDER — TRAZODONE HCL 50 MG PO TABS
50.0000 mg | ORAL_TABLET | Freq: Every day | ORAL | 1 refills | Status: DC
  Filled 2023-01-11: qty 90, 90d supply, fill #0

## 2023-01-11 MED FILL — Atorvastatin Calcium Tab 10 MG (Base Equivalent): ORAL | 90 days supply | Qty: 90 | Fill #0 | Status: CN

## 2023-01-11 NOTE — Progress Notes (Signed)
Advanced Hypertension Clinic Assessment:    Date:  01/11/2023   ID:  Dana Dunlap, DOB Aug 04, 1970, MRN [id number]  PCP:  [facility]  Cardiologist:  None  Nephrologist:  Referring MD: [facility]   CC: Hypertension  History of Present Illness:    Dana Dunlap is a 52 y.o. female with a hx of HTN, HLD, prediabetes, anemia, PTSD, depression here to follow-up in the Advanced Hypertension Clinic.   Established with advanced hypertension clinic 06/21/2022.  She was understandably concerned about cardiovascular risk as both parents ASCVD but not premature.  Murmur noted on exam.  Blood pressure was mildly elevated and she is recommended to monitor at home and increase physical activity and continue present dose amlodipine. Echocardiogram ordered but not yet performed.  Coronary calcium score 07/26/2022 of 0 with no evidence of heart disease.  Seen 08/31/22. Amlodipine was transitioned to Amlodipine-Olmesartan 10-20mg  daily. At visit 11/09/22 hydrochlorothiazide was added to regimen.   Presents today for follow up. Remains understandably frustrated by her eyes. Wat told by optometrist yesterday that she needed glasses. Persistent blurry vision which is worsening, eyes are rid/itchy, "feels like sand in my eyes". Does not want to take steroid eye drops which would be second set which she was prescribed yesterday at her optometry visit. Last took steroir eye drops 04/2022. Told by previous eye doctors that it was just age related but she would like second opinion.   Numbness bilateral hands. Now radiating from right hand to elbow which is worse on waking. Known diagnosis of carpal tunnel. Encouraged to wear wrist guards at night.   BP at home has been controlled even hypotensive 90-100s/60s which is symptomatic with fatigue, lightheadedness.   Previous antihypertensives:   Past Medical History:  Diagnosis Date   Anemia    Depression    Currently on Cymbalta   Eye abnormalities     Fibroids    Frequent headaches    Worse during monthly menstrual cycle   Hypertension     Past Surgical History:  Procedure Laterality Date   BREAST MASS EXCISION     benign   KNEE SURGERY      Current Medications: No outpatient medications have been marked as taking for the 01/11/23 encounter (Appointment) with Alver Sorrow, NP.     Allergies:   Prochlorperazine, Tramadol, Compazine [prochlorperazine edisylate], and Other   Social History   Socioeconomic History   Marital status: Single    Spouse name: Not on file   Number of children: 0   Years of education: 16   Highest education level: Bachelor's degree (e.g., BA, AB, BS)  Occupational History   Occupation: Retail buyer: [employer]    Comment: Molecular biology  Tobacco Use   Smoking status: Never   Smokeless tobacco: Never  Vaping Use   Vaping status: Never Used  Substance and Sexual Activity   Alcohol use: Yes    Alcohol/week: 1.0 standard drink of alcohol    Types: 1 Glasses of wine per week   Drug use: No   Sexual activity: Yes    Birth control/protection: Condom  Other Topics Concern   Not on file  Social History Narrative   Dana Dunlap is originally from [place]. She attended [employer] in [place], [place] where she obtained her Scientist, water quality in Biology in 1995. 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 She is in a long term relationship with  her boyfried, Dana Dunlap. They have been together for 6 years. Dana Dunlap transferred to [facility] with [place] in January. She enjoys reading and she enjoys the outdoors. She loves to travel. 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   Social Determinants of Health   Financial Resource Strain: High Risk (08/31/2022)   Overall Financial Resource Strain (CARDIA)    Difficulty of Paying Living Expenses: Hard  Food Insecurity: No Food Insecurity (08/31/2022)   Hunger Vital Sign     Worried About Running Out of Food in the Last Year: Never true    Ran Out of Food in the Last Year: Never true  Transportation Needs: No Transportation Needs (08/31/2022)   PRAPARE - Administrator, Civil Service (Medical): No    Lack of Transportation (Non-Medical): No  Physical Activity: Insufficiently Active (06/21/2022)   Exercise Vital Sign    Days of Exercise per Week: 1 day    Minutes of Exercise per Session: 30 min  Stress: Stress Concern Present (08/31/2022)   [employer] of Occupational Health - Occupational Stress Questionnaire    Feeling of Stress : Very much  Social Connections: Socially Isolated (05/28/2017)   Social Connection and Isolation Panel [NHANES]    Frequency of Communication with Friends and Family: Once a week    Frequency of Social Gatherings with Friends and Family: Never    Attends Religious Services: Never    Database administrator or Organizations: No    Attends Engineer, structural: Never    Marital Status: Never married     Family History: The patient's family history includes Cancer in her father; Coronary artery disease in her father; Diabetes in her father and paternal aunt; Fibroids in her sister and sister; Hyperlipidemia in her father and mother; Hypertension in her father and mother; Sickle cell trait in her brother, sister, and another family member; Stroke in her mother and paternal aunt; Thalassemia in an other family member.  ROS:   Please see the history of present illness.    All other systems reviewed and are negative.  EKGs/Labs/Other Studies Reviewed:    EKG:  EKG is not ordered today.    Recent Labs: No results found for requested labs within last 365 days.   Recent Lipid Panel    Component Value Date/Time   CHOL 216 (H) 01/26/2021 0950   TRIG 50 01/26/2021 0950   HDL 74 01/26/2021 0950   CHOLHDL 2.9 01/26/2021 0950   CHOLHDL 3 03/21/2016 1621   VLDL 10.8 03/21/2016 1621   LDLCALC 133 (H)  01/26/2021 0950    Physical Exam:   VS:  There were no vitals taken for this visit. , BMI There is no height or weight on file to calculate BMI. GENERAL:  Well appearing HEENT: Pupils equal round and reactive, fundi not visualized, oral mucosa unremarkable NECK:  No jugular venous distention, waveform within normal limits, carotid upstroke brisk and symmetric, no bruits, no thyromegaly LYMPHATICS:  No cervical adenopathy LUNGS:  Clear to auscultation bilaterally HEART:  RRR.  PMI not displaced or sustained,S1 and S2 within normal limits, no S3, no S4, no clicks, no rubs, gr 2/6 systolic murmur at LUSB ABD:  Flat, positive bowel sounds normal in frequency in pitch, no bruits, no rebound, no guarding, no midline pulsatile mass, no hepatomegaly, no splenomegaly EXT:  2 plus pulses throughout, no edema, no cyanosis no clubbing SKIN:  No rashes no nodules NEURO:  Cranial nerves II through XII  grossly intact, motor grossly intact throughout PSYCH:  Cognitively intact, oriented to person place and time  ASSESSMENT/PLAN:     HTN - BP well controlled.  Hypotensive at home.  Reduce hydrochlorothiazide to half tablet 12.5 mg daily.  Continue amlodipine-olmesartan 10-20 mg daily. Discussed to monitor BP at home at least 2 hours after medications and sitting for 5-10 minutes.   Stress / Anxiety -contributory to hypertension. Encouraged to continue to follow with counselor.  Carpal tunnel -previously diagnosed.  Bilateral discomfort.  Encouraged to wear wrist guards at night. Continue to follow with PCP.   HLD - Elevated Lp(a). Calcium score of 0. Continue Lipitor 10mg  daily.   Eye issues- Given information on local opthamologists. Encouraged to trial steroid eye drop recommended by her optometrist.   Screening for Secondary Hypertension:     Relevant Labs/Studies:    Latest Ref Rng & Units 08/01/2021    3:27 PM 03/28/2020    8:38 PM 03/24/2020   11:14 AM  Basic Labs  Sodium 135 - 145 mmol/L  140  137  137   Potassium 3.5 - 5.1 mmol/L 3.8  3.8  3.8   Creatinine 0.44 - 1.00 mg/dL 1.61  0.96  0.45        Latest Ref Rng & Units 12/10/2018    4:40 PM 06/11/2017   10:55 AM  Thyroid   TSH 0.450 - 4.500 uIU/mL 1.600  3.40                  Disposition:    FU with MD/PharmD in 2 months   Medication Adjustments/Labs and Tests Ordered: Current medicines are reviewed at length with the patient today.  Concerns regarding medicines are outlined above.  No orders of the defined types were placed in this encounter.  No orders of the defined types were placed in this encounter.    Signed, Alver Sorrow, NP  01/11/2023 10:23 AM    [facility]

## 2023-01-11 NOTE — Patient Instructions (Addendum)
Medication Instructions:  Your physician has recommended you make the following change in your medication:   REDUCE Hydrochlorothiazide to half tablet (12.5mg ) daily    Labwork: Your physician recommends that you return for lab work today: BMP  Follow-Up: As scheduled in December    Special Instructions:   Bring your blood pressure cuff to your next office visit.   Recommend wearing wrist braces on your bilateral hands at night to help with your carpal tunnel pain.   To prevent palpitations: Make sure you are adequately hydrated.  Avoid and/or limit caffeine containing beverages like soda or tea. Exercise regularly.  Manage stress well. Some over the counter medications can cause palpitations such as Benadryl, AdvilPM, TylenolPM. Regular Advil or Tylenol do not cause palpitations.    Ophthalmologists:  [facility] [address] [place], [place] [address] Phone: [phone] Fax: [phone] Dr. Mateo Flow or Dr. Sharyn Dross  [facility] Phone: [phone] [address] [place], [place] [address] Dr. Maris Berger  [facility] [address] [place], [place] [address] Local: [phone]

## 2023-01-12 ENCOUNTER — Other Ambulatory Visit (HOSPITAL_BASED_OUTPATIENT_CLINIC_OR_DEPARTMENT_OTHER): Payer: Self-pay

## 2023-01-12 ENCOUNTER — Other Ambulatory Visit: Payer: Self-pay

## 2023-01-12 LAB — BASIC METABOLIC PANEL
BUN/Creatinine Ratio: 9 (ref 9–23)
BUN: 10 mg/dL (ref 6–24)
CO2: 23 mmol/L (ref 20–29)
Calcium: 10.2 mg/dL (ref 8.7–10.2)
Chloride: 105 mmol/L (ref 96–106)
Creatinine, Ser: 1.06 mg/dL — ABNORMAL HIGH (ref 0.57–1.00)
Glucose: 86 mg/dL (ref 70–99)
Potassium: 4.4 mmol/L (ref 3.5–5.2)
Sodium: 144 mmol/L (ref 134–144)
eGFR: 62 mL/min/{1.73_m2} (ref >59)

## 2023-01-12 MED ORDER — PREDNISOLONE ACETATE 1 % OP SUSP
1.0000 [drp] | Freq: Three times a day (TID) | OPHTHALMIC | 0 refills | Status: DC
  Filled 2023-01-12: qty 5, 10d supply, fill #0

## 2023-01-12 MED FILL — Atorvastatin Calcium Tab 10 MG (Base Equivalent): ORAL | 90 days supply | Qty: 90 | Fill #0 | Status: CN

## 2023-01-23 ENCOUNTER — Other Ambulatory Visit (HOSPITAL_BASED_OUTPATIENT_CLINIC_OR_DEPARTMENT_OTHER): Payer: Self-pay

## 2023-01-25 ENCOUNTER — Other Ambulatory Visit (HOSPITAL_BASED_OUTPATIENT_CLINIC_OR_DEPARTMENT_OTHER): Payer: Self-pay

## 2023-02-01 ENCOUNTER — Other Ambulatory Visit (HOSPITAL_BASED_OUTPATIENT_CLINIC_OR_DEPARTMENT_OTHER): Payer: Self-pay

## 2023-02-02 ENCOUNTER — Other Ambulatory Visit (HOSPITAL_BASED_OUTPATIENT_CLINIC_OR_DEPARTMENT_OTHER): Payer: Self-pay

## 2023-02-02 ENCOUNTER — Other Ambulatory Visit: Payer: Self-pay

## 2023-02-05 ENCOUNTER — Other Ambulatory Visit: Payer: Self-pay

## 2023-02-07 ENCOUNTER — Emergency Department (HOSPITAL_BASED_OUTPATIENT_CLINIC_OR_DEPARTMENT_OTHER): Payer: MEDICAID

## 2023-02-07 ENCOUNTER — Encounter (HOSPITAL_BASED_OUTPATIENT_CLINIC_OR_DEPARTMENT_OTHER): Payer: Self-pay | Admitting: Emergency Medicine

## 2023-02-07 ENCOUNTER — Emergency Department (HOSPITAL_BASED_OUTPATIENT_CLINIC_OR_DEPARTMENT_OTHER)
Admission: EM | Admit: 2023-02-07 | Discharge: 2023-02-07 | Disposition: A | Discharge status: Home / Self Care | Payer: MEDICAID | Attending: Emergency Medicine | Admitting: Emergency Medicine

## 2023-02-07 ENCOUNTER — Other Ambulatory Visit (HOSPITAL_BASED_OUTPATIENT_CLINIC_OR_DEPARTMENT_OTHER): Payer: Self-pay

## 2023-02-07 ENCOUNTER — Other Ambulatory Visit: Payer: Self-pay

## 2023-02-07 DIAGNOSIS — I10 Essential (primary) hypertension: Secondary | ICD-10-CM | POA: Diagnosis not present

## 2023-02-07 DIAGNOSIS — R200 Anesthesia of skin: Secondary | ICD-10-CM | POA: Insufficient documentation

## 2023-02-07 DIAGNOSIS — R202 Paresthesia of skin: Secondary | ICD-10-CM | POA: Insufficient documentation

## 2023-02-07 DIAGNOSIS — M25521 Pain in right elbow: Secondary | ICD-10-CM | POA: Diagnosis not present

## 2023-02-07 DIAGNOSIS — R531 Weakness: Secondary | ICD-10-CM | POA: Insufficient documentation

## 2023-02-07 DIAGNOSIS — Z79899 Other long term (current) drug therapy: Secondary | ICD-10-CM | POA: Insufficient documentation

## 2023-02-07 LAB — BASIC METABOLIC PANEL
Anion gap: 9 (ref 5–15)
BUN: 8 mg/dL (ref 6–20)
CO2: 26 mmol/L (ref 22–32)
Calcium: 10.1 mg/dL (ref 8.9–10.3)
Chloride: 108 mmol/L (ref 98–111)
Creatinine, Ser: 0.95 mg/dL (ref 0.44–1.00)
GFR, Estimated: >60 mL/min (ref >60)
Glucose, Bld: 88 mg/dL (ref 70–99)
Potassium: 3.8 mmol/L (ref 3.5–5.1)
Sodium: 143 mmol/L (ref 135–145)

## 2023-02-07 LAB — CBC WITH DIFFERENTIAL/PLATELET
Abs Immature Granulocytes: 0 10*3/uL (ref 0.00–0.07)
Basophils Absolute: 0 10*3/uL (ref 0.0–0.1)
Basophils Relative: 1 %
Eosinophils Absolute: 0.1 10*3/uL (ref 0.0–0.5)
Eosinophils Relative: 2 %
HCT: 38.4 % (ref 36.0–46.0)
Hemoglobin: 12.3 g/dL (ref 12.0–15.0)
Immature Granulocytes: 0 %
Lymphocytes Relative: 45 %
Lymphs Abs: 1.7 10*3/uL (ref 0.7–4.0)
MCH: 24.4 pg — ABNORMAL LOW (ref 26.0–34.0)
MCHC: 32 g/dL (ref 30.0–36.0)
MCV: 76 fL — ABNORMAL LOW (ref 80.0–100.0)
Monocytes Absolute: 0.3 10*3/uL (ref 0.1–1.0)
Monocytes Relative: 7 %
Neutro Abs: 1.8 10*3/uL (ref 1.7–7.7)
Neutrophils Relative %: 45 %
Platelets: 319 10*3/uL (ref 150–400)
RBC: 5.05 MIL/uL (ref 3.87–5.11)
RDW: 15.9 % — ABNORMAL HIGH (ref 11.5–15.5)
WBC: 3.8 10*3/uL — ABNORMAL LOW (ref 4.0–10.5)
nRBC: 0 % (ref 0.0–0.2)

## 2023-02-07 MED ORDER — DOXYCYCLINE HYCLATE 50 MG PO TABS
50.0000 mg | ORAL_TABLET | Freq: Every day | ORAL | 0 refills | Status: DC
  Filled 2023-02-07 – 2023-02-21 (×2): qty 14, 14d supply, fill #0

## 2023-02-07 MED ORDER — DOXYCYCLINE HYCLATE 100 MG PO TABS
100.0000 mg | ORAL_TABLET | Freq: Every day | ORAL | 0 refills | Status: AC
  Filled 2023-02-07: qty 14, 14d supply, fill #0

## 2023-02-07 MED ORDER — DOXYCYCLINE HYCLATE 50 MG PO TABS: 50.0000 mg | ORAL_TABLET | Freq: Every day | ORAL | 0 refills | Status: DC

## 2023-02-07 NOTE — Discharge Instructions (Addendum)
Your workup was reassuring.  Your head CT was reassuring.  Follow-up with neurology.  They should be contacting you with the information is listed above.

## 2023-02-07 NOTE — ED Triage Notes (Signed)
Right arm tingling in arm/hand x 2 months Constant. Getting worse. Left arm/ hand similar x 1 year. Right worse then left.  Dx carpal tunnel advised to wear brace. Feels it is getting worse and is very weak on right hand

## 2023-02-07 NOTE — ED Provider Notes (Signed)
[place] EMERGENCY DEPARTMENT AT [facility] Provider Note   CSN: [id number] Arrival date & time: 02/07/23  1516     History  Chief Complaint  Patient presents with   Numbness    Dana Dunlap is a 52 y.o. female.  HPI Patient presents with numbness tingling and weakness in both her hands.  Also pain in her right elbow.  States it goes down to the hand.  States she will get pain in both arms and hands at the same time.  Weakness always there but pain comes and goes.  Has been going for months now.  Had been seen at [facility] and reportedly diagnosed with carpal tunnel syndrome.  However at that time was only on the left hand.   Past Medical History:  Diagnosis Date   Anemia    Depression    Currently on Cymbalta   Eye abnormalities    Fibroids    Frequent headaches    Worse during monthly menstrual cycle   Hypertension     Home Medications Prior to Admission medications   Medication Sig Start Date End Date Taking? Authorizing Provider  acetaminophen (TYLENOL) 325 MG tablet Take 650 mg by mouth every 6 (six) hours as needed. Patient taking twice weekly    [provider]  amlodipine-olmesartan (AZOR) 10-20 MG tablet Take 1 tablet by mouth daily. 10/17/22   Alver Sorrow, NP  atorvastatin (LIPITOR) 10 MG tablet Take 1 tablet (10 mg total) by mouth daily. 11/30/22   Chilton Si, MD  Blood Pressure Monitoring (BLOOD PRESSURE KIT) DEVI 1 kit by Does not apply route daily. 03/08/21   Iloabachie, Chioma E, NP  doxycycline (VIBRA-TABS) 100 MG tablet Take 1 tablet (100 mg total) by mouth daily for 14 days. 02/07/23 02/21/23    Doxycycline Hyclate 50 MG TABS Take 50 mg by mouth daily. 02/07/23     Doxycycline Hyclate 50 MG TABS Take 50 mg by mouth daily. 02/07/23     DULoxetine (CYMBALTA) 30 MG capsule Take 90 mg by mouth daily.    [provider]  fluticasone (FLONASE) 50 MCG/ACT nasal spray PLACE 1 SPRAY INTO BOTH NOSTRILS ONCE DAILY 12/07/20 11/09/22   Iloabachie, Chioma E, NP  hydrochlorothiazide (HYDRODIURIL) 25 MG tablet Take 12.5 mg by mouth daily.    [provider]  hydrOXYzine (ATARAX) 25 MG tablet Take 1 tablet (25 mg total) by mouth 2 (two) times daily as needed. 04/25/22     meloxicam (MOBIC) 15 MG tablet Take 1 tablet by mouth once daily. Do not take with other oral NSAIDs. 08/26/21     nystatin-triamcinolone ointment (MYCOLOG) Apply topically Two (2) times a day. 10/12/21     prednisoLONE acetate (PRED FORTE) 1 % ophthalmic suspension Place 1 drop into both eyes 3 (three) times daily for 5 days,then twice daily into both eyes for 5 days 01/10/23     sodium chloride (OCEAN) 0.65 % SOLN nasal spray Place 1 spray into both nostrils as needed for congestion. 12/07/20   Iloabachie, Chioma E, NP  traZODone (DESYREL) 50 MG tablet Take 1 tablet (50 mg total) by mouth once nightly at bedtime as needed. 08/09/21     traZODone (DESYREL) 50 MG tablet Take 1 tablet (50 mg total) by mouth at bedtime. 01/11/23         Allergies    Prochlorperazine, Tramadol, Compazine [prochlorperazine edisylate], and Other    Review of Systems   Review of Systems  Physical Exam Updated Vital Signs BP 124/89 (  BP Location: Left Arm)   Pulse 71   Temp 97.9 F (36.6 C) (Oral)   Resp 18   Wt 82.7 kg   LMP 07/30/2021 (Approximate)   SpO2 100%   BMI 35.61 kg/m  Physical Exam Vitals and nursing note reviewed.  Eyes:     Extraocular Movements: Extraocular movements intact.  Cardiovascular:     Rate and Rhythm: Regular rhythm.  Chest:     Chest wall: No tenderness.  Abdominal:     Tenderness: There is no abdominal tenderness.  Musculoskeletal:     Comments: Good range of motion.  No tenderness.  No cervical spine tenderness.  No shoulder or elbow tenderness.  Good range of motion shoulders elbows wrists and hands.  Somewhat variable exam with strength testing but with distraction does have apparently good strength in bilateral hands.  May have some  slight weakness with dorsiflexion of the right hand but also not consistent.  Sensation grossly intact over radial median and ulnar distributions and patient is able to do a thumbs up cross her fingers and give an okay sign bilaterally.  No pain with prolonged flexion at the wrist.  Skin:    Comments: Patient's palms are pale.  Neurological:     Mental Status: She is alert and oriented to person, place, and time.  Psychiatric:     Comments: Patient is somewhat anxious.     ED Results / Procedures / Treatments   Labs (all labs ordered are listed, but only abnormal results are displayed) Labs Reviewed  CBC WITH DIFFERENTIAL/PLATELET - Abnormal; Notable for the following components:      Result Value   WBC 3.8 (*)    MCV 76.0 (*)    MCH 24.4 (*)    RDW 15.9 (*)    All other components within normal limits  BASIC METABOLIC PANEL    EKG None  Radiology CT Head Wo Contrast  Result Date: 02/07/2023 CLINICAL DATA:  Neurologic deficit.  Stroke suspected. EXAM: CT HEAD WITHOUT CONTRAST TECHNIQUE: Contiguous axial images were obtained from the base of the skull through the vertex without intravenous contrast. RADIATION DOSE REDUCTION: This exam was performed according to the departmental dose-optimization program which includes automated exposure control, adjustment of the mA and/or kV according to patient size and/or use of iterative reconstruction technique. COMPARISON:  Head CT dated 08/01/2021. FINDINGS: Evaluation is limited due to streak artifact caused by metallic external wires. Brain: The ventricles and sulci are appropriate size for the patient's age. The gray-white matter discrimination is preserved. There is no acute intracranial hemorrhage. No mass effect or midline shift. No extra-axial fluid collection. Vascular: No hyperdense vessel or unexpected calcification. Skull: Normal. Negative for fracture or focal lesion. Sinuses/Orbits: No acute finding. Other: None IMPRESSION: No acute  intracranial pathology. Electronically Signed   By: Elgie Collard M.D.   On: 02/07/2023 18:54    Procedures Procedures    Medications Ordered in ED Medications - No data to display  ED Course/ Medical Decision Making/ A&P                                 Medical Decision Making Amount and/or Complexity of Data Reviewed Labs: ordered. Radiology: ordered.   Patient with 2 months of pain numbness tingling and weakness in her bilateral upper extremities.  No neck pain.  Somewhat variable exam but does appear to have strength mostly intact.  Sensation also grossly intact.  No tenderness at the elbow.  Reviewed note from previous [facility] visit.  Reviewed cardiology note.  Reviewed blood work.  Blood work here reassuring.  Head CT done due to some bilateral symptoms without neck pain.  It was reassuring.  Patient now states there is more tingling in her medial fingers and there was before.  Still no tenderness.  Sensation still grossly intact.  Will have follow-up with neurology.  Appears stable for discharge home.        Final Clinical Impression(s) / ED Diagnoses Final diagnoses:  Numbness and tingling in both hands    Rx / DC Orders ED Discharge Orders          Ordered    Ambulatory referral to Neurology       Comments: An appointment is requested in approximately: 2 weeks   02/07/23 Thayer Headings, MD 02/07/23 1906

## 2023-02-08 ENCOUNTER — Other Ambulatory Visit (HOSPITAL_BASED_OUTPATIENT_CLINIC_OR_DEPARTMENT_OTHER): Payer: Self-pay

## 2023-02-08 ENCOUNTER — Encounter: Payer: Self-pay | Admitting: Oncology

## 2023-02-12 ENCOUNTER — Other Ambulatory Visit (HOSPITAL_BASED_OUTPATIENT_CLINIC_OR_DEPARTMENT_OTHER): Payer: Self-pay

## 2023-02-12 MED ORDER — DULOXETINE HCL 30 MG PO CPEP
30.0000 mg | ORAL_CAPSULE | Freq: Three times a day (TID) | ORAL | 0 refills | Status: DC
  Filled 2023-02-12 – 2023-02-23 (×2): qty 180, 60d supply, fill #0

## 2023-02-15 ENCOUNTER — Encounter: Payer: Self-pay | Admitting: Oncology

## 2023-02-21 ENCOUNTER — Emergency Department (HOSPITAL_COMMUNITY)
Admission: EM | Admit: 2023-02-21 | Discharge: 2023-02-21 | Disposition: A | Discharge status: Home / Self Care | Payer: MEDICAID | Attending: Emergency Medicine | Admitting: Emergency Medicine

## 2023-02-21 ENCOUNTER — Encounter (HOSPITAL_COMMUNITY): Payer: Self-pay

## 2023-02-21 ENCOUNTER — Other Ambulatory Visit: Payer: Self-pay

## 2023-02-21 ENCOUNTER — Emergency Department (HOSPITAL_COMMUNITY): Payer: MEDICAID

## 2023-02-21 ENCOUNTER — Other Ambulatory Visit (HOSPITAL_BASED_OUTPATIENT_CLINIC_OR_DEPARTMENT_OTHER): Payer: Self-pay

## 2023-02-21 DIAGNOSIS — M25531 Pain in right wrist: Secondary | ICD-10-CM | POA: Diagnosis not present

## 2023-02-21 DIAGNOSIS — R202 Paresthesia of skin: Secondary | ICD-10-CM | POA: Insufficient documentation

## 2023-02-21 DIAGNOSIS — R531 Weakness: Secondary | ICD-10-CM | POA: Diagnosis not present

## 2023-02-21 DIAGNOSIS — M25521 Pain in right elbow: Secondary | ICD-10-CM | POA: Insufficient documentation

## 2023-02-21 DIAGNOSIS — M25529 Pain in unspecified elbow: Secondary | ICD-10-CM

## 2023-02-21 MED ORDER — OXYCODONE-ACETAMINOPHEN 5-325 MG PO TABS
1.0000 | ORAL_TABLET | Freq: Four times a day (QID) | ORAL | 0 refills | Status: DC | PRN
  Filled 2023-02-21: qty 10, 3d supply, fill #0

## 2023-02-21 NOTE — ED Triage Notes (Signed)
Patient c/o right wrist pain with numbness, weakness, and tingling progressively getting worse x 2 months. Patient is tearful and anxious in triage about losing the ability to use her right hand. Patient also has similar symptoms in left wrist but much milder x1 year. Patient works in a lab setting with repetitive movements. 10/10 pain at this time.

## 2023-02-21 NOTE — ED Notes (Signed)
Labs drawn. Dr Wilkie Aye notified that the Pt was ready for discharge.

## 2023-02-21 NOTE — ED Notes (Signed)
According to the Pt, she apparently had a disagreement with another nurse. Pt had been seen and diagnosed with carpal tunnel syndrome.  Pt was given warm blanket, orange juice, and crackers.  Pt was looking at her phone and using both hands.

## 2023-02-21 NOTE — ED Notes (Signed)
Pt talking to [facility], ED charge RN. She is unhappy with being in the hall, she's cold, and no one is checking on her. She states "she wouldn't treat a dog like this". Pt has been brought blankets, checked on several times, and had her stretcher adjusted. Pt being moved to another stretcher closer to the charge desk.

## 2023-02-21 NOTE — Discharge Instructions (Signed)
You have been seen and discharged from the emergency department.  Your x-rays are normal.  I believe you are suffering from carpal tunnel or possible ulnar nerve compression involving the elbow.  You need to follow-up with orthopedic hand doctor for evaluation and treatment.  Wear the right wrist splint at night to prevent symptoms from worsening.  Take stronger pain medicine as needed.  Do not mix this medication with alcohol or other sedating medications. Do not drive or do heavy physical activity until you know how this medication affects you.  It may cause drowsiness.  Additional blood work testing has been sent off, these results will be available in a couple days in MyChart.  Follow-up with your primary provider for further evaluation and further care. Take home medications as prescribed. If you have any worsening symptoms or further concerns for your health please return to an emergency department for further evaluation.

## 2023-02-21 NOTE — ED Provider Notes (Signed)
[place] EMERGENCY DEPARTMENT AT [facility] Provider Note   CSN: [id number] Arrival date & time: 02/21/23  1344     History  Chief Complaint  Patient presents with   Wrist Pain    Dana Dunlap is a 52 y.o. female.  HPI   52 year old female presents emergency department with concern for right elbow pain and hand numbness/weakness.  Patient states years ago she had similar symptoms in her left hand, believes that she was diagnosed with carpal tunnel, this improved.  However recently over the past couple months she has been having similar symptoms in the right hand, including pins and needle sensation, intermittent clawhand, weakness.  She is not having some right elbow pain and mainly numbness in the right ring finger and left pinky.  Patient has a broad differential and is inquiring about possible Lyme disease, sickle cell, MS and infectious origin.  Has not seen orthopedic doctor.  Is seeking help for pain control.  Home Medications Prior to Admission medications   Medication Sig Start Date End Date Taking? Authorizing Provider  acetaminophen (TYLENOL) 325 MG tablet Take 650 mg by mouth every 6 (six) hours as needed for mild pain (pain score 1-3). Patient taking twice weekly   Yes [provider]  amlodipine-olmesartan (AZOR) 10-20 MG tablet Take 1 tablet by mouth daily. 10/17/22  Yes Alver Sorrow, NP  atorvastatin (LIPITOR) 10 MG tablet Take 1 tablet (10 mg total) by mouth daily. 11/30/22  Yes Chilton Si, MD  doxycycline (VIBRA-TABS) 100 MG tablet Take 1 tablet (100 mg total) by mouth daily for 14 days. 02/07/23 02/22/23 Yes   hydrochlorothiazide (HYDRODIURIL) 25 MG tablet Take 12.5 mg by mouth daily.   Yes [provider]  hydrOXYzine (ATARAX) 25 MG tablet Take 1 tablet (25 mg total) by mouth 2 (two) times daily as needed. Patient taking differently: Take 25 mg by mouth in the morning and at bedtime. 04/25/22  Yes   oxyCODONE-acetaminophen  (PERCOCET/ROXICET) 5-325 MG tablet Take 1 tablet by mouth every 6 (six) hours as needed for severe pain (pain score 7-10). 02/21/23  Yes Lukas Pelcher, Clabe Seal, DO  sodium chloride (OCEAN) 0.65 % SOLN nasal spray Place 1 spray into both nostrils as needed for congestion. 12/07/20  Yes Iloabachie, Chioma E, NP  traZODone (DESYREL) 50 MG tablet Take 1 tablet (50 mg total) by mouth once nightly at bedtime as needed. 08/09/21  Yes   Blood Pressure Monitoring (BLOOD PRESSURE KIT) DEVI 1 kit by Does not apply route daily. 03/08/21   Iloabachie, Chioma E, NP  fluticasone (FLONASE) 50 MCG/ACT nasal spray PLACE 1 SPRAY INTO BOTH NOSTRILS ONCE DAILY 12/07/20 11/09/22  Iloabachie, Chioma E, NP  prednisoLONE acetate (PRED FORTE) 1 % ophthalmic suspension Place 1 drop into both eyes 3 (three) times daily for 5 days,then twice daily into both eyes for 5 days Patient not taking: Reported on 02/21/2023 01/10/23     traZODone (DESYREL) 50 MG tablet Take 1 tablet (50 mg total) by mouth at bedtime. Patient not taking: Reported on 02/21/2023 01/11/23         Allergies    Prochlorperazine, Tramadol, Compazine [prochlorperazine edisylate], and Other    Review of Systems   Review of Systems  Constitutional:  Negative for fatigue and fever.  Respiratory:  Negative for shortness of breath.   Cardiovascular:  Negative for chest pain.  Musculoskeletal:  Negative for neck pain.       Right elbow pain  Neurological:  Palmar numbness of right fingers, palmar numbness of left pinky and ring finger    Physical Exam Updated Vital Signs BP 120/69   Pulse 70   Temp 98.1 F (36.7 C) (Oral)   Resp 16   Ht 5' (1.524 m)   Wt 81.6 kg   LMP 07/30/2021 (Approximate)   SpO2 100%   BMI 35.15 kg/m  Physical Exam Vitals and nursing note reviewed.  Constitutional:      Appearance: Normal appearance.  HENT:     Head: Normocephalic.     Mouth/Throat:     Mouth: Mucous membranes are moist.  Cardiovascular:     Rate and Rhythm:  Normal rate.  Pulmonary:     Effort: Pulmonary effort is normal. No respiratory distress.  Musculoskeletal:     Comments: Bilateral upper extremities are vascularly intact.  Patient has scattered decree sensation along the palmar aspect of the right hand/5 fingers, most prominent in the pinky and ring finger.  She has weakened grip strength which causes elbow pain and pain with palpation of the right elbow.  No overlying skin changes.  Skin:    General: Skin is warm.  Neurological:     Mental Status: She is alert and oriented to person, place, and time. Mental status is at baseline.  Psychiatric:        Mood and Affect: Mood normal.     ED Results / Procedures / Treatments   Labs (all labs ordered are listed, but only abnormal results are displayed) Labs Reviewed  LYME DISEASE SEROLOGY W/REFLEX  SICKLE CELL SCREEN    EKG None  Radiology DG Wrist Complete Right  Result Date: 02/21/2023 CLINICAL DATA:  Right wrist pain with numbness, weakness, and tingling progressively getting worse for 2 months. EXAM: RIGHT WRIST - COMPLETE 3+ VIEW COMPARISON:  None Available. FINDINGS: The wrist as well as the radius and ulna were imaged. No acute fracture is seen within the radius or ulna. Neutral ulnar variance. Mild degenerative spurring of the peripheral aspect of the distal radioulnar joint and thumb carpometacarpal joint. No acute fracture or dislocation. IMPRESSION: Mild osteoarthritis of the distal radioulnar joint and thumb carpometacarpal joint. Electronically Signed   By: Neita Garnet M.D.   On: 02/21/2023 17:32   DG Elbow 2 Views Right  Result Date: 02/21/2023 CLINICAL DATA:  Right elbow and wrist pain and numbness. EXAM: RIGHT ELBOW - 2 VIEW COMPARISON:  None Available. FINDINGS: Normal bone mineralization. Joint spaces are preserved. No joint effusion. Minimal degenerative spurring at the tip of the coronoid process. No acute fracture or dislocation. IMPRESSION: Minimal degenerative  spurring at the tip of the coronoid process. Electronically Signed   By: Neita Garnet M.D.   On: 02/21/2023 17:30    Procedures Procedures    Medications Ordered in ED Medications - No data to display  ED Course/ Medical Decision Making/ A&P                                 Medical Decision Making Amount and/or Complexity of Data Reviewed Labs: ordered. Radiology: ordered.  Risk Prescription drug management.   Patient presents with concern for right wrist/elbow pain, hand numbness/weakness.  This has been ongoing for the past 2 months.  History of carpal tunnel in the left wrist.  She is right-hand dominant.  Patient otherwise has no headache, neck pain, other acute neurologic complaints.  She is afebrile.  Patient has a long  list of differentials and is concerned about possible sickle cell, Lyme disease, MS.  On my exam, it is reassuring for peripheral neuropathy, most likely carpal tunnel or ulnar nerve involvement at the elbow.  I do not believe that this is central or neurologic at this time.  No neck pain or injury.  Will plan for outpatient orthopedic hand referral.  Patient has a resplinting with her and I instructed her to wear that overnight for conservative management of possible carpal tunnel syndrome.  She will be treated with pain management and referred to outpatient.  At her request Lyme testing and sickle cell testing has been sent off, she seems concerned that she could have had a tick bite relation with these diseases and she wants it officially tested.  She will follow-up in MyChart.  Patient at this time appears safe and stable for discharge and close outpatient follow up. Discharge plan and strict return to ED precautions discussed, patient verbalizes understanding and agreement.        Final Clinical Impression(s) / ED Diagnoses Final diagnoses:  Elbow pain, unspecified laterality    Rx / DC Orders ED Discharge Orders          Ordered     oxyCODONE-acetaminophen (PERCOCET/ROXICET) 5-325 MG tablet  Every 6 hours PRN        02/21/23 1837              Rozelle Logan, DO 02/21/23 2005

## 2023-02-22 ENCOUNTER — Other Ambulatory Visit (HOSPITAL_BASED_OUTPATIENT_CLINIC_OR_DEPARTMENT_OTHER): Payer: Self-pay

## 2023-02-22 LAB — LYME DISEASE SEROLOGY W/REFLEX: Lyme Total Antibody EIA: NEGATIVE

## 2023-02-22 LAB — SICKLE CELL SCREEN: Sickle Cell Screen: NEGATIVE

## 2023-02-23 ENCOUNTER — Telehealth: Payer: MEDICAID | Admitting: Nurse Practitioner

## 2023-02-23 ENCOUNTER — Encounter (HOSPITAL_BASED_OUTPATIENT_CLINIC_OR_DEPARTMENT_OTHER): Payer: Self-pay

## 2023-02-23 ENCOUNTER — Encounter (HOSPITAL_BASED_OUTPATIENT_CLINIC_OR_DEPARTMENT_OTHER): Payer: Self-pay | Admitting: Cardiovascular Disease

## 2023-02-23 ENCOUNTER — Telehealth: Payer: Self-pay | Admitting: Cardiovascular Disease

## 2023-02-23 ENCOUNTER — Encounter: Payer: Self-pay | Admitting: Nurse Practitioner

## 2023-02-23 ENCOUNTER — Other Ambulatory Visit (HOSPITAL_BASED_OUTPATIENT_CLINIC_OR_DEPARTMENT_OTHER): Payer: Self-pay

## 2023-02-23 DIAGNOSIS — Z760 Encounter for issue of repeat prescription: Secondary | ICD-10-CM

## 2023-02-23 MED ORDER — DULOXETINE HCL 30 MG PO CPEP
30.0000 mg | ORAL_CAPSULE | Freq: Three times a day (TID) | ORAL | 0 refills | Status: DC
  Filled 2023-03-29 – 2023-05-14 (×2): qty 180, 60d supply, fill #0

## 2023-02-23 NOTE — Progress Notes (Signed)
Patient was not present during time of Video Visit. On review of chart it appears she had a refill called in within the past hour from her [facility] office. Mychart message sent

## 2023-02-23 NOTE — Telephone Encounter (Signed)
Addressed via phone encounter 02/23/23.

## 2023-02-23 NOTE — Telephone Encounter (Signed)
Returned call to patient; she is very frustrated and tearful that she hasn't had her Cymbalta for two weeks. She has reached out to her former primary care and cardiology to get medications refilled. Told patient, unfortunately, cardiology cannot refill medication. She would need to reach out to her primary care. She wanted to be transferred to social worker, placed patient on hold to be transferred but all SW's have left. Call dropped.   Returned call to patient again and dicussed she could also go to Urgent Care to try and get medication refilled.

## 2023-02-23 NOTE — Telephone Encounter (Signed)
Addressed via phone encounter 02/23/23

## 2023-02-23 NOTE — Telephone Encounter (Signed)
Pt is calling back frustrated about refill.

## 2023-02-23 NOTE — Telephone Encounter (Signed)
Pt states that she needs to speak with a nurse. Please advise

## 2023-02-26 ENCOUNTER — Other Ambulatory Visit (HOSPITAL_BASED_OUTPATIENT_CLINIC_OR_DEPARTMENT_OTHER): Payer: Self-pay

## 2023-03-14 ENCOUNTER — Encounter (HOSPITAL_BASED_OUTPATIENT_CLINIC_OR_DEPARTMENT_OTHER): Payer: Self-pay | Admitting: Cardiovascular Disease

## 2023-03-14 ENCOUNTER — Ambulatory Visit (HOSPITAL_BASED_OUTPATIENT_CLINIC_OR_DEPARTMENT_OTHER): Payer: MEDICAID | Admitting: Cardiovascular Disease

## 2023-03-14 VITALS — BP 131/84 | HR 61 | Ht 60.0 in | Wt 176.2 lb

## 2023-03-14 DIAGNOSIS — E785 Hyperlipidemia, unspecified: Secondary | ICD-10-CM | POA: Diagnosis not present

## 2023-03-14 DIAGNOSIS — Z6834 Body mass index (BMI) 34.0-34.9, adult: Secondary | ICD-10-CM | POA: Diagnosis not present

## 2023-03-14 DIAGNOSIS — I10 Essential (primary) hypertension: Secondary | ICD-10-CM | POA: Diagnosis not present

## 2023-03-14 DIAGNOSIS — Z5181 Encounter for therapeutic drug level monitoring: Secondary | ICD-10-CM

## 2023-03-14 NOTE — Patient Instructions (Addendum)
Medication Instructions:  Your physician recommends that you continue on your current medications as directed. Please refer to the Current Medication list given to you today.  Labwork: Fasting LP/BMET/A1C/LFT'S SOON   Testing/Procedures: none  Follow-Up: 6 months in [facility] HTN clinic   Any Other Special Instructions Will Be Listed Below (If Applicable). Dr. Dorothyann Peng Dr. Abbe Amsterdam Dr. Jonah Blue  If you need a refill on your cardiac medications before your next appointment, please call your pharmacy.

## 2023-03-14 NOTE — Progress Notes (Signed)
[facility] Follow-up:    Date:  03/14/2023   ID:  Dana Dunlap, DOB May 30, 1970, MRN [id number]  PCP:  Nolberto Hanlon, PA-C  Cardiologist:  None  Nephrologist:  Referring MD: [facility]   CC: Hypertension  History of Present Illness:    Dana Dunlap is a 52 y.o. female with a hx of hypertension, hyperlipidemia, prediabetes, anemia, PTSD, depression, alpha thalassemia silent carrier, here for follow-up. She was initially seen 06/21/2022 in the [facility]. She was seen by Demetrios Loll, PA 05/02/2022 and her blood pressure was uncontrolled on amlodipine 5 mg. Her amlodipine was increased to 10 mg and she requested a referral to cardiology. On 10 mg atorvastatin for hyperlipidemia. She was also struggling with increased anxiety and depression but planned to resume seeing a therapist.  At her initial visit, she reported home blood pressures previously in the 140s-150s, but improved to 130s systolic after increasing amlodipine to 10 mg daily. Given a family history of ASCVD in both parents, she was interested in calcium scoring. Coronary CT 07/2022 revealed coronary calcium score of 0 without evidence of heart disease. LP(a) 07/2022 was elevated to 133.5. She followed up with Gillian Shields, NP 08/2022 and continued to struggle with persistent stress which she felt was contributory to her hypertension. Home readings were consistently above goal. Amlodipine was stopped and switched to amlodipine-olmesartan 10-20 mg daily. She had an echo 11/2022 showing LVEF 60-65% with normal diastolic parameters, degenerative mitral valve without regurgitation.  At her visit 11/2022 she was very stressed.  Home blood pressure was averaging in the 130s to 160s.  It was 148/95 in the office.  She was trying to increase her exercise.  Hydrochlorothiazide was added to her regimen and she was enrolled in the remote patient monitoring study.  She followed up with Gillian Shields, NP and blood pressures were low so HCTZ was reduced to 12.5 mg.  She has not been checking her blood pressure recently, but when she does it averages in the 110s to 120s.  Dana Dunlap presents with a chief complaint of severe pain and weakness in the right hand, which she describes as a '10' on the pain scale. The pain is localized near the elbow and is associated with numbness and tingling in the fingers. The patient reports difficulty with tasks requiring hand strength, such as turning keys and opening a rubber band. The symptoms have led to two recent visits to the emergency department, where carpal tunnel syndrome was suggested as a possible diagnosis.  Dana Dunlap also reports a fall in a parking lot in April of this year, which resulted in significant swelling and pain in the hands. The pain was described as a 'vibrating' sensation that lasted for hours.  She has not seen a specialist for these symptoms but has an upcoming appointment with a hand specialist tomorrow.  In addition to the hand symptoms, she has a history of alpha thalassemia and has been receiving iron infusions. She reports a family history of the same condition, with siblings also having sickle cell anemia traits. She expresses concern about the potential impact of these conditions on her current symptoms.  Dana Dunlap recently moved to [facility] from [place] and does not have a primary care provider in her new location. She expresses frustration with navigating the healthcare system and reports feeling ignored by healthcare providers.  She also reports significant emotional distress, including frequent crying and feelings of depression.  Despite these challenges, she  reports making positive lifestyle changes, including regular exercise and a healthy diet. She has a goal of losing weight and returning to her previous level of health.   Her blood pressure has been well-controlled on her current medication regimen.      Previous antihypertensives:   Past Medical History:  Diagnosis Date   Anemia    Depression    Currently on Cymbalta   Eye abnormalities    Fibroids    Frequent headaches    Worse during monthly menstrual cycle   Hypertension     Past Surgical History:  Procedure Laterality Date   BREAST MASS EXCISION     benign   KNEE SURGERY      Current Medications: Current Meds  Medication Sig   acetaminophen (TYLENOL) 325 MG tablet Take 650 mg by mouth every 6 (six) hours as needed for mild pain (pain score 1-3). Patient taking twice weekly   amlodipine-olmesartan (AZOR) 10-20 MG tablet Take 1 tablet by mouth daily.   atorvastatin (LIPITOR) 10 MG tablet Take 1 tablet (10 mg total) by mouth daily.   Blood Pressure Monitoring (BLOOD PRESSURE KIT) DEVI 1 kit by Does not apply route daily.   DULoxetine (CYMBALTA) 30 MG capsule Take 1 capsule (30 mg total) by mouth 3 (three) times daily.   hydrochlorothiazide (HYDRODIURIL) 25 MG tablet Take 12.5 mg by mouth daily.   hydrOXYzine (ATARAX) 25 MG tablet Take 1 tablet (25 mg total) by mouth 2 (two) times daily as needed. (Patient taking differently: Take 25 mg by mouth in the morning and at bedtime.)   oxyCODONE-acetaminophen (PERCOCET/ROXICET) 5-325 MG tablet Take 1 tablet by mouth every 6 (six) hours as needed for severe pain (pain score 7-10).   prednisoLONE acetate (PRED FORTE) 1 % ophthalmic suspension Place 1 drop into both eyes 3 (three) times daily for 5 days,then twice daily into both eyes for 5 days   sodium chloride (OCEAN) 0.65 % SOLN nasal spray Place 1 spray into both nostrils as needed for congestion.   traZODone (DESYREL) 50 MG tablet Take 1 tablet (50 mg total) by mouth once nightly at bedtime as needed.     Allergies:   Prochlorperazine, Tramadol, Compazine [prochlorperazine edisylate], and Other   Social History   Socioeconomic History   Marital status: Single    Spouse name: Not on file   Number of children: 0   Years of  education: 16   Highest education level: Bachelor's degree (e.g., BA, AB, BS)  Occupational History   Occupation: Retail buyer: [employer]    Comment: Molecular biology  Tobacco Use   Smoking status: Never   Smokeless tobacco: Never  Vaping Use   Vaping status: Never Used  Substance and Sexual Activity   Alcohol use: Yes    Alcohol/week: 1.0 standard drink of alcohol    Types: 1 Glasses of wine per week   Drug use: No   Sexual activity: Yes    Birth control/protection: Condom  Other Topics Concern   Not on file  Social History Narrative   Cheryce is originally from [place]. She attended [employer] in [place], [place] where she obtained her Scientist, water quality in Biology in 1995. 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 She is in a long term relationship with her boyfried, [name]. They have been together for 6 years. [name] transferred to [facility] with [employer] in January. She enjoys reading and she  enjoys the outdoors. She loves to travel. 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   Social Determinants of Health   Financial Resource Strain: High Risk (08/31/2022)   Overall Financial Resource Strain (CARDIA)    Difficulty of Paying Living Expenses: Hard  Food Insecurity: No Food Insecurity (08/31/2022)   Hunger Vital Sign    Worried About Running Out of Food in the Last Year: Never true    Ran Out of Food in the Last Year: Never true  Transportation Needs: No Transportation Needs (08/31/2022)   PRAPARE - Administrator, Civil Service (Medical): No    Lack of Transportation (Non-Medical): No  Physical Activity: Insufficiently Active (06/21/2022)   Exercise Vital Sign    Days of Exercise per Week: 1 day    Minutes of Exercise per Session: 30 min  Stress: Stress Concern Present (08/31/2022)   [employer] of Occupational Health - Occupational Stress Questionnaire    Feeling of  Stress : Very much  Social Connections: Socially Isolated (05/28/2017)   Social Connection and Isolation Panel [NHANES]    Frequency of Communication with Friends and Family: Once a week    Frequency of Social Gatherings with Friends and Family: Never    Attends Religious Services: Never    Database administrator or Organizations: No    Attends Engineer, structural: Never    Marital Status: Never married     Family History: The patient's family history includes Cancer in her father; Coronary artery disease in her father; Diabetes in her father and paternal aunt; Fibroids in her sister and sister; Hyperlipidemia in her father and mother; Hypertension in her father and mother; Sickle cell trait in her brother, sister, and another family member; Stroke in her mother and paternal aunt; Thalassemia in an other family member.  ROS:   Please see the history of present illness.    (+) Stress (+) Shortness of breath (+) Right knee pain (+) Blurry vision, eye floaters All other systems reviewed and are negative.  EKGs/Labs/Other Studies Reviewed:    Echocardiogram  11/02/2022:  1. Left ventricular ejection fraction, by estimation, is 60 to 65%. The  left ventricle has normal function. The left ventricle has no regional  wall motion abnormalities. Left ventricular diastolic parameters were  normal. The average left ventricular  global longitudinal strain is -20.0 %. The global longitudinal strain is  normal.   2. Right ventricular systolic function is normal. The right ventricular  size is normal.   3. The mitral valve is degenerative. No evidence of mitral valve  regurgitation.   4. The aortic valve is grossly normal. Aortic valve regurgitation is not  visualized.   5. The inferior vena cava is normal in size with greater than 50%  respiratory variability, suggesting right atrial pressure of 3 mmHg.   CT Cardiac Scoring  07/26/2022: IMPRESSION: Coronary calcium score of  0.   EKG:  EKG is personally reviewed. 11/09/2022:  Sinus rhythm. Rate 66 bpm. 06/21/2022: EKG was not ordered.  Recent Labs: 02/07/2023: BUN 8; Creatinine, Ser 0.95; Hemoglobin 12.3; Platelets 319; Potassium 3.8; Sodium 143   Recent Lipid Panel    Component Value Date/Time   CHOL 216 (H) 01/26/2021 0950   TRIG 50 01/26/2021 0950   HDL 74 01/26/2021 0950   CHOLHDL 2.9 01/26/2021 0950   CHOLHDL 3 03/21/2016 1621   VLDL 10.8 03/21/2016 1621   LDLCALC 133 (H) 01/26/2021 0950    Physical Exam:  VS:  BP 131/84 (BP Location: Left Arm, Patient Position: Sitting, Cuff Size: Large)   Pulse 61   Ht 5' (1.524 m)   Wt 176 lb 3.2 oz (79.9 kg)   LMP 07/30/2021 (Approximate)   SpO2 99%   BMI 34.41 kg/m  , BMI Body mass index is 34.41 kg/m. GENERAL:  Well appearing HEENT: Pupils equal round and reactive, fundi not visualized, oral mucosa unremarkable NECK:  No jugular venous distention, waveform within normal limits, carotid upstroke brisk and symmetric, no bruits, no thyromegaly HEART:  RRR.  PMI not displaced or sustained,S1 and S2 within normal limits, no S3, no S4, no clicks, no rubs, 2/6 systolic murmur at the LUSB. ABD:  Flat, positive bowel sounds normal in frequency in pitch, no bruits, no rebound, no guarding, no midline pulsatile mass, no hepatomegaly, no splenomegaly EXT:  2 plus pulses throughout, no edema, no cyanosis, no clubbing SKIN:  No rashes, no nodules NEURO:  Cranial nerves II through XII grossly intact, motor grossly intact throughout PSYCH:  Cognitively intact, oriented to person place and time  ASSESSMENT/PLAN:       # Upper extremity weakness and numbness Patient reports severe pain, numbness, tingling, and weakness in her right hand, with less severe symptoms in her left hand. Symptoms are severe enough to interfere with daily activities. Patient has a history of a fall earlier this year, which may be related. Patient has been evaluated in the ED and has an  upcoming appointment with a hand specialist. -Continue with scheduled appointment with hand specialist at [facility].  # Hypertension Well controlled on current regimen of amlodipine, olmesartan, and hydrochlorothiazide. Home blood pressure readings in the 120s. -Continue current antihypertensive regimen.  # General Health Maintenance / Followup Plans -Order lipid panel and comprehensive metabolic panel today. -Encourage continued healthy diet and exercise. -Recommend finding a new primary care provider to help navigate healthcare system and coordinate care. -Schedule follow-up appointment in 6 months.      Screening for Secondary Hypertension:     Relevant Labs/Studies:    Latest Ref Rng & Units 02/07/2023    5:04 PM 01/11/2023   11:07 AM 08/01/2021    3:27 PM  Basic Labs  Sodium 135 - 145 mmol/L 143  144  140   Potassium 3.5 - 5.1 mmol/L 3.8  4.4  3.8   Creatinine 0.44 - 1.00 mg/dL 1.61  0.96  0.45        Latest Ref Rng & Units 12/10/2018    4:40 PM 06/11/2017   10:55 AM  Thyroid   TSH 0.450 - 4.500 uIU/mL 1.600  3.40     Disposition:    FU with APP/PharmD in 1 month.  FU with Nicholi Ghuman C. Duke Salvia, MD, [facility] in 4 months.  Medication Adjustments/Labs and Tests Ordered: Current medicines are reviewed at length with the patient today.  Concerns regarding medicines are outlined above.   Orders Placed This Encounter  Procedures   Lipid panel   HgB A1c   Hepatic function panel   No orders of the defined types were placed in this encounter.    Signed, Chilton Si, MD  03/14/2023 5:10 PM    [facility]

## 2023-03-29 ENCOUNTER — Other Ambulatory Visit (HOSPITAL_BASED_OUTPATIENT_CLINIC_OR_DEPARTMENT_OTHER): Payer: Self-pay

## 2023-03-29 ENCOUNTER — Other Ambulatory Visit: Payer: Self-pay

## 2023-03-29 MED FILL — Atorvastatin Calcium Tab 10 MG (Base Equivalent): ORAL | 90 days supply | Qty: 90 | Fill #0 | Status: CN

## 2023-04-09 ENCOUNTER — Other Ambulatory Visit (HOSPITAL_BASED_OUTPATIENT_CLINIC_OR_DEPARTMENT_OTHER): Payer: Self-pay

## 2023-05-14 ENCOUNTER — Encounter: Payer: Self-pay | Admitting: Oncology

## 2023-05-14 ENCOUNTER — Other Ambulatory Visit (HOSPITAL_BASED_OUTPATIENT_CLINIC_OR_DEPARTMENT_OTHER): Payer: Self-pay

## 2023-05-14 MED FILL — Atorvastatin Calcium Tab 10 MG (Base Equivalent): ORAL | 90 days supply | Qty: 90 | Fill #0 | Status: AC

## 2023-05-21 ENCOUNTER — Other Ambulatory Visit (HOSPITAL_BASED_OUTPATIENT_CLINIC_OR_DEPARTMENT_OTHER): Payer: Self-pay

## 2023-05-21 MED ORDER — CYCLOSPORINE 0.05 % OP EMUL
1.0000 [drp] | Freq: Two times a day (BID) | OPHTHALMIC | 4 refills | Status: AC
  Filled 2023-05-21: qty 120, 60d supply, fill #0
  Filled 2023-11-28: qty 120, 60d supply, fill #1
  Filled 2024-02-22: qty 120, 60d supply, fill #2

## 2023-05-22 ENCOUNTER — Other Ambulatory Visit: Payer: Self-pay

## 2023-05-22 ENCOUNTER — Encounter: Payer: Self-pay | Admitting: Neurology

## 2023-05-22 ENCOUNTER — Ambulatory Visit (INDEPENDENT_AMBULATORY_CARE_PROVIDER_SITE_OTHER): Payer: MEDICAID | Admitting: Neurology

## 2023-05-22 VITALS — BP 131/83 | HR 102 | Ht 60.0 in | Wt 183.0 lb

## 2023-05-22 DIAGNOSIS — G5603 Carpal tunnel syndrome, bilateral upper limbs: Secondary | ICD-10-CM

## 2023-05-22 NOTE — Progress Notes (Signed)
[facility]  PATIENT: Dana Dunlap DOB: 05/29/1970  REQUESTING CLINICIAN: Benjiman Core, MD HISTORY FROM: Patient   REASON FOR VISIT: Bilateral hands weakness and numbness    HISTORICAL  CHIEF COMPLAINT:  Chief Complaint  Patient presents with   Room 12    Pt is here Alone. Pt states that her numbness and tingling started 2 years ago in her left hand. Pt states that she had fallen 2 years ago and believes because of the fall the numbness and tingling traveled to her right hand. Pt states that she was told she has carpal tunnel but her pain starts from her elbow down to her hand.      HISTORY OF PRESENT ILLNESS:  This is a 53 year old woman past medical history of hypertension, hyperlipidemia, anxiety/depression who is presenting with bilateral hands numbness.  Patient reports the symptoms started 2 years ago with the left hand numbness and pain.  Then this past November, she started experiencing pain in the right hand.  She reports at this time, the pain subsided but every morning she will have bilateral hands numbness, finger locking and weakness.  She was told at one point that she does have carpal tunnel syndrome but never had a EMG nerve conduction study.  She does not use any braces at night.   OTHER MEDICAL CONDITIONS: Hypertension, Hyperlipidemia, Anxiety/Depression    REVIEW OF SYSTEMS: Full 14 system review of systems performed and negative with exception of: As noted in the HPI  ALLERGIES: Allergies  Allergen Reactions   Prochlorperazine Anaphylaxis and Other (See Comments)    Seizure Seizure Other reaction(s): Other (See Comments), Other (See Comments), Other (See Comments) Seizure Seizure Seizure Seizure AKA Prozac  Other reaction(s):  Seizure Seizure   Tramadol Hives   Compazine [Prochlorperazine Edisylate] Other (See Comments)    Seizure    Other     Sneezing and red eyes    HOME MEDICATIONS: Outpatient Medications Prior  to Visit  Medication Sig Dispense Refill   acetaminophen (TYLENOL) 325 MG tablet Take 650 mg by mouth every 6 (six) hours as needed for mild pain (pain score 1-3). Patient taking twice weekly     amlodipine-olmesartan (AZOR) 10-20 MG tablet Take 1 tablet by mouth daily. 90 tablet 2   atorvastatin (LIPITOR) 10 MG tablet Take 1 tablet (10 mg total) by mouth daily. 90 tablet 3   cycloSPORINE (RESTASIS) 0.05 % ophthalmic emulsion Place 1 drop into both eyes 2 (two) times daily. 120 each 4   DULoxetine (CYMBALTA) 30 MG capsule Take 1 capsule (30 mg total) by mouth 3 (three) times daily. 180 capsule 0   hydrochlorothiazide (HYDRODIURIL) 25 MG tablet Take 12.5 mg by mouth daily.     hydrOXYzine (ATARAX) 25 MG tablet Take 1 tablet (25 mg total) by mouth 2 (two) times daily as needed. (Patient taking differently: Take 25 mg by mouth in the morning and at bedtime.) 60 tablet 3   oxyCODONE-acetaminophen (PERCOCET/ROXICET) 5-325 MG tablet Take 1 tablet by mouth every 6 (six) hours as needed for severe pain (pain score 7-10). 10 tablet 0   sodium chloride (OCEAN) 0.65 % SOLN nasal spray Place 1 spray into both nostrils as needed for congestion. 30 mL 0   traZODone (DESYREL) 50 MG tablet Take 1 tablet (50 mg total) by mouth once nightly at bedtime as needed. 30 tablet 1   Blood Pressure Monitoring (BLOOD PRESSURE KIT) DEVI 1 kit by Does not apply route daily. (Patient not taking: Reported on  05/22/2023) 1 each 0   fluticasone (FLONASE) 50 MCG/ACT nasal spray PLACE 1 SPRAY INTO BOTH NOSTRILS ONCE DAILY 16 g 2   prednisoLONE acetate (PRED FORTE) 1 % ophthalmic suspension Place 1 drop into both eyes 3 (three) times daily for 5 days,then twice daily into both eyes for 5 days (Patient not taking: Reported on 05/22/2023) 5 mL 0   No facility-administered medications prior to visit.    PAST MEDICAL HISTORY: Past Medical History:  Diagnosis Date   Anemia    Depression    Currently on Cymbalta   Eye abnormalities     Fibroids    Frequent headaches    Worse during monthly menstrual cycle   Hypertension     PAST SURGICAL HISTORY: Past Surgical History:  Procedure Laterality Date   BREAST MASS EXCISION     benign   KNEE SURGERY      FAMILY HISTORY: Family History  Problem Relation Age of Onset   Hypertension Mother    Hyperlipidemia Mother    Stroke Mother    Diabetes Father    Cancer Father        Prostate cancer   Hypertension Father    Hyperlipidemia Father    Coronary artery disease Father    Fibroids Sister    Sickle cell trait Sister    Fibroids Sister    Sickle cell trait Brother    Stroke Paternal Aunt    Diabetes Paternal Aunt    Sickle cell trait Other    Thalassemia Other     SOCIAL HISTORY: Social History   Socioeconomic History   Marital status: Single    Spouse name: Not on file   Number of children: 0   Years of education: 16   Highest education level: Bachelor's degree (e.g., BA, AB, BS)  Occupational History   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 She attended [employer] in [place], [place] where she obtained her Scientist, water quality in Biology in 1995. 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 She is in a long term relationship with her boyfried, [name]. They have been together for 6 years. [name] transferred to [employer] with [place] in January. She enjoys reading and she enjoys the outdoors. She loves to travel. 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   Social Drivers of Corporate investment banker Strain: High Risk (08/31/2022)   Overall Financial Resource Strain (CARDIA)    Difficulty of Paying Living Expenses: Hard  Food Insecurity: No Food Insecurity (08/31/2022)   Hunger Vital Sign    Worried About Running Out of Food in the Last Year: Never true    Ran Out of Food in the Last Year: Never true  Transportation Needs: No Transportation Needs (08/31/2022)   PRAPARE - Administrator, Civil Service (Medical): No    Lack of Transportation (Non-Medical): No  Physical Activity: Insufficiently Active (06/21/2022)   Exercise Vital Sign    Days of Exercise per Week: 1 day    Minutes of Exercise per Session: 30 min  Stress: Stress Concern Present (08/31/2022)   [employer] of Occupational Health - Occupational Stress Questionnaire    Feeling of Stress : Very much  Social Connections: Socially Isolated (05/28/2017)   Social Connection and Isolation Panel [NHANES]    Frequency of Communication with Friends and Family: Once a week    Frequency of Social Gatherings with Friends and Family: Never    Attends Religious Services: Never    Database administrator or Organizations: No    Attends Banker Meetings: Never    Marital Status: Never married  Intimate Partner Violence: Not At Risk (04/25/2022)   Received from [facility], [facility]   Humiliation, Afraid, Rape, and Kick questionnaire    Fear of Current or Ex-Partner: No    Emotionally Abused: No    Physically Abused: No    Sexually Abused: No     PHYSICAL EXAM  GENERAL EXAM/CONSTITUTIONAL: Vitals:  Vitals:   05/22/23 0829  BP: 131/83  Pulse: (!) 102  Weight: 183 lb (83 kg)  Height: 5' (1.524 m)   Body mass index is 35.74 kg/m. Wt Readings from Last 3 Encounters:  05/22/23 183 lb (83 kg)  03/14/23 176 lb 3.2 oz (79.9 kg)  02/21/23 180 lb (81.6 kg)   Patient is in no distress; well developed, nourished  and groomed; neck is supple  MUSCULOSKELETAL: Gait, strength, tone, movements noted in Neurologic exam below  NEUROLOGIC: MENTAL STATUS:      No data to display         awake, alert, oriented to person, place and time recent and remote memory intact normal attention and concentration language fluent, comprehension intact, naming intact fund of knowledge appropriate  CRANIAL NERVE:  2nd, 3rd, 4th, 6th - Visual fields full to confrontation, extraocular muscles intact, no nystagmus 5th - facial sensation symmetric 7th - facial strength symmetric 8th - hearing intact 9th - palate elevates symmetrically, uvula midline 11th - shoulder shrug symmetric 12th - tongue protrusion midline  MOTOR:  normal bulk and tone, full strength in the BUE, BLE  SENSORY:  normal and symmetric to light touch, positive Tinel and Phalen's signs   COORDINATION:  finger-nose-finger, fine finger movements normal  GAIT/STATION:  normal   DIAGNOSTIC DATA (LABS, IMAGING, TESTING) - I reviewed patient records, labs, notes, testing and imaging myself where available.  Lab Results  Component Value Date   WBC 3.8 (L) 02/07/2023   HGB 12.3 02/07/2023   HCT 38.4 02/07/2023   MCV 76.0 (L) 02/07/2023   PLT 319 02/07/2023      Component Value Date/Time   NA 143 02/07/2023 1704   NA 144 01/11/2023 1107   NA 139 04/10/2014 1601   K 3.8 02/07/2023 1704   K 3.8 04/10/2014 1601   CL 108 02/07/2023 1704   CL 108 (H) 04/10/2014 1601   CO2 26 02/07/2023 1704   CO2 22 04/10/2014 1601   GLUCOSE 88 02/07/2023 1704   GLUCOSE 93 04/10/2014 1601   BUN 8 02/07/2023 1704   BUN 10 01/11/2023 1107   BUN 7 04/10/2014 1601   CREATININE 0.95 02/07/2023 1704   CREATININE 0.87 04/10/2014 1601   CALCIUM 10.1 02/07/2023 1704   CALCIUM 8.8 04/10/2014 1601   PROT 8.2 (H) 08/01/2021 1527   PROT 6.5 12/10/2018 1640  PROT 8.3 (H) 04/10/2014 1601   ALBUMIN 4.2 08/01/2021 1527   ALBUMIN 4.0 12/10/2018 1640    ALBUMIN 3.9 04/10/2014 1601   AST 21 08/01/2021 1527   AST 39 (H) 04/10/2014 1601   ALT 15 08/01/2021 1527   ALT 15 04/10/2014 1601   ALKPHOS 73 08/01/2021 1527   ALKPHOS 48 04/10/2014 1601   BILITOT 0.4 08/01/2021 1527   BILITOT 0.9 12/10/2018 1640   BILITOT 0.3 04/10/2014 1601   GFRNONAA >60 02/07/2023 1704   GFRNONAA >60 04/10/2014 1601   GFRAA >60 12/16/2018 1442   GFRAA >60 04/10/2014 1601   Lab Results  Component Value Date   CHOL 216 (H) 01/26/2021   HDL 74 01/26/2021   LDLCALC 133 (H) 01/26/2021   TRIG 50 01/26/2021   CHOLHDL 2.9 01/26/2021   Lab Results  Component Value Date   HGBA1C 5.8 (H) 09/23/2020   Lab Results  Component Value Date   VITAMINB12 438 02/11/2021   Lab Results  Component Value Date   TSH 1.600 12/10/2018     ASSESSMENT AND PLAN  53 y.o. year old female with hypertension, hyperlipidemia, anxiety/depression who is presenting with bilateral hands numbness and tingling, finger locking in the morning.  On exam she does have positive Phalen's and Tinel signs consistent with carpal tunnel syndrome.  Plan will be to obtain an EMG nerve conduction study to confirm diagnosis, and if positive will send patient to a hand surgeon.  Advised her to use wrist braces at night.  I will see her in 6 months for follow-up.   1. Bilateral carpal tunnel syndrome      Patient Instructions  EMG Nerve conduction study, if positive will likely send patient to hand surgeon   Use wrist brace a night  Return in 6 months or sooner if worse   Orders Placed This Encounter  Procedures   NCV with EMG(electromyography)    No orders of the defined types were placed in this encounter.   Return in about 6 months (around 11/19/2023).    Windell Norfolk, MD 05/22/2023, 9:00 AM  [facility] [address], [address] [place], [place] [address] [phone]

## 2023-05-22 NOTE — Patient Instructions (Signed)
EMG Nerve conduction study, if positive will likely send patient to hand surgeon   Use wrist brace a night  Return in 6 months or sooner if worse

## 2023-05-24 ENCOUNTER — Other Ambulatory Visit (HOSPITAL_BASED_OUTPATIENT_CLINIC_OR_DEPARTMENT_OTHER): Payer: Self-pay

## 2023-06-13 ENCOUNTER — Other Ambulatory Visit (HOSPITAL_BASED_OUTPATIENT_CLINIC_OR_DEPARTMENT_OTHER): Payer: Self-pay

## 2023-06-27 ENCOUNTER — Ambulatory Visit: Payer: MEDICAID | Admitting: Neurology

## 2023-06-27 ENCOUNTER — Encounter: Payer: Self-pay | Admitting: Neurology

## 2023-06-27 DIAGNOSIS — G5603 Carpal tunnel syndrome, bilateral upper limbs: Secondary | ICD-10-CM | POA: Diagnosis not present

## 2023-06-27 NOTE — Progress Notes (Signed)
 Chief Complaint  Patient presents with   NERVE CONDUCTION STUDY    Emg rm4, alone pt is well and ready for NERVE CONDUCTION STUDY       ASSESSMENT AND PLAN  Dana Dunlap is a 53 y.o. female   Mild bilateral tunnel syndrome,  EMG nerve conduction study showed only mild bilateral carpal tunnel syndromes, no evidence of axonal loss.  She was seen by orthopedic surgeon, I have suggested conservative treatment, wrist splint, as needed NSAIDs  DIAGNOSTIC DATA (LABS, IMAGING, TESTING) - I reviewed patient records, labs, notes, testing and imaging myself where available.   MEDICAL HISTORY:  Dana Dunlap is a 53 year old female, seen in request by Dr. Teresa Coombs for electrodiagnostic study for evaluation of bilateral upper extremity paresthesia, her primary care is from [facility] [place], [place]    History is obtained from the patient and review of electronic medical records. I personally reviewed pertinent available imaging films in PACS.   PMHx of  Left knee replacement in 2021,  HTN HLD Chronic insomnia Anxiety   She has been unemployed since her left knee surgery in 2021, complains of bilateral hands numbness tingling, wake her up at nighttime, difficulty using her hand such as unscrew a bottle top, but denied persistent sensory or motor deficit,  She does suffer significant depression anxiety, chronic insomnia, diffuse body achy pain  Electrodiagnostic study today showed mild bilateral carpal tunnel syndrome, there was no evidence of axonal loss   PHYSICAL EXAM:   Vitals:   06/27/23 1147  BP: 136/78  Pulse: 73  Weight: 182 lb 15.7 oz (83 kg)  Height: 5' (1.524 m)    Body mass index is 35.74 kg/m.  PHYSICAL EXAMNIATION:  Gen: NAD, conversant, well nourised, well groomed                     Cardiovascular: Regular rate rhythm, no peripheral edema, warm, nontender. Eyes: Conjunctivae clear without exudates or hemorrhage Neck: Supple, no carotid  bruits. Pulmonary: Clear to auscultation bilaterally   NEUROLOGICAL EXAM:  MENTAL STATUS: Speech/cognition: Awake, alert, oriented to history taking and casual conversation CRANIAL NERVES: CN II: Visual fields are full to confrontation. Pupils are round equal and briskly reactive to light. CN III, IV, VI: extraocular movement are normal. No ptosis. CN V: Facial sensation is intact to light touch CN VII: Face is symmetric with normal eye closure  CN VIII: Hearing is normal to causal conversation. CN IX, X: Phonation is normal. CN XI: Head turning and shoulder shrug are intact  MOTOR: There is no pronator drift of out-stretched arms. Muscle bulk and tone are normal. Muscle strength is normal.  REFLEXES: Reflexes are 2+ and symmetric at the biceps, triceps, knees, and ankles. Plantar responses are flexor.  SENSORY: Intact to light touch, pinprick and vibratory sensation are intact in fingers and toes.  COORDINATION: There is no trunk or limb dysmetria noted.  GAIT/STANCE: Posture is normal. Gait is steady with normal steps, base, arm swing, and turning. Heel and toe walking are normal. Tandem gait is normal.  Romberg is absent.  REVIEW OF SYSTEMS:  Full 14 system review of systems performed and notable only for as above All other review of systems were negative.   ALLERGIES: Allergies  Allergen Reactions   Prochlorperazine Anaphylaxis and Other (See Comments)    Seizure Seizure Other reaction(s): Other (See Comments), Other (See Comments), Other (See Comments) Seizure Seizure Seizure Seizure AKA Prozac  Other reaction(s):  Seizure Seizure  Tramadol Hives   Compazine [Prochlorperazine Edisylate] Other (See Comments)    Seizure    Other     Sneezing and red eyes    HOME MEDICATIONS: Current Outpatient Medications  Medication Sig Dispense Refill   acetaminophen (TYLENOL) 325 MG tablet Take 650 mg by mouth every 6 (six) hours as needed for mild pain (pain  score 1-3). Patient taking twice weekly     amlodipine-olmesartan (AZOR) 10-20 MG tablet Take 1 tablet by mouth daily. 90 tablet 2   atorvastatin (LIPITOR) 10 MG tablet Take 1 tablet (10 mg total) by mouth daily. 90 tablet 3   Blood Pressure Monitoring (BLOOD PRESSURE KIT) DEVI 1 kit by Does not apply route daily. 1 each 0   cycloSPORINE (RESTASIS) 0.05 % ophthalmic emulsion Place 1 drop into both eyes 2 (two) times daily. 120 each 4   DULoxetine (CYMBALTA) 30 MG capsule Take 1 capsule (30 mg total) by mouth 3 (three) times daily. 180 capsule 0   fluticasone (FLONASE) 50 MCG/ACT nasal spray PLACE 1 SPRAY INTO BOTH NOSTRILS ONCE DAILY 16 g 2   hydrochlorothiazide (HYDRODIURIL) 25 MG tablet Take 12.5 mg by mouth daily.     hydrOXYzine (ATARAX) 25 MG tablet Take 1 tablet (25 mg total) by mouth 2 (two) times daily as needed. (Patient taking differently: Take 25 mg by mouth in the morning and at bedtime.) 60 tablet 3   oxyCODONE-acetaminophen (PERCOCET/ROXICET) 5-325 MG tablet Take 1 tablet by mouth every 6 (six) hours as needed for severe pain (pain score 7-10). 10 tablet 0   prednisoLONE acetate (PRED FORTE) 1 % ophthalmic suspension Place 1 drop into both eyes 3 (three) times daily for 5 days,then twice daily into both eyes for 5 days 5 mL 0   sodium chloride (OCEAN) 0.65 % SOLN nasal spray Place 1 spray into both nostrils as needed for congestion. 30 mL 0   traZODone (DESYREL) 50 MG tablet Take 1 tablet (50 mg total) by mouth once nightly at bedtime as needed. 30 tablet 1   No current facility-administered medications for this visit.    PAST MEDICAL HISTORY: Past Medical History:  Diagnosis Date   Anemia    Depression    Currently on Cymbalta   Eye abnormalities    Fibroids    Frequent headaches    Worse during monthly menstrual cycle   Hypertension     PAST SURGICAL HISTORY: Past Surgical History:  Procedure Laterality Date   BREAST MASS EXCISION     benign   KNEE SURGERY       FAMILY HISTORY: Family History  Problem Relation Age of Onset   Hypertension Mother    Hyperlipidemia Mother    Stroke Mother    Diabetes Father    Cancer Father        Prostate cancer   Hypertension Father    Hyperlipidemia Father    Coronary artery disease Father    Fibroids Sister    Sickle cell trait Sister    Fibroids Sister    Sickle cell trait Brother    Stroke Paternal Aunt    Diabetes Paternal Aunt    Sickle cell trait Other    Thalassemia Other     SOCIAL HISTORY: Social History   Socioeconomic History   Marital status: Single    Spouse name: Not on file   Number of children: 0   Years of education: 16   Highest education level: Bachelor's degree (e.g., BA, AB, BS)  Occupational History  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 She attended [employer] in [place], [place] where she obtained her Scientist, water quality in Biology in 1995. 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 She is in a long term relationship with her boyfried, [name]. They have been together for 6 years. [name] transferred to [employer] [employer] with [place] in January. She enjoys reading and she enjoys the outdoors. She loves to travel. 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   Social Drivers of Corporate investment banker Strain: High Risk (08/31/2022)   Overall Financial Resource Strain (CARDIA)    Difficulty of Paying Living Expenses: Hard  Food Insecurity: No  Food Insecurity (08/31/2022)   Hunger Vital Sign    Worried About Running Out of Food in the Last Year: Never true    Ran Out of Food in the Last Year: Never true  Transportation Needs: No Transportation Needs (08/31/2022)   PRAPARE - Administrator, Civil Service (Medical): No    Lack of Transportation (Non-Medical): No  Physical Activity: Insufficiently Active (06/21/2022)   Exercise Vital Sign    Days of Exercise per Week: 1 day    Minutes of Exercise per Session: 30 min  Stress: Stress Concern Present (08/31/2022)   [employer] of Occupational Health - Occupational Stress Questionnaire    Feeling of Stress : Very much  Social Connections: Socially Isolated (05/28/2017)   Social Connection and Isolation Panel [NHANES]    Frequency of Communication with Friends and Family: Once a week    Frequency of Social Gatherings with Friends and Family: Never    Attends Religious Services: Never    Database administrator or Organizations: No    Attends Banker Meetings: Never    Marital Status: Never married  Intimate Partner Violence: Not At Risk (04/25/2022)   Received from [facility], [facility]   Humiliation, Afraid, Rape, and Kick questionnaire    Fear of Current or Ex-Partner: No    Emotionally Abused: No    Physically Abused: No    Sexually Abused: No      Levert Feinstein, M.D. Ph.D.  [facility] [address] [place], [place] [address] Ph: [phone] Fax: [phone]  CC:  Nolberto Hanlon, PA-C [address] [facility] [address] [place],  [place] [address]  Nolberto Hanlon, [place]

## 2023-06-27 NOTE — Procedures (Signed)
 Full Name: Dana Dunlap Gender: Female MRN #: [id number] Date of Birth: 31-Dec-1970    Visit Date: 06/27/2023 12:04 Age: 53 Years Examining Physician: Levert Feinstein Referring Physician: Levert Feinstein Height: 5 feet 0 inch History: 53 year old right-handed female complains couple years history of intermittent upper extremity paresthesia,  Summary of the test:  Nerve conduction studies: Bilateral ulnar sensory and motor responses were normal.  Bilateral median sensory responses showed borderline or mildly prolonged peak latency with well-preserved snap amplitude.  Bilateral median motor responses were within normal limits  Electromyography: Selected needle examinations  of bilateral upper extremity muscles and cervical paraspinal muscles were normal.  Conclusion: This is a mild abnormal study.  There is electrodiagnostic evidence of mild bilateral median neuropathy across the wrist consistent with mild bilateral carpal tunnel syndromes.    Levert Feinstein. M.D. Ph.D.   [facility] [address] [place], [place] [address] Tel: [phone] Fax: [phone]  Verbal informed consent was obtained from the patient, patient was informed of potential risk of procedure, including bruising, bleeding, hematoma formation, infection, muscle weakness, muscle pain, numbness, among others.        MNC    Nerve / Sites Muscle Latency Ref. Amplitude Ref. Rel Amp Segments Distance Velocity Ref. Area    ms ms mV mV %  cm m/s m/s mVms  R Median - APB     Wrist APB 4.1 <=4.4 7.8 >=4.0 100 Wrist - APB 7   29.3     Upper arm APB 7.7  7.8  101 Upper arm - Wrist 20 56 >=49 29.6  L Median - APB     Wrist APB 3.8 <=4.4 7.2 >=4.0 100 Wrist - APB 7   21.2     Upper arm APB 7.6  7.6  106 Upper arm - Wrist   >=49 28.6  R Ulnar - ADM     Wrist ADM 3.1 <=3.3 6.5 >=6.0 100 Wrist - ADM 7   16.0     B.Elbow ADM 5.6  6.3  97.4 B.Elbow - Wrist 13 52 >=49 16.3     A.Elbow ADM 8.3  5.7   90.7 A.Elbow - B.Elbow 14 53 >=49 15.5  L Ulnar - ADM     Wrist ADM 2.6 <=3.3 7.0 >=6.0 100 Wrist - ADM 7   25.9     B.Elbow ADM 4.8  7.1  101 B.Elbow - Wrist 13 59 >=49 26.3     A.Elbow ADM 7.3  6.2  87.1 A.Elbow - B.Elbow 14 56 >=49 25.2             SNC    Nerve / Sites Rec. Site Peak Lat Ref.  Amp Ref. Segments Distance    ms ms V V  cm  R Median - Orthodromic (Dig II, Mid palm)     Dig II Wrist 3.4 <=3.4 16 >=10 Dig II - Wrist 13  L Median - Orthodromic (Dig II, Mid palm)     Dig II Wrist 3.6 <=3.4 15 >=10 Dig II - Wrist 13  R Ulnar - Orthodromic, (Dig V, Mid palm)     Dig V Wrist 2.9 <=3.1 16 >=5 Dig V - Wrist 11  L Ulnar - Orthodromic, (Dig V, Mid palm)     Dig V Wrist 3.0 <=3.1 9 >=5 Dig V - Wrist 28             F  Wave    Nerve F Lat Ref.  ms ms  R Ulnar - ADM 28.3 <=32.0  L Ulnar - ADM 27.0 <=32.0         EMG Summary Table    Spontaneous MUAP Recruitment  Muscle IA Fib PSW Fasc Other Amp Dur. Poly Pattern  R. Abductor pollicis brevis Normal None None None _______ Normal Normal Normal Normal  R. First dorsal interosseous Normal None None None _______ Normal Normal Normal Normal  R. Pronator teres Normal None None None _______ Normal Normal Normal Normal  R. Biceps brachii Normal None None None _______ Normal Normal Normal Normal  R. Deltoid Normal None None None _______ Normal Normal Normal Normal  R. Triceps brachii Normal None None None _______ Normal Normal Normal Normal  R. Cervical paraspinals Normal None None None _______ Normal Normal Normal Normal  R. Brachioradialis Normal None None None _______ Normal Normal Normal Normal  L. Pronator teres Normal None None None _______ Normal Normal Normal Normal  L. Brachioradialis Normal None None None _______ Normal Normal Normal Normal  L. First dorsal interosseous Normal None None None _______ Normal Normal Normal Normal  L. Abductor pollicis brevis Normal None None None _______ Normal Normal Normal Normal  L.  Deltoid Normal None None None _______ Normal Normal Normal Normal  L. Biceps brachii Normal None None None _______ Normal Normal Normal Normal  L. Cervical paraspinals Normal None None None _______ Normal Normal Normal Normal

## 2023-06-29 ENCOUNTER — Encounter: Payer: Self-pay | Admitting: Neurology

## 2023-07-02 ENCOUNTER — Encounter: Payer: Self-pay | Admitting: Oncology

## 2023-07-30 ENCOUNTER — Other Ambulatory Visit (HOSPITAL_BASED_OUTPATIENT_CLINIC_OR_DEPARTMENT_OTHER): Payer: Self-pay

## 2023-07-31 ENCOUNTER — Other Ambulatory Visit: Payer: Self-pay

## 2023-07-31 ENCOUNTER — Other Ambulatory Visit (HOSPITAL_BASED_OUTPATIENT_CLINIC_OR_DEPARTMENT_OTHER): Payer: Self-pay

## 2023-07-31 MED ORDER — AZELASTINE HCL 0.1 % NA SOLN
1.0000 | Freq: Two times a day (BID) | NASAL | 0 refills | Status: DC | PRN
  Filled 2023-07-31: qty 30, 100d supply, fill #0
  Filled 2023-07-31: qty 30, 50d supply, fill #0

## 2023-07-31 MED ORDER — IBUPROFEN 400 MG PO TABS
400.0000 mg | ORAL_TABLET | Freq: Three times a day (TID) | ORAL | 0 refills | Status: AC | PRN
  Filled 2023-07-31 (×2): qty 15, 5d supply, fill #0

## 2023-07-31 MED ORDER — PREDNISONE 20 MG PO TABS
ORAL_TABLET | ORAL | 0 refills | Status: AC
  Filled 2023-07-31 (×2): qty 7, 6d supply, fill #0

## 2023-08-01 ENCOUNTER — Other Ambulatory Visit: Payer: Self-pay

## 2023-08-02 ENCOUNTER — Other Ambulatory Visit (HOSPITAL_BASED_OUTPATIENT_CLINIC_OR_DEPARTMENT_OTHER): Payer: Self-pay

## 2023-08-02 MED ORDER — HYDROXYZINE HCL 25 MG PO TABS
25.0000 mg | ORAL_TABLET | Freq: Two times a day (BID) | ORAL | 3 refills | Status: DC | PRN
  Filled 2023-08-02: qty 60, 30d supply, fill #0
  Filled 2023-09-21 – 2023-11-28 (×2): qty 60, 30d supply, fill #1
  Filled 2024-01-10: qty 60, 30d supply, fill #2

## 2023-08-04 ENCOUNTER — Emergency Department (HOSPITAL_COMMUNITY)
Admission: EM | Admit: 2023-08-04 | Discharge: 2023-08-04 | Disposition: A | Discharge status: Home / Self Care | Payer: MEDICAID | Attending: Emergency Medicine | Admitting: Emergency Medicine

## 2023-08-04 ENCOUNTER — Other Ambulatory Visit (HOSPITAL_BASED_OUTPATIENT_CLINIC_OR_DEPARTMENT_OTHER): Payer: Self-pay

## 2023-08-04 ENCOUNTER — Encounter (HOSPITAL_COMMUNITY): Payer: Self-pay

## 2023-08-04 DIAGNOSIS — L0100 Impetigo, unspecified: Secondary | ICD-10-CM

## 2023-08-04 DIAGNOSIS — Z79899 Other long term (current) drug therapy: Secondary | ICD-10-CM | POA: Insufficient documentation

## 2023-08-04 DIAGNOSIS — I10 Essential (primary) hypertension: Secondary | ICD-10-CM | POA: Insufficient documentation

## 2023-08-04 DIAGNOSIS — J0110 Acute frontal sinusitis, unspecified: Secondary | ICD-10-CM | POA: Diagnosis not present

## 2023-08-04 DIAGNOSIS — R0981 Nasal congestion: Secondary | ICD-10-CM | POA: Diagnosis present

## 2023-08-04 LAB — GROUP A STREP BY PCR: Group A Strep by PCR: NOT DETECTED

## 2023-08-04 LAB — SARS CORONAVIRUS 2 BY RT PCR: SARS Coronavirus 2 by RT PCR: NEGATIVE

## 2023-08-04 MED ORDER — MUPIROCIN CALCIUM 2 % EX CREA
TOPICAL_CREAM | Freq: Two times a day (BID) | CUTANEOUS | Status: DC
  Filled 2023-08-04: qty 15

## 2023-08-04 MED ORDER — AMOXICILLIN-POT CLAVULANATE 875-125 MG PO TABS
1.0000 | ORAL_TABLET | Freq: Two times a day (BID) | ORAL | 0 refills | Status: DC
  Filled 2023-08-04: qty 14, 7d supply, fill #0

## 2023-08-04 NOTE — ED Triage Notes (Signed)
 Pt. Coming from home with pain and aching in the ears, sores in the left side of the nose, along with a sore throat. Pt is having night sweats. Temperature is 97.8 in triage.  Pt on steroids currently and a nasal spray, states they are not helping and her health is declining. States a reduction in energy and unable to get out of bed for few days.

## 2023-08-04 NOTE — ED Provider Notes (Signed)
 [place] EMERGENCY DEPARTMENT AT [facility] Provider Note   CSN: [id number] Arrival date & time: 08/04/23  0941     History  Chief Complaint  Patient presents with   Nasal Congestion   Sore Throat    Unable to swallow properly    Otalgia    Unable to swallow, sore throat. Has sores around and inside the left side, along with pain and aching in both of the ears.     Dana Dunlap is a 53 y.o. female.  Patient with past medical history of iron  deficiency anemia, hyperlipidemia, hypertension, abnormality presented to emergency room with complaint of URI-like symptoms.  She reports she has head congestion, sinus pressure, dry itchy eyes and now has rash to left nare.  She has been taking prednisone  for the past 4 days still has 1 dose left.  Also been using intranasal antihistamine and ibuprofen .  She is tolerating oral intake.  She has normal phonation and occultly handling secretions.  No fever.  No shortness of breath chest pain cough at abdominal pain nausea vomiting or diarrhea.   Sore Throat  Otalgia Associated symptoms: rash        Home Medications Prior to Admission medications   Medication Sig Start Date End Date Taking? Authorizing Provider  acetaminophen  (TYLENOL ) 325 MG tablet Take 650 mg by mouth every 6 (six) hours as needed for mild pain (pain score 1-3). Patient taking twice weekly    [provider]  amlodipine -olmesartan  (AZOR ) 10-20 MG tablet Take 1 tablet by mouth daily. 10/17/22   Clearnce Curia, NP  atorvastatin  (LIPITOR) 10 MG tablet Take 1 tablet (10 mg total) by mouth daily. 11/30/22   Maudine Sos, MD  azelastine  (ASTELIN ) 0.1 % nasal spray Place 1 spray into both nostrils 2 (two) times daily as needed. 07/31/23     Blood Pressure Monitoring (BLOOD PRESSURE KIT) DEVI 1 kit by Does not apply route daily. 03/08/21   Iloabachie, Chioma E, NP  cycloSPORINE  (RESTASIS ) 0.05 % ophthalmic emulsion Place 1 drop into both eyes 2 (two)  times daily. 05/21/23     DULoxetine  (CYMBALTA ) 30 MG capsule Take 1 capsule (30 mg total) by mouth 3 (three) times daily. 02/23/23     fluticasone  (FLONASE ) 50 MCG/ACT nasal spray PLACE 1 SPRAY INTO BOTH NOSTRILS ONCE DAILY 12/07/20 06/26/24  Iloabachie, Chioma E, NP  hydrochlorothiazide  (HYDRODIURIL ) 25 MG tablet Take 12.5 mg by mouth daily.    [provider]  hydrOXYzine  (ATARAX ) 25 MG tablet Take 1 tablet (25 mg total) by mouth 2 (two) times daily as needed. 08/02/23     ibuprofen  (ADVIL ) 400 MG tablet Take 1 tablet (400 mg total) by mouth 3 (three) times daily as needed. 07/31/23     oxyCODONE -acetaminophen  (PERCOCET/ROXICET) 5-325 MG tablet Take 1 tablet by mouth every 6 (six) hours as needed for severe pain (pain score 7-10). 02/21/23   Horton, Sidra Dredge, DO  prednisoLONE  acetate (PRED FORTE ) 1 % ophthalmic suspension Place 1 drop into both eyes 3 (three) times daily for 5 days,then twice daily into both eyes for 5 days 01/10/23     predniSONE  (DELTASONE ) 20 MG tablet Take 2 tablets (40 mg total) by mouth daily for 2 days, THEN 1 tablet (20 mg total) daily for 2 days, THEN 0.5 tablets (10 mg total) daily for 2 days. 07/31/23 08/06/23    sodium chloride  (OCEAN) 0.65 % SOLN nasal spray Place 1 spray into both nostrils as needed for congestion. 12/07/20  Iloabachie, Chioma E, NP  traZODone  (DESYREL ) 50 MG tablet Take 1 tablet (50 mg total) by mouth once nightly at bedtime as needed. 08/09/21         Allergies    Prochlorperazine, Tramadol , Compazine [prochlorperazine edisylate], and Other    Review of Systems   Review of Systems  HENT:  Positive for ear pain.   Skin:  Positive for rash.    Physical Exam Updated Vital Signs BP (!) 179/110 (BP Location: Left Arm)   Pulse 83   Temp 97.8 F (36.6 C) (Oral)   Resp 15   Ht 5' (1.524 m)   Wt 81.6 kg   LMP 07/30/2021 (Approximate)   SpO2 100%   BMI 35.15 kg/m  Physical Exam Vitals and nursing note reviewed.  Constitutional:       General: She is not in acute distress.    Appearance: She is not toxic-appearing.  HENT:     Head: Normocephalic and atraumatic.     Nose: Congestion present.     Comments: TTP over maxillary sinuses    Mouth/Throat:     Pharynx: Posterior oropharyngeal erythema present. No oropharyngeal exudate.  Eyes:     General: No scleral icterus.    Conjunctiva/sclera: Conjunctivae normal.  Cardiovascular:     Rate and Rhythm: Normal rate and regular rhythm.     Pulses: Normal pulses.     Heart sounds: Normal heart sounds.  Pulmonary:     Effort: Pulmonary effort is normal. No respiratory distress.     Breath sounds: Normal breath sounds.     Comments: Lung CTAB, no distress Abdominal:     General: Abdomen is flat. Bowel sounds are normal.     Palpations: Abdomen is soft.     Tenderness: There is no abdominal tenderness.  Skin:    General: Skin is warm and dry.     Findings: No lesion.     Comments: Small crusting erythematous rash to external and internal left nostril   Neurological:     General: No focal deficit present.     Mental Status: She is alert and oriented to person, place, and time. Mental status is at baseline.     ED Results / Procedures / Treatments   Labs (all labs ordered are listed, but only abnormal results are displayed) Labs Reviewed  GROUP A STREP BY PCR  SARS CORONAVIRUS 2 BY RT PCR    EKG None  Radiology No results found.  Procedures Procedures    Medications Ordered in ED Medications  mupirocin  cream (BACTROBAN ) 2 % (has no administration in time range)    ED Course/ Medical Decision Making/ A&P                                 Medical Decision Making Risk Prescription drug management.   Dana Dunlap 53 y.o. presented today for URI like symptoms. Working DDx that I considered at this time includes, but not limited to, viral illness, pharyngitis, mono, sinusitis, electrolyte abnormality, AOM.  R/o DDx: these additional diagnoses are  not consistent with patient's history, presentation, physical exam, labs/imaging findings.   Labs:  Respiratory Panel: pending Group A Strep: negative   Problem List / ED Course / Critical interventions / Medication management  Patient reporting with URI-like symptoms and congestion and left sided sinus pressure for 10 days.  Has not had any associated fever but symptoms seem to be getting worse has  been trying over-the-counter management antihistamines ibuprofen .  On my exam she does have sinus pressure over the maxillary sinuses bilaterally.  She also has rash in the left nare, no significant swelling or surrounding erythema.  Lungs clear to auscultation bilaterally.  She is hemodynamically stable and well-appearing she is not hypoxic.  Do not feel she needs checks x-ray as she is not having cough.  She is handling secretions and overall feels very well.  Given duration we will treat sinus infection with antibiotic.  Will also give mupirocin  for rash in nose.  Given return precautions.  Feel she is appropriate for discharge with outpatient follow-up. I ordered medication including mupirocin   Reevaluation of the patient after these medicines showed that the patient improved Patients vitals assessed. Upon arrival patient is hemodynamically stable.  I have reviewed the patients home medicines and have made adjustments as needed     Plan:  F/u w/ PCP in 2-3d to ensure resolution of sx.  Patient was given return precautions. Patient stable for discharge at this time.  Patient educated on sx and dx and verbalized understanding of plan. Return to ER if new or worsening sx.          Final Clinical Impression(s) / ED Diagnoses Final diagnoses:  Impetigo  Acute non-recurrent frontal sinusitis    Rx / DC Orders ED Discharge Orders          Ordered    amoxicillin -clavulanate (AUGMENTIN ) 875-125 MG tablet  Every 12 hours        08/04/23 1056              Nygel Prokop, Kandace Organ,  PA-C 08/04/23 1102    Lind Repine, MD 08/05/23 [phone]

## 2023-08-04 NOTE — Discharge Instructions (Addendum)
 These follow-up with your primary care doctor.  Given how long you have had sinus pressure we will start you on an antibiotic called Augmentin  please take as prescribed.  You can continue taking your steroids.  You can take Tylenol  and ibuprofen  for fever or muscle aches.  Make sure staying well-hydrated with primarily water you can alternate Gatorade or Pedialyte drink.  Use mupirocin  to rash and into nose for next 7 to 10 days 1-2 times daily.

## 2023-08-10 ENCOUNTER — Other Ambulatory Visit (HOSPITAL_BASED_OUTPATIENT_CLINIC_OR_DEPARTMENT_OTHER): Payer: Self-pay

## 2023-09-04 ENCOUNTER — Other Ambulatory Visit (HOSPITAL_BASED_OUTPATIENT_CLINIC_OR_DEPARTMENT_OTHER): Payer: Self-pay

## 2023-09-04 ENCOUNTER — Other Ambulatory Visit: Payer: Self-pay | Admitting: Neurology

## 2023-09-04 ENCOUNTER — Telehealth: Payer: Self-pay | Admitting: Neurology

## 2023-09-04 ENCOUNTER — Ambulatory Visit (HOSPITAL_BASED_OUTPATIENT_CLINIC_OR_DEPARTMENT_OTHER): Payer: MEDICAID | Admitting: Cardiovascular Disease

## 2023-09-04 ENCOUNTER — Encounter (HOSPITAL_BASED_OUTPATIENT_CLINIC_OR_DEPARTMENT_OTHER): Payer: Self-pay | Admitting: Cardiovascular Disease

## 2023-09-04 VITALS — BP 132/78 | HR 79 | Ht 60.0 in | Wt 188.0 lb

## 2023-09-04 DIAGNOSIS — E785 Hyperlipidemia, unspecified: Secondary | ICD-10-CM | POA: Diagnosis not present

## 2023-09-04 DIAGNOSIS — I10 Essential (primary) hypertension: Secondary | ICD-10-CM

## 2023-09-04 DIAGNOSIS — G5603 Carpal tunnel syndrome, bilateral upper limbs: Secondary | ICD-10-CM

## 2023-09-04 MED ORDER — GABAPENTIN 300 MG PO CAPS
300.0000 mg | ORAL_CAPSULE | Freq: Two times a day (BID) | ORAL | 0 refills | Status: DC
  Filled 2023-09-04: qty 60, 30d supply, fill #0

## 2023-09-04 NOTE — Progress Notes (Signed)
 [facility] Follow-up:    Date:  09/04/2023   ID:  Dana Dunlap, DOB 1970-12-24, MRN [id number]  PCP:  Leonetta Rama, PA-C  Cardiologist:  None   Referring MD: Leonetta Rama,*   CC: Hypertension  History of Present Illness:    Dana Dunlap is a 53 y.o. female with a hx of hypertension, hyperlipidemia, prediabetes, anemia, PTSD, depression, alpha thalassemia silent carrier, here for follow-up. She was initially seen 06/21/2022 in the [facility]. She was seen by Achille Hole, PA 05/02/2022 and her blood pressure was uncontrolled on amlodipine  5 mg. Her amlodipine  was increased to 10 mg and she requested a referral to cardiology.  She was also struggling with increased anxiety and depression but planned to resume seeing a therapist.  At her initial visit, she reported home blood pressures previously in the 140s-150s, but improved to 130s systolic after increasing amlodipine  to 10 mg daily. Given a family history of ASCVD in both parents, she was interested in calcium  scoring. Coronary CT 07/2022 revealed coronary calcium  score of 0 without evidence of heart disease. LP(a) 07/2022 was elevated to 133.5. She followed up with Neomi Banks, NP 08/2022 and continued to struggle with persistent stress which she felt was contributory to her hypertension. Home readings were consistently above goal. Amlodipine  was stopped and switched to amlodipine -olmesartan  10-20 mg daily. She had an echo 11/2022 showing LVEF 60-65% with normal diastolic parameters, degenerative mitral valve without regurgitation.  At her visit 11/2022 she was very stressed.  Home blood pressure was averaging in the 130s to 160s.  It was 148/95 in the office.  She was trying to increase her exercise.  Hydrochlorothiazide  was added to her regimen and she was enrolled in the remote patient monitoring study.  She followed up with Neomi Banks, NP and blood pressures were low so HCTZ  was reduced to 12.5 mg.  At her visit 03/2023 her BP was well-controlled.  She struggled with ongoing symptoms after a fall.  She saw neurology and was diagnosed with mild bilateral carpel tunnel.    Discussed the use of AI scribe software for clinical note transcription with the patient, who gave verbal consent to proceed.  History of Present Illness Ms. Dana Dunlap experiences persistent sharp and stabbing pain in her elbow, which she has been told is related to carpal tunnel syndrome in both hands. The pain episodes linger before subsiding and have recently returned, causing significant discomfort and emotional distress. She uses a wrist brace at night, but the pain persists in her elbow, not just her wrist. She takes ibuprofen  400 mg three times a day for pain relief.  She experiences numbness in both hands upon waking, and her fingers sometimes lock in a position described as 'trigger finger,' which she has never experienced before. Despite using a wrist brace at night, the symptoms persist. She has not been using ice for her symptoms.  Her blood pressure is generally well-controlled, with readings around 120/80 mmHg, but it has been elevated due to pain. She is currently taking medication for blood pressure, which she finds effective without side effects.  She has not been engaging in regular physical exercise and attempted push-ups recently, which may have exacerbated her symptoms.  Previous antihypertensives:   Past Medical History:  Diagnosis Date   Anemia    Depression    Currently on Cymbalta    Eye abnormalities    Fibroids    Frequent headaches    Worse during monthly menstrual  cycle   Hypertension     Past Surgical History:  Procedure Laterality Date   BREAST MASS EXCISION     benign   KNEE SURGERY      Current Medications: Current Meds  Medication Sig   acetaminophen  (TYLENOL ) 325 MG tablet Take 650 mg by mouth every 6 (six) hours as needed for mild pain (pain score  1-3). Patient taking twice weekly   amlodipine -olmesartan  (AZOR ) 10-20 MG tablet Take 1 tablet by mouth daily.   atorvastatin  (LIPITOR) 10 MG tablet Take 1 tablet (10 mg total) by mouth daily.   azelastine  (ASTELIN ) 0.1 % nasal spray Place 1 spray into both nostrils 2 (two) times daily as needed.   cycloSPORINE  (RESTASIS ) 0.05 % ophthalmic emulsion Place 1 drop into both eyes 2 (two) times daily.   DULoxetine  (CYMBALTA ) 30 MG capsule Take 1 capsule (30 mg total) by mouth 3 (three) times daily.   fluticasone  (FLONASE ) 50 MCG/ACT nasal spray PLACE 1 SPRAY INTO BOTH NOSTRILS ONCE DAILY   hydrochlorothiazide  (HYDRODIURIL ) 25 MG tablet Take 12.5 mg by mouth daily.   hydrOXYzine  (ATARAX ) 25 MG tablet Take 1 tablet (25 mg total) by mouth 2 (two) times daily as needed.   ibuprofen  (ADVIL ) 400 MG tablet Take 1 tablet (400 mg total) by mouth 3 (three) times daily as needed.   oxyCODONE -acetaminophen  (PERCOCET/ROXICET) 5-325 MG tablet Take 1 tablet by mouth every 6 (six) hours as needed for severe pain (pain score 7-10).   prednisoLONE  acetate (PRED FORTE ) 1 % ophthalmic suspension Place 1 drop into both eyes 3 (three) times daily for 5 days,then twice daily into both eyes for 5 days   sodium chloride  (OCEAN) 0.65 % SOLN nasal spray Place 1 spray into both nostrils as needed for congestion.   traZODone  (DESYREL ) 50 MG tablet Take 1 tablet (50 mg total) by mouth once nightly at bedtime as needed.     Allergies:   Prochlorperazine, Tramadol , Compazine [prochlorperazine edisylate], and Other   Social History   Socioeconomic History   Marital status: Single    Spouse name: Not on file   Number of children: 0   Years of education: 16   Highest education level: Bachelor's degree (e.g., BA, AB, BS)  Occupational History   Occupation: Retail buyer: [employer]    Comment: Molecular biology  Tobacco Use   Smoking status: Never   Smokeless tobacco: Never  Vaping Use   Vaping status: Never Used   Substance and Sexual Activity   Alcohol use: Yes    Alcohol/week: 1.0 standard drink of alcohol    Types: 1 Glasses of wine per week   Drug use: No   Sexual activity: Yes    Birth control/protection: Condom  Other Topics Concern   Not on file  Social History Narrative   Luiza is originally from [place]. She attended [employer] in [place], [place] where she obtained her Scientist, water quality in Biology in 1995. 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 She is in a long term relationship with her boyfried, Lonzo Robinson. They have been together for 6 years. Kristen transferred to [place]  with [employer] in January. She enjoys reading and she enjoys the outdoors. She loves to travel. 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   Social Drivers of Health   Financial Resource Strain: High Risk (08/31/2022)   Overall Financial Resource Strain (CARDIA)    Difficulty of  Paying Living Expenses: Hard  Food Insecurity: No Food Insecurity (08/31/2022)   Hunger Vital Sign    Worried About Running Out of Food in the Last Year: Never true    Ran Out of Food in the Last Year: Never true  Transportation Needs: No Transportation Needs (08/31/2022)   PRAPARE - Administrator, Civil Service (Medical): No    Lack of Transportation (Non-Medical): No  Physical Activity: Insufficiently Active (06/21/2022)   Exercise Vital Sign    Days of Exercise per Week: 1 day    Minutes of Exercise per Session: 30 min  Stress: Stress Concern Present (08/31/2022)   [employer] of Occupational Health - Occupational Stress Questionnaire    Feeling of Stress : Very much  Social Connections: Socially Isolated (05/28/2017)   Social Connection and Isolation Panel [NHANES]    Frequency of Communication with Friends and Family: Once a week    Frequency of Social Gatherings with Friends and Family: Never    Attends Religious Services: Never     Database administrator or Organizations: No    Attends Engineer, structural: Never    Marital Status: Never married     Family History: The patient's family history includes Cancer in her father; Coronary artery disease in her father; Diabetes in her father and paternal aunt; Fibroids in her sister and sister; Hyperlipidemia in her father and mother; Hypertension in her father and mother; Sickle cell trait in her brother, sister, and another family member; Stroke in her mother and paternal aunt; Thalassemia in an other family member.  ROS:   Please see the history of present illness.    (+) Stress (+) Shortness of breath (+) Right knee pain (+) Blurry vision, eye floaters All other systems reviewed and are negative.  EKGs/Labs/Other Studies Reviewed:    Echocardiogram  11/02/2022:  1. Left ventricular ejection fraction, by estimation, is 60 to 65%. The  left ventricle has normal function. The left ventricle has no regional  wall motion abnormalities. Left ventricular diastolic parameters were  normal. The average left ventricular  global longitudinal strain is -20.0 %. The global longitudinal strain is  normal.   2. Right ventricular systolic function is normal. The right ventricular  size is normal.   3. The mitral valve is degenerative. No evidence of mitral valve  regurgitation.   4. The aortic valve is grossly normal. Aortic valve regurgitation is not  visualized.   5. The inferior vena cava is normal in size with greater than 50%  respiratory variability, suggesting right atrial pressure of 3 mmHg.   CT Cardiac Scoring  07/26/2022: IMPRESSION: Coronary calcium  score of 0.   EKG:  EKG is personally reviewed. 11/09/2022:  Sinus rhythm. Rate 66 bpm. 06/21/2022: EKG was not ordered.  Recent Labs: 02/07/2023: BUN 8; Creatinine, Ser 0.95; Hemoglobin 12.3; Platelets 319; Potassium 3.8; Sodium 143   Recent Lipid Panel    Component Value Date/Time   CHOL 216 (H)  01/26/2021 0950   TRIG 50 01/26/2021 0950   HDL 74 01/26/2021 0950   CHOLHDL 2.9 01/26/2021 0950   CHOLHDL 3 03/21/2016 1621   VLDL 10.8 03/21/2016 1621   LDLCALC 133 (H) 01/26/2021 0950    Physical Exam:    VS:  BP 132/78   Pulse 79   Ht 5' (1.524 m)   Wt 188 lb (85.3 kg)   LMP 07/30/2021 (Approximate)   SpO2 91%   BMI 36.72 kg/m  , BMI Body mass index is  36.72 kg/m. GENERAL:  Well appearing HEENT: Pupils equal round and reactive, fundi not visualized, oral mucosa unremarkable NECK:  No jugular venous distention, waveform within normal limits, carotid upstroke brisk and symmetric, no bruits, no thyromegaly LUNGS:  Clear to auscultation bilaterally HEART:  RRR.  PMI not displaced or sustained,S1 and S2 within normal limits, no S3, no S4, no clicks, no rubs, no murmurs ABD:  Flat, positive bowel sounds normal in frequency in pitch, no bruits, no rebound, no guarding, no midline pulsatile mass, no hepatomegaly, no splenomegaly EXT:  2 plus pulses throughout, no edema, no cyanosis no clubbing SKIN:  No rashes no nodules NEURO:  Cranial nerves II through XII grossly intact, motor grossly intact throughout PSYCH:  Cognitively intact, oriented to person place and time   ASSESSMENT/PLAN:    Assessment & Plan # Carpal tunnel syndrome Chronic condition with significant pain affecting quality of life. Nerve conduction studies confirm diagnosis. She is hesitant about surgery due to past experiences and concerns about scarring. - Discuss non-surgical options with surgical team, including physical therapy and alternative treatments. - Consider wrist braces during sleep. - Advise against push-up exercises. - Recommend icing for pain relief. - Alternate ibuprofen  with acetaminophen  to manage pain and minimize blood pressure elevation.  # Hypertension Blood pressure generally well-controlled. Recent elevation likely due to pain and emotional distress. - Continue current antihypertensive  regimen of amlodipine /olmesartan  and hydrochlorothiazide .   # Depression Signs of depression with anxiety due to multiple medical issues. Mood likely affects health management. - Emphasize establishing care with a primary care physician for mental health. - Encourage regular cardiovascular exercise, such as walking.   Screening for Secondary Hypertension:     Relevant Labs/Studies:    Latest Ref Rng & Units 02/07/2023    5:04 PM 01/11/2023   11:07 AM 08/01/2021    3:27 PM  Basic Labs  Sodium 135 - 145 mmol/L 143  144  140   Potassium 3.5 - 5.1 mmol/L 3.8  4.4  3.8   Creatinine 0.44 - 1.00 mg/dL 1.61  0.96  0.45        Latest Ref Rng & Units 12/10/2018    4:40 PM 06/11/2017   10:55 AM  Thyroid    TSH 0.450 - 4.500 uIU/mL 1.600  3.40     Disposition:    FU with APP/PharmD in 1 month.  FU with Toneisha Savary C. Theodis Fiscal, MD, [facility] in 4 months.  Medication Adjustments/Labs and Tests Ordered: Current medicines are reviewed at length with the patient today.  Concerns regarding medicines are outlined above.   No orders of the defined types were placed in this encounter.  No orders of the defined types were placed in this encounter.    Signed, Maudine Sos, MD  09/04/2023 11:16 AM    [facility]

## 2023-09-04 NOTE — Telephone Encounter (Signed)
 Please add her to the cancellation list but next step is referral to surgery.

## 2023-09-04 NOTE — Telephone Encounter (Signed)
 Patient referred to surgery and Gabapentin sent to pharmacy.

## 2023-09-04 NOTE — Telephone Encounter (Signed)
 Call to patient, she reports pain in the right elbow area a stabbing pain that lingers and she has no relief. She reports she is taking 1200 mg daily ibuprofen  with no relief. Bending or straightening her arm gives no relief. She reports the is irritated and in agony because of the pain. She would like a referral sent to surgery so they can start working on it but needs relief in the meantime. Advised I would send to Dr. Samara Crest for review. Patient tearful and agitated on call

## 2023-09-04 NOTE — Telephone Encounter (Signed)
 Patient said having a terrible pain radiating from elbow to hand. Have gone to the ED and prescribed Oxycodone . Urgent Care also prescribe some medication for the pain.Patient stated, pain is so severe makes me cry. Would like a call back to discuss if can be worked in Advertising account executive.

## 2023-09-04 NOTE — Patient Instructions (Addendum)
 Medication Instructions:  Your physician recommends that you continue on your current medications as directed. Please refer to the Current Medication list given to you today.   Labwork: NONE  Testing/Procedures: NONE  Follow-Up: AS NEEDED    TALK WITH YOUR PRIMARY CARE ABOUT TAKING OVER REFILLING YOUR BLOOD PRESSURE MEDICATIONS WHEN YOU NEED REFILLS   Dr. Cleave Curling Dr. Cinda Craze Dr. Concetta Dee

## 2023-09-05 ENCOUNTER — Telehealth: Payer: Self-pay | Admitting: Neurology

## 2023-09-05 NOTE — Telephone Encounter (Signed)
 Call to patient, notified of gabapentin script send and reviewed medications. Also advised referral was made for hand surgery. Patient appreciative of call

## 2023-09-05 NOTE — Telephone Encounter (Signed)
Referral for hand surgery fax to [facility]. Phone: [phone], Fax: [phone].

## 2023-09-06 ENCOUNTER — Other Ambulatory Visit (HOSPITAL_BASED_OUTPATIENT_CLINIC_OR_DEPARTMENT_OTHER): Payer: Self-pay

## 2023-09-11 ENCOUNTER — Ambulatory Visit: Payer: Self-pay

## 2023-09-11 ENCOUNTER — Telehealth: Payer: Self-pay | Admitting: Cardiovascular Disease

## 2023-09-11 NOTE — Telephone Encounter (Signed)
 S/w pt gave pt several options for offices excepting new pt's for PCP care.  Pt stated will work on getting one.

## 2023-09-11 NOTE — Telephone Encounter (Signed)
 FYI Only or Action Required?: FYI only for provider  Patient was last seen in primary care on 02/23/2023 by Mardene Shake, FNP. Called Nurse Triage reporting Hand Pain and Knee Pain. Symptoms began a week ago. Interventions attempted: OTC medications: ibuprofen  and Prescription medications: gabapentin . Symptoms are: gradually worsening.  Triage Disposition: See HCP Within 4 Hours (Or PCP Triage)  Patient/caregiver understands and will follow disposition?: No, refuses disposition   Copied from CRM [id number]. Topic: Clinical - Red Word Triage >> Sep 11, 2023  4:42 PM [place] J wrote: [facility] that prompted transfer to Nurse Triage: Patient is having extreme right hand and knee pain, and would like to transfer care to Dr. Arliss Lam for the follow-up appointment. Reason for Disposition  [1] SEVERE pain (e.g., excruciating, unable to use hand at all) AND [2] not improved after 2 hours of pain medicine  Answer Assessment - Initial Assessment Questions 1. ONSET: "When did the pain start?"     Since 2022, but has worsened over the past week 2. LOCATION: "Where is the pain located?"     Right hand pain 3. PAIN: "How bad is the pain?" (Scale 1-10; or mild, moderate, severe)   - MILD (1-3): doesn't interfere with normal activities   - MODERATE (4-7): interferes with normal activities (e.g., work or school) or awakens from sleep   - SEVERE (8-10): excruciating pain, unable to use hand at all     Pain is severe at time of call, but flares up 4. WORK OR EXERCISE: "Has there been any recent work or exercise that involved this part (i.e., hand or wrist) of the body?"     N/A 5. CAUSE: "What do you think is causing the pain?"     DX with carpal tunel and given brace to wear at first ED visit, Has had CT at subsequent ED visit 6. AGGRAVATING FACTORS: "What makes the pain worse?" (e.g., using computer)     N/A 7. OTHER SYMPTOMS: "Do you have any other symptoms?" (e.g., neck pain, swelling, rash, numbness,  fever)     Radiates from elbow to fingers. She has numbness and tingling in fingers  Reports taking prescribed gabapentin .  Reports that it makes her loopy but does not affect her pain Patient refuses to answer all questions as she is tired of repeating her history.  She has seen several providers including an orthopedic surgeon  Patient only wants to see Dr. Arliss Lam and asked to be put on a wait list for a new patient appointment.  In the interim, she will see Dr. Lincoln Renshaw in [place] She states that she will not go to ED again because they do not address her pain issues  Protocols used: Hand and Wrist Pain-A-AH

## 2023-09-11 NOTE — Telephone Encounter (Signed)
 Patient is calling because Dr. Theodis Fiscal recommended [facility] are not accepting new patient. Patient would like to know if there was anything we could do on our end for her to be seen. Patient mentioned she does need hand surgery (with Dr. Samara Crest), but they will not schedule the surgery until she is seen with her PCP. Patient stated she is in a lot of pain and is really needing help. Please advise.

## 2023-09-12 NOTE — Telephone Encounter (Signed)
 Spoke with patient and let her know to go to the UC or ED for pain, patient has a future appt and is on the waitlist

## 2023-09-13 ENCOUNTER — Telehealth: Payer: Self-pay | Admitting: Neurology

## 2023-09-13 ENCOUNTER — Telehealth: Payer: Self-pay

## 2023-09-13 NOTE — Telephone Encounter (Signed)
 Documented in  another note, see chart

## 2023-09-13 NOTE — Telephone Encounter (Signed)
 Call to patient, she reports that gabapentin  is making her loopy and has not helped her pain. The pain is still in the right elbow shooting into the wrist all of her fingers are numb. She came in to office and stated the front desk was rude to her. I advised I would send to Camara to for review.  A family member told her the gabapentin  dose was too high too. I reassured patient that dose was still a low dose. Patient stopped taking gabapentin  and only taking ibuprofen . Patient appreciative. Will send Dr. Samara Crest for review.

## 2023-09-13 NOTE — Telephone Encounter (Signed)
 Pt is asking for a call from RN to discuss she believes the dose of the  gabapentin  (NEURONTIN ) 300 MG capsule is too high, and that it is not addressing the pain.  Pt is asking for a call to discuss other pain medications.  Pt also relayed that regarding the referral set up for her earlier this month her initial appointment is set for 6-27 with a PA, please call pt at [phone]

## 2023-09-14 NOTE — Telephone Encounter (Signed)
 Pt has called asking it be documented that she has called again asking to speak with Dr Samara Crest. Pt was advised the message from yesterday was sent to Dr Samara Crest from RN.  Pt states she will send my chart message as well.

## 2023-09-14 NOTE — Telephone Encounter (Signed)
 Spoke with patient, advised her to proceed with surgery and to use Gabapentin  300 mg daily instead of the 600.

## 2023-09-17 NOTE — Telephone Encounter (Signed)
 Noted. Updated script sent to pharmacy and advised to remove previous script gabapentin  300 mg BID from profile

## 2023-09-17 NOTE — Addendum Note (Signed)
 Addended by: Genora Kidd on: 09/17/2023 06:40 AM   Modules accepted: Orders

## 2023-09-18 ENCOUNTER — Other Ambulatory Visit (HOSPITAL_BASED_OUTPATIENT_CLINIC_OR_DEPARTMENT_OTHER): Payer: Self-pay

## 2023-09-18 MED ORDER — GABAPENTIN 300 MG PO CAPS
300.0000 mg | ORAL_CAPSULE | Freq: Every day | ORAL | 1 refills | Status: DC
  Filled 2023-09-18 – 2023-11-28 (×3): qty 30, 30d supply, fill #0
  Filled 2024-01-10: qty 30, 30d supply, fill #1

## 2023-09-18 NOTE — Telephone Encounter (Signed)
 Done

## 2023-09-18 NOTE — Addendum Note (Signed)
 Addended byCassandra Cleveland on: 09/18/2023 08:16 AM   Modules accepted: Orders

## 2023-09-21 ENCOUNTER — Other Ambulatory Visit: Payer: Self-pay

## 2023-09-21 ENCOUNTER — Other Ambulatory Visit (HOSPITAL_BASED_OUTPATIENT_CLINIC_OR_DEPARTMENT_OTHER): Payer: Self-pay

## 2023-09-21 MED ORDER — DULOXETINE HCL 30 MG PO CPEP
30.0000 mg | ORAL_CAPSULE | Freq: Three times a day (TID) | ORAL | 0 refills | Status: DC
  Filled 2023-09-21: qty 180, 60d supply, fill #0

## 2023-09-21 MED FILL — Atorvastatin Calcium Tab 10 MG (Base Equivalent): ORAL | 90 days supply | Qty: 90 | Fill #1 | Status: CN

## 2023-09-26 ENCOUNTER — Ambulatory Visit (INDEPENDENT_AMBULATORY_CARE_PROVIDER_SITE_OTHER): Payer: MEDICAID | Admitting: Family Medicine

## 2023-09-26 ENCOUNTER — Encounter: Payer: Self-pay | Admitting: Family Medicine

## 2023-09-26 ENCOUNTER — Other Ambulatory Visit: Payer: Self-pay | Admitting: Family Medicine

## 2023-09-26 VITALS — BP 133/78 | HR 64 | Temp 98.3°F | Resp 18 | Ht 60.0 in | Wt 189.1 lb

## 2023-09-26 DIAGNOSIS — D500 Iron deficiency anemia secondary to blood loss (chronic): Secondary | ICD-10-CM

## 2023-09-26 DIAGNOSIS — Z7689 Persons encountering health services in other specified circumstances: Secondary | ICD-10-CM | POA: Diagnosis not present

## 2023-09-26 DIAGNOSIS — G5603 Carpal tunnel syndrome, bilateral upper limbs: Secondary | ICD-10-CM

## 2023-09-26 DIAGNOSIS — R7302 Impaired glucose tolerance (oral): Secondary | ICD-10-CM | POA: Diagnosis not present

## 2023-09-26 NOTE — Progress Notes (Signed)
 New Patient Office Visit  Subjective    Patient ID: Dana Dunlap, female    DOB: 1970-05-20  Age: 53 y.o. MRN: [id number]  CC:  Chief Complaint  Patient presents with   Establish Care    Patient is here to establish care with a new PCP,  Patient would like to discuss her issues with Bilateral Carpal Tunnel Syndrome , patient states that last year her symptoms started with just the right hand. She states that her symptoms are getting worse. She states that she had a cortisone shot in her right hand and now all of her fingers are numb    HPI Dana Dunlap presents to establish care. Pt is new to me.  Pt reports she is concerned with her right hand. She has been diagnosed with carpal tunnel syndrome in both hands. She says it's severe on the right hand. She has started seeing Neurologist Dr Gregg as her 2nd opinion. She was diagnosed with this 2 years ago. She has also seen orthopedic surgeon that wanted to do surgery on the right hand. She reports she's been diagnosed with severe right hand and mild left CTS. Pt also reports hx of prediabetes and would like this checked to make sure this isn't the reason for her numbness. She reports she feels comfortable getting clearance due to her seeing Hematologist for 'silent' alpha thalassemia.' Diagnosis in chart mentions iron  defi anemia.  She use to see Heme provider in [place] but needs new provider.  Reviewed previous notes from oncologist.            Outpatient Encounter Medications as of 09/26/2023  Medication Sig   acetaminophen  (TYLENOL ) 325 MG tablet Take 650 mg by mouth every 6 (six) hours as needed for mild pain (pain score 1-3). Patient taking twice weekly   amlodipine -olmesartan  (AZOR ) 10-20 MG tablet Take 1 tablet by mouth daily.   atorvastatin  (LIPITOR) 10 MG tablet Take 1 tablet (10 mg total) by mouth daily.   azelastine  (ASTELIN ) 0.1 % nasal spray Place 1 spray into both nostrils 2 (two) times daily as needed.   cycloSPORINE   (RESTASIS ) 0.05 % ophthalmic emulsion Place 1 drop into both eyes 2 (two) times daily.   DULoxetine  (CYMBALTA ) 30 MG capsule Take 1 capsule (30 mg total) by mouth 3 (three) times daily.   fluticasone  (FLONASE ) 50 MCG/ACT nasal spray PLACE 1 SPRAY INTO BOTH NOSTRILS ONCE DAILY   gabapentin  (NEURONTIN ) 300 MG capsule Take 1 capsule (300 mg total) by mouth daily.   hydrOXYzine  (ATARAX ) 25 MG tablet Take 1 tablet (25 mg total) by mouth 2 (two) times daily as needed.   ibuprofen  (ADVIL ) 400 MG tablet Take 1 tablet (400 mg total) by mouth 3 (three) times daily as needed.   sodium chloride  (OCEAN) 0.65 % SOLN nasal spray Place 1 spray into both nostrils as needed for congestion.   traZODone  (DESYREL ) 50 MG tablet Take 1 tablet (50 mg total) by mouth once nightly at bedtime as needed.   hydrochlorothiazide  (HYDRODIURIL ) 25 MG tablet Take 12.5 mg by mouth daily.   oxyCODONE -acetaminophen  (PERCOCET/ROXICET) 5-325 MG tablet Take 1 tablet by mouth every 6 (six) hours as needed for severe pain (pain score 7-10).   prednisoLONE  acetate (PRED FORTE ) 1 % ophthalmic suspension Place 1 drop into both eyes 3 (three) times daily for 5 days,then twice daily into both eyes for 5 days   No facility-administered encounter medications on file as of 09/26/2023.    Past Medical History:  Diagnosis  Date   Anemia    Depression    Currently on Cymbalta    Eye abnormalities    Fibroids    Frequent headaches    Worse during monthly menstrual cycle   Hypertension     Past Surgical History:  Procedure Laterality Date   BREAST MASS EXCISION     benign   KNEE SURGERY      Family History  Problem Relation Age of Onset   Hypertension Mother    Hyperlipidemia Mother    Stroke Mother    Diabetes Father    Cancer Father        Prostate cancer   Hypertension Father    Hyperlipidemia Father    Coronary artery disease Father    Fibroids Sister    Sickle cell trait Sister    Fibroids Sister    Sickle cell trait  Brother    Stroke Paternal Aunt    Diabetes Paternal Aunt    Sickle cell trait Other    Thalassemia Other     Social History   Socioeconomic History   Marital status: Single    Spouse name: Not on file   Number of children: 0   Years of education: 16   Highest education level: Bachelor's degree (e.g., BA, AB, BS)  Occupational History   Occupation: Retail buyer: [employer]    Comment: Molecular biology  Tobacco Use   Smoking status: Never    Passive exposure: Never   Smokeless tobacco: Never  Vaping Use   Vaping status: Never Used  Substance and Sexual Activity   Alcohol use: Yes    Alcohol/week: 1.0 standard drink of alcohol    Types: 1 Glasses of wine per week   Drug use: No   Sexual activity: Yes    Birth control/protection: Condom  Other Topics Concern   Not on file  Social History Narrative   Dana Dunlap is originally from [place]. She attended [employer] in [place], [place] where she obtained her Scientist, water quality in Biology in 1995. 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 She is in a long term relationship with her boyfried, Dana Dunlap. They have been together for 6 years. Dana Dunlap transferred to [place]  with [employer] in January. She enjoys reading and she enjoys the outdoors. She loves to travel. 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   Social Drivers of Health   Financial Resource Strain: High Risk (08/31/2022)   Overall Financial Resource Strain (CARDIA)    Difficulty of Paying Living Expenses: Hard  Food Insecurity: Low Risk  (09/21/2023)   Received from [facility]   Hunger Vital Sign    Within the past 12 months, you worried that your food would run out before you got money to buy more: Never true    Within the past 12 months, the food you bought just didn't last and you didn't have money to get more. : Never true  Transportation Needs: No Transportation Needs  (09/21/2023)   Received from [facility]    In the past 12 months, has lack of reliable transportation kept you from medical appointments, meetings, work or from getting things needed for daily living? : No  Physical Activity: Insufficiently Active (06/21/2022)   Exercise Vital Sign    Days of Exercise per Week: 1 day    Minutes of Exercise per Session: 30 min  Stress: Stress Concern Present (  08/31/2022)   [employer] of Occupational Health - Occupational Stress Questionnaire    Feeling of Stress : Very much  Social Connections: Socially Isolated (05/28/2017)   Social Connection and Isolation Panel    Frequency of Communication with Friends and Family: Once a week    Frequency of Social Gatherings with Friends and Family: Never    Attends Religious Services: Never    Database administrator or Organizations: No    Attends Banker Meetings: Never    Marital Status: Never married  Intimate Partner Violence: Not At Risk (04/25/2022)   Received from [facility]   Humiliation, Afraid, Rape, and Kick questionnaire    Within the last year, have you been afraid of your partner or ex-partner?: No    Within the last year, have you been humiliated or emotionally abused in other ways by your partner or ex-partner?: No    Within the last year, have you been kicked, hit, slapped, or otherwise physically hurt by your partner or ex-partner?: No    Within the last year, have you been raped or forced to have any kind of sexual activity by your partner or ex-partner?: No    Review of Systems  Neurological:  Positive for tingling.  All other systems reviewed and are negative.       Objective    BP 133/78   Pulse 64   Temp 98.3 F (36.8 C) (Oral)   Resp 18   Ht 5' (1.524 m)   Wt 189 lb 1.6 oz (85.8 kg)   LMP 07/30/2021 (Approximate)   SpO2 97%   BMI 36.93 kg/m   Physical Exam Vitals and nursing note reviewed.  Constitutional:      Appearance: Normal  appearance. She is obese.  HENT:     Head: Normocephalic and atraumatic.     Right Ear: External ear normal.     Left Ear: External ear normal.     Nose: Nose normal.     Mouth/Throat:     Mouth: Mucous membranes are moist.     Pharynx: Oropharynx is clear.   Eyes:     Conjunctiva/sclera: Conjunctivae normal.     Pupils: Pupils are equal, round, and reactive to light.    Cardiovascular:     Rate and Rhythm: Normal rate.  Pulmonary:     Effort: Pulmonary effort is normal.  Abdominal:     General: Bowel sounds are normal.   Skin:    General: Skin is warm.     Capillary Refill: Capillary refill takes less than 2 seconds.   Neurological:     General: No focal deficit present.     Mental Status: She is alert and oriented to person, place, and time. Mental status is at baseline.   Psychiatric:        Mood and Affect: Mood normal.        Behavior: Behavior normal.        Thought Content: Thought content normal.        Judgment: Judgment normal.        Assessment & Plan:   Problem List Items Addressed This Visit       Other   Iron  deficiency anemia due to chronic blood loss   Relevant Orders   Ambulatory referral to Hematology / Oncology   Other Visit Diagnoses       Encounter to establish care with new doctor    -  Primary     Impaired glucose tolerance  Relevant Orders   Hemoglobin A1c     Bilateral carpal tunnel syndrome         Encounter to establish care with new doctor  Iron  deficiency anemia due to chronic blood loss -     Ambulatory referral to Hematology / Oncology  Impaired glucose tolerance -     Hemoglobin A1c  Bilateral carpal tunnel syndrome   To continue follow up with ortho and neurologist for CTS. Refer to hematology for clearance before surgery A1c check today See in 3 months sooner prn.   Return in about 3 months (around 12/27/2023) for Chronic condition follow up.   Torrence CINDERELLA Barrier, MD

## 2023-09-27 ENCOUNTER — Ambulatory Visit: Payer: Self-pay | Admitting: Family Medicine

## 2023-09-27 LAB — HEMOGLOBIN A1C
Est. average glucose Bld gHb Est-mCnc: 131 mg/dL
Hgb A1c MFr Bld: 6.2 % — ABNORMAL HIGH (ref 4.8–5.6)

## 2023-10-01 ENCOUNTER — Other Ambulatory Visit (HOSPITAL_BASED_OUTPATIENT_CLINIC_OR_DEPARTMENT_OTHER): Payer: Self-pay

## 2023-10-16 ENCOUNTER — Telehealth: Payer: Self-pay

## 2023-10-16 NOTE — Telephone Encounter (Signed)
 The following request should have been forwarded to medical records. Thanks in advance.

## 2023-10-16 NOTE — Telephone Encounter (Signed)
 Copied from CRM [id number]. Topic: Referral - Status >> Oct 16, 2023  1:48 PM Powell HERO wrote: Reason for CRM: FAX [id number]. Lab notes from the last 6 months needed to be sent to [facility] inregard to this patients referral. They cannot scheule patient until labs are received. Reena  Number [phone]

## 2023-10-25 ENCOUNTER — Other Ambulatory Visit: Payer: Self-pay | Admitting: Family Medicine

## 2023-10-25 DIAGNOSIS — D500 Iron deficiency anemia secondary to blood loss (chronic): Secondary | ICD-10-CM

## 2023-10-27 LAB — CBC WITH DIFFERENTIAL/PLATELET
Basophils Absolute: 0 x10E3/uL (ref 0.0–0.2)
Basos: 1 %
EOS (ABSOLUTE): 0.1 x10E3/uL (ref 0.0–0.4)
Eos: 2 %
Hematocrit: 41.9 % (ref 34.0–46.6)
Hemoglobin: 13 g/dL (ref 11.1–15.9)
Immature Grans (Abs): 0 x10E3/uL (ref 0.0–0.1)
Immature Granulocytes: 0 %
Lymphocytes Absolute: 1.4 x10E3/uL (ref 0.7–3.1)
Lymphs: 32 %
MCH: 24.3 pg — ABNORMAL LOW (ref 26.6–33.0)
MCHC: 31 g/dL — ABNORMAL LOW (ref 31.5–35.7)
MCV: 78 fL — ABNORMAL LOW (ref 79–97)
Monocytes Absolute: 0.3 x10E3/uL (ref 0.1–0.9)
Monocytes: 7 %
Neutrophils Absolute: 2.7 x10E3/uL (ref 1.4–7.0)
Neutrophils: 58 %
Platelets: 355 x10E3/uL (ref 150–450)
RBC: 5.36 x10E6/uL — ABNORMAL HIGH (ref 3.77–5.28)
RDW: 14.9 % (ref 11.7–15.4)
WBC: 4.5 x10E3/uL (ref 3.4–10.8)

## 2023-10-27 LAB — IRON,TIBC AND FERRITIN PANEL
Ferritin: 91 ng/mL (ref 15–150)
Iron Saturation: 12 % — ABNORMAL LOW (ref 15–55)
Iron: 40 ug/dL (ref 27–159)
Total Iron Binding Capacity: 331 ug/dL (ref 250–450)
UIBC: 291 ug/dL (ref 131–425)

## 2023-11-06 ENCOUNTER — Encounter: Payer: Self-pay | Admitting: Neurology

## 2023-11-06 ENCOUNTER — Ambulatory Visit (INDEPENDENT_AMBULATORY_CARE_PROVIDER_SITE_OTHER): Payer: MEDICAID | Admitting: Neurology

## 2023-11-06 ENCOUNTER — Other Ambulatory Visit (HOSPITAL_BASED_OUTPATIENT_CLINIC_OR_DEPARTMENT_OTHER): Payer: Self-pay

## 2023-11-06 ENCOUNTER — Encounter: Payer: Self-pay | Admitting: Oncology

## 2023-11-06 ENCOUNTER — Telehealth: Payer: Self-pay

## 2023-11-06 ENCOUNTER — Ambulatory Visit (INDEPENDENT_AMBULATORY_CARE_PROVIDER_SITE_OTHER): Payer: MEDICAID | Admitting: Primary Care

## 2023-11-06 ENCOUNTER — Encounter (INDEPENDENT_AMBULATORY_CARE_PROVIDER_SITE_OTHER): Payer: Self-pay | Admitting: Primary Care

## 2023-11-06 VITALS — BP 140/82 | HR 74 | Resp 16 | Ht 60.0 in | Wt 180.0 lb

## 2023-11-06 DIAGNOSIS — D563 Thalassemia minor: Secondary | ICD-10-CM | POA: Diagnosis not present

## 2023-11-06 DIAGNOSIS — E785 Hyperlipidemia, unspecified: Secondary | ICD-10-CM | POA: Diagnosis not present

## 2023-11-06 DIAGNOSIS — I10 Essential (primary) hypertension: Secondary | ICD-10-CM

## 2023-11-06 DIAGNOSIS — R7303 Prediabetes: Secondary | ICD-10-CM | POA: Diagnosis not present

## 2023-11-06 DIAGNOSIS — F418 Other specified anxiety disorders: Secondary | ICD-10-CM

## 2023-11-06 DIAGNOSIS — F419 Anxiety disorder, unspecified: Secondary | ICD-10-CM

## 2023-11-06 DIAGNOSIS — G5603 Carpal tunnel syndrome, bilateral upper limbs: Secondary | ICD-10-CM

## 2023-11-06 DIAGNOSIS — Z7689 Persons encountering health services in other specified circumstances: Secondary | ICD-10-CM

## 2023-11-06 DIAGNOSIS — G4700 Insomnia, unspecified: Secondary | ICD-10-CM

## 2023-11-06 MED ORDER — AMLODIPINE-OLMESARTAN 10-20 MG PO TABS
1.0000 | ORAL_TABLET | Freq: Every day | ORAL | 1 refills | Status: DC
  Filled 2023-11-06 – 2023-11-28 (×2): qty 90, 90d supply, fill #0

## 2023-11-06 MED ORDER — TRAZODONE HCL 50 MG PO TABS
50.0000 mg | ORAL_TABLET | Freq: Every evening | ORAL | 1 refills | Status: DC | PRN
  Filled 2023-11-06: qty 90, 90d supply, fill #0

## 2023-11-06 NOTE — Telephone Encounter (Signed)
 I spoke to the patient when she was with Rosaline Bohr, NP at her appointment today.  She explained that she has faced a number of challenges in her life, including the loss of a job and homelessness.  She is currently staying at [facility].  She was tearful throughout the conversation. She said she is anxious and depressed but did not expand on those thoughts.  I asked her if she has ever been contacted by a [facility] and she said yes but has not been able to reach the worker. She spoke about her frustration with [facility] and the lack of support and lack communication they have provided.  She said she has left messages for Ellender, the care manager but he doesn't return calls.  She could not remember the name of the Care Management Agency but gave me Marvin's number: [phone]. I told her that I will call Ellender and I will also check on the agency that is providing the tailored care management.  She gave me permission to reach out to [place].  I also told her that I can refer her to [facility] for LCSW support to help her manage her anxiety and depression.  She was extremely appreciative of any assistance that can be provided    I then referred her to [facility]. I called Ellender, and left a message requesting a call back. I did not leave the patient's name or identifying information. I also sent a message to Altamese Adler, Referral Coordinator asking if she can identify the patient's care management entity.

## 2023-11-06 NOTE — Progress Notes (Signed)
 Subjective:   Dana Dunlap is a 53 y.o. female presents for establish care. Voiced concerns of health care treatment and not probably diagnostics providers thought it was all in her head. To later find out the pain left knee she was dx torn mensicus . Difficulty walking, bending going up stairs  given crutches and sent to PT. Terminated her the week of surgery worked at [employer] worked there 15 years. Held position for 3 months than terminated.  Past Medical History:  Diagnosis Date   Anemia    Depression    Currently on Cymbalta    Eye abnormalities    Fibroids    Frequent headaches    Worse during monthly menstrual cycle   Hypertension      Allergies  Allergen Reactions   Prochlorperazine Anaphylaxis and Other (See Comments)    Seizure Seizure Other reaction(s): Other (See Comments), Other (See Comments), Other (See Comments) Seizure Seizure Seizure Seizure AKA Prozac   Other reaction(s):  Seizure Seizure   Tramadol  Hives   Compazine [Prochlorperazine Edisylate] Other (See Comments)    Seizure    Other     Sneezing and red eyes    Current Outpatient Medications on File Prior to Visit  Medication Sig Dispense Refill   acetaminophen  (TYLENOL ) 325 MG tablet Take 650 mg by mouth every 6 (six) hours as needed for mild pain (pain score 1-3). Patient taking twice weekly     amlodipine -olmesartan  (AZOR ) 10-20 MG tablet Take 1 tablet by mouth daily. 90 tablet 2   atorvastatin  (LIPITOR) 10 MG tablet Take 1 tablet (10 mg total) by mouth daily. 90 tablet 3   azelastine  (ASTELIN ) 0.1 % nasal spray Place 1 spray into both nostrils 2 (two) times daily as needed. 30 mL 0   cycloSPORINE  (RESTASIS ) 0.05 % ophthalmic emulsion Place 1 drop into both eyes 2 (two) times daily. 120 each 4   DULoxetine  (CYMBALTA ) 30 MG capsule Take 1 capsule (30 mg total) by mouth 3 (three) times daily. 180 capsule 0   fluticasone  (FLONASE ) 50 MCG/ACT nasal spray PLACE 1 SPRAY INTO BOTH  NOSTRILS ONCE DAILY 16 g 2   gabapentin  (NEURONTIN ) 300 MG capsule Take 1 capsule (300 mg total) by mouth daily. 30 capsule 1   hydrOXYzine  (ATARAX ) 25 MG tablet Take 1 tablet (25 mg total) by mouth 2 (two) times daily as needed. 60 tablet 3   ibuprofen  (ADVIL ) 400 MG tablet Take 1 tablet (400 mg total) by mouth 3 (three) times daily as needed. 15 tablet 0   sodium chloride  (OCEAN) 0.65 % SOLN nasal spray Place 1 spray into both nostrils as needed for congestion. 30 mL 0   traZODone  (DESYREL ) 50 MG tablet Take 1 tablet (50 mg total) by mouth once nightly at bedtime as needed. 30 tablet 1   No current facility-administered medications on file prior to visit.    Review of System: ROS Comprehensive ROS Pertinent positive and negative noted in HPI   Objective:  BP (!) 148/85 (BP Location: Right Arm, Patient Position: Sitting, Cuff Size: Normal)   Pulse 74   Resp 16   Ht 5' (1.524 m)   Wt 180 lb (81.6 kg)   LMP 07/30/2021 (Approximate)   SpO2 98%   BMI 35.15 kg/m   Filed Weights   11/06/23 1411  Weight: 180 lb (81.6 kg)    Physical Exam Vitals reviewed.  Constitutional:      Appearance: Normal appearance. She is obese.  HENT:  Head: Normocephalic.     Right Ear: Tympanic membrane, ear canal and external ear normal.     Left Ear: Tympanic membrane, ear canal and external ear normal.     Nose: Nose normal.     Mouth/Throat:     Mouth: Mucous membranes are moist.  Eyes:     Extraocular Movements: Extraocular movements intact.     Pupils: Pupils are equal, round, and reactive to light.  Cardiovascular:     Rate and Rhythm: Normal rate and regular rhythm.  Pulmonary:     Effort: Pulmonary effort is normal.     Breath sounds: Normal breath sounds.  Abdominal:     General: Bowel sounds are normal. There is distension.     Palpations: Abdomen is soft.  Musculoskeletal:        General: Normal range of motion.     Cervical back: Normal range of motion and neck supple.   Skin:    General: Skin is warm and dry.  Neurological:     Mental Status: She is alert and oriented to person, place, and time.  Psychiatric:        Mood and Affect: Mood normal.        Behavior: Behavior normal.        Thought Content: Thought content normal.      Assessment:  Encarnacion was seen today for anxiety and medication management.  Diagnoses and all orders for this visit:  Encounter to establish care  Anxiety and depression Depression can affect your thoughts and feelings, relationships, daily activities, and physical health.   traZODone  (DESYREL ) 50 MG tablet    Alpha thalassemia silent carrier  Prediabetes.  Prediabetes is 5.7-6.4 monitor carbohydrates -rice, potatoes, tortillas, [place] Corn Masa Flour, breads, pasta, sweets, sodas.  Increase exercising to help maintain appropriate weight.   Hyperlipidemia, unspecified hyperlipidemia type  Healthy lifestyle diet of fruits vegetables fish nuts whole grains and low saturated fat . Foods high in cholesterol or liver, fatty meats,cheese, butter avocados, nuts and seeds, chocolate and fried foods.     Essential hypertension BP goal - < 130/80 Explained that having normal blood pressure is the goal and medications are helping to get to goal and maintain normal blood pressure. DIET: Limit salt intake, read nutrition labels to check salt content, limit fried and high fatty foods  Avoid using multisymptom OTC cold preparations that generally contain sudafed which can rise BP. Consult with pharmacist on best cold relief products to use for persons with HTN EXERCISE Discussed incorporating exercise such as walking - 30 minutes most days of the week and can do in 10 minute intervals       This note has been created with Education officer, environmental. Any transcriptional errors are unintentional.   No follow-ups on file.  Rosaline SHAUNNA Bohr, NP 11/06/2023, 2:41 PM

## 2023-11-06 NOTE — Patient Instructions (Signed)
 Please follow up with hand surgeon for possible carpal tunnel release surgery  Continue to follow up with PCP  Return as needed

## 2023-11-06 NOTE — Progress Notes (Signed)
 [facility]  PATIENT: Dana Dunlap DOB: 16-Jul-1970  REQUESTING CLINICIAN: Louder Dunlap Dana Dunlap,* HISTORY FROM: Patient   REASON FOR VISIT: Bilateral hands weakness and numbness    HISTORICAL  CHIEF COMPLAINT:  Chief Complaint  Patient presents with   Follow-up    Rm 13, alone carpal tunnel syndrome   INTERVAL HISTORY 11/06/2023 Patient presents for follow-up, last visit was in February, at that time we obtained a nerve conduction study which confirmed the diagnosis of bilateral carpal tunnel syndrome.  Patient was referred to hand surgeon but she continues to delay surgery.  She has a wrist brace, tells me that she did have an injection couple weeks ago and that was helpful.   HISTORY OF PRESENT ILLNESS:  This is a 53 year old woman past medical history of hypertension, hyperlipidemia, anxiety/depression who is presenting with bilateral hands numbness.  Patient reports the symptoms started 2 years ago with the left hand numbness and pain.  Then this past November, she started experiencing pain in the right hand.  She reports at this time, the pain subsided but every morning she will have bilateral hands numbness, finger locking and weakness.  She was told at one point that she does have carpal tunnel syndrome but never had a EMG nerve conduction study.  She does not use any braces at night.   OTHER MEDICAL CONDITIONS: Hypertension, Hyperlipidemia, Anxiety/Depression    REVIEW OF SYSTEMS: Full 14 system review of systems performed and negative with exception of: As noted in the HPI  ALLERGIES: Allergies  Allergen Reactions   Prochlorperazine Anaphylaxis and Other (See Comments)    Seizure Seizure Other reaction(s): Other (See Comments), Other (See Comments), Other (See Comments) Seizure Seizure Seizure Seizure AKA Prozac   Other reaction(s):  Seizure Seizure   Tramadol  Hives   Compazine [Prochlorperazine Edisylate] Other (See Comments)     Seizure    Other     Sneezing and red eyes    HOME MEDICATIONS: Outpatient Medications Prior to Visit  Medication Sig Dispense Refill   acetaminophen  (TYLENOL ) 325 MG tablet Take 650 mg by mouth every 6 (six) hours as needed for mild pain (pain score 1-3). Patient taking twice weekly     amlodipine -olmesartan  (AZOR ) 10-20 MG tablet Take 1 tablet by mouth daily. 90 tablet 2   atorvastatin  (LIPITOR) 10 MG tablet Take 1 tablet (10 mg total) by mouth daily. 90 tablet 3   azelastine  (ASTELIN ) 0.1 % nasal spray Place 1 spray into both nostrils 2 (two) times daily as needed. 30 mL 0   cycloSPORINE  (RESTASIS ) 0.05 % ophthalmic emulsion Place 1 drop into both eyes 2 (two) times daily. 120 each 4   DULoxetine  (CYMBALTA ) 30 MG capsule Take 1 capsule (30 mg total) by mouth 3 (three) times daily. 180 capsule 0   fluticasone  (FLONASE ) 50 MCG/ACT nasal spray PLACE 1 SPRAY INTO BOTH NOSTRILS ONCE DAILY 16 g 2   gabapentin  (NEURONTIN ) 300 MG capsule Take 1 capsule (300 mg total) by mouth daily. 30 capsule 1   hydrOXYzine  (ATARAX ) 25 MG tablet Take 1 tablet (25 mg total) by mouth 2 (two) times daily as needed. 60 tablet 3   ibuprofen  (ADVIL ) 400 MG tablet Take 1 tablet (400 mg total) by mouth 3 (three) times daily as needed. 15 tablet 0   sodium chloride  (OCEAN) 0.65 % SOLN nasal spray Place 1 spray into both nostrils as needed for congestion. 30 mL 0   traZODone  (DESYREL ) 50 MG tablet Take 1 tablet (50 mg total)  by mouth once nightly at bedtime as needed. 30 tablet 1   No facility-administered medications prior to visit.    PAST MEDICAL HISTORY: Past Medical History:  Diagnosis Date   Anemia    Depression    Currently on Cymbalta    Eye abnormalities    Fibroids    Frequent headaches    Worse during monthly menstrual cycle   Hypertension     PAST SURGICAL HISTORY: Past Surgical History:  Procedure Laterality Date   BREAST MASS EXCISION     benign   KNEE SURGERY      FAMILY  HISTORY: Family History  Problem Relation Age of Onset   Hypertension Mother    Hyperlipidemia Mother    Stroke Mother    Diabetes Father    Cancer Father        Prostate cancer   Hypertension Father    Hyperlipidemia Father    Coronary artery disease Father    Fibroids Sister    Sickle cell trait Sister    Fibroids Sister    Sickle cell trait Brother    Stroke Paternal Aunt    Diabetes Paternal Aunt    Sickle cell trait Other    Thalassemia Other     SOCIAL HISTORY: Social History   Socioeconomic History   Marital status: Single    Spouse name: Not on file   Number of children: 0   Years of education: 16   Highest education level: Bachelor's degree (e.g., BA, AB, BS)  Occupational History   Occupation: Retail buyer: [employer]    Comment: Molecular biology  Tobacco Use   Smoking status: Never    Passive exposure: Never   Smokeless tobacco: Never  Vaping Use   Vaping status: Never Used  Substance and Sexual Activity   Alcohol use: Yes    Alcohol/week: 1.0 standard drink of alcohol    Types: 1 Glasses of wine per week   Drug use: No   Sexual activity: Yes    Birth control/protection: Condom  Other Topics Concern   Not on file  Social History Narrative   Dana Dunlap is originally from [place]. She attended [employer] in [place], [place] where she obtained her Scientist, water quality in Biology in 1995. 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 She is in a long term relationship with her boyfried, Dana Dunlap. They have been together for 6 years. Dana Dunlap transferred to Dana Dunlap  with [employer] in January. She enjoys reading and she enjoys the outdoors. She loves to travel. 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   Social Drivers of Corporate investment banker Strain: High Risk (08/31/2022)   Overall Financial Resource Strain (CARDIA)    Difficulty of Paying Living Expenses: Hard  Food  Insecurity: Low Risk  (09/21/2023)   Received from [facility]   Hunger Vital Sign    Within the past 12 months, you worried that your food would run out before you got money to buy more: Never true    Within the past 12 months, the food you bought just didn't last and you didn't have money to get more. : Never true  Transportation Needs: No Transportation Needs (09/21/2023)   Received from [facility]    In the past 12 months, has lack of reliable transportation kept you from medical appointments, meetings, work or from getting things needed for daily living? :  No  Physical Activity: Insufficiently Active (06/21/2022)   Exercise Vital Sign    Days of Exercise per Week: 1 day    Minutes of Exercise per Session: 30 min  Stress: Stress Concern Present (08/31/2022)   [employer] of Occupational Health - Occupational Stress Questionnaire    Feeling of Stress : Very much  Social Connections: Socially Isolated (05/28/2017)   Social Connection and Isolation Panel    Frequency of Communication with Friends and Family: Once a week    Frequency of Social Gatherings with Friends and Family: Never    Attends Religious Services: Never    Database administrator or Organizations: No    Attends Banker Meetings: Never    Marital Status: Never married  Intimate Partner Violence: Not At Risk (04/25/2022)   Received from [facility]   Humiliation, Afraid, Rape, and Kick questionnaire    Within the last year, have you been afraid of your partner or ex-partner?: No    Within the last year, have you been humiliated or emotionally abused in other ways by your partner or ex-partner?: No    Within the last year, have you been kicked, hit, slapped, or otherwise physically hurt by your partner or ex-partner?: No    Within the last year, have you been raped or forced to have any kind of sexual activity by your partner or ex-partner?: No     PHYSICAL EXAM  GENERAL  EXAM/CONSTITUTIONAL: Vitals:  There were no vitals filed for this visit.  There is no height or weight on file to calculate BMI. Wt Readings from Last 3 Encounters:  09/26/23 189 lb 1.6 oz (85.8 kg)  09/04/23 188 lb (85.3 kg)  08/04/23 180 lb (81.6 kg)   Patient is in no distress; well developed, nourished and groomed; neck is supple  MUSCULOSKELETAL: Gait, strength, tone, movements noted in Neurologic exam below  NEUROLOGIC: MENTAL STATUS:      No data to display         awake, alert, oriented to person, place and time recent and remote memory intact normal attention and concentration language fluent, comprehension intact, naming intact fund of knowledge appropriate  CRANIAL NERVE:  2nd, 3rd, 4th, 6th - Visual fields full to confrontation, extraocular muscles intact, no nystagmus 5th - facial sensation symmetric 7th - facial strength symmetric 8th - hearing intact 9th - palate elevates symmetrically, uvula midline 11th - shoulder shrug symmetric 12th - tongue protrusion midline  MOTOR:  normal bulk and tone, full strength in the BUE, BLE  SENSORY:  normal and symmetric to light touch, positive Tinel and Phalen's signs   GAIT/STATION:  normal   DIAGNOSTIC DATA (LABS, IMAGING, TESTING) - I reviewed patient records, labs, notes, testing and imaging myself where available.  Lab Results  Component Value Date   WBC 4.5 10/26/2023   HGB 13.0 10/26/2023   HCT 41.9 10/26/2023   MCV 78 (L) 10/26/2023   PLT 355 10/26/2023      Component Value Date/Time   NA 143 02/07/2023 1704   NA 144 01/11/2023 1107   NA 139 04/10/2014 1601   K 3.8 02/07/2023 1704   K 3.8 04/10/2014 1601   CL 108 02/07/2023 1704   CL 108 (H) 04/10/2014 1601   CO2 26 02/07/2023 1704   CO2 22 04/10/2014 1601   GLUCOSE 88 02/07/2023 1704   GLUCOSE 93 04/10/2014 1601   BUN 8 02/07/2023 1704   BUN 10 01/11/2023 1107   BUN 7 04/10/2014  1601   CREATININE 0.95 02/07/2023 1704   CREATININE  0.87 04/10/2014 1601   CALCIUM  10.1 02/07/2023 1704   CALCIUM  8.8 04/10/2014 1601   PROT 8.2 (H) 08/01/2021 1527   PROT 6.5 12/10/2018 1640   PROT 8.3 (H) 04/10/2014 1601   ALBUMIN 4.2 08/01/2021 1527   ALBUMIN 4.0 12/10/2018 1640   ALBUMIN 3.9 04/10/2014 1601   AST 21 08/01/2021 1527   AST 39 (H) 04/10/2014 1601   ALT 15 08/01/2021 1527   ALT 15 04/10/2014 1601   ALKPHOS 73 08/01/2021 1527   ALKPHOS 48 04/10/2014 1601   BILITOT 0.4 08/01/2021 1527   BILITOT 0.9 12/10/2018 1640   BILITOT 0.3 04/10/2014 1601   GFRNONAA >60 02/07/2023 1704   GFRNONAA >60 04/10/2014 1601   GFRAA >60 12/16/2018 1442   GFRAA >60 04/10/2014 1601   Lab Results  Component Value Date   CHOL 216 (H) 01/26/2021   HDL 74 01/26/2021   LDLCALC 133 (H) 01/26/2021   TRIG 50 01/26/2021   CHOLHDL 2.9 01/26/2021   Lab Results  Component Value Date   HGBA1C 6.2 (H) 09/26/2023   Lab Results  Component Value Date   VITAMINB12 438 02/11/2021   Lab Results  Component Value Date   TSH 1.600 12/10/2018   EMG/NCS 06/27/2023 This is a mild abnormal study. There is electrodiagnostic evidence of mild bilateral median neuropathy across the wrist consistent with mild bilateral carpal tunnel syndromes.   ASSESSMENT AND PLAN  53 y.o. year old female with hypertension, hyperlipidemia, anxiety/depression who is presenting for follow-up for her carpal tunnel syndrome.  She did have an EMG nerve conduction study which confirmed the diagnosis.  We have referred her to hand surgeon but she continues to delay surgery.  At today's visit, she is agreeable to surgery and stated that she will discuss with hand surgery at her visit next week.  Continue to follow with PCP return as needed.   1. Bilateral carpal tunnel syndrome      Patient Instructions  Please follow up with hand surgeon for possible carpal tunnel release surgery  Continue to follow up with PCP  Return as needed   No orders of the defined types were  placed in this encounter.   No orders of the defined types were placed in this encounter.   Return if symptoms worsen or fail to improve.    Pastor Falling, MD 11/06/2023, 9:11 AM  [facility] [address] [place], [place] [address] [phone]

## 2023-11-06 NOTE — Telephone Encounter (Signed)
 Per Altamese Adler, the Care Management Agency is [facility]: [phone]

## 2023-11-06 NOTE — Patient Instructions (Signed)

## 2023-11-07 ENCOUNTER — Telehealth: Payer: Self-pay

## 2023-11-07 NOTE — Telephone Encounter (Signed)
 I called [facility] and had to leave a message for [employer] requesting a call back

## 2023-11-07 NOTE — Progress Notes (Signed)
 Complex Care Management Note Care Guide Note  11/07/2023 Name: Dana Dunlap MRN: [id number] DOB: [birth year]   Complex Care Management Outreach Attempts: An unsuccessful telephone outreach was attempted today to offer the patient information about available complex care management services.  Follow Up Plan:  Additional outreach attempts will be made to offer the patient complex care management information and services.   Encounter Outcome:  No Answer  Jeoffrey Buffalo , RMA     [place]  [facility], [facility] Guide  Direct Dial: [phone]  Website: [email or web address]

## 2023-11-08 NOTE — Telephone Encounter (Signed)
 MyChart message sent to patient with the information about her new care manager,

## 2023-11-08 NOTE — Telephone Encounter (Signed)
 I spoke to Dana Dunlap, care management extender- [facility] and explained the patient's frustration with accessing assistance from [place]. The patient has reached out to her assigned Tailored Care Manager (TCM) , Dana Dunlap, but has not heard back.   Dana Dunlap said that the patient has been re-assigned to a new Dana Dunlap Alert : [phone].  Dana Dunlap explained that Dana Dunlap is on vacation until 11/14/2023 and Dana Dunlap said she will reach out to the patient and inform her of the new TCM.

## 2023-11-08 NOTE — Telephone Encounter (Signed)
 I tried to reach the patient to inquire if she has heard from agency, and if not, I will share the contact information for her new TCM with her; but her voicemail was not set up.

## 2023-11-15 ENCOUNTER — Other Ambulatory Visit: Payer: MEDICAID

## 2023-11-15 NOTE — Patient Outreach (Signed)
 Complex Care Management   Visit Note  11/15/2023  Name:  Dana Dunlap MRN: [id number] DOB: 1970/11/08  Situation: Referral received for Complex Care Management related to LCSW and needing to connect to a new care management agency I obtained verbal consent from Patient.  Visit completed with patient  on the phone  Background:   Past Medical History:  Diagnosis Date   Anemia    Depression    Currently on Cymbalta    Eye abnormalities    Fibroids    Frequent headaches    Worse during monthly menstrual cycle   Hypertension     Assessment: BSW held initial call with pt. Pt was alert and cognitive. However, pt became quickly upset about her current situation and demonstrated signs of need for mental health services. Pt repetitively stated the system is broken while crying when referring to the healthcare system. Patient reports having a negative experience with [facility] and not receiving the needed resources and mental help she needs. BSW and patient called [facility] and spoke with a representative (REF #: [id number]). Pt expressed desire to change care management provider and a new request was submitted over the phone for a new provider. Patient and BSW were encouraged to call next Thursday to check the status of the request. Pt understood and agreed. BSW emailed patient website for [employer] extra benefits and [employer]. No other resources were provided/requested at this time.   SDOH Interventions    Flowsheet Row Office Visit from 11/06/2023 in [facility] Office Visit from 09/26/2023 in [facility] at [facility] Telephone from 08/31/2022 in [facility] at [facility] Office Visit from 08[facility]12/2020 [facility] Treatment Planning from 07/27/2020 in [facility] Treatment Planning from 07/13/2020 in [facility]  SDOH Interventions        Food  Insecurity Interventions -- -- Intervention Not Indicated Intervention Not Indicated -- --  Housing Interventions -- -- -- Intervention Not Indicated -- --  Transportation Interventions -- -- Intervention Not Indicated Intervention Not Indicated -- --  Utilities Interventions -- -- Intervention Not Indicated -- -- --  Depression Interventions/Treatment  Counseling, Medication Referral to Psychiatry -- -- Currently on Treatment Currently on Treatment  Financial Strain Interventions -- -- Other (Comment)  [will mail resources for legal aid, code compliance, urban ministries] -- -- --  Stress Interventions -- -- [employer] -- -- --      Recommendation:   F/u with [facility] regarding new care management request.   Follow Up Plan:   Telephone follow up appointment date/time:  11/22/2023 at 9:30AM.   Laymon Doll, BSW [place]/VBCI - [employer] Social Worker [phone]

## 2023-11-15 NOTE — Telephone Encounter (Signed)
 I called the patient [phone] to see if she heard from her Tailored Care Manager today and her voicemail was not set up, unable to leave a message.

## 2023-11-15 NOTE — Patient Instructions (Signed)
 Visit Information  Thank you for taking time to visit with me today.   Tailored Plan Medicaid On October 02, 2022 some people on [place] Medicaid will move to a new kind of Medicaid health plan called a Tailored Plan. Tailored Plans cover your doctor visits, prescription drugs, and health care services.    If your [place] Medicaid will move to a Tailored Plan, you should have gotten a letter and welcome packet. If you're not sure, call your [place] Medicaid Enrollment Broker at [phone] and ask.  Check out these free materials, in [place] and [place], to learn more about your Tailored Plan: Medicaid.[email or web address].Gov/Tailored-Plans/Toolkit  Tailored Care Management Services  TCM services are available to you now. If you are a Tailored Plan member or will be and want information about Tailored Care Management Services including rides to appointments and community and home services, call the Care Management provider for your county of residence:    [facility] [place], [place])  Member Services: [phone] Behavioral Health Crisis Line: [phone], [place], [place], [place], [place])  Member Services: (680)689-1661 Behavioral Health Crisis Line: 319-176-7716     Please call the Suicide and Crisis Lifeline: 988 go to [facility] [address] [place] [phone]) call 911 if you are experiencing a Mental Health or Behavioral Health Crisis or need someone to talk to.  Patient verbalizes understanding of instructions and care plan provided today and agrees to view in MyChart. Active MyChart status and patient understanding of how to access instructions and care plan via MyChart confirmed with patient.     [name], BSW [place]/VBCI - [employer] Social Worker [phone]

## 2023-11-16 ENCOUNTER — Other Ambulatory Visit (HOSPITAL_BASED_OUTPATIENT_CLINIC_OR_DEPARTMENT_OTHER): Payer: Self-pay

## 2023-11-20 NOTE — Telephone Encounter (Signed)
 I called the patient to see if she had been contacted by her new tailored care manager and she said she had, Rachael had called her.  She went on to say that she explained to Rachael how disappointed she is with [facility], especially, [place], the care manager she was assigned. She spoke about how she did not appreciated how she was treated by [facility] and she told Rachael that she will be finding a new company to assist her. She went on to say that she has been working with [place] Munoz-Lopez, BSW/ VBCI and she really appreciates all of the help he has been providing.  She said he has been helping her find a new care management agency with [facility]. She is very pleased with all of his efforts and said that he is very organized and has been spending time making calls with her and sending her information to read.  She stated that she has another appointment scheduled with him.  She went on to speak of her disability due to her left knee arthritis and how she feels she was misdiagnosed in [place] and had to go to [facility] for surgery.  She said she has been out of work since 05/2019 and has been working with an Pensions consultant here in [place] and submitted a disability application.  She said she spoke to her attorney recently and was informed that she was approved for disability but the back pay did not start until 04/2023 and she thinks there should be more back pay than that.  She said she spoke to the attorney about appealing that decision and the attorney told her I wouldn't appeal if I were you. The patient was upset and felt that no one really cares about her well being and asked if there is anyone that can advocate for her.  I explained to her that we work with [employer] [facility] [place] and they can assist with disability denials but they are not able to work with her if she is already working with an attorney as that would be a conflict of interest.  I told her that I can ask Legal Aid if they have any suggestions for her on how to  proceed without placing a referral or sharing any PHI and she was in agreement. I told her that I will get back to her after I hear from Legal Aid.

## 2023-11-21 ENCOUNTER — Ambulatory Visit: Payer: MEDICAID | Admitting: Neurology

## 2023-11-22 ENCOUNTER — Other Ambulatory Visit: Payer: MEDICAID

## 2023-11-22 NOTE — Patient Outreach (Signed)
 Complex Care Management   Visit Note  11/22/2023  Name:  Dana Dunlap MRN: [id number] DOB: [birth year]  Situation: Referral received for Complex Care Management related to needing to connect to a new care management agency I obtained verbal consent from Patient.  Visit completed with Patient  on the phone  Background:   Past Medical History:  Diagnosis Date   Anemia    Depression    Currently on Cymbalta    Eye abnormalities    Fibroids    Frequent headaches    Worse during monthly menstrual cycle   Hypertension     Assessment: BSW held f/u appt with pt. Pt was emotional through most of the call and was confused as to why Slater PEAK was reaching out to her. BSW provided explanation and context and this seemed to help patient feel more calm. BSW and patient called [facility] to request an update on her request for a new care management provider and were told the request was submitted but it has not been assigned to a new care Production designer, theatre/television/film. Patient was instructed to call back tomorrow to request an update or next week to see if she has been assigned to a new agency. Patient understood and agreed to call back tomorrow.   BSW provided patient resources for housing and rental assistance. No other resources were provided/requested at this time. Pt informed BSW she was approved for SSDI recently but became very upset when discussing that she was not getting any or enough back pay. BSW encouraged her to reach out to her attorney and attempt to get clarification around this situation. Pt understood and agreed.   SDOH Interventions    Flowsheet Row Office Visit from 11/06/2023 in [facility] Family Medicine Office Visit from 09/26/2023 in [facility] Primary Care at [facility] Telephone from 08/31/2022 in [facility] Heart & Vascular at [facility] Office Visit from 08/12/2020 in [facility] Treatment Planning from 07/27/2020 in [facility] I[facility] Treatment Planning from 07/13/2020 in [facility]  SDOH Interventions        Food Insecurity Interventions -- -- Intervention Not Indicated Intervention Not Indicated -- --  Housing Interventions -- -- -- Intervention Not Indicated -- --  Transportation Interventions -- -- Intervention Not Indicated Intervention Not Indicated -- --  Utilities Interventions -- -- Intervention Not Indicated -- -- --  Depression Interventions/Treatment  Counseling, Medication Referral to Psychiatry -- -- Currently on Treatment Currently on Treatment  Financial Strain Interventions -- -- Other (Comment)  [will mail resources for legal aid, code compliance, urban ministries] -- -- --  Stress Interventions -- -- [employer] -- -- --    Patient Reported Symptoms:  Cognitive        Neurological      HEENT        Cardiovascular      Respiratory      Endocrine      Gastrointestinal        Genitourinary      Integumentary      Musculoskeletal          Psychosocial            11/22/2023    PHQ2-9 Depression Screening   Little interest or pleasure in doing things    Feeling down, depressed, or hopeless    PHQ-2 - Total Score    Trouble falling or staying asleep, or sleeping too much  Feeling tired or having little energy    Poor appetite or overeating     Feeling bad about yourself - or that you are a failure or have let yourself or your family down    Trouble concentrating on things, such as reading the newspaper or watching television    Moving or speaking so slowly that other people could have noticed.  Or the opposite - being so fidgety or restless that you have been moving around a lot more than usual    Thoughts that you would be better off dead, or hurting yourself in some way    PHQ2-9 Total Score    If you checked off any problems, how difficult have these problems made it for you to do your work, take care of things at home,  or get along with other people    Depression Interventions/Treatment      There were no vitals filed for this visit.  Medications Reviewed Today   Medications were not reviewed in this encounter     Recommendation:   Review housing resources  Follow Up Plan:   Telephone follow up appointment date/time:  11/23/2023 at 3:30pm  Laymon Doll, BSW [place]/VBCI - [facility] Social Worker [phone]

## 2023-11-22 NOTE — Patient Instructions (Signed)
 Visit Information  Thank you for taking time to visit with me today.   Tailored Plan Medicaid On October 02, 2022 some people on [place] Medicaid will move to a new kind of Medicaid health plan called a Tailored Plan. Tailored Plans cover your doctor visits, prescription drugs, and health care services.    If your [place] Medicaid will move to a Tailored Plan, you should have gotten a letter and welcome packet. If you're not sure, call your [place] Medicaid Enrollment Broker at [phone] and ask.  Check out these free materials, in [place] and [place], to learn more about your Tailored Plan: Medicaid.[email or web address].Gov/Tailored-Plans/Toolkit  Tailored Care Management Services  TCM services are available to you now. If you are a Tailored Plan member or will be and want information about Tailored Care Management Services including rides to appointments and community and home services, call the Care Management provider for your county of residence:    [facility] [place], [place])  Member Services: [phone] Behavioral Health Crisis Line: [phone], [place], [place], [place], [place])  Member Services: (680)689-1661 Behavioral Health Crisis Line: 319-176-7716     Please call the Suicide and Crisis Lifeline: 988 go to [facility] [address] [place] [phone]) call 911 if you are experiencing a Mental Health or Behavioral Health Crisis or need someone to talk to.  Patient verbalizes understanding of instructions and care plan provided today and agrees to view in MyChart. Active MyChart status and patient understanding of how to access instructions and care plan via MyChart confirmed with patient.     [name], BSW [place]/VBCI - [employer] Social Worker [phone]

## 2023-11-22 NOTE — Telephone Encounter (Signed)
 I called the patient to explain the feedback I received from [employer] [place] regarding her disability decision but I was not able to leave a message, her voicemail was not set up.    I wanted to inform her that the attorney I contacted at [employer] Aid said that requesting an appeal from Social Security can carry some risk. When a case is reviewed on appeal, the entire claim is subject to re-evaluation, including any parts of the original decision that were favorable for the patient.    She also noted that she would encourage the patient  to speak with her attorney for clarity about why her back pay was limited to that period of time and what an appeal could potentially mean for her case.

## 2023-11-23 ENCOUNTER — Telehealth: Payer: Self-pay

## 2023-11-26 ENCOUNTER — Telehealth: Payer: Self-pay

## 2023-11-28 ENCOUNTER — Other Ambulatory Visit (HOSPITAL_BASED_OUTPATIENT_CLINIC_OR_DEPARTMENT_OTHER): Payer: Self-pay

## 2023-11-28 ENCOUNTER — Ambulatory Visit: Payer: Self-pay

## 2023-11-28 ENCOUNTER — Other Ambulatory Visit: Payer: Self-pay

## 2023-11-28 MED FILL — Atorvastatin Calcium Tab 10 MG (Base Equivalent): ORAL | 90 days supply | Qty: 90 | Fill #1 | Status: AC

## 2023-11-28 NOTE — Telephone Encounter (Signed)
 FYI Only or Action Required?: Action required by provider: medication refill request.  Patient was last seen in primary care on 11/06/2023 by Celestia Rosaline SQUIBB, NP.  Called Nurse Triage reporting dizziness.  Symptoms began 3 days .  Interventions attempted: Nothing.  Symptoms are: unchanged.  Triage Disposition: call PCP now Patient/caregiver understands and will follow disposition?: yes  Note: pt refused triage. Just wants med refilled. Pt stated she was running low at her last appt.  Discontinued None Celestia Rosaline SQUIBB, NP 11/06/23 1538                Copied from CRM #[id number]. Topic: Clinical - Red Word Triage >> Nov 28, 2023 10:53 AM Harlene ORN wrote: Red Word that prompted transfer to Nurse Triage:  has been out of her Cymbalta  for three days and is starting to get dizzy Reason for Disposition  [1] Prescription refill request for ESSENTIAL medicine (i.e., likelihood of harm to patient if not taken) AND [2] triager unable to refill per department policy  Answer Assessment - Initial Assessment Questions 1. DESCRIPTION: Describe your dizziness.     *No Answer* 2. LIGHTHEADED: Do you feel lightheaded? (e.g., somewhat faint, woozy, weak upon standing)     *No Answer* 3. VERTIGO: Do you feel like either you or the room is spinning or tilting? (i.e., vertigo)     *No Answer* 4. SEVERITY: How bad is it?  Do you feel like you are going to faint? Can you stand and walk?     yes 5. ONSET:  When did the dizziness begin?     3 days ago  6. AGGRAVATING FACTORS: Does anything make it worse? (e.g., standing, change in head position)     *No Answer* 7. HEART RATE: Can you tell me your heart rate? How many beats in 15 seconds?  (Note: Not all patients can do this.)       *No Answer* 8. CAUSE: What do you think is causing the dizziness? (e.g., decreased fluids or food, diarrhea, emotional distress, heat exposure, new medicine, sudden standing, vomiting;  unknown)     *No Answer* 9. RECURRENT SYMPTOM: Have you had dizziness before? If Yes, ask: When was the last time? What happened that time?     *No Answer* 10. OTHER SYMPTOMS: Do you have any other symptoms? (e.g., fever, chest pain, vomiting, diarrhea, bleeding)       *No Answer* 11. PREGNANCY: Is there any chance you are pregnant? When was your last menstrual period?       *No Answer*  Answer Assessment - Initial Assessment Questions 1. DRUG NAME: What medicine do you need to have refilled?     Cymbalta  2. REFILLS REMAINING: How many refills are remaining? Notes: The label on the medicine or pill bottle will show how many refills are remaining. If there are no refills remaining, then a renewal may be needed.     none 6. SYMPTOMS: Do you have any symptoms?     dizziness  Protocols used: Dizziness - Lightheadedness-A-AH, Medication Refill and Renewal Call-A-AH

## 2023-11-29 ENCOUNTER — Other Ambulatory Visit (INDEPENDENT_AMBULATORY_CARE_PROVIDER_SITE_OTHER): Payer: Self-pay | Admitting: Primary Care

## 2023-11-29 ENCOUNTER — Other Ambulatory Visit: Payer: MEDICAID

## 2023-11-29 ENCOUNTER — Encounter (HOSPITAL_BASED_OUTPATIENT_CLINIC_OR_DEPARTMENT_OTHER): Payer: Self-pay

## 2023-11-29 ENCOUNTER — Other Ambulatory Visit (HOSPITAL_COMMUNITY): Payer: Self-pay

## 2023-11-29 ENCOUNTER — Other Ambulatory Visit (HOSPITAL_BASED_OUTPATIENT_CLINIC_OR_DEPARTMENT_OTHER): Payer: Self-pay

## 2023-11-29 MED ORDER — DULOXETINE HCL 30 MG PO CPEP
30.0000 mg | ORAL_CAPSULE | Freq: Every day | ORAL | 1 refills | Status: DC
Start: 2023-11-29 — End: 2023-11-29
  Filled 2023-11-29: qty 90, 90d supply, fill #0

## 2023-11-29 MED ORDER — DULOXETINE HCL 30 MG PO CPEP
30.0000 mg | ORAL_CAPSULE | Freq: Three times a day (TID) | ORAL | 1 refills | Status: DC
  Filled 2023-11-29: qty 90, 30d supply, fill #0

## 2023-11-29 NOTE — Patient Outreach (Signed)
 Complex Care Management   Visit Note  11/29/2023  Name:  Dana Dunlap MRN: [id number] DOB: [birth year]  Situation: Referral received for Complex Care Management related to obtain new care management agency I obtained verbal consent from Patient.  Visit completed with Patient  on the phone  Background:   Past Medical History:  Diagnosis Date   Anemia    Depression    Currently on Cymbalta    Eye abnormalities    Fibroids    Frequent headaches    Worse during monthly menstrual cycle   Hypertension     Assessment: BSW held f/u call with pt. Pt was alert and cognitive. BSW and pt called [employer] together (Ref #: [id number]). Pt has been assigned to [facility] for tailored care management services. Pt was provided contact information for [facility] office [phone]). New placement goes into effect on 12/03/2023. BSW also assisted patient in completing an application for value-added services for gym membership through [place].   BSW and pt called [employer] and spoke with Dana. Lonell explained to patient over the phone what she needed to do to begin services with them. Pt will be visiting local office next week on 09/02 at 8AM to have registration/clinical assessment done to determine what treatment and/or mental health services will be provided to Pt.  No other resources were provided/requested at this time. BSW shared office address with pt and pt verbally confirmed she wrote it down.   SDOH Interventions    Flowsheet Row Office Visit from 11/06/2023 in [facility] Family Medicine Office Visit from 09/26/2023 in [facility] Primary Care at [facility] Telephone from 08/31/2022 in [facility] Heart & Vascular at [facility] Office Visit from 08/12/2020 in [facility] Treatment Planning from 07/27/2020 in [facility] Treatment Planning from 07/13/2020 in [facility]  SDOH Interventions        Food  Insecurity Interventions -- -- Intervention Not Indicated Intervention Not Indicated -- --  Housing Interventions -- -- -- Intervention Not Indicated -- --  Transportation Interventions -- -- Intervention Not Indicated Intervention Not Indicated -- --  Utilities Interventions -- -- Intervention Not Indicated -- -- --  Depression Interventions/Treatment  Counseling, Medication Referral to Psychiatry -- -- Currently on Treatment Currently on Treatment  Financial Strain Interventions -- -- Other (Comment)  [will mail resources for legal aid, code compliance, urban ministries] -- -- --  Stress Interventions -- -- [employer] -- -- --       Recommendation:   Visit [employer] office for registration/clinical assessment.   Follow Up Plan:   Telephone follow up appointment date/time:  12/07/2023 at 1pm  Laymon Doll, BSW [place]/VBCI - [facility] Social Worker [phone]

## 2023-11-29 NOTE — Patient Instructions (Signed)
 Visit Information  Thank you for taking time to visit with me today.   Tailored Plan Medicaid On October 02, 2022 some people on [place] Medicaid will move to a new kind of Medicaid health plan called a Tailored Plan. Tailored Plans cover your doctor visits, prescription drugs, and health care services.    If your [place] Medicaid will move to a Tailored Plan, you should have gotten a letter and welcome packet. If you're not sure, call your [place] Medicaid Enrollment Broker at [phone] and ask.  Check out these free materials, in [place] and [place], to learn more about your Tailored Plan: Medicaid.[email or web address].Gov/Tailored-Plans/Toolkit  Tailored Care Management Services  TCM services are available to you now. If you are a Tailored Plan member or will be and want information about Tailored Care Management Services including rides to appointments and community and home services, call the Care Management provider for your county of residence:    [facility] [place], [place])  Member Services: [phone] Behavioral Health Crisis Line: [phone], [place], [place], [place], [place])  Member Services: (680)689-1661 Behavioral Health Crisis Line: 319-176-7716     Please call the Suicide and Crisis Lifeline: 988 go to [facility] [address] [place] [phone]) call 911 if you are experiencing a Mental Health or Behavioral Health Crisis or need someone to talk to.  Patient verbalizes understanding of instructions and care plan provided today and agrees to view in MyChart. Active MyChart status and patient understanding of how to access instructions and care plan via MyChart confirmed with patient.     [name], BSW [place]/VBCI - [employer] Social Worker [phone]

## 2023-11-30 ENCOUNTER — Other Ambulatory Visit (HOSPITAL_BASED_OUTPATIENT_CLINIC_OR_DEPARTMENT_OTHER): Payer: Self-pay

## 2023-12-04 ENCOUNTER — Other Ambulatory Visit (HOSPITAL_BASED_OUTPATIENT_CLINIC_OR_DEPARTMENT_OTHER): Payer: Self-pay

## 2023-12-04 ENCOUNTER — Other Ambulatory Visit (HOSPITAL_COMMUNITY): Payer: Self-pay

## 2023-12-05 ENCOUNTER — Other Ambulatory Visit (HOSPITAL_BASED_OUTPATIENT_CLINIC_OR_DEPARTMENT_OTHER): Payer: Self-pay

## 2023-12-05 ENCOUNTER — Telehealth (INDEPENDENT_AMBULATORY_CARE_PROVIDER_SITE_OTHER): Payer: Self-pay | Admitting: *Deleted

## 2023-12-05 ENCOUNTER — Other Ambulatory Visit (INDEPENDENT_AMBULATORY_CARE_PROVIDER_SITE_OTHER): Payer: Self-pay | Admitting: Primary Care

## 2023-12-05 ENCOUNTER — Telehealth (INDEPENDENT_AMBULATORY_CARE_PROVIDER_SITE_OTHER): Payer: Self-pay | Admitting: Primary Care

## 2023-12-05 DIAGNOSIS — R0981 Nasal congestion: Secondary | ICD-10-CM

## 2023-12-05 NOTE — Telephone Encounter (Signed)
 Made attempt to call patient back. No answer. Will try again tomorrow.

## 2023-12-05 NOTE — Telephone Encounter (Signed)
 Copied from CRM [id number]. Topic: General - Other >> Dec 05, 2023  2:12 PM Zebedee SAUNDERS wrote:  Reason for CRM: Pt returning Morene Meth, RN call, warm transferred to clinic per [facility] nurse is in meeting. Please call pt at [phone]

## 2023-12-05 NOTE — Telephone Encounter (Signed)
Patient returning call, Please advise

## 2023-12-05 NOTE — Telephone Encounter (Signed)
 11/29/23 Late Entry  Did not refill Cymbalta  drug interaction with  Trazodone . Patient was given the option which medication did she feel worked best and she wanted Cymbalta . Refilled and discontinued trazodone .

## 2023-12-05 NOTE — Telephone Encounter (Addendum)
 Attempt to call patient x 2 to f/u with her.  Wanted to see if she has picked up Cymbalta . (verifed with pharmacy that this medication was picked up).   Called pharmacy to verify if patient has picked Trazadone sent on 11/06/2023. Per pharmacy, it was returned to the shelf and they have noted where it has been discontinued by PCP d/t drug to drug interactions.    Also called patient to see what medications she needs to have refilled by PCP.  This will reduce the number of times she has to call back in for medication refills and also wait because they are not sent in by current PCP.

## 2023-12-06 NOTE — Telephone Encounter (Signed)
 Patient does not wish to have any medication refilled by our office again.  Per patient, she was advised by [facility] to run from the [facility] facility.   She states she will not be back to see PCP.

## 2023-12-07 ENCOUNTER — Other Ambulatory Visit (HOSPITAL_COMMUNITY): Payer: Self-pay

## 2023-12-07 ENCOUNTER — Other Ambulatory Visit: Payer: MEDICAID

## 2023-12-07 ENCOUNTER — Other Ambulatory Visit (INDEPENDENT_AMBULATORY_CARE_PROVIDER_SITE_OTHER): Payer: Self-pay | Admitting: Primary Care

## 2023-12-07 ENCOUNTER — Other Ambulatory Visit: Payer: Self-pay

## 2023-12-07 ENCOUNTER — Other Ambulatory Visit (HOSPITAL_BASED_OUTPATIENT_CLINIC_OR_DEPARTMENT_OTHER): Payer: Self-pay

## 2023-12-07 NOTE — Patient Instructions (Signed)
 Visit Information  Thank you for taking time to visit with me today.   Tailored Plan Medicaid On October 02, 2022 some people on [place] Medicaid will move to a new kind of Medicaid health plan called a Tailored Plan. Tailored Plans cover your doctor visits, prescription drugs, and health care services.    If your [place] Medicaid will move to a Tailored Plan, you should have gotten a letter and welcome packet. If you're not sure, call your [place] Medicaid Enrollment Broker at [phone] and ask.  Check out these free materials, in [place] and [place], to learn more about your Tailored Plan: Medicaid.[email or web address].Gov/Tailored-Plans/Toolkit  Tailored Care Management Services  TCM services are available to you now. If you are a Tailored Plan member or will be and want information about Tailored Care Management Services including rides to appointments and community and home services, call the Care Management provider for your county of residence:    [facility] [place], [place])  Member Services: [phone] Behavioral Health Crisis Line: [phone], [place], [place], [place], [place])  Member Services: (680)689-1661 Behavioral Health Crisis Line: 319-176-7716     Please call the Suicide and Crisis Lifeline: 988 go to [facility] [address] [place] [phone]) call 911 if you are experiencing a Mental Health or Behavioral Health Crisis or need someone to talk to.  Patient verbalizes understanding of instructions and care plan provided today and agrees to view in MyChart. Active MyChart status and patient understanding of how to access instructions and care plan via MyChart confirmed with patient.     [name], BSW [place]/VBCI - [employer] Social Worker [phone]

## 2023-12-07 NOTE — Patient Outreach (Addendum)
 Referral received from Celestia Rosaline SQUIBB, NP for assistance with care management needs. This patient is enrolled in [facility] Tailored Plan and has associated care management benefits. I reached out to the health plan today to refer the patient to the assigned health plan care management team member.   Interventions:  Dana Dunlap was advised to anticipate contact from the health plan for follow up about care management services and resources. (REF #: [id number]) BSW called [facility] with patient regarding care manager assignment at [phone] and was told someone would reach out to her within 1-2 business day regarding care management.  BSW also called [employer] local office with patient to request information on what mental health services she can receive through them. VM was left requesting call back.   Laymon Doll, BSW [place]/VBCI - [employer] Social Worker [phone]

## 2023-12-07 NOTE — Progress Notes (Signed)
 Patient indicated would not return to [facility] called pharmacy d/c refills under Niels LOISE Louder

## 2023-12-07 NOTE — Patient Outreach (Signed)
 Received incoming email from patient stating she received a call from Angelique Graham [phone]) with Merrily as her assigned care manager. Patient reports care manager will be doing a home visit next Thursday at 12pm. Patient also reports she was set up with a new therapist through [facility] Ms. Adolph Kussmaul and has a scheduled virtual therapy appt with her on 12/17/2023 at 8AM.

## 2023-12-12 ENCOUNTER — Other Ambulatory Visit (HOSPITAL_BASED_OUTPATIENT_CLINIC_OR_DEPARTMENT_OTHER): Payer: Self-pay

## 2023-12-13 ENCOUNTER — Other Ambulatory Visit: Payer: MEDICAID

## 2023-12-13 NOTE — Patient Outreach (Signed)
 Referral received from Dana Rosaline SQUIBB, NP for assistance with care management needs. This patient is enrolled in [employer] tailored plan and has associated care management benefits. I reached out to the health plan today to refer the patient to the assigned health plan care management team member.   Interventions:  Dana Dunlap confirmed she is connected to her new care manager through [employer].  BSW provided email for Angelique Graham ([email or web address]) for future reference.  Dana Dunlap confirmed she has a home visit with her  care manager today at 12pm and an upcoming therapy appt with a new [facility] therapist on 12/17/2023 at [facility].  BSW informed Dana Dunlap he will be ending BSW services at this time since she is connected to her Futures trader. Dana Dunlap understood and was very  grateful for the support.  Laymon Doll, BSW [place]/VBCI - [employer] Social Worker [phone]

## 2023-12-13 NOTE — Patient Instructions (Signed)
 Visit Information  Thank you for taking time to visit with me today.   Tailored Plan Medicaid On October 02, 2022 some people on [place] Medicaid will move to a new kind of Medicaid health plan called a Tailored Plan. Tailored Plans cover your doctor visits, prescription drugs, and health care services.    If your [place] Medicaid will move to a Tailored Plan, you should have gotten a letter and welcome packet. If you're not sure, call your [place] Medicaid Enrollment Broker at [phone] and ask.  Check out these free materials, in [place] and [place], to learn more about your Tailored Plan: Medicaid.[email or web address].Gov/Tailored-Plans/Toolkit  Tailored Care Management Services  TCM services are available to you now. If you are a Tailored Plan member or will be and want information about Tailored Care Management Services including rides to appointments and community and home services, call the Care Management provider for your county of residence:    [facility] [place], [place])  Member Services: [phone] Behavioral Health Crisis Line: [phone], [place], [place], [place], [place])  Member Services: (680)689-1661 Behavioral Health Crisis Line: 319-176-7716     Please call the Suicide and Crisis Lifeline: 988 go to [facility] [address] [place] [phone]) call 911 if you are experiencing a Mental Health or Behavioral Health Crisis or need someone to talk to.  Patient verbalizes understanding of instructions and care plan provided today and agrees to view in MyChart. Active MyChart status and patient understanding of how to access instructions and care plan via MyChart confirmed with patient.     [name], BSW [place]/VBCI - [employer] Social Worker [phone]

## 2023-12-14 ENCOUNTER — Telehealth: Payer: Self-pay | Admitting: Family Medicine

## 2023-12-14 NOTE — Telephone Encounter (Signed)
 I gave this patient a call per her request to have somebody go over her medications and address her concerns regarding the complaints she had filed but could not reach her and I could not leave a message because a voicemail was not set up.

## 2023-12-18 ENCOUNTER — Telehealth (INDEPENDENT_AMBULATORY_CARE_PROVIDER_SITE_OTHER): Payer: Self-pay | Admitting: Primary Care

## 2023-12-18 NOTE — Telephone Encounter (Signed)
 Called pt to reschedule appt. Pt did not answer and could not LVM.

## 2023-12-19 ENCOUNTER — Ambulatory Visit (INDEPENDENT_AMBULATORY_CARE_PROVIDER_SITE_OTHER): Payer: MEDICAID | Admitting: Primary Care

## 2023-12-25 NOTE — Progress Notes (Signed)
 Dana Dunlap MRN: [id number] DOB: 1970-11-09 (age: 53 y.o.)   Initial Visit Referred by:  Self, Referral PCP: No primary care provider on file. Chief Complaint:  Chief Complaint  Patient presents with  . Right Hand - Follow-up    6 weeks f/u bilat CTS  . Left Hand - Follow-up    Vitals:  Vitals:   12/25/23 0835  BP: 131/77  Pulse: 65  Temp: 97.5 F (36.4 C)    HPI   History of Present Illness: Dana Dunlap Follow-up on her right hand status post carpal tunnel injection.  Overall is doing well.  States left hand is still numb and tingling and the trigger on her left middle finger is bothering her again.  This has been injected 1 time previously by [facility] a little over 3 months ago.   Allergies: Allergies[1]  Past Medical History: Medical History[2]  Past Surgical History: Surgical History[3]  Medications: Medications Ordered Prior to Encounter[4]  Family History: Family History[5]  Social History:      Physical Exam:   On exam this is a well-nourished well-developed individual in no acute distress.  They are alert and oriented x3.  Mood and affect are age-appropriate.  They are cooperative with the exam.  They ambulate without difficulty or assistive device.  Head is normocephalic atraumatic.  Neck is supple without meningismus.  Cranial nerves II through XII are grossly intact without deficit.  Skin is warm dry and intact without any signs of infection.  Musculoskeletal examination of the left hand: Overlying skin is warm dry and intact.  There is no signs of rash irritation or infection.  There is no obvious deformity.  No obvious nail deformities.  Capillary refill is brisk and skin turgor is appropriate.  Neurovascular she is intact.  Can make a composite fist when compared bilaterally.  Fingers are warm and pink. Triggering of the left middle finger to palpation over the A1 pulley.  She has positive provocative testing including positive Phalen's  and Durkan's for carpal tunnel.      Special Investigations- Review of Diagnostic Tests:   Radiological Studies: No radiographs taken or reviewed today.  Assessment:  1. Left carpal tunnel syndrome  Amb DME    2. Trigger finger, left middle finger        Plan: I discussed with [name] the nature of her pain.   carpal tunnel right status post injection overall doing very well Carpal tunnel left --injected today and given normal postop bracing protocol Left middle finger trigger injected by me for the first time but second overall.  Will follow-up with me in 4 to 6 weeks for clinical recheck.  Procedure  Hand / Upper Extremity Injection/Arthrocentesis: L long A1 for trigger finger on 12/25/2023 8:15 AM Indications: pain and therapeutic Details: 27 G needle, volar approach Medications: 0.5 mg lidocaine  10 mg/mL (1 %); 20 mg triamcinolone  acetonide 40 mg/mL Outcome: tolerated well, no immediate complications Procedure, treatment alternatives, risks and benefits explained, specific risks discussed. Consent was given by the patient. Immediately prior to procedure a time out was called to verify the correct patient, procedure, equipment, support staff and site/side marked as required. Patient was prepped and draped in the usual sterile fashion.    Hand / Upper Extremity Injection/Arthrocentesis: L carpal tunnel for carpal tunnel syndrome on 12/25/2023 8:15 AM Indications: pain Details: 27 G needle, volar approach Medications: 40 mg triamcinolone  acetonide 40 mg/mL; 1 mg lidocaine  10 mg/mL (1 %) Outcome: tolerated well, no  immediate complications Procedure, treatment alternatives, risks and benefits explained, specific risks discussed. Consent was given by the patient. Immediately prior to procedure a time out was called to verify the correct patient, procedure, equipment, support staff and site/side marked as required. Patient was prepped and draped in the usual sterile  fashion.      Follow up: No follow-ups on file.  Orders Placed This Encounter  Procedures  . Hand / Upper Extremity Injection/Arthrocentesis  . Hand / Upper Extremity Injection/Arthrocentesis  . Amb DME        [1] Allergies Allergen Reactions  . Prochlorperazine Anaphylaxis and Other (See Comments)    Other Reaction(s): Other (See Comments), Other (See Comments)  Seizure  AKA Compzine    Other reaction(s):   Seizure  Seizure      Seizure  Seizure  Other reaction(s): Other (See Comments), Other (See Comments), Other (See Comments)  Seizure  Seizure  Seizure  Seizure  AKA Prozac     Other reaction(s):   Seizure  Seizure  Seizure  Seizure  Other reaction(s): Other (See Comments), Other (See Comments), Other (See Comments)  Seizure  Seizure  Seizure  Seizure  AKA Prozac     Other reaction(s):   Seizure  Seizure  prochlorperazine edisylate  . Tramadol  Hives and Other (See Comments)  [2] Past Medical History: Diagnosis Date  . Anemia   . Anxiety   . Clotting disorder (CMD)   . Depression   . Thrombocytosis   [3] Past Surgical History: Procedure Laterality Date  . KNEE SURGERY Left   [4] Current Outpatient Medications on File Prior to Visit  Medication Sig Dispense Refill  . acetaminophen  (TYLENOL ) 325 mg tablet Take 650 mg by mouth.    . amlodipine -olmesartan  10-20 mg tab Take 1 tablet by mouth daily.    . atorvastatin  (LIPITOR) 10 mg tablet Take 10 mg by mouth daily.    . azelastine  (ASTELIN ) 137 mcg (0.1 %) nasal spray Administer 1 spray into affected nostril(s).    . cycloSPORINE  (RESTASIS ) 0.05 % ophthalmic emulsion Administer 1 drop into both eyes 2 (two) times a day.    . DULoxetine  (CYMBALTA ) 30 mg capsule Take 30 mg by mouth.    . ferrous sulfate  325 mg (65 mg iron ) EC tablet Take 325 mg by mouth.    . fluticasone  propionate (FLONASE ) 50 mcg/spray nasal spray Administer 1 spray into affected nostril(s).    . gabapentin  (NEURONTIN ) 300  mg capsule Take 300 mg by mouth.    . hydroCHLOROthiazide  (HYDRODIURIL ) 25 mg tablet Take 12.5 mg by mouth daily.    . hydrOXYzine  (ATARAX ) 25 mg tablet Take 25 mg by mouth.    . hydrOXYzine  pamoate (VISTARIL ) 25 mg capsule Take by mouth.    . ibuprofen  (MOTRIN ) 400 mg tablet Take 400 mg by mouth.    . prednisoLONE  acetate (PRED FORTE ) 1 % ophthalmic suspension Administer 1 drop into affected eye(s).    . tranexamic acid (LYSTEDA) 650 mg tab Take 1,300 mg by mouth.    . traZODone  (DESYREL ) 50 mg tablet Take 50 mg by mouth at bedtime.    SABRA VITAMIN B COMPLEX ORAL Take 1 capsule by mouth.     No current facility-administered medications on file prior to visit.  [5] Family History Problem Relation Name Age of Onset  . Diabetes Mother    . Hypertension Mother    . Arthritis Mother    . Diabetes Father    . Hypertension Father    . Arthritis Father    .  Hypertension Sister    . Arthritis Sister    . Hypertension Brother

## 2023-12-27 ENCOUNTER — Ambulatory Visit (INDEPENDENT_AMBULATORY_CARE_PROVIDER_SITE_OTHER): Payer: MEDICAID | Admitting: Family Medicine

## 2023-12-27 ENCOUNTER — Other Ambulatory Visit (HOSPITAL_BASED_OUTPATIENT_CLINIC_OR_DEPARTMENT_OTHER): Payer: Self-pay

## 2023-12-27 ENCOUNTER — Ambulatory Visit: Payer: MEDICAID | Admitting: Family Medicine

## 2023-12-27 ENCOUNTER — Encounter: Payer: Self-pay | Admitting: Family Medicine

## 2023-12-27 VITALS — BP 135/77 | HR 64 | Temp 98.6°F | Ht 60.0 in | Wt 185.0 lb

## 2023-12-27 DIAGNOSIS — I10 Essential (primary) hypertension: Secondary | ICD-10-CM | POA: Diagnosis not present

## 2023-12-27 DIAGNOSIS — F329 Major depressive disorder, single episode, unspecified: Secondary | ICD-10-CM | POA: Insufficient documentation

## 2023-12-27 MED ORDER — DULOXETINE HCL 60 MG PO CPEP
60.0000 mg | ORAL_CAPSULE | Freq: Every day | ORAL | 0 refills | Status: DC
Start: 2023-12-27 — End: 2024-01-21
  Filled 2023-12-27 – 2024-01-10 (×2): qty 90, 90d supply, fill #0

## 2023-12-27 MED ORDER — AMLODIPINE-OLMESARTAN 10-20 MG PO TABS
1.0000 | ORAL_TABLET | Freq: Every day | ORAL | 0 refills | Status: DC
  Filled 2023-12-27 – 2024-02-22 (×2): qty 90, 90d supply, fill #0

## 2023-12-27 NOTE — Patient Instructions (Addendum)
 Start weaning the 90 mg Cymbalta  by adding 30 mg every other day to the 60 mg dose prescribed today.   [facility] [address]  [place], [place]  No appointment necessary  Open 24/7.

## 2023-12-27 NOTE — Assessment & Plan Note (Signed)
 Taking amlodipine -olmesartan  10-20 mg daily. Denies chest pain or shortness of breath. Blood pressure is well controlled in the office for non-diabetic patient. Refill sent. Follow-up with PCP in 6 months for medication management.

## 2023-12-27 NOTE — Progress Notes (Addendum)
 Established Patient Office Visit  Subjective   Patient ID: Dana Dunlap, female    DOB: 16-Jan-1971  Age: 53 y.o. MRN: [id number]  Chief Complaint  Patient presents with   Medication Refill    Contraindication of Cymbalta  and Trazadone Nurse Deb at [facility] would not fill her Cymbalta  due to contradiction with Trazadone and patient went a week without Cymbalta  and patient had many bad side effects.    Taking Cymbalta  90 mg daily.  Discussed this is a high dose.  She has been on this dose for a very  long time. She is not happy with previous provider who would not give her Cymbalta  and trazodone  together.  She was not on Cymbalta  for one week before getting 30 tablets prescribed again.  Using trazodone  for sleep. Seeing therapist currently. Stressful life stressors.  HTN:  Taking amlodipine -olmesartan  10-20 mg daily. No chest pain or shortness of breath. Tolerates well.        ROS    Objective:     BP 135/77   Pulse 64   Temp 98.6 F (37 C) (Oral)   Ht 5' (1.524 m)   Wt 185 lb (83.9 kg)   LMP 07/30/2021 (Approximate)   SpO2 97%   BMI 36.13 kg/m    Physical Exam Vitals and nursing note reviewed.  Constitutional:      General: She is not in acute distress.    Appearance: Normal appearance.  Cardiovascular:     Heart sounds: Normal heart sounds.  Pulmonary:     Effort: Pulmonary effort is normal.     Breath sounds: Normal breath sounds.  Skin:    General: Skin is warm and dry.  Neurological:     General: No focal deficit present.     Mental Status: She is alert. Mental status is at baseline.  Psychiatric:        Mood and Affect: Mood normal.        Thought Content: Thought content normal.        Judgment: Judgment normal.       12/27/2023   10:03 AM 11/06/2023    2:12 PM 09/26/2023    9:26 AM 07/27/2020    8:35 AM 07/13/2020    3:51 PM  Depression screen PHQ 2/9  Decreased Interest 3 3 3  0 1  Down, Depressed, Hopeless 3 3 3 3 3   PHQ - 2  Score 6 6 6 3 4   Altered sleeping 3 3 3 3 2   Tired, decreased energy 3 3 3 3 2   Change in appetite 3 3 1 3 2   Feeling bad or failure about yourself  3 0 3 3 1   Trouble concentrating 3 3 0 3 3  Moving slowly or fidgety/restless 0 0 0 0 0  Suicidal thoughts 0 0  0 0  PHQ-9 Score 21 18 16 18 14   Difficult doing work/chores Extremely dIfficult Extremely dIfficult Extremely dIfficult Extremely dIfficult Very difficult     No results found for any visits on 12/27/23.    The 10-year ASCVD risk score (Arnett DK, et al., 2019) is: 5.3%    Assessment & Plan:   Problem List Items Addressed This Visit     Essential hypertension   Taking amlodipine -olmesartan  10-20 mg daily. Denies chest pain or shortness of breath. Blood pressure is well controlled in the office for non-diabetic patient. Refill sent. Follow-up with PCP in 6 months for medication management.        Relevant Medications  amlodipine -olmesartan  (AZOR ) 10-20 MG tablet   Major depressive disorder - Primary   Prescribed Cymbalta  90 mg daily. Discussed this is a high dose. She has been on this dose for a long time. Discussed weaning self down to 60 mg daily. PHQ-9: 21 today No thoughts of self harm. Urgent referral placed to psychiatry today for medication management. Sees therapist currently. Tearful in the office.  Information provided on [facility] for mental health emergencies.  Aware of this resource.        Relevant Medications   DULoxetine  (CYMBALTA ) 60 MG capsule   Other Relevant Orders   Ambulatory referral to Psychiatry  Agrees with plan of care discussed.  Questions answered.  Total time face to face: 40 minutes discussing plan of care.    Return in about 3 months (around 03/27/2024) for depression .    Darice JONELLE Brownie, FNP

## 2023-12-27 NOTE — Assessment & Plan Note (Addendum)
 Prescribed Cymbalta  90 mg daily. Discussed this is a high dose. She has been on this dose for a long time. Discussed weaning self down to 60 mg daily. PHQ-9: 21 today No thoughts of self harm. Urgent referral placed to psychiatry today for medication management. Sees therapist currently. Tearful in the office.  Information provided on [facility] for mental health emergencies.  Aware of this resource.

## 2024-01-07 ENCOUNTER — Other Ambulatory Visit (HOSPITAL_BASED_OUTPATIENT_CLINIC_OR_DEPARTMENT_OTHER): Payer: Self-pay

## 2024-01-10 ENCOUNTER — Other Ambulatory Visit (HOSPITAL_BASED_OUTPATIENT_CLINIC_OR_DEPARTMENT_OTHER): Payer: Self-pay

## 2024-01-10 ENCOUNTER — Other Ambulatory Visit: Payer: Self-pay

## 2024-01-17 ENCOUNTER — Encounter: Payer: Self-pay | Admitting: Oncology

## 2024-01-17 ENCOUNTER — Ambulatory Visit (HOSPITAL_COMMUNITY): Payer: MEDICAID | Admitting: Mental Health

## 2024-01-17 DIAGNOSIS — F4310 Post-traumatic stress disorder, unspecified: Secondary | ICD-10-CM

## 2024-01-17 DIAGNOSIS — F322 Major depressive disorder, single episode, severe without psychotic features: Secondary | ICD-10-CM | POA: Diagnosis not present
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
## 2024-01-21 ENCOUNTER — Encounter (HOSPITAL_COMMUNITY): Payer: Self-pay | Admitting: Psychiatry

## 2024-01-21 ENCOUNTER — Other Ambulatory Visit (HOSPITAL_BASED_OUTPATIENT_CLINIC_OR_DEPARTMENT_OTHER): Payer: Self-pay

## 2024-01-21 ENCOUNTER — Ambulatory Visit (INDEPENDENT_AMBULATORY_CARE_PROVIDER_SITE_OTHER): Payer: MEDICAID | Admitting: Psychiatry

## 2024-01-21 VITALS — BP 170/91 | HR 71 | Wt 185.0 lb

## 2024-01-21 DIAGNOSIS — F329 Major depressive disorder, single episode, unspecified: Secondary | ICD-10-CM

## 2024-01-21 DIAGNOSIS — F22 Delusional disorders: Secondary | ICD-10-CM | POA: Diagnosis not present

## 2024-01-21 DIAGNOSIS — F411 Generalized anxiety disorder: Secondary | ICD-10-CM | POA: Diagnosis not present

## 2024-01-21 MED ORDER — HYDROXYZINE HCL 50 MG PO TABS
50.0000 mg | ORAL_TABLET | Freq: Three times a day (TID) | ORAL | 3 refills | Status: DC
  Filled 2024-01-21: qty 90, 30d supply, fill #0
  Filled 2024-04-07: qty 90, 30d supply, fill #1

## 2024-01-21 MED ORDER — DULOXETINE HCL 60 MG PO CPEP
60.0000 mg | ORAL_CAPSULE | Freq: Every day | ORAL | 3 refills | Status: DC
  Filled 2024-01-21 – 2024-04-07 (×3): qty 30, 30d supply, fill #0

## 2024-01-21 MED ORDER — ARIPIPRAZOLE 5 MG PO TABS
5.0000 mg | ORAL_TABLET | Freq: Every day | ORAL | 3 refills | Status: DC
  Filled 2024-01-21: qty 30, 30d supply, fill #0
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
## 2024-01-22 ENCOUNTER — Other Ambulatory Visit (HOSPITAL_BASED_OUTPATIENT_CLINIC_OR_DEPARTMENT_OTHER): Payer: Self-pay

## 2024-01-22 ENCOUNTER — Other Ambulatory Visit: Payer: Self-pay

## 2024-02-05 ENCOUNTER — Other Ambulatory Visit (HOSPITAL_BASED_OUTPATIENT_CLINIC_OR_DEPARTMENT_OTHER): Payer: Self-pay

## 2024-02-05 MED ORDER — CHLORHEXIDINE GLUCONATE 0.12 % MT SOLN
OROMUCOSAL | 0 refills | Status: AC
  Filled 2024-02-05: qty 473, 16d supply, fill #0

## 2024-02-06 ENCOUNTER — Telehealth (HOSPITAL_COMMUNITY): Payer: Self-pay | Admitting: Mental Health

## 2024-02-06 NOTE — Telephone Encounter (Signed)
 Therapist return call to discuss options for support in community and follow up for behavioral health services. Pt reported in need of support with housing and support with connection to resources in the community. Therapist education on services with [facility] 2nd floor of [facility] building. Therapist also educated on option for CST referral for enhanced services for support with MH and care coordination needs. Pt declined referrals as stated,  I don't know who these people are. I don't want to be triggered. Agrees to follow up with therapist and scheduled for 12/31 @ 1pm

## 2024-02-08 NOTE — Progress Notes (Signed)
 Dana Dunlap MRN: [id number] DOB: 12/24/1970 (age: 53 y.o.)   Initial Visit Referred by:  Self, Referral PCP: No primary care provider on file. Chief Complaint:  Chief Complaint  Patient presents with  . Left Hand - Follow-up    6 weeks f/u L CTS/ LMF trigger s/p injections    Vitals:  Vitals:   02/08/24 1021  BP: 129/77  Pulse: 70  Temp: 97.6 F (36.4 C)    HPI   History of Present Illness: Dana Dunlap follow-up bilateral carpal tunnel injection.  Overall doing very well and very happy with the results.  Allergies: Allergies[1]  Past Medical History: Medical History[2]  Past Surgical History: Surgical History[3]  Medications: Medications Ordered Prior to Encounter[4]  Family History: Family History[5]  Social History:      Physical Exam:   On exam this is a well-nourished well-developed individual in no acute distress.  They are alert and oriented x bilateral hands with no residual numbness or tingling the median 3.  Mood and affect are age-appropriate.  They are cooperative with the exam.  They ambulate without difficulty or assistive device.  Head is normocephalic atraumatic.  Neck is supple without meningismus.  Cranial nerves II through XII are grossly intact without deficit.  Skin is warm dry and intact without any signs of infection.  Musculoskeletal examination of the distribution.  The left wrist from the injection does have a slight bit of skin blanching     Special Investigations- Review of Diagnostic Tests:   Radiological Studies: No radiographs taken or reviewed today.  Assessment:  1. Bilateral carpal tunnel syndrome        Plan: I discussed with Dana Dunlap overall doing significantly better.  She did have a little bit of blanching on the left from the carpal tunnel injection.  I do think this will clear up over the next year.  She is educated this may take 1 year to completely go away and resolved.  Follow-up with us  on  a as needed basis     Procedure    Follow up: No follow-ups on file.  No orders of the defined types were placed in this encounter.       [1] Allergies Allergen Reactions  . Prochlorperazine Anaphylaxis and Other (See Comments)    Other Reaction(s): Other (See Comments), Other (See Comments)  Seizure  AKA Compzine    Other reaction(s):   Seizure  Seizure      Seizure  Seizure  Other reaction(s): Other (See Comments), Other (See Comments), Other (See Comments)  Seizure  Seizure  Seizure  Seizure  AKA Prozac     Other reaction(s):   Seizure  Seizure  Seizure  Seizure  Other reaction(s): Other (See Comments), Other (See Comments), Other (See Comments)  Seizure  Seizure  Seizure  Seizure  AKA Prozac     Other reaction(s):   Seizure  Seizure  prochlorperazine edisylate  . Tramadol  Hives and Other (See Comments)  [2] Past Medical History: Diagnosis Date  . Anemia   . Anxiety   . Clotting disorder   . Depression   . Thrombocytosis   [3] Past Surgical History: Procedure Laterality Date  . KNEE SURGERY Left   [4] Current Outpatient Medications on File Prior to Visit  Medication Sig Dispense Refill  . ARIPiprazole  (ABILIFY ) 5 mg tablet Take 5 mg by mouth daily.    . chlorhexidine (PERIDEX) 0.12 % solution Rinse with 15 ml for 30 seconds two times daily morning and evening.  Use for 2 weeks then discontinue. Spit out do not swallow    . DULoxetine  (CYMBALTA ) 60 mg capsule Take 60 mg by mouth daily.    . hydrOXYzine  (ATARAX ) 50 mg tablet Take 50 mg by mouth 3 (three) times a day.    . acetaminophen  (TYLENOL ) 325 mg tablet Take 650 mg by mouth.    . amlodipine -olmesartan  10-20 mg tab Take 1 tablet by mouth daily.    . atorvastatin  (LIPITOR) 10 mg tablet Take 10 mg by mouth daily.    . azelastine  (ASTELIN ) 137 mcg (0.1 %) nasal spray Administer 1 spray into affected nostril(s).    . cycloSPORINE  (RESTASIS ) 0.05 % ophthalmic emulsion Administer 1 drop  into both eyes 2 (two) times a day.    . ferrous sulfate  325 mg (65 mg iron ) EC tablet Take 325 mg by mouth.    . fluticasone  propionate (FLONASE ) 50 mcg/spray nasal spray Administer 1 spray into affected nostril(s).    . gabapentin  (NEURONTIN ) 300 mg capsule Take 300 mg by mouth.    . hydrOXYzine  pamoate (VISTARIL ) 25 mg capsule Take by mouth.    . ibuprofen  (MOTRIN ) 400 mg tablet Take 400 mg by mouth.    SABRA VITAMIN B COMPLEX ORAL Take 1 capsule by mouth.    . [DISCONTINUED] DULoxetine  (CYMBALTA ) 30 mg capsule Take 30 mg by mouth.    . [DISCONTINUED] hydroCHLOROthiazide  (HYDRODIURIL ) 25 mg tablet Take 12.5 mg by mouth daily.    . [DISCONTINUED] hydrOXYzine  (ATARAX ) 25 mg tablet Take 25 mg by mouth.    . [DISCONTINUED] prednisoLONE  acetate (PRED FORTE ) 1 % ophthalmic suspension Administer 1 drop into affected eye(s).    . [DISCONTINUED] tranexamic acid (LYSTEDA) 650 mg tab Take 1,300 mg by mouth.    . [DISCONTINUED] traZODone  (DESYREL ) 50 mg tablet Take 50 mg by mouth at bedtime.     No current facility-administered medications on file prior to visit.  [5] Family History Problem Relation Name Age of Onset  . Diabetes Mother    . Hypertension Mother    . Arthritis Mother    . Diabetes Father    . Hypertension Father    . Arthritis Father    . Hypertension Sister    . Arthritis Sister    . Hypertension Brother

## 2024-02-18 ENCOUNTER — Telehealth: Payer: Self-pay | Admitting: Family Medicine

## 2024-02-18 NOTE — Telephone Encounter (Signed)
 I returned the call to the patient.  She explained that she had been referred to [facility] for support/resources but was very disappointed when the representative came to her home and offered her options for group homes. She did not feel that her needs were accurately assessed.  She went on to say that she was eventually connected with [facility] and is very pleased with the care that she has received there.    She is still in search of a new PCP.  She had chosen a provider in [place] but would prefer a provider in [place].   She also stated that she was approved for disability and was told that she would be  upgraded  in her Medicaid coverage.  She already has full Medicaid as part of a managed Medicaid plan and I instructed her to find out more about what the  upgrade is referring to.  I also explained that there are situations where an individual is approved for disability and that income puts them over income for Medicaid.  I encouraged her to find out more about the change with her Medicaid so she can anticipate any potential changes with coverage. I also told her to please call me back if she has any further questions.  She voiced understanding and was very appreciative of the assistance I had offered her in the past

## 2024-02-18 NOTE — Telephone Encounter (Signed)
 Copied from CRM #[id number]. Topic: General - Other >> Feb 18, 2024 10:19 AM Tinnie BROCKS wrote:  Reason for CRM: Calling to speak with Dana Dunlap care management coordinator. States she wants to say thank you and provide an update. Requesting call back from her at [phone]

## 2024-02-21 ENCOUNTER — Other Ambulatory Visit (HOSPITAL_BASED_OUTPATIENT_CLINIC_OR_DEPARTMENT_OTHER): Payer: Self-pay | Admitting: Cardiovascular Disease

## 2024-02-22 ENCOUNTER — Other Ambulatory Visit: Payer: Self-pay

## 2024-02-22 ENCOUNTER — Other Ambulatory Visit: Payer: Self-pay | Admitting: Neurology

## 2024-02-22 ENCOUNTER — Other Ambulatory Visit (HOSPITAL_BASED_OUTPATIENT_CLINIC_OR_DEPARTMENT_OTHER): Payer: Self-pay

## 2024-02-22 MED ORDER — ATORVASTATIN CALCIUM 10 MG PO TABS
10.0000 mg | ORAL_TABLET | Freq: Every day | ORAL | 2 refills | Status: AC
  Filled 2024-04-07 – 2024-04-16 (×2): qty 90, 90d supply, fill #0

## 2024-02-22 NOTE — Telephone Encounter (Signed)
 Last seen on 11/06/23 No follow up scheduled

## 2024-03-03 ENCOUNTER — Ambulatory Visit (HOSPITAL_COMMUNITY): Payer: MEDICAID | Admitting: Mental Health

## 2024-03-15 ENCOUNTER — Encounter (HOSPITAL_BASED_OUTPATIENT_CLINIC_OR_DEPARTMENT_OTHER): Payer: Self-pay

## 2024-03-15 ENCOUNTER — Other Ambulatory Visit (HOSPITAL_BASED_OUTPATIENT_CLINIC_OR_DEPARTMENT_OTHER): Payer: Self-pay

## 2024-04-02 ENCOUNTER — Encounter (HOSPITAL_COMMUNITY): Payer: Self-pay

## 2024-04-02 ENCOUNTER — Ambulatory Visit (HOSPITAL_COMMUNITY): Payer: MEDICAID | Admitting: Mental Health

## 2024-04-02 DIAGNOSIS — F22 Delusional disorders: Secondary | ICD-10-CM

## 2024-04-02 DIAGNOSIS — F411 Generalized anxiety disorder: Secondary | ICD-10-CM

## 2024-04-02 DIAGNOSIS — F329 Major depressive disorder, single episode, unspecified: Secondary | ICD-10-CM

## 2024-04-03 NOTE — Progress Notes (Signed)
" ° °  THERAPIST PROGRESS NOTE  Session Time: 10:08 am ( 54 minutes)  Participation Level: Active  Behavioral Response: CasualAlertDysphoric  Type of Therapy: Individual Therapy  Treatment Goals addressed:  STG: People trample over me. Tymia will decrease depressive sxs AEB development of assertive communication with use daily with ability to set appropriate boundaries with others per self report within the next 90 days    STG: People hate me. Toniette will decrease depressive sxs AEB development of x 3 effective emotional regulation skills with ability to reframe distorted thoughts daily within the next 90 days.    ProgressTowards Goals: Initial  Interventions: Supportive  Summary: Dana Dunlap is a 54 y.o. female who presents with dx of major depression, generalized anxiety and paranoia. Presents for session alert and oriented; mood and affect stable. Speech clear and coherent at normal rate and tone. Presents for session dressed appropriate. Tearful at times. Engaged and cooperative. Notes ongoing duress as a result of racism. Shares interactions with family members and interpersonal conflict experienced and lack of feelings support  and inclusion from family. Notes current interpersonal conflict with new roommate, She is Mexican and her boyfriend is over all the time. Notes differences in cleaning and feels she is constantly disrespected by others, they hate me, they trample all over me. They do not respect my boundaries. Notes support of x 2 friendships who often tell her  leave it alone. Notes desire to engage with other Black women who have had shared experiences. Shares would like to work on communication and others respecting her boundaries and decreasing depression. Denies safety concerns. Agrees to treatment plan.   Suicidal/Homicidal: Nowithout intent/plan  Therapist Response: Therapist engaged [place] in therapy session. Reviewed intake session, bounds of confidentiality and  informed consent. Provided safe space to share thoughts and feelings in regard to reports stressors. Explores relationships with family and factors that have contributed to conflict. Explores friendship relationships. Supported in processing feelings, active empathic listening. Explored areas of desired progress and explores what progress would look like. Reviewed txt plan. Reviewed session and provided follow up.   Plan: Return again in  x 5 weeks.  Diagnosis: Major depressive disorder, remission status unspecified, unspecified whether recurrent  Generalized anxiety disorder  Paranoia (HCC)  Collaboration of Care: Other None  Patient/Guardian was advised Release of Information must be obtained prior to any record release in order to collaborate their care with an outside provider. Patient/Guardian was advised if they have not already done so to contact the registration department to sign all necessary forms in order for us  to release information regarding their care.   Consent: Patient/Guardian gives verbal consent for treatment and assignment of benefits for services provided during this visit. Patient/Guardian expressed understanding and agreed to proceed.   Ty Asal [place], [facility] 04/03/2024  "

## 2024-04-07 ENCOUNTER — Other Ambulatory Visit (HOSPITAL_BASED_OUTPATIENT_CLINIC_OR_DEPARTMENT_OTHER): Payer: Self-pay

## 2024-04-07 ENCOUNTER — Other Ambulatory Visit: Payer: Self-pay | Admitting: Neurology

## 2024-04-07 ENCOUNTER — Encounter: Payer: Self-pay | Admitting: Family Medicine

## 2024-04-07 ENCOUNTER — Other Ambulatory Visit: Payer: Self-pay

## 2024-04-07 VITALS — BP 126/80 | HR 61 | Temp 98.1°F | Ht 60.0 in | Wt 181.2 lb

## 2024-04-07 DIAGNOSIS — R0981 Nasal congestion: Secondary | ICD-10-CM

## 2024-04-07 DIAGNOSIS — J0100 Acute maxillary sinusitis, unspecified: Secondary | ICD-10-CM | POA: Diagnosis not present

## 2024-04-07 DIAGNOSIS — I10 Essential (primary) hypertension: Secondary | ICD-10-CM

## 2024-04-07 MED ORDER — AMOXICILLIN-POT CLAVULANATE 875-125 MG PO TABS
1.0000 | ORAL_TABLET | Freq: Two times a day (BID) | ORAL | 0 refills | Status: DC
  Filled 2024-04-07: qty 20, 10d supply, fill #0

## 2024-04-07 MED ORDER — GABAPENTIN 300 MG PO CAPS
300.0000 mg | ORAL_CAPSULE | Freq: Every day | ORAL | 1 refills | Status: DC
  Filled 2024-04-07: qty 30, 30d supply, fill #0

## 2024-04-07 MED ORDER — FLUTICASONE PROPIONATE 50 MCG/ACT NA SUSP
2.0000 | Freq: Every day | NASAL | 1 refills | Status: AC
  Filled 2024-04-07: qty 16, 30d supply, fill #0

## 2024-04-07 NOTE — Assessment & Plan Note (Addendum)
 Reports viral illness 2 months ago without total resolution in her symptoms. There is nasal congestion and maxillary pressure with palpation. Symptoms are consistent with sinusitis. Augmentin  875-125 mg BID for 10 days. Saline sinus rinses. Flonase  nasal spray as instructed. Follow-up with PCP if symptoms do not resolve.

## 2024-04-07 NOTE — Progress Notes (Signed)
 "  Acute Office Visit  Subjective:     Patient ID: Dana Dunlap, female    DOB: Sep 12, 1970, 54 y.o.   MRN: [id number]  Chief Complaint  Patient presents with   Sinusitis    HPI Patient is in today for coughing,  yellow sputum, throat scratchiness, sinus drip. Eyes are watering. Nasal congestion. Symptoms started 2 months ago. TheraFlu is not helping. No fever. Endorses getting sick 2 months ago and has never fully recovered.      ROS      Objective:    BP 126/80 (BP Location: Left Arm, Patient Position: Sitting, Cuff Size: Normal)   Pulse 61   Temp 98.1 F (36.7 C) (Oral)   Ht 5' (1.524 m)   Wt 181 lb 3.2 oz (82.2 kg)   LMP 07/30/2021   SpO2 99%   BMI 35.39 kg/m    Physical Exam Vitals and nursing note reviewed.  Constitutional:      General: She is not in acute distress.    Appearance: Normal appearance. She is not toxic-appearing.  HENT:     Right Ear: Tympanic membrane normal.     Left Ear: Tympanic membrane normal.     Nose:     Right Sinus: Maxillary sinus tenderness present. No frontal sinus tenderness.     Left Sinus: Maxillary sinus tenderness present. No frontal sinus tenderness.     Mouth/Throat:     Pharynx: Uvula midline. Posterior oropharyngeal erythema present. No oropharyngeal exudate.  Cardiovascular:     Rate and Rhythm: Normal rate.  Pulmonary:     Effort: Pulmonary effort is normal.     Breath sounds: Normal breath sounds.  Skin:    General: Skin is warm and dry.  Neurological:     General: No focal deficit present.     Mental Status: She is alert. Mental status is at baseline.  Psychiatric:        Mood and Affect: Mood normal.        Behavior: Behavior normal.        Thought Content: Thought content normal.        Judgment: Judgment normal.     No results found for any visits on 04/07/24.      Assessment & Plan:   Problem List Items Addressed This Visit     Nasal congestion   Relevant Medications   fluticasone   (FLONASE ) 50 MCG/ACT nasal spray   Acute non-recurrent maxillary sinusitis - Primary   Reports viral illness 2 months ago without total resolution in her symptoms. There is nasal congestion and maxillary pressure with palpation. Symptoms are consistent with sinusitis. Augmentin  875-125 mg BID for 10 days. Saline sinus rinses. Flonase  nasal spray as instructed. Follow-up with PCP if symptoms do not resolve.        Relevant Medications   amoxicillin -clavulanate (AUGMENTIN ) 875-125 MG tablet   fluticasone  (FLONASE ) 50 MCG/ACT nasal spray    Meds ordered this encounter  Medications   amoxicillin -clavulanate (AUGMENTIN ) 875-125 MG tablet    Sig: Take 1 tablet by mouth 2 (two) times daily.    Dispense:  20 tablet    Refill:  0    Supervising Provider:   METHENEY, CATHERINE D [[id number]]   fluticasone  (FLONASE ) 50 MCG/ACT nasal spray    Sig: Place 2 sprays into both nostrils daily.    Dispense:  16 g    Refill:  1    Supervising Provider:   METHENEY, CATHERINE D [[id number]]  Agrees with plan of care  discussed.  Questions answered.   Return if symptoms worsen or fail to improve.  Darice JONELLE Brownie, FNP   "

## 2024-04-08 ENCOUNTER — Ambulatory Visit: Payer: MEDICAID | Admitting: Family Medicine

## 2024-04-08 ENCOUNTER — Encounter (HOSPITAL_COMMUNITY): Payer: Self-pay | Admitting: Psychiatry

## 2024-04-08 ENCOUNTER — Other Ambulatory Visit: Payer: Self-pay

## 2024-04-08 ENCOUNTER — Ambulatory Visit (INDEPENDENT_AMBULATORY_CARE_PROVIDER_SITE_OTHER): Payer: MEDICAID | Admitting: Psychiatry

## 2024-04-08 ENCOUNTER — Other Ambulatory Visit (HOSPITAL_BASED_OUTPATIENT_CLINIC_OR_DEPARTMENT_OTHER): Payer: Self-pay

## 2024-04-08 DIAGNOSIS — F411 Generalized anxiety disorder: Secondary | ICD-10-CM

## 2024-04-08 DIAGNOSIS — F329 Major depressive disorder, single episode, unspecified: Secondary | ICD-10-CM

## 2024-04-08 MED ORDER — DULOXETINE HCL 60 MG PO CPEP
60.0000 mg | ORAL_CAPSULE | Freq: Every day | ORAL | 3 refills | Status: DC
  Filled 2024-04-16: qty 30, 30d supply, fill #0

## 2024-04-08 MED ORDER — AMLODIPINE-OLMESARTAN 10-20 MG PO TABS
1.0000 | ORAL_TABLET | Freq: Every day | ORAL | 0 refills | Status: AC
  Filled 2024-04-08: qty 90, 90d supply, fill #0

## 2024-04-08 MED ORDER — RISPERIDONE 0.5 MG PO TABS
0.5000 mg | ORAL_TABLET | Freq: Two times a day (BID) | ORAL | 3 refills | Status: AC
  Filled 2024-04-08: qty 60, 30d supply, fill #0

## 2024-04-08 MED ORDER — HYDROXYZINE HCL 50 MG PO TABS: 50.0000 mg | ORAL_TABLET | Freq: Three times a day (TID) | ORAL | 3 refills | Status: DC

## 2024-04-08 NOTE — Progress Notes (Signed)
 [facility] MD/PA/NP OP Progress Note  04/08/2024 1:11 PM Dana Dunlap  MRN:  [id number]  Chief Complaint:  Abilify  made me feel slow so I stopped it  HPI: 54 year old female seen today for follow-up psychiatric evaluation.  She has a psychiatric history of depression, paranoia, anxiety, and Pica.  Currently she is managed on Abilify  5 mg daily, hydroxyzine  50 times daily, and Cymbalta  60 mg.  Patient is also prescribed gabapentin  300 mg daily which she received from her PCP.  She informed clinical research associate that her medications are somewhat effective in managing her psychiatric conditions but notes that she discontinued Abilify .  Today she is well-groomed, pleasant, cooperative, and somewhat engaged in conversation.  Patient is tearful throughout the exam.  Patient informed writer that she discontinued Abilify  because it made her feel slow.  She also describes feeling dizzy.  Patient continues to note that she has racing thoughts, increased depression, and mild irritability.  She denies other symptoms of mania but does ruminates on feeling that racism ruined her life.  She notes that she is learning to stay in her place and often sees herself as a slave who is not free from racism.  Patient describes losing her job at [employer], micro-aggression caused by her white counterparts and fearing doctors.  Patient also notes that she is saddened because she is not as successful as she feels that she needs to be.  She notes that she is college-educated and finds that financially she is not in the right place.  She reports that she feels guilty that she is not able to financially support for her elderly parents.  Patient notes that she is concerned about her father who is dealing with health illnesses but will not share with her ailments he has.  She also notes that recently she and her sister has not been getting along.  She describes her sister as being hateful.  Patient reports that she does not feel heard by her mother.  Patient  also concerned about her housing.  She currently lives with a Hispanic roommate and notes that they do not talk because she feels disrespected by her.  Patient notes that the above exacerbates her anxiety and depression.Today provider conducted a GAD-7 and patient scored an 20.  Provider also conducted PHQ-9 and patient scored an 15.  Patient notes that her sleep and poor.  Today she denies SI/HI/AVH.  She reports that her appetite is adequate but notes that she has gained a good amount of weight.  At this time Abilify  not restarted.  Patient agreeable to starting Risperdal  0.5 mg twice daily to help manage mood, depression, anxiety.  She will continue her other medications as prescribed.Potential side effects of medication and risks vs benefits of treatment vs non-treatment were explained and discussed. All questions were answered.  Patient will follow-up with outpatient counseling for therapy.  No other concerns at this time.  Visit Diagnosis:    ICD-10-CM   1. Generalized anxiety disorder  F41.1 risperiDONE  (RISPERDAL ) 0.5 MG tablet    hydrOXYzine  (ATARAX ) 50 MG tablet    2. Major depressive disorder, remission status unspecified, unspecified whether recurrent  F32.9 risperiDONE  (RISPERDAL ) 0.5 MG tablet    DULoxetine  (CYMBALTA ) 60 MG capsule      Past Psychiatric History: Paranoia, anxiety, depression,pica   Past Medical History:  Past Medical History:  Diagnosis Date   Anemia    Depression    Currently on Cymbalta    Eye abnormalities    Fibroids    Frequent  headaches    Worse during monthly menstrual cycle   Hypertension     Past Surgical History:  Procedure Laterality Date   BREAST MASS EXCISION     benign   KNEE SURGERY      Family Psychiatric History: Father alcohol use and mother potential undiagnosed mental health issues   Family History:  Family History  Problem Relation Age of Onset   Hypertension Mother    Hyperlipidemia Mother    Stroke Mother    Diabetes  Father    Cancer Father        Prostate cancer   Hypertension Father    Hyperlipidemia Father    Coronary artery disease Father    Fibroids Sister    Sickle cell trait Sister    Fibroids Sister    Sickle cell trait Brother    Stroke Paternal Aunt    Diabetes Paternal Aunt    Sickle cell trait Other    Thalassemia Other     Social History:  Social History   Socioeconomic History   Marital status: Single    Spouse name: Not on file   Number of children: 0   Years of education: 16   Highest education level: Bachelor's degree (e.g., BA, AB, BS)  Occupational History   Occupation: Retail Buyer: [employer]    Comment: Molecular biology  Tobacco Use   Smoking status: Never    Passive exposure: Never   Smokeless tobacco: Never  Vaping Use   Vaping status: Never Used  Substance and Sexual Activity   Alcohol use: Yes    Alcohol/week: 1.0 standard drink of alcohol    Types: 1 Glasses of wine per week   Drug use: No   Sexual activity: Yes    Birth control/protection: Condom  Other Topics Concern   Not on file  Social History Narrative   Dana Dunlap is originally from [place]. She attended [employer] in [place], [place] where she obtained her Scientist, Water Quality in Biology in 1995. 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 She is in a long term relationship with her boyfried, Dana Dunlap. They have been together for 6 years. [name] transferred to [place]  with [employer] in January. She enjoys reading and she enjoys the outdoors. She loves to travel. 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   Social Drivers of Health   Tobacco Use: Low Risk (04/07/2024)   Patient History    Smoking Tobacco Use: Never    Smokeless Tobacco Use: Never    Passive Exposure: Never  Financial Resource Strain: High Risk (04/05/2024)   Overall Financial Resource Strain (CARDIA)    Difficulty of Paying Living Expenses: Very  hard  Food Insecurity: Food Insecurity Present (04/05/2024)   Epic    Worried About Programme Researcher, Broadcasting/film/video in the Last Year: Sometimes true    Ran Out of Food in the Last Year: Sometimes true  Transportation Needs: No Transportation Needs (04/05/2024)   Epic    Lack of Transportation (Medical): No    Lack of Transportation (Non-Medical): No  Physical Activity: Insufficiently Active (04/05/2024)   Exercise Vital Sign    Days of Exercise per Week: 2 days    Minutes of Exercise per Session: 30 min  Stress: Stress Concern Present (04/05/2024)   [employer] of Occupational Health - Occupational Stress Questionnaire    Feeling of Stress: Very much  Social Connections: Moderately Integrated (  04/05/2024)   Social Connection and Isolation Panel    Frequency of Communication with Friends and Family: More than three times a week    Frequency of Social Gatherings with Friends and Family: Once a week    Attends Religious Services: 1 to 4 times per year    Active Member of Clubs or Organizations: Yes    Attends Banker Meetings: 1 to 4 times per year    Marital Status: Never married  Depression (PHQ2-9): High Risk (04/07/2024)   Depression (PHQ2-9)    PHQ-2 Score: 18  Alcohol Screen: Low Risk (04/05/2024)   Alcohol Screen    Last Alcohol Screening Score (AUDIT): 0  Housing: Unknown (04/05/2024)   Epic    Unable to Pay for Housing in the Last Year: No    Number of Times Moved in the Last Year: Not on file    Homeless in the Last Year: No  Utilities: Low Risk (11/16/2023)   Received from [facility]   Utilities    In the past 12 months has the electric, gas, oil, or water company threatened to shut off services in your home? : No  Health Literacy: Medium Risk (04/25/2022)   Received from [facility] Literacy    How often do you need to have someone help you when you read instructions, pamphlets, or other written material from your doctor or pharmacy?: Rarely    Allergies:  Allergies[1]  Metabolic Disorder Labs: Lab Results  Component Value Date   HGBA1C 6.2 (H) 09/26/2023   No results found for: PROLACTIN Lab Results  Component Value Date   CHOL 216 (H) 01/26/2021   TRIG 50 01/26/2021   HDL 74 01/26/2021   CHOLHDL 2.9 01/26/2021   VLDL 89.1 03/21/2016   LDLCALC 133 (H) 01/26/2021   LDLCALC 229 (H) 10/28/2020   Lab Results  Component Value Date   TSH 1.600 12/10/2018   TSH 3.40 06/11/2017    Therapeutic Level Labs: No results found for: LITHIUM No results found for: VALPROATE No results found for: CBMZ  Current Medications: Current Outpatient Medications  Medication Sig Dispense Refill   risperiDONE  (RISPERDAL ) 0.5 MG tablet Take 1 tablet (0.5 mg total) by mouth 2 (two) times daily. 60 tablet 3   acetaminophen  (TYLENOL ) 325 MG tablet Take 650 mg by mouth every 6 (six) hours as needed for mild pain (pain score 1-3). Patient taking twice weekly     amlodipine -olmesartan  (AZOR ) 10-20 MG tablet Take 1 tablet by mouth daily. 90 tablet 0   amoxicillin -clavulanate (AUGMENTIN ) 875-125 MG tablet Take 1 tablet by mouth 2 (two) times daily. 20 tablet 0   atorvastatin  (LIPITOR) 10 MG tablet Take 1 tablet (10 mg total) by mouth daily. 90 tablet 2   azelastine  (ASTELIN ) 0.1 % nasal spray Place 1 spray into both nostrils 2 (two) times daily as needed. (Patient not taking: Reported on 04/07/2024) 30 mL 0   chlorhexidine  (PERIDEX ) 0.12 % solution Rinse with 15 ml for 30 seconds two times daily morning and evening.  Use for 2 weeks then discontinue. Spit out do not swallow (Patient not taking: Reported on 04/07/2024) 473 mL 0   cycloSPORINE  (RESTASIS ) 0.05 % ophthalmic emulsion Place 1 drop into both eyes 2 (two) times daily. 120 each 4   DULoxetine  (CYMBALTA ) 60 MG capsule Take 1 capsule (60 mg total) by mouth daily. 30 capsule 3   fluticasone  (FLONASE ) 50 MCG/ACT nasal spray Place 2 sprays into both nostrils daily. 16 g  1   gabapentin  (NEURONTIN ) 300 MG  capsule Take 1 capsule (300 mg total) by mouth daily. 30 capsule 1   hydrOXYzine  (ATARAX ) 50 MG tablet Take 1 tablet (50 mg total) by mouth 3 (three) times daily. 90 tablet 3   ibuprofen  (ADVIL ) 400 MG tablet Take 1 tablet (400 mg total) by mouth 3 (three) times daily as needed. 15 tablet 0   sodium chloride  (OCEAN) 0.65 % SOLN nasal spray Place 1 spray into both nostrils as needed for congestion. 30 mL 0   No current facility-administered medications for this visit.     Musculoskeletal: Strength & Muscle Tone: within normal limits Gait & Station: normal Patient leans: N/A  Psychiatric Specialty Exam: Review of Systems  Last menstrual period 07/30/2021.There is no height or weight on file to calculate BMI.  General Appearance: Well Groomed  Eye Contact:  Good  Speech:  Clear and Coherent and Normal Rate  Volume:  Normal  Mood:  Anxious and Depressed  Affect:  Appropriate and Congruent  Thought Process:  Coherent, Goal Directed, and Linear  Orientation:  Full (Time, Place, and Person)  Thought Content: Paranoid Ideation and Rumination   Suicidal Thoughts:  No  Homicidal Thoughts:  No  Memory:  Immediate;   Good Recent;   Good Remote;   Good  Judgement:  Fair  Insight:  Good  Psychomotor Activity:  Normal  Concentration:  Concentration: Good and Attention Span: Good  Recall:  Good  Fund of Knowledge: Good  Language: Good  Akathisia:  No  Handed:  Right  AIMS (if indicated): not done  Assets:  Communication Skills Desire for Improvement Financial Resources/Insurance Housing Intimacy Physical Health Social Support Transportation  ADL's:  Intact  Cognition: WNL  Sleep:  Fair   Screenings: GAD-7    Loss Adjuster, Chartered Office Visit from 04/07/2024 in [facility] Visit from 01/21/2024 in [facility] Counselor from 01/17/2024 in [facility] Office Visit from 12/27/2023 in [facility] at [facility] Office Visit from 11/06/2023 in [facility]  Total GAD-7 Score 18 13 18 15 18    PHQ2-9    Flowsheet Row Office Visit from 04/07/2024 in [facility] at [facility] Office Visit from 01[facility]21/2024 in [facility] Counselor from 01/17/2024 in [facility] Office Visit from 12/27/2023 in [facility] at [facility] Office Visit from 11/06/2023 in [facility]  PHQ-2 Total Score 6 6 6 6 6   PHQ-9 Total Score 18 15 21 21 18    Flowsheet Row Counselor from 01/17/2024 in [facility] ED from 08/04/2023 in [facility] Department at [facility] ED from 02/21/2023 in [facility] Emergency Department at [facility]  C-SSRS RISK CATEGORY No Risk No Risk No Risk     Assessment and Plan: Patient continues endorse paranoia, anxiety, depression.  She discontinued Abilify  as she reports that it made her feel slow.  Today she is agreeable to starting Risperdal  0.5 mg twice daily.  She will continue medications as prescribed.  1. Generalized anxiety disorder  Start- risperiDONE  (RISPERDAL ) 0.5 MG tablet; Take 1 tablet (0.5 mg total) by mouth 2 (two) times daily.  Dispense: 60 tablet; Refill: 3 Continue- hydrOXYzine  (ATARAX ) 50 MG tablet; Take 1 tablet (50 mg total) by mouth 3 (three) times daily.  Dispense: 90 tablet; Refill: 3  2.  Major depressive disorder, remission status unspecified, unspecified whether recurrent  Start- risperiDONE  (RISPERDAL ) 0.5 MG tablet; Take 1 tablet (0.5 mg total) by mouth 2 (two) times daily.  Dispense: 60 tablet; Refill: 3 Continue- DULoxetine  (CYMBALTA ) 60 MG capsule; Take 1 capsule (60 mg total) by mouth daily.  Dispense: 30 capsule; Refill: 3   Collaboration of Care: Collaboration of Care: Other provider involved in patient's care AEB PCP and  counselor  Patient/Guardian was advised Release of Information must be obtained prior to any record release in order to collaborate their care with an outside provider. Patient/Guardian was advised if they have not already done so to contact the registration department to sign all necessary forms in order for us  to release information regarding their care.   Consent: Patient/Guardian gives verbal consent for treatment and assignment of benefits for services provided during this visit. Patient/Guardian expressed understanding and agreed to proceed.   Follow-up in 2.5 months Follow-up with therapy Zane FORBES Bach, NP 04/08/2024, 1:11 PM       [1]  Allergies Allergen Reactions   Other     Sneezing and red eyes  Other Reaction(s): seizure   Prochlorperazine Anaphylaxis and Other (See Comments)    Seizure  Other reaction(s): Other (See Comments), Other (See Comments), Other (See Comments)  AKA Prozac   Other reaction(s):   Other Reaction(s): Seizure  Other Reaction(s): Other (See Comments), Other (See Comments)    Seizure    AKA Compzine  Other reaction(s):  Seizure Seizure   Seizure Seizure Other reaction(s): Other (See Comments), Other (See Comments), Other (See Comments) Seizure Seizure Seizure Seizure AKA Prozac   Other reaction(s):  Seizure Seizure    Seizure Seizure Other reaction(s): Other (See Comments), Other (See Comments), Other (See Comments) Seizure Seizure Seizure Seizure AKA Prozac   Other reaction(s):  Seizure Seizure    prochlorperazine edisylate   Tramadol  Hives and Other (See Comments)   Compazine [Prochlorperazine Edisylate] Other (See Comments)    Seizure

## 2024-04-09 ENCOUNTER — Other Ambulatory Visit (HOSPITAL_BASED_OUTPATIENT_CLINIC_OR_DEPARTMENT_OTHER): Payer: Self-pay

## 2024-04-16 ENCOUNTER — Other Ambulatory Visit (HOSPITAL_BASED_OUTPATIENT_CLINIC_OR_DEPARTMENT_OTHER): Payer: Self-pay

## 2024-04-17 ENCOUNTER — Other Ambulatory Visit (HOSPITAL_BASED_OUTPATIENT_CLINIC_OR_DEPARTMENT_OTHER): Payer: Self-pay

## 2024-04-17 ENCOUNTER — Telehealth: Payer: Self-pay

## 2024-04-17 DIAGNOSIS — F329 Major depressive disorder, single episode, unspecified: Secondary | ICD-10-CM

## 2024-04-17 DIAGNOSIS — F411 Generalized anxiety disorder: Secondary | ICD-10-CM

## 2024-04-17 DIAGNOSIS — I10 Essential (primary) hypertension: Secondary | ICD-10-CM

## 2024-04-17 MED ORDER — HYDROXYZINE HCL 50 MG PO TABS
50.0000 mg | ORAL_TABLET | Freq: Three times a day (TID) | ORAL | 0 refills | Status: AC
  Filled 2024-04-17: qty 90, 30d supply, fill #0

## 2024-04-17 MED ORDER — DULOXETINE HCL 60 MG PO CPEP
60.0000 mg | ORAL_CAPSULE | Freq: Every day | ORAL | 0 refills | Status: AC
  Filled 2024-04-17: qty 90, 90d supply, fill #0

## 2024-04-17 MED ORDER — GABAPENTIN 300 MG PO CAPS
300.0000 mg | ORAL_CAPSULE | Freq: Every day | ORAL | 0 refills | Status: AC
  Filled 2024-04-17: qty 90, 90d supply, fill #0

## 2024-04-17 NOTE — Telephone Encounter (Signed)
 Copied from CRM [id number]. Topic: Clinical - Prescription Issue >> Apr 16, 2024 12:04 PM Rosaria BRAVO wrote: Reason for CRM: Pt called reporting that she has an issue with her medications. She wants to have a 90 day supply instead of a 30 day supply. She says she feels like she is constantly driving back and forth to the pharmacy. Please advise, she is requesting this for all of her medications.   [facility] Davene Salome Bernerd Venson [address] [place] [address] Phone: [phone] Fax: [phone]

## 2024-04-17 NOTE — Telephone Encounter (Addendum)
 Contacted patient and advised of 90-day prescriptions sent to preferred pharmacy. I have also advised patient she will no longer be able to receive 30-day refill from previous prescriptions. Dana [employer]lap stated comprehension. Advised RISPERIDONE  will have to be changed through NP Harl; pt stated understanding

## 2024-04-28 ENCOUNTER — Other Ambulatory Visit (HOSPITAL_BASED_OUTPATIENT_CLINIC_OR_DEPARTMENT_OTHER): Payer: Self-pay

## 2024-04-28 ENCOUNTER — Encounter: Payer: Self-pay | Admitting: Family Medicine

## 2024-04-28 ENCOUNTER — Telehealth: Payer: MEDICAID | Admitting: Family Medicine

## 2024-04-28 DIAGNOSIS — F419 Anxiety disorder, unspecified: Secondary | ICD-10-CM

## 2024-04-28 DIAGNOSIS — F32A Depression, unspecified: Secondary | ICD-10-CM

## 2024-04-28 DIAGNOSIS — R0981 Nasal congestion: Secondary | ICD-10-CM | POA: Diagnosis not present

## 2024-04-28 MED ORDER — MONTELUKAST SODIUM 10 MG PO TABS
10.0000 mg | ORAL_TABLET | Freq: Every day | ORAL | 3 refills | Status: AC
  Filled 2024-04-28: qty 30, 30d supply, fill #0

## 2024-04-28 NOTE — Progress Notes (Signed)
 Virtual Visit via Video Note  I connected with Dana Dunlap on 04/28/24 at  3:30 PM EST by a video enabled telemedicine application and verified that I am speaking with the correct person using two identifiers.  Location: Patient: home in [place] Provider: Home in [place]   I discussed the limitations of evaluation and management by telemedicine and the availability of in person appointments. The patient expressed understanding and agreed to proceed.  History of Present Illness: Depression Pt is here for follow up. She is seeing BH and is taking Cymbalta  60mg  daily, Hydroxyzine  50mg  TID, and Risperidal 0.5mg  BID. She is doing well with counseling and her psychiatrist.  She has been seen for sinusitis in January 2026. She was given antibiotic. She says this improved her symptoms but she has lingering nasal congestion despite using Flonase .    Observations/Objective: Physical Exam Nursing note reviewed.  Constitutional:      Appearance: Normal appearance. She is obese.  HENT:     Head: Normocephalic and atraumatic.     Right Ear: External ear normal.     Left Ear: External ear normal.     Nose: Nose normal.  Eyes:     Extraocular Movements: Extraocular movements intact.  Pulmonary:     Effort: Pulmonary effort is normal.  Neurological:     General: No focal deficit present.     Mental Status: She is alert and oriented to person, place, and time. Mental status is at baseline.  Psychiatric:        Mood and Affect: Mood normal.        Behavior: Behavior normal.        Thought Content: Thought content normal.        Judgment: Judgment normal.     Assessment and Plan: Anxiety and depression  Nasal congestion -     Montelukast  Sodium; Take 1 tablet (10 mg total) by mouth at bedtime.  Dispense: 30 tablet; Refill: 3     Follow Up Instructions: Pt with depression/anxiety. To continue Cymbalta  60mg  and Hydroxyzine  50mg  TID prn for now. She has routine follow up with psychiatrist and  counselor.  For lingering nasal congestion, continue Flonase  and will add Singulair  10mg  nightly. If no better in the next few weeks, refer to ENT.   I discussed the assessment and treatment plan with the patient. The patient was provided an opportunity to ask questions and all were answered. The patient agreed with the plan and demonstrated an understanding of the instructions.   The patient was advised to call back or seek an in-person evaluation if the symptoms worsen or if the condition fails to improve as anticipated.  I provided 8 minutes of non-face-to-face time during this encounter.   Torrence CINDERELLA Barrier, MD

## 2024-04-30 ENCOUNTER — Other Ambulatory Visit (HOSPITAL_BASED_OUTPATIENT_CLINIC_OR_DEPARTMENT_OTHER): Payer: Self-pay

## 2024-05-12 ENCOUNTER — Ambulatory Visit: Payer: MEDICAID | Admitting: Family Medicine

## 2024-05-14 ENCOUNTER — Ambulatory Visit (HOSPITAL_COMMUNITY): Payer: MEDICAID | Admitting: Mental Health

## 2024-06-19 ENCOUNTER — Encounter (HOSPITAL_COMMUNITY): Payer: MEDICAID | Admitting: Psychiatry
